# Patient Record
Sex: Male | Born: 1960
Health system: Southern US, Community
[De-identification: ages and names within clinical notes are randomized; demographics above are authoritative.]

## PROBLEM LIST (undated history)

## (undated) DIAGNOSIS — S0590XA Unspecified injury of unspecified eye and orbit, initial encounter: Secondary | ICD-10-CM

## (undated) DIAGNOSIS — M1990 Unspecified osteoarthritis, unspecified site: Secondary | ICD-10-CM

## (undated) DIAGNOSIS — I2510 Atherosclerotic heart disease of native coronary artery without angina pectoris: Secondary | ICD-10-CM

## (undated) DIAGNOSIS — E119 Type 2 diabetes mellitus without complications: Secondary | ICD-10-CM

## (undated) DIAGNOSIS — F1910 Other psychoactive substance abuse, uncomplicated: Secondary | ICD-10-CM

## (undated) DIAGNOSIS — N183 Chronic kidney disease, stage 3 unspecified: Secondary | ICD-10-CM

## (undated) DIAGNOSIS — Z8639 Personal history of other endocrine, nutritional and metabolic disease: Secondary | ICD-10-CM

## (undated) DIAGNOSIS — R001 Bradycardia, unspecified: Secondary | ICD-10-CM

## (undated) DIAGNOSIS — I499 Cardiac arrhythmia, unspecified: Secondary | ICD-10-CM

## (undated) DIAGNOSIS — I10 Essential (primary) hypertension: Secondary | ICD-10-CM

## (undated) DIAGNOSIS — E785 Hyperlipidemia, unspecified: Secondary | ICD-10-CM

## (undated) DIAGNOSIS — C801 Malignant (primary) neoplasm, unspecified: Secondary | ICD-10-CM

## (undated) DIAGNOSIS — Z9081 Acquired absence of spleen: Secondary | ICD-10-CM

## (undated) DIAGNOSIS — Z9221 Personal history of antineoplastic chemotherapy: Secondary | ICD-10-CM

## (undated) DIAGNOSIS — G510 Bell's palsy: Secondary | ICD-10-CM

## (undated) DIAGNOSIS — G709 Myoneural disorder, unspecified: Secondary | ICD-10-CM

## (undated) DIAGNOSIS — G629 Polyneuropathy, unspecified: Secondary | ICD-10-CM

## (undated) HISTORY — PX: EYE SURGERY: SHX253

---

## 1999-05-02 ENCOUNTER — Emergency Department (HOSPITAL_COMMUNITY): Admission: EM | Admit: 1999-05-02 | Discharge: 1999-05-02 | Payer: Self-pay | Admitting: Emergency Medicine

## 1999-08-03 ENCOUNTER — Emergency Department (HOSPITAL_COMMUNITY): Admission: EM | Admit: 1999-08-03 | Discharge: 1999-08-03 | Payer: Self-pay | Admitting: Emergency Medicine

## 2000-12-16 ENCOUNTER — Emergency Department (HOSPITAL_COMMUNITY): Admission: EM | Admit: 2000-12-16 | Discharge: 2000-12-16 | Payer: Self-pay | Admitting: Emergency Medicine

## 2002-07-04 ENCOUNTER — Encounter: Payer: Self-pay | Admitting: Emergency Medicine

## 2002-07-04 ENCOUNTER — Emergency Department (HOSPITAL_COMMUNITY): Admission: EM | Admit: 2002-07-04 | Discharge: 2002-07-04 | Payer: Self-pay | Admitting: Emergency Medicine

## 2006-02-06 ENCOUNTER — Inpatient Hospital Stay (HOSPITAL_COMMUNITY): Admission: AD | Admit: 2006-02-06 | Discharge: 2006-02-09 | Payer: Self-pay | Admitting: Internal Medicine

## 2006-02-07 ENCOUNTER — Encounter (INDEPENDENT_AMBULATORY_CARE_PROVIDER_SITE_OTHER): Payer: Self-pay | Admitting: Cardiology

## 2008-08-16 ENCOUNTER — Ambulatory Visit: Payer: Self-pay | Admitting: Internal Medicine

## 2008-08-16 ENCOUNTER — Inpatient Hospital Stay (HOSPITAL_COMMUNITY): Admission: EM | Admit: 2008-08-16 | Discharge: 2008-08-19 | Payer: Self-pay | Admitting: Family Medicine

## 2010-02-14 ENCOUNTER — Emergency Department (HOSPITAL_COMMUNITY): Admission: EM | Admit: 2010-02-14 | Discharge: 2010-02-15 | Payer: Self-pay | Admitting: Emergency Medicine

## 2010-03-01 ENCOUNTER — Encounter (HOSPITAL_BASED_OUTPATIENT_CLINIC_OR_DEPARTMENT_OTHER): Admission: RE | Admit: 2010-03-01 | Discharge: 2010-05-30 | Payer: Self-pay | Admitting: Internal Medicine

## 2010-04-09 ENCOUNTER — Ambulatory Visit (HOSPITAL_COMMUNITY): Admission: RE | Admit: 2010-04-09 | Discharge: 2010-04-09 | Payer: Self-pay | Admitting: Internal Medicine

## 2010-08-08 DIAGNOSIS — C801 Malignant (primary) neoplasm, unspecified: Secondary | ICD-10-CM

## 2010-08-08 HISTORY — DX: Malignant (primary) neoplasm, unspecified: C80.1

## 2010-10-23 LAB — GLUCOSE, CAPILLARY: Glucose-Capillary: 94 mg/dL (ref 70–99)

## 2010-10-24 LAB — GLUCOSE, CAPILLARY: Glucose-Capillary: 118 mg/dL — ABNORMAL HIGH (ref 70–99)

## 2010-11-09 ENCOUNTER — Emergency Department (HOSPITAL_COMMUNITY)
Admission: EM | Admit: 2010-11-09 | Discharge: 2010-11-09 | Disposition: A | Discharge status: Home / Self Care | Payer: Medicaid Other | Attending: Emergency Medicine | Admitting: Emergency Medicine

## 2010-11-09 DIAGNOSIS — E119 Type 2 diabetes mellitus without complications: Secondary | ICD-10-CM | POA: Insufficient documentation

## 2010-11-09 DIAGNOSIS — K6289 Other specified diseases of anus and rectum: Secondary | ICD-10-CM | POA: Insufficient documentation

## 2010-11-09 DIAGNOSIS — K602 Anal fissure, unspecified: Secondary | ICD-10-CM | POA: Insufficient documentation

## 2010-11-09 DIAGNOSIS — K648 Other hemorrhoids: Secondary | ICD-10-CM | POA: Insufficient documentation

## 2010-11-09 DIAGNOSIS — I10 Essential (primary) hypertension: Secondary | ICD-10-CM | POA: Insufficient documentation

## 2010-11-22 LAB — URINALYSIS, ROUTINE W REFLEX MICROSCOPIC
Bilirubin Urine: NEGATIVE
Hgb urine dipstick: NEGATIVE
Leukocytes, UA: NEGATIVE
Nitrite: NEGATIVE
Specific Gravity, Urine: 1.027 (ref 1.005–1.030)
pH: 6 (ref 5.0–8.0)

## 2010-11-22 LAB — RAPID URINE DRUG SCREEN, HOSP PERFORMED
Barbiturates: NOT DETECTED
Benzodiazepines: NOT DETECTED
Cocaine: POSITIVE — AB
Opiates: POSITIVE — AB
Tetrahydrocannabinol: NOT DETECTED

## 2010-11-22 LAB — CBC
HCT: 41.8 % (ref 39.0–52.0)
HCT: 47.7 % (ref 39.0–52.0)
Hemoglobin: 14.5 g/dL (ref 13.0–17.0)
Hemoglobin: 16.2 g/dL (ref 13.0–17.0)
MCHC: 34.7 g/dL (ref 30.0–36.0)
Platelets: 145 10*3/uL — ABNORMAL LOW (ref 150–400)
Platelets: 171 10*3/uL (ref 150–400)
RDW: 14.1 % (ref 11.5–15.5)
WBC: 3.6 10*3/uL — ABNORMAL LOW (ref 4.0–10.5)
WBC: 3.9 10*3/uL — ABNORMAL LOW (ref 4.0–10.5)

## 2010-11-22 LAB — BASIC METABOLIC PANEL
BUN: 17 mg/dL (ref 6–23)
BUN: 20 mg/dL (ref 6–23)
CO2: 29 mEq/L (ref 19–32)
Calcium: 9.2 mg/dL (ref 8.4–10.5)
Chloride: 100 mEq/L (ref 96–112)
Chloride: 99 mEq/L (ref 96–112)
Creatinine, Ser: 1.66 mg/dL — ABNORMAL HIGH (ref 0.4–1.5)
Creatinine, Ser: 1.75 mg/dL — ABNORMAL HIGH (ref 0.4–1.5)
GFR calc Af Amer: 51 mL/min — ABNORMAL LOW (ref >60)
GFR calc Af Amer: 52 mL/min — ABNORMAL LOW (ref >60)
GFR calc Af Amer: 54 mL/min — ABNORMAL LOW (ref >60)
GFR calc non Af Amer: 42 mL/min — ABNORMAL LOW (ref >60)
GFR calc non Af Amer: 43 mL/min — ABNORMAL LOW (ref >60)
Potassium: 3.6 mEq/L (ref 3.5–5.1)
Potassium: 3.7 mEq/L (ref 3.5–5.1)
Potassium: 3.8 mEq/L (ref 3.5–5.1)
Sodium: 136 mEq/L (ref 135–145)
Sodium: 137 mEq/L (ref 135–145)
Sodium: 138 mEq/L (ref 135–145)

## 2010-11-22 LAB — DIFFERENTIAL
Basophils Absolute: 0 10*3/uL (ref 0.0–0.1)
Basophils Relative: 1 % (ref 0–1)
Eosinophils Absolute: 0.1 10*3/uL (ref 0.0–0.7)
Eosinophils Relative: 2 % (ref 0–5)
Lymphs Abs: 0.9 10*3/uL (ref 0.7–4.0)
Monocytes Absolute: 0.3 10*3/uL (ref 0.1–1.0)
Neutro Abs: 2.6 10*3/uL (ref 1.7–7.7)
Neutrophils Relative %: 67 % (ref 43–77)

## 2010-11-22 LAB — COMPREHENSIVE METABOLIC PANEL
ALT: 16 U/L (ref 0–53)
Albumin: 3.8 g/dL (ref 3.5–5.2)
Alkaline Phosphatase: 40 U/L (ref 39–117)
BUN: 14 mg/dL (ref 6–23)
Chloride: 101 mEq/L (ref 96–112)
Glucose, Bld: 107 mg/dL — ABNORMAL HIGH (ref 70–99)
Potassium: 3.3 mEq/L — ABNORMAL LOW (ref 3.5–5.1)
Sodium: 138 mEq/L (ref 135–145)
Total Bilirubin: 1 mg/dL (ref 0.3–1.2)

## 2010-11-22 LAB — CARDIAC PANEL(CRET KIN+CKTOT+MB+TROPI)
CK, MB: 2 ng/mL (ref 0.3–4.0)
Relative Index: 1.4 (ref 0.0–2.5)
Troponin I: 0.02 ng/mL (ref 0.00–0.06)

## 2010-11-22 LAB — URINE MICROSCOPIC-ADD ON

## 2010-11-22 LAB — HEPATITIS PANEL, ACUTE: HCV Ab: NEGATIVE

## 2010-11-22 LAB — RAPID STREP SCREEN (MED CTR MEBANE ONLY): Streptococcus, Group A Screen (Direct): NEGATIVE

## 2010-11-22 LAB — CK TOTAL AND CKMB (NOT AT ARMC): Relative Index: 1.5 (ref 0.0–2.5)

## 2010-11-22 LAB — HEMOGLOBIN A1C
Hgb A1c MFr Bld: 6.1 % (ref 4.6–6.1)
Mean Plasma Glucose: 128 mg/dL

## 2010-11-22 LAB — TROPONIN I: Troponin I: 0.01 ng/mL (ref 0.00–0.06)

## 2010-11-22 LAB — POCT CARDIAC MARKERS: CKMB, poc: 1.7 ng/mL (ref 1.0–8.0)

## 2010-11-30 ENCOUNTER — Emergency Department (HOSPITAL_COMMUNITY): Payer: Medicaid Other

## 2010-11-30 ENCOUNTER — Inpatient Hospital Stay (HOSPITAL_COMMUNITY)
Admission: EM | Admit: 2010-11-30 | Discharge: 2010-12-04 | DRG: 824 | Disposition: A | Discharge status: Home Health | Payer: Medicaid Other | Attending: Internal Medicine | Admitting: Internal Medicine

## 2010-11-30 ENCOUNTER — Encounter (HOSPITAL_COMMUNITY): Payer: Self-pay | Admitting: Radiology

## 2010-11-30 DIAGNOSIS — N179 Acute kidney failure, unspecified: Secondary | ICD-10-CM | POA: Diagnosis present

## 2010-11-30 DIAGNOSIS — G510 Bell's palsy: Secondary | ICD-10-CM | POA: Diagnosis present

## 2010-11-30 DIAGNOSIS — D47Z9 Other specified neoplasms of uncertain behavior of lymphoid, hematopoietic and related tissue: Principal | ICD-10-CM | POA: Diagnosis present

## 2010-11-30 DIAGNOSIS — I129 Hypertensive chronic kidney disease with stage 1 through stage 4 chronic kidney disease, or unspecified chronic kidney disease: Secondary | ICD-10-CM | POA: Diagnosis present

## 2010-11-30 DIAGNOSIS — E119 Type 2 diabetes mellitus without complications: Secondary | ICD-10-CM | POA: Diagnosis present

## 2010-11-30 DIAGNOSIS — D649 Anemia, unspecified: Secondary | ICD-10-CM | POA: Diagnosis present

## 2010-11-30 DIAGNOSIS — N183 Chronic kidney disease, stage 3 unspecified: Secondary | ICD-10-CM | POA: Diagnosis present

## 2010-11-30 DIAGNOSIS — D61818 Other pancytopenia: Secondary | ICD-10-CM | POA: Diagnosis present

## 2010-11-30 DIAGNOSIS — D696 Thrombocytopenia, unspecified: Secondary | ICD-10-CM | POA: Diagnosis present

## 2010-11-30 DIAGNOSIS — E669 Obesity, unspecified: Secondary | ICD-10-CM | POA: Diagnosis present

## 2010-11-30 DIAGNOSIS — R7989 Other specified abnormal findings of blood chemistry: Secondary | ICD-10-CM | POA: Diagnosis present

## 2010-11-30 DIAGNOSIS — Z91199 Patient's noncompliance with other medical treatment and regimen due to unspecified reason: Secondary | ICD-10-CM

## 2010-11-30 DIAGNOSIS — F1410 Cocaine abuse, uncomplicated: Secondary | ICD-10-CM | POA: Diagnosis present

## 2010-11-30 DIAGNOSIS — Z9119 Patient's noncompliance with other medical treatment and regimen: Secondary | ICD-10-CM

## 2010-11-30 HISTORY — DX: Essential (primary) hypertension: I10

## 2010-11-30 HISTORY — DX: Bell's palsy: G51.0

## 2010-11-30 LAB — POCT CARDIAC MARKERS
CKMB, poc: <1 ng/mL — ABNORMAL LOW (ref 1.0–8.0)
Myoglobin, poc: 60.9 ng/mL (ref 12–200)
Troponin i, poc: <0.05 ng/mL (ref 0.00–0.09)

## 2010-11-30 LAB — DIFFERENTIAL
Band Neutrophils: 0 % (ref 0–10)
Basophils Absolute: 0 10*3/uL (ref 0.0–0.1)
Basophils Relative: 0 % (ref 0–1)
Eosinophils Absolute: 0 10*3/uL (ref 0.0–0.7)
Lymphocytes Relative: 99 % — ABNORMAL HIGH (ref 12–46)
Lymphs Abs: 19.5 10*3/uL — ABNORMAL HIGH (ref 0.7–4.0)
Monocytes Absolute: 0 10*3/uL — ABNORMAL LOW (ref 0.1–1.0)
Myelocytes: 0 %
Promyelocytes Absolute: 0 %

## 2010-11-30 LAB — PATHOLOGIST SMEAR REVIEW

## 2010-11-30 LAB — CBC
HCT: 22 % — ABNORMAL LOW (ref 39.0–52.0)
Platelets: 61 10*3/uL — ABNORMAL LOW (ref 150–400)
RDW: 20.1 % — ABNORMAL HIGH (ref 11.5–15.5)
WBC: 19.7 10*3/uL — ABNORMAL HIGH (ref 4.0–10.5)

## 2010-11-30 LAB — COMPREHENSIVE METABOLIC PANEL
ALT: 10 U/L (ref 0–53)
Alkaline Phosphatase: 54 U/L (ref 39–117)
BUN: 26 mg/dL — ABNORMAL HIGH (ref 6–23)
CO2: 25 mEq/L (ref 19–32)
Calcium: 9 mg/dL (ref 8.4–10.5)
Chloride: 105 mEq/L (ref 96–112)
Glucose, Bld: 94 mg/dL (ref 70–99)
Potassium: 4.6 mEq/L (ref 3.5–5.1)
Total Bilirubin: 0.9 mg/dL (ref 0.3–1.2)

## 2010-11-30 LAB — ABO/RH: ABO/RH(D): A POS

## 2010-11-30 LAB — D-DIMER, QUANTITATIVE: D-Dimer, Quant: 13.37 ug/mL-FEU — ABNORMAL HIGH (ref 0.00–0.48)

## 2010-11-30 LAB — LIPASE, BLOOD: Lipase: 42 U/L (ref 11–59)

## 2010-12-01 ENCOUNTER — Inpatient Hospital Stay (HOSPITAL_COMMUNITY): Payer: Medicaid Other

## 2010-12-01 DIAGNOSIS — C8589 Other specified types of non-Hodgkin lymphoma, extranodal and solid organ sites: Secondary | ICD-10-CM

## 2010-12-01 LAB — BASIC METABOLIC PANEL
BUN: 27 mg/dL — ABNORMAL HIGH (ref 6–23)
Calcium: 9.5 mg/dL (ref 8.4–10.5)
Chloride: 103 mEq/L (ref 96–112)
Creatinine, Ser: 2.64 mg/dL — ABNORMAL HIGH (ref 0.4–1.5)
GFR calc Af Amer: 31 mL/min — ABNORMAL LOW (ref >60)
GFR calc non Af Amer: 26 mL/min — ABNORMAL LOW (ref >60)
Sodium: 139 mEq/L (ref 135–145)

## 2010-12-01 LAB — LACTATE DEHYDROGENASE: LDH: 536 U/L — ABNORMAL HIGH (ref 94–250)

## 2010-12-01 LAB — URINE MICROSCOPIC-ADD ON

## 2010-12-01 LAB — HEPATITIS B SURFACE ANTIBODY,QUALITATIVE: Hep B S Ab: POSITIVE — AB

## 2010-12-01 LAB — HIV ANTIBODY (ROUTINE TESTING W REFLEX): HIV: NONREACTIVE

## 2010-12-01 LAB — VITAMIN B12: Vitamin B-12: 948 pg/mL — ABNORMAL HIGH (ref 211–911)

## 2010-12-01 LAB — URINALYSIS, ROUTINE W REFLEX MICROSCOPIC
Bilirubin Urine: NEGATIVE
Hgb urine dipstick: NEGATIVE
Nitrite: NEGATIVE
Specific Gravity, Urine: 1.02 (ref 1.005–1.030)
Urobilinogen, UA: 0.2 mg/dL (ref 0.0–1.0)
pH: 5.5 (ref 5.0–8.0)

## 2010-12-01 LAB — PROTEIN, URINE, RANDOM: Total Protein, Urine: 89 mg/dL

## 2010-12-01 LAB — DRUGS OF ABUSE SCREEN W/O ALC, ROUTINE URINE
Amphetamine Screen, Ur: NEGATIVE
Benzodiazepines.: NEGATIVE
Cocaine Metabolites: POSITIVE — AB
Creatinine,U: 110.8 mg/dL
Marijuana Metabolite: NEGATIVE
Propoxyphene: NEGATIVE

## 2010-12-01 LAB — FERRITIN: Ferritin: 81 ng/mL (ref 22–322)

## 2010-12-01 LAB — CBC
MCH: 24.8 pg — ABNORMAL LOW (ref 26.0–34.0)
MCHC: 30.7 g/dL (ref 30.0–36.0)
MCV: 80.9 fL (ref 78.0–100.0)
Platelets: 53 10*3/uL — ABNORMAL LOW (ref 150–400)
RBC: 2.62 MIL/uL — ABNORMAL LOW (ref 4.22–5.81)
RDW: 20.2 % — ABNORMAL HIGH (ref 11.5–15.5)

## 2010-12-01 LAB — IRON AND TIBC
Iron: 32 ug/dL — ABNORMAL LOW (ref 42–135)
Saturation Ratios: 10 % — ABNORMAL LOW (ref 20–55)
TIBC: 334 ug/dL (ref 215–435)

## 2010-12-01 LAB — HAPTOGLOBIN: Haptoglobin: 36 mg/dL (ref 16–200)

## 2010-12-01 LAB — SODIUM, URINE, RANDOM: Sodium, Ur: 115 mEq/L

## 2010-12-01 NOTE — H&P (Signed)
NAME:  Cory Marks, Cory Marks NO.:  [id number]  MEDICAL RECORD NO.:  [id number]           PATIENT TYPE:  E  LOCATION:  [facility]                         FACILITY:  [facility]  PHYSICIAN:  Mariea Stable, MD   DATE OF BIRTH:  1961-04-18  DATE OF ADMISSION:  11/30/2010 DATE OF DISCHARGE:                             HISTORY & PHYSICAL   PRIMARY CARE PHYSICIAN:  Dr. Concepcion Elk.  CHIEF COMPLAINT:  Fatigue, shortness of breath, and bloating.  HISTORY OF PRESENT ILLNESS:  Cory Marks is a 50 year old gentleman with past medical history significant for right neck lymphadenopathy, noted on a discharge summary August 19, 2008, where he was recommended a followup with biopsy given this was a 2-cm firm lymph node with concern for malignancy, who presents with chief complaints noted above.  The patient states that over the last approximately 2 months, he has had a progressive increase in bloating and abdominal girth, increase fatigue along with shortness of breath more pronounced with exertion.  The patient had not had a primary care physician due to lack of insurance. His mother states that given this right-sided lymphadenopathy that has been progressively increasing in size since the time mentioned above. She got him an appointment with an ENT doctor approximately January 2012.  Finally, today the patient had set up an appointment to see his primary care physician for the first time.  Upon visiting the PCP, they recommended that he come to the emergency department for further evaluation.  Upon review of systems, the patient has had a significant weight loss, early satiety, lymphadenopathy noted in the bilateral inguinal areas.  PAST MEDICAL HISTORY: 1. Hypertension. 2. Renal insufficiency. 3. Cocaine abuse.  MEDICATIONS:  None.  ALLERGIES:  No known drug allergies.  SOCIAL HISTORY:  The patient is currently living with his parents, [place] and [place], home phone number  [phone].  He denies any tobacco, alcohol, or drug use, though there is a history of cocaine use and cocaine positivity on a urine drug screen in the past in the EMR.  FAMILY HISTORY:  Positive for diabetes, hypertension, and coronary artery disease.  REVIEW OF SYSTEMS:  As per HPI.  All other systems reviewed negative.  PHYSICAL EXAM:  VITAL SIGNS:  Temperature 98.4, blood pressure 142/87, heart rate of 98, respirations 24, oxygen saturation 97% on room. GENERAL:  This is a middle-aged man, sitting at the site of the bed, in no acute distress. HEENT:  Head is normocephalic, atraumatic.  Pupils are equally round and reactive to light.  Extraocular movements are intact.  Sclerae are anicteric.  There is conjunctival pallor.  Mucous membranes are dry. There is no oropharyngeal lesions. NECK:  Supple.  There is a large notable lymph node at the angle of the jaw on the right side that is slightly mobile, but firm.  There are also right-sided preauricular nodes as well as posterior auricular nodes, on the right side.  There are some other smaller lymph nodes noted in the posterior cervical chain on the right.  The patient does have right- sided axillary nodes that are more prominent than a few small once on the  left side.  There are bilateral inguinal lymph nodes that are clearly palpable. LUNGS:  Good air movement bilaterally.  It is clear to auscultation. HEART:  There is a normal S1-S2 with a regular rate and rhythm.  There is a grade 2/6 systolic murmur over the outflow tract.  There are no gallops or rubs. ABDOMEN:  Visibly distended.  Bowel sounds are present.  Abdomen is very tight and firm to palpation.  It appears that this swelling encompasses the entire left upper and lower quadrants and all the way down to the right lower quadrant.  The only areas that can be palpated beyond this right upper and extreme right lower quadrant. EXTREMITIES:  There is +2 pitting edema  bilaterally. NEUROLOGIC:  The patient is awake, alert, and oriented x3.  Cranial nerves II-XII are intact.  Motor is intact.  Sensation is intact.  Gait is normal.  LABORATORY DATA:  Point-of-care cardiac enzymes are negative.  PT 15.0, INR 1.16, PTT 32.  Complete metabolic panel is within normal limits except for a BUN of 26 and a creatinine of 2.51.  Lipase is normal at 42.  WBC 19.7 with 99% lymphocytes, hemoglobin of 6.8 with an MCV of 80, and platelets of 61.  D-dimer 13.37.  IMAGING: 1. Chest x-ray, impression, cardiomegaly with central vascular     engorgement.  No acute findings or definite adenopathy. 2. Abdominal x-ray, impression, suspicion of large left abdominal     mass, possibly splenomegaly or less likely asymmetric ascites. 3. CT abdomen and pelvis with oral contrast, impression, masses,     splenomegaly, abdominal and pelvic lymphadenopathy consistent with     lymphoma, small amount of abdominal ascites, 0.7 cm right lower     lobe pulmonary nodule.  ASSESSMENT AND PLAN: 1. Likely lymphoma.  The patient's symptoms were all consistent with     the laboratory data and imaging data mentioned above.  The patient     has diffuse lymphadenopathy on exam along with massive splenomegaly     on exam and CT scan.  He also has a markedly predominant     lymphocytosis with anemia and thrombocytopenia.  At this point, we     will admit to a regular floor.  We will order further basic workup     including LDH, beta-2 microglobulin, HIV, hepatitis B and C     serologies, anemia panel and haptoglobin.  More importantly, the     patient will need Hematology/Oncology consultation and will need     tissue biopsy of one of the lymph nodes along with a bone marrow     biopsy.  We will discuss with Hematology/Oncology for possible     consultation in the morning.  We will likely also need flow     cytometry of peripheral blood giving lymphocytosis that this may     provide  diagnosis. 2. Hypertension.  The patient currently is mildly hypertensive.  He is     not on any medications and therefore we will just continue to     observe at this point. 3. Renal insufficiency.  The patient has a history of chronic renal     insufficiency with the last creatinine of 1.7 in January 2010 that     is 2 years ago.  His current creatinine is 2.5, which is likely     related to uncontrolled hypertension plus or minus problem #1. 4. Elevated D-dimer.  This is secondary to problem #1.  We will not  pursue any further imaging for pulmonary     embolus.  This was done due to his dyspnea on exertion prior to all     the other information being available. 5. Code status:  The patient is a full code.  In case of any     emergency, his parents either [place] or [place] listed above     should be contacted.     Mariea Stable, MD     MA/MEDQ  D:  11/30/2010  T:  11/30/2010  Job:  [id number]  cc:   Fleet Contras, M.D.  Electronically Signed by Mariea Stable MD on 12/01/2010 02:45:41 PM

## 2010-12-02 ENCOUNTER — Other Ambulatory Visit: Payer: Self-pay | Admitting: Otolaryngology

## 2010-12-02 ENCOUNTER — Other Ambulatory Visit (HOSPITAL_COMMUNITY): Payer: Self-pay

## 2010-12-02 LAB — CBC
HCT: 24 % — ABNORMAL LOW (ref 39.0–52.0)
Hemoglobin: 7.4 g/dL — ABNORMAL LOW (ref 13.0–17.0)
MCH: 25 pg — ABNORMAL LOW (ref 26.0–34.0)
MCHC: 30.8 g/dL (ref 30.0–36.0)
Platelets: 57 10*3/uL — ABNORMAL LOW (ref 150–400)
RDW: 19.8 % — ABNORMAL HIGH (ref 11.5–15.5)

## 2010-12-02 LAB — BETA 2 MICROGLOBULIN, SERUM: Beta-2 Microglobulin: 9.3 mg/L — ABNORMAL HIGH (ref 1.01–1.73)

## 2010-12-02 LAB — RENAL FUNCTION PANEL
CO2: 26 mEq/L (ref 19–32)
Calcium: 9.3 mg/dL (ref 8.4–10.5)
Creatinine, Ser: 2.63 mg/dL — ABNORMAL HIGH (ref 0.4–1.5)
GFR calc non Af Amer: 26 mL/min — ABNORMAL LOW (ref >60)
Glucose, Bld: 102 mg/dL — ABNORMAL HIGH (ref 70–99)
Phosphorus: 3.7 mg/dL (ref 2.3–4.6)
Sodium: 136 mEq/L (ref 135–145)

## 2010-12-03 ENCOUNTER — Other Ambulatory Visit: Payer: Self-pay | Admitting: Interventional Radiology

## 2010-12-03 ENCOUNTER — Inpatient Hospital Stay (HOSPITAL_COMMUNITY): Payer: Medicaid Other

## 2010-12-03 LAB — URINE CULTURE
Colony Count: NO GROWTH
Culture  Setup Time: 201204261724
Culture: NO GROWTH
Special Requests: NEGATIVE

## 2010-12-03 LAB — PREPARE RBC (CROSSMATCH)

## 2010-12-03 LAB — BASIC METABOLIC PANEL
BUN: 32 mg/dL — ABNORMAL HIGH (ref 6–23)
CO2: 25 mEq/L (ref 19–32)
Chloride: 101 mEq/L (ref 96–112)
GFR calc non Af Amer: 24 mL/min — ABNORMAL LOW (ref >60)
Glucose, Bld: 99 mg/dL (ref 70–99)
Potassium: 4.5 mEq/L (ref 3.5–5.1)
Sodium: 135 mEq/L (ref 135–145)

## 2010-12-03 LAB — CBC
HCT: 21.7 % — ABNORMAL LOW (ref 39.0–52.0)
HCT: 25 % — ABNORMAL LOW (ref 39.0–52.0)
Hemoglobin: 6.8 g/dL — CL (ref 13.0–17.0)
MCH: 25.3 pg — ABNORMAL LOW (ref 26.0–34.0)
MCHC: 31.6 g/dL (ref 30.0–36.0)
MCV: 80.7 fL (ref 78.0–100.0)
Platelets: 54 10*3/uL — ABNORMAL LOW (ref 150–400)
Platelets: 48 10*3/uL — ABNORMAL LOW (ref 150–400)
RBC: 2.69 MIL/uL — ABNORMAL LOW (ref 4.22–5.81)
RDW: 19.1 % — ABNORMAL HIGH (ref 11.5–15.5)
WBC: 17.7 10*3/uL — ABNORMAL HIGH (ref 4.0–10.5)
WBC: 13.8 10*3/uL — ABNORMAL HIGH (ref 4.0–10.5)

## 2010-12-04 LAB — CBC
HCT: 28.4 % — ABNORMAL LOW (ref 39.0–52.0)
Hemoglobin: 8.9 g/dL — ABNORMAL LOW (ref 13.0–17.0)
MCHC: 31.3 g/dL (ref 30.0–36.0)
MCV: 81.6 fL (ref 78.0–100.0)
Platelets: 56 10*3/uL — ABNORMAL LOW (ref 150–400)
RBC: 3.48 MIL/uL — ABNORMAL LOW (ref 4.22–5.81)
WBC: 12.9 10*3/uL — ABNORMAL HIGH (ref 4.0–10.5)

## 2010-12-04 LAB — CROSSMATCH
Antibody Screen: NEGATIVE
Unit division: 0
Unit division: 0
Unit division: 0

## 2010-12-06 LAB — COCAINE, URINE, CONFIRMATION: Benzoylecgonine GC/MS Conf: 18964 NG/ML — ABNORMAL HIGH

## 2010-12-06 NOTE — Discharge Summary (Signed)
NAME:  Cory Marks, Cory Marks NO.:  [id number]  MEDICAL RECORD NO.:  [id number]           PATIENT TYPE:  I  LOCATION:  5501                         FACILITY:  [facility]  PHYSICIAN:  Marinda Elk, M.D.DATE OF BIRTH:  13-May-1961  DATE OF ADMISSION:  11/30/2010 DATE OF DISCHARGE:                              DISCHARGE SUMMARY   PRIMARY CARE DOCTOR:  Fleet Contras, MD  ONCOLOGIST:  Laurice Record, MD  DISCHARGE DIAGNOSES: 1. Extensive lymphadenopathy, most likely secondary to     lymphoproliferative disorder. 2. Hyperuricemia. 3. Fever. 4. Chronic kidney disease stage III. 5. Thrombocytopenia. 6. Diabetes. 7. Hyperglycemia.  DISCHARGE MEDICATIONS: 1. Tylenol 650 mg q.6 h. p.r.n. for fevers. 2. Allopurinol 100 mg daily. 3. Zofran 4 mg q.6 h. P.r.n. 4. Vicodin 5/500 mg q.6 h. P.r.n. 5. Ambien 5 mg at bedtime. 6. Lasix 20 mg p.o. daily.  PROCEDURES PERFORMED: 1. Marrow biopsy which results are pending.2. Right neck lymphadenopathy, incisional biopsy of the right parotid.  IMAGING:  Renal ultrasound that showed renal parenchyma slightly echogenic bilaterally, consistent with intrinsic renal disease.  No hydronephrosis.  CT scan of the head that showed periventricular corona radiata white matter, no intracranial masses.  CT neck showed bilateral cervical lymphadenopathy, left greater than right, supraclavicular lobe, prominent right parotid gland, raises suspicion for tumor involvement. CT chest showed pathologic mediastinal lymphadenopathy, suspect hilar lymphadenopathy.  The blood pool is less dense in the myocardium suspicious for anemia.  There are small subpleural nodules on the right, probably represent subpleural lymph nodes, subsegmental atelectasis, severe splenomegaly.  CT scan of the abdomen and pelvis shows massive splenomegaly with lymphadenopathy, small amount of ascites, 0.7-cm right lower lobe nodule.  Chest x-ray showed cardiomegaly with  central vascular congestion.  Abdominal x-ray showed suspicious large mass.  CONSULTANTS:  Dr. Ezzard Standing, ENT; Dr. Terrial Rhodes, Renal; and Dr. Dalene Carrow, Oncology.  BRIEF ADMITTING HISTORY AND PHYSICAL:  This is a 50 year old man with past medical history of right neck lymphadenopathy, discharged on August 19, 2010, was recommended to follow up for biopsy on his neck which he did not.  Now, he presents with a chief complaint of this.  The patient stated that over the last 2 months, it has progressively gotten worse with abdominal girth, increased fatigue, shortness of breath, more pronounced on exertion.  The patient had not had a primary care physician due to lack of insurance, so we were asked to admit him to further evaluate.  Please refer to dictation from November 30, 2010, for further details.  LABS ON ADMISSION:  Shows point-of-care cardiac markers negative x1. His PT is 15, INR 1.1.  His sodium is 136, potassium 4.6, chloride 105, bicarb 25, glucose of 94, BUN of 26, creatinine 2.5.  LFTs within normal limits.  His lipase is 42.  Pathologic smear showed atypical absolute lymphocytosis, very bothersome lymphoproliferative disorder suggests lymphoma.  White count of 19.7, hemoglobin of 6.8, platelet count of 61. His D-dimer was 13.  His LDH was 536.  His uric acid was 10.  HOSPITAL COURSE: 1. Extensive lymphadenopathy, most likely secondary to     lymphoproliferative disorder.  ENT was consulted.  They did a     biopsy.  Oncology was consulted who recommended bone marrow biopsy.     This was done, which results are pending.  He will follow up with     Oncology on Dec 07, 2010, for results and starting chemotherapy.     Pathology report of the biopsy of the neck and the bone marrow is     pending at the time of this dictation.  This will be followed up     with Dr. Dalene Carrow. 2. Hyperuricemia.  He was started on allopurinol. 3. Fever, most likely B-symptoms secondary to his  lymphoproliferative     disorder.  He will take Tylenol for this. 4. Chronic kidney disease stage III, probably secondary to poorly-     controlled hypertension and diabetes.  Renal is on board.  They     will follow up as an outpatient. 5. Cytopenia secondary to #2. 6. Diabetes, currently well controlled in the hospital.  No further     changes.  He will continue on his regular foods at this time.  He     will follow up with Dr. Dalene Carrow.  We will make any further changes     as needed.  Labs on the day of discharge show sodium 135, potassium 4.5, chloride 101, bicarb 25, glucose of 99, BUN of 32, creatinine 8.2.  His beta-2 microglobulin is 9.3.  his MRSA PCR screening is negative.  His uric acid is 10.     Marinda Elk, M.D.     AF/MEDQ  D:  12/03/2010  T:  12/04/2010  Job:  [id number]  cc:   Laurice Record, M.D. Fleet Contras, M.D.  Electronically Signed by Marinda Elk M.D. on 12/06/2010 02:32:05 PM

## 2010-12-08 ENCOUNTER — Other Ambulatory Visit: Payer: Self-pay | Admitting: Hematology and Oncology

## 2010-12-08 ENCOUNTER — Encounter (HOSPITAL_BASED_OUTPATIENT_CLINIC_OR_DEPARTMENT_OTHER): Payer: Self-pay | Admitting: Hematology and Oncology

## 2010-12-08 DIAGNOSIS — C8319 Mantle cell lymphoma, extranodal and solid organ sites: Secondary | ICD-10-CM

## 2010-12-08 DIAGNOSIS — C8590 Non-Hodgkin lymphoma, unspecified, unspecified site: Secondary | ICD-10-CM

## 2010-12-08 DIAGNOSIS — C8589 Other specified types of non-Hodgkin lymphoma, extranodal and solid organ sites: Secondary | ICD-10-CM

## 2010-12-08 LAB — CBC WITH DIFFERENTIAL/PLATELET
BASO%: 0.4 % (ref 0.0–2.0)
Basophils Absolute: 0 10*3/uL (ref 0.0–0.1)
Eosinophils Absolute: 0 10*3/uL (ref 0.0–0.5)
HGB: 9.1 g/dL — ABNORMAL LOW (ref 13.0–17.1)
LYMPH%: 93.6 % — ABNORMAL HIGH (ref 14.0–49.0)
MCHC: 33.1 g/dL (ref 32.0–36.0)
NEUT#: 0.4 10*3/uL — CL (ref 1.5–6.5)
Platelets: 62 10*3/uL — ABNORMAL LOW (ref 140–400)
RDW: 20.5 % — ABNORMAL HIGH (ref 11.0–14.6)
lymph#: 8.2 10*3/uL — ABNORMAL HIGH (ref 0.9–3.3)

## 2010-12-08 LAB — COMPREHENSIVE METABOLIC PANEL
Albumin: 3.3 g/dL — ABNORMAL LOW (ref 3.5–5.2)
BUN: 28 mg/dL — ABNORMAL HIGH (ref 6–23)
Calcium: 9.7 mg/dL (ref 8.4–10.5)
Chloride: 98 mEq/L (ref 96–112)
Creatinine, Ser: 2.58 mg/dL — ABNORMAL HIGH (ref 0.40–1.50)
Glucose, Bld: 96 mg/dL (ref 70–99)
Potassium: 4.8 mEq/L (ref 3.5–5.3)
Total Bilirubin: 1.5 mg/dL — ABNORMAL HIGH (ref 0.3–1.2)

## 2010-12-08 LAB — URIC ACID: Uric Acid, Serum: 9 mg/dL — ABNORMAL HIGH (ref 4.0–7.8)

## 2010-12-08 LAB — CEA: CEA: 1.9 ng/mL (ref 0.0–5.0)

## 2010-12-08 LAB — TECHNOLOGIST REVIEW

## 2010-12-09 LAB — CULTURE, BLOOD (ROUTINE X 2)
Culture  Setup Time: 201204270852
Culture: NO GROWTH
Culture: NO GROWTH

## 2010-12-13 ENCOUNTER — Encounter (HOSPITAL_COMMUNITY)
Admission: RE | Admit: 2010-12-13 | Discharge: 2010-12-13 | Disposition: A | Discharge status: Home / Self Care | Payer: Medicaid Other | Source: Ambulatory Visit | Attending: Hematology and Oncology | Admitting: Hematology and Oncology

## 2010-12-13 DIAGNOSIS — C8589 Other specified types of non-Hodgkin lymphoma, extranodal and solid organ sites: Secondary | ICD-10-CM | POA: Insufficient documentation

## 2010-12-13 DIAGNOSIS — C8590 Non-Hodgkin lymphoma, unspecified, unspecified site: Secondary | ICD-10-CM

## 2010-12-13 DIAGNOSIS — R161 Splenomegaly, not elsewhere classified: Secondary | ICD-10-CM | POA: Insufficient documentation

## 2010-12-13 MED ORDER — FLUDEOXYGLUCOSE F - 18 (FDG) INJECTION
17.1000 | Freq: Once | INTRAVENOUS | Status: AC | PRN
  Administered 2010-12-13: 17.1 via INTRAVENOUS

## 2010-12-21 NOTE — H&P (Signed)
NAME:  Cory Marks, Cory Marks NO.:  [id number]   MEDICAL RECORD NO.:  [id number]          PATIENT TYPE:  EMS   LOCATION:  [facility]                         FACILITY:  [facility]   PHYSICIAN:  Donalynn Furlong, MD      DATE OF BIRTH:  Nov 17, 1960   DATE OF ADMISSION:  08/16/2008  DATE OF DISCHARGE:                              HISTORY & PHYSICAL   PRIMARY CARE PHYSICIAN:  Thora Lance, M.D. with [facility]   CHIEF COMPLAINT:  Left lower tooth pain and elevated blood pressure.   HISTORY OF PRESENT ILLNESS:  Cory Marks is a 50 year old African  American male who lives in [place], [place] with his mother.  He presented to Urgent Care today with one day onset of left lower molar  tooth pain.  He has a history of unfinished dental work in the left  lower molar tooth which was supposed to undergo root canal surgery.  He  could not afford it, so he did not undergo the procedure.  He started  having pain around 24 to 36 hours ago before the presentation.  He  denied any fever, chills, swelling at the area in the beginning, but he  now is feeling low grade fever and mild swelling on the affected side of  face.  He denied any visual problems or throat problems, swallowing  difficulties, any neck swelling, chest pain, shortness of breath, cough,  sputum production, GI symptoms urinary symptoms at this time.  Admittedly, the patient used cocaine 2 days ago.  He frequently uses  cocaine roughly once or twice in a week.   PAST MEDICAL HISTORY:  1. Ongoing cocaine abuse.  2. Medical noncompliance.  3. Uncontrolled hypertension.  4. Uncontrolled diabetes mellitus.  5. Left-sided facial Bell's palsy.  6. Dental caries.   PAST SURGICAL HISTORY:  Left knee surgery for the trauma.   ALLERGIES:  None.   MEDICATIONS:  None.  The patient is supposed to be on antihypertensive  and diabetic medication, but he has not taken any medication for about  one year.   FAMILY  HISTORY:  Noncontributory to the current problem.   SOCIAL HISTORY:  The patient lives with his mother.  He has ongoing  cocaine and alcohol use.  Denied any smoking.   REVIEW OF SYSTEMS:  Positive as per HPI, otherwise 14 systems negative.   PHYSICAL EXAMINATION:  VITAL SIGNS:  Blood pressure 221/146, pulse 81,  respiration 15, temperature 98.5.  GENERAL:  Alert, oriented x3, lying in bed without any distress.  CARDIOVASCULAR:  S1, S3 regular.  No murmur or gallop.  LUNGS:  Clear to auscultation bilaterally.  No wheezing, rhonchi,  crackles.  ABDOMEN:  Nontender, nondistended.  Bowel sounds present.  EXTREMITIES:  No clubbing, cyanosis or edema.  Pulse palpable in all  four extremities.  HEENT:  Head:  Normocephalic, nontraumatic.  Eyes:  Pupils equally  reactive to light and accommodation.  No icterus.  Extraocular muscles  intact.  Oral cavity with left lower molar tooth with some pain and  redness in the area with tenderness also.  NECK:  No thyromegaly, JVD or lymphadenopathy.  NEUROLOGICAL:  Exam shows left-sided facial Bell's palsy.  Otherwise,  all other cranial nerves intact.  Muscular strength, sensation and  reflexes intact.  SKIN:  No rash or bruits.   LABORATORY DATA:  EKG normal sinus rhythm with normal axis, no acute ST-  T changes.  The urinalysis shows total protein more than 300, group A  strep screen for sore throat is negative.  CT head shows chronic  ischemic changes.  Chest x-ray portable shows no acute cardiopulmonary  process.  Basic metabolic panel with creatinine was 1.75 with a GFR of  51.  CBC unremarkable except WBC 3.6, one set of troponin is negative.   ASSESSMENT AND PLAN:  1. Uncontrolled left lower molar pain with possible infection with a      low grade fevers, swelling pain  2. Uncontrolled hypertension with hypertensive urgency secondary to      cocaine use and medical noncompliance  3. Uncontrolled diabetes mellitus  4. Ongoing cocaine  use  5. Medical noncompliance  6. Left-sided Bell's palsy.   PLAN:  Will admit the patient to step-down unit under Dr. Kirby Funk  with a diagnosis of tooth pain, possible infection, uncontrolled  hypertensive urgency with the cocaine use.  The patient will be on low-  salt and diabetic diet.  Will check vitals signs, I&O as per protocol.  The patient will get CBC, CMP with differential in the morning.  Will  check HIV 1 antibody and hepatitis profile in the morning due to ongoing  cocaine use.  We will check urine drug screen now.  We will check  troponin with cardiac enzymes at the 12 o'clock midnight and 6 o'clock  in the morning.  Will give him IV morphine 2 mg q.3 h p.r.n. for tooth  pain.  Will give him sublingual nitroglycerin 0.4 mg q. 5 minutes for  three doses.  Will start Ativan 2 mg p.o. q.6 h p.r.n. for his cocaine  use.  Will start nitroglycerin drip at 5 mcg per minute, will titrate up  to 20 mcg per minute with a blood pressure goal of systolic  120/diastolic 80 mmHg.  Will start p.o. Nexium for GI prophylaxis and  SCDs on the leg at this time.  Further plan and workup according to the  labs pending.      Donalynn Furlong, MD  Electronically Signed     TVP/MEDQ  D:  08/16/2008  T:  08/17/2008  Job:  [id number]   cc:   Thora Lance, M.D.

## 2010-12-21 NOTE — Discharge Summary (Signed)
NAME:  Cory Marks, CORNIA NO.:  [id number]   MEDICAL RECORD NO.:  [id number]          PATIENT TYPE:  INP   LOCATION:  2922                         FACILITY:  [facility]   PHYSICIAN:  Thora Lance, M.D.  DATE OF BIRTH:  1961/02/07   DATE OF ADMISSION:  08/16/2008  DATE OF DISCHARGE:  08/19/2008                               DISCHARGE SUMMARY   REASON FOR ADMISSION:  A 50 year old African American male who presented  with acute onset of left lower molar pain.  He has a history of  unfinished dental work in left lower molar and was supposed to undergo  root canal surgery.  He denied any chest pain, shortness of breath,  cough, sputum production, GI symptoms, or edema.  He did admit to using  cocaine 2 days prior to admission and does frequently do so.  In the ER,  his blood pressure was 221/146 and he was admitted to the hospital.   SIGNIFICANT FINDINGS:  VITAL SINGS:  Blood pressure 221/146, heart rate  81, respirations 15, and temperature 98.5.  LUNGS:  Clear.  HEART:  Regular rate and rhythm without murmur, gallop, or rub.  ABDOMEN:  Benign.  EXTREMITIES:  Showed no edema.  HEENT:  Oral cavity showed left lower molar tooth with some pain and  redness in the area.  NEUROLOGIC:  Left-sided Bell palsy, otherwise nonfocal.   LABORATORY DATA:  EKG, normal sinus rhythm with a normal axis.  No acute  ST- or T-wave changes.  Urinalysis, protein more than 300.  CT of the  head showed chronic ischemic changes.  Chest x-ray, no acute cardio  pulmonary process.  Basic metabolic profile with creatine was 1.75, GFR  of 0.51.  CBC unremarkable except for WBC of 3.6.  Cardiac enzymes were  normal.   HOSPITAL COURSE:  1. Hypertensive urgency.  The patient was admitted with a hypertensive      urgency, although no obvious acute sequela of his hypertension.  He      was placed on IV nitroglycerin and also hydralazine p.r.n.  The      second hospital day, hid blood pressure was  under better control.      On the third hospital day his pressure was documented at 152/112.      He was tapered off of nitroglycerin and was started on Diovan HCT      and amlodipine.  At discharge, his blood pressure was 146/85 on      this medications.  His creatinine at discharge was 1.7, which will      be followed as an outpatient.  2. Dental pain.  The patient had what looked like a possible tooth      abscess in the left lower molar.  He was treated with clindamycin.      His pain improved and he will be treated with Vicodin as an      outpatient.  He plans on calling his dentist ASAP when he gets home      and is setting up an appointment.  3. Cocaine abuse.  The patient admitted to  using cocaine and this was      confirmed by his urine drug screen.  He had no documented      complications from that other than his elevated blood pressure.  He      was strongly advised not to use cocaine in the future and advised      of the risk of myocardial infarction, other cardiac complications,      or stroke with the combination of cocaine use and then extremely      high blood pressures.  4. Right neck lymphadenopathy.  The patient pointed out to me a      lymphadenopathy at the angle of the right jaw and he did in fact      having a large 2 cm firm lymph node there.  This conceivably could      be a malignancy and therefore require a biopsy.  I have instructed      the patient to follow up with me in 1-2 weeks and we will address      this at that time if possible we will refer him for biopsy.   DISCHARGE DIAGNOSES:  1. Hypertensive urgency.  2. Dental abscess.  3. Hypertension.  4. Mild renal insufficiency.  5. Cocaine abuse.  6. Hypokalemia.   PROCEDURE:  CT scan of the brain.   DISCHARGE MEDICATIONS:  1. Diovan HCT 160/25 mg daily.  2. Amlodipine 5 mg daily.  3. Vicodin 5/500, 1-2 q.6 p.r.n. for dental pain.  4. Clindamycin 300 mg 1 p.o. q.6 for 5 days and then  discontinue.   DISPOSITION:  Discharged to home.   FOLLOWUP:  Followup 1-2 weeks with Dr. Valentina Lucks.   DIET:  Low-sodium diet.   ACTIVITY:  As tolerated.           ______________________________  Thora Lance, M.D.     JJG/MEDQ  D:  08/19/2008  T:  08/19/2008  Job:  [id number]

## 2010-12-21 NOTE — H&P (Signed)
NAME:  Cory Marks, Cory Marks NO.:  [id number]   MEDICAL RECORD NO.:  [id number]          PATIENT TYPE:  EMS   LOCATION:  [facility]                         FACILITY:  [facility]   PHYSICIAN:  Donalynn Furlong, MD      DATE OF BIRTH:  1960/09/24   DATE OF ADMISSION:  08/16/2008  DATE OF DISCHARGE:                              HISTORY & PHYSICAL   PRIMARY CARE PHYSICIAN:  Thora Lance, M.D.   HISTORY OF PRESENT ILLNESS:  Dictation stopped at this point.      Donalynn Furlong, MD     TVP/MEDQ  D:  08/16/2008  T:  08/17/2008  Job:  [id number]

## 2010-12-22 NOTE — Consult Note (Signed)
NAME:  Cory Marks, Cory Marks NO.:  [id number]  MEDICAL RECORD NO.:  [id number]           PATIENT TYPE:  I  LOCATION:  5501                         FACILITY:  [facility]  PHYSICIAN:  Terrial Rhodes, M.D.DATE OF BIRTH:  09-28-1960  DATE OF CONSULTATION:  12/01/2010 DATE OF DISCHARGE:                                CONSULTATION   REFERRING PHYSICIAN:  Marinda Elk, MD  REASON FOR CONSULTATION:  Acute kidney injury on chronic kidney disease.  HISTORY OF PRESENT ILLNESS:  Cory Marks is a 50 year old African American male with past medical history significant for longstanding poorly-controlled diabetes, hypertension and ongoing cocaine abuse who presented to [facility] with increasing fatigue, shortness of breath and abdominal distention.  The patient reports that he has had a swollen mass on right side of his neck for about 2 years and he was recommended to have a biopsy.  However, he did not have any insurance at that timeand had not sought out any health care for the last 2 years and he was seen by his Paediatric nurse.  His barber discussed that his mass on his neck was getting bigger and came into the hospital.  We were asked to see the patient due to rise in serum creatinine from 2.5 on admission to 2.64 today.  Of note, he had a serum creatinine of 1.75 dating back to January 2010.  He also had labs back in 2010 that were significant for proteinuria and it was also well documented with his ongoing cocaine abuse and poorly controlled diabetes and hypertension.  Also, of concern is the CT scan obtained and workup for his lymphadenopathy showed a significantly enlarged spleen with displacement of his left kidney centrally and inferiorly.  There was no hydronephrosis.  On CT scan, he also has pelvic lymphadenopathy consistent with lymphoma.  He denies any dysuria, pyuria, hematuria.  He has had lower extremity edema for several years.  He has also been taking Aleve  occasionally for pains. No rashes and he has not been taking any medicines regularly for the last 2 years.  ALLERGIES:  He has allergies to OXYCODONE which causes itching.  PAST MEDICAL HISTORY: 1. Hypertension poorly controlled. 2. Diabetes mellitus poorly controlled. 3. Chronic kidney disease stage III with creatinine of 1.7 to 1.75     dating back to 2010 with proteinuria.  No workup done. 4. Cocaine abuse. 5. History of dental abscess.  OUTPATIENT MEDICATIONS:  None.  FAMILY HISTORY:  Mother and father alive and well and are in his room. No history of kidney disease.  SOCIAL HISTORY:  He is currently separated.  Lives by myself.  Has ongoing cocaine and alcohol use.  Denies tobacco.  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  He has one stepson age 43 in good health.  REVIEW OF SYSTEMS:  GENERAL:  He has had some malaise and fatigue. OPHTHALMIC:  No blurred vision, photophobia.  ENT:  No tinnitus, dysphagia, odynophagia but has noted an enlarging mass on his right neck.  CARDIAC:  Denies any chest pain, palpitations, orthopnea. PULMONARY:  Has had some shortness of  breath and dyspnea on exertion. No hemoptysis, productive cough.  GI:  No nausea, vomiting, hematochezia, melena or bright red blood per rectum.  Has a good appetite.  GU:  No dysuria, pyuria, hematuria, urgency, frequency or retention.  RHEUMATOLOGIC:  Occasional arthralgias.  No swollen or painful joints.  DERMATOLOGIC:  No new rashes.  He has some nonhealing ulcers on his right leg.  NEUROLOGIC:  No numbness, tingling, weakness, ataxia, aphasia.  HEMATOLOGIC:  No abnormal bleeding or bruising. INFECTIOUS:  Denies any fevers, chills.  He said he did have night sweats but they have stopped several months to a year ago.  All other systems negative.  PHYSICAL EXAMINATION:  GENERAL:  A well-developed, well-nourished man in no apparent distress. VITAL SIGNS:  Temperature 98.3, pulse 93,  blood pressure 139/81, respiratory rate 20, weight is 105 kg.  He is 6 feet 2 inches tall. HEENT:  Head normocephalic, atraumatic.  Extraocular muscles intact.  No icterus.  Oropharynx without lesions. NECK:  He has a large right submandibular/cervical mass that is firm, nontender and mildly mobile with diffuse anterior cervical and posterior cervical lymphadenopathy, nontender. LUNGS:  Clear to auscultation and percussion bilaterally.  No rales or rhonchi. CARDIAC:  Regular rate and rhythm with a 2/6 systolic ejection murmur heard best at the apex. ABDOMEN:  Normoactive bowel sounds.  He has a markedly enlarged spleen that is firm, nontender, no guarding or rebound but his abdomen is distended.  EXTREMITIES:  He has 1+ pitting edema bilaterally right greater than left, 1+ pedal pulses bilaterally.  Some onychomycotic changes of his toenails bilaterally.  No cyanosis or clubbing.  LABORATORY DATA:  Sodium 139, potassium 4.8, chloride 103, CO2 27, BUN 27, creatinine 2.64, glucose 93, calcium 9.5.  White blood cell count of 17.5, hemoglobin 6.5, platelets 53.  Differential was pending.  On a previous CBC, he had 99% lymphocytes.  His hepatitis C antibody is negative, hep B surface antibody is positive, hep B surface antigen is negative.  HIV antibody is negative.  His urine tox screen is positive for cocaine, metabolites.  Iron saturation is 10%.  Vitamin B12 948, ferritin 81.  Folic acid 5.6.  Haptoglobin of 36.  CT scan of the abdomen and pelvis without contrast showed massive splenomegaly and abdominal and pelvic lymphadenopathy consistent with lymphoma, a displaced left kidney medially and inferiorly, no hydro, a small amount of abdominal ascites, also had a right lower lobe pulmonary nodule 0.7 cm.  CT scan of the head and neck are pending and chest.  ASSESSMENT/PLAN: 1. Acute kidney injury.  This is possibly progression of his chronic     kidney disease as he has not had any  medications or any control of     his diabetes and hypertension for 2 years and he also had evidence     of proteinuria 2 years ago.  Also on differential would be a     lymphoma involvement of his kidneys, abdominal compartment syndrome     given his massive splenomegaly and tense abdominal exam.  Also on     differential would be crack cocaine use and nephropathy.  At this     time, there is no evidence of hydro despite a displaced kidney and     he is making urine and we will just continue to follow for now.     However, I would recommend an intra-abdominal pressure measurement     to rule out abdominal compartment syndrome.  I will also order a  urinalysis and electrolytes as well as protein electrophoresis,     however, main issue is treatment of his underlying lymphoma. 2. Hypertension.  His blood pressure is actually adequately controlled     off medications.  We will continue to follow.  Would avoid ACE     inhibitors or ARBs at this time. 3. Pancytopenia presumably due to lymphoma. 4. Lymphoma with diffuse lymphadenopathy and splenomegaly per     Oncology. 5. Diabetes mellitus per primary service. 6. Crack cocaine use.  Can stress importance of abstinence.  We will     continue to follow along.  Thank you for this consultation.         ______________________________ Terrial Rhodes, M.D.     JC/MEDQ  D:  12/01/2010  T:  12/02/2010  Job:  [id number]  Electronically Signed by Terrial Rhodes M.D. on 12/22/2010 08:07:18 AM

## 2010-12-24 NOTE — Discharge Summary (Signed)
NAME:  Cory Marks, Cory Marks NO.:  [id number]   MEDICAL RECORD NO.:  [id number]          PATIENT TYPE:  INP   LOCATION:  5703                         FACILITY:  [facility]   PHYSICIAN:  Thora Lance, M.D.  DATE OF BIRTH:  10-21-1960   DATE OF ADMISSION:  02/06/2006  DATE OF DISCHARGE:  02/09/2006                                 DISCHARGE SUMMARY   REASON FOR ADMISSION:  This is a 50 year old Black male with history of  hypertension who presented with fatigue, polyuria, polydipsia, generalized  weakness, and was found to have severe hyperglycemia.   SIGNIFICANT FINDINGS:  VITAL SIGNS:  Blood pressure 84/58.  Heart rate 86.  Temperature 98.5.  HEENT:  Mucous membranes were dry.  CHEST:  Normal.  HEART:  Normal.  ABDOMEN:  Normal.  RECTAL:  Anal tenderness at 12 o'clock.   LABORATORIES:  Blood sugar 1162, BUN 20, creatinine 2.3, sodium 119,  potassium 4.5, chloride 78, bicarbonate 31, calcium 10.1.  LFTs normal.  Hemoglobin 17.7, hematocrit __________ , platelets 208.  Urine:  Glucose 3+.  Urine drug screen positive for cocaine.  Chest x-ray:  No acute disease.  EKG:  Ectopic atrial rhythm and possible old IMI.   HOSPITAL COURSE:  1.  Severe hyperglycemia:  The patient was admitted with severe      hyperglycemia.  His previous blood sugar in the office was 158 seven      months prior, and a fasting blood sugar was 105 ten months prior.  The      patient was placed on Glucommander IV insulin.  By the next hospital      day, blood sugars were down in the 100s.  He was __________  insulin      Lantus this morning.  This was converted to 70/30 on a sliding scale.      His blood sugars was 91 the morning of discharge.  We will start him on      glimepiride 1 day prior to discharge.  It was hoped that he would soon      be able to get off insulin and be treated with oral medications      __________ .  He has been given diabetic teaching and insulin      administration with  blood sugar monitoring, diabetic diet.  His      hemoglobin A1c was 14.3.  2.  __________  volume depletion __________  acute renal failure:  The      patient was treated with __________ .  At discharge, his creatinine was      down to __________  1.2.  His potassium was slightly low and was      repleted.  His ARB/diuretic combination was held during the admission,      but restarted at 1/2 dose on the day of discharge.  His blood pressure      at discharge was 146/95.  3.  Abnormal EKG:  The patient did show a possible IMI and ectopic atrial      rhythm on his EKG.  Cardiac enzymes were negative.  A 2-D echo was done      and showed normal left ventricular function, but did suggest a possible      inferior basal wall motion abnormality.  It is possible the patient has      an old IMI, and we will schedule a Cardiolite as an outpatient.  4.  Anal fissure:  The patient was __________.  5.  __________  dental abscess:  The patient's __________ very poor      dentition __________  left lower molars.  The patient will be treated      with a week of amoxicillin.  He was asked to make an appointment with a      dentist after discharge.  6.  Cocaine abuse:  As mentioned, the patient's UDS was positive for      cocaine.  He did admit to occasional use of cocaine, estimated about      once a week.  Outpatient treatment for cocaine use was offered, but the      patient declined.  The patient was advised not to use cocaine.  He was      told that this could have played a role in his severe hyperglycemia.   DISCHARGE DIAGNOSES:  1.  Diabetes mellitus.  2.  Severe hyperglycemia.  3.  Volume depletion.  4.  Azotemia.  5.  Ectopic atrial rhythm.  6.  Anal fissure.  7.  Dental abscess.  8.  Hypertension.  9.  Cocaine use.   PROCEDURES:  A 2-D echocardiogram.   DISCHARGE MEDICATIONS:  1.  Humulin insulin 70/30, 25 units a.m., 15 units p.m., 30 minutes before      breakfast and dinner.  2.   Glimepiride 4 mg q. A.m.  3.  Amoxicillin 500 mg 1 t.i.d. for 4 days.  4.  Docusate 100 mg b.i.d.  5.  Diovan HCT 320/25 one-half tablet a day.   DISPOSITION:  Discharge to home.   DIET:  Diabetic diet.   ACTIVITY:  As tolerated.   FOLLOWUP:  February 13, 2006, Dr. Valentina Lucks.   DIABETIC TEACHING:  The patient will have diabetic teaching arranged as an  outpatient.  He was told to check his blood sugars before breakfast and  dinner.  If his blood sugars drop below 80, he is to cut his total insulin  dose back by 10%.  He was instructed on how to deal with any hypoglycemia.  We will carefully monitor the patient's blood sugars daily by telephone.           ______________________________  Thora Lance, M.D.     JJG/MEDQ  D:  02/09/2006  T:  02/09/2006  Job:  [id number]

## 2010-12-31 NOTE — Consult Note (Signed)
NAME:  Cory Marks, Cory Marks NO.:  [id number]  MEDICAL Marks NO.:  [id number]           PATIENT TYPE:  LOCATION:                                 FACILITY:  PHYSICIAN:  Cory Marks, M.D.DATE OF BIRTH:  1960/09/06  DATE OF CONSULTATION:  12/01/2010 DATE OF DISCHARGE:                                CONSULTATION   REFERRING PHYSICIAN:  Marinda Elk, MD  REASON FOR CONSULTATION:  Adenopathy.  HISTORY OF PRESENT ILLNESS:  [name] is a 50 year old man admitted on December 01, 2010, with a 2-week history of fatigue, increasing shortness of breath, and increased abdominal girth.  In 2010, the patient had been evaluated for a hypertensive emergency, and was found to have enlarged right cervical adenopathy.  Upon discharge, he was instructed to follow up with his primary, but failed to do so.  On this admission, he was found to have prominent cervical adenopathy, including axillary and inguinal adenopathy.  The patient complains of low-grade fevers without chills or night sweats.  He notes an undocumented weight loss. On November 30, 2010, a CT of the abdomen and pelvis notes to show a 12 x 12 x 13-cm spleen, bulky retroperitoneal and pelvic adenopathy.  His LDH is elevated at 376.  On admission, his H and H was 6.5 and 21.2, and the patient is to receive 2 units of packed RBCs to improve those numbers. Admission platelets were also low at 61,000.  There is no evidence of bleeding or bruising.  His white count is elevated as well.  CT of the head and neck reports are currently pending.  Labs including  beta-2 microglobulin and hepatitis panel is currently pending.  PAST MEDICAL HISTORY: 1. Anemia. 2. Hypertension with hypertensive emergency in January 2010. 3. Chronic kidney disease stage III initially diagnosed in 2007. 4. Obesity. 5. Diabetes mellitus type 2. 6. History of cocaine use. 7. History of left Bell palsy in 1992, with residual. 8. Medicine  noncompliance. 9. Prior alcohol history. 10.Prior dental abscess. 11.Hemorrhoids.   SURGERIES:  Status post left knee surgery post trauma in 1977.  ALLERGIES:  OXYCODONE causes itching.  MEDICATIONS: 1. Lovenox 40 mg nightly. 2. Morphine sulfate. 3. Tylenol. 4. Zofran. 5. Ambien.  REVIEW OF SYSTEMS:  He states he feels warm, but he denies any chills or night sweats.  No headaches or mental status changes.  No vision changes.  He has some dyspnea on exertion, abdominal discomfort with increased girth, decreased appetite, weight loss, dry mouth, fatigue. Intermittent nausea.  No vomiting.  Rest of the review of systems is negative.  FAMILY HISTORY:  Mother and father both alive and well with the exception of some hypertension and CAD history.  No siblings.  Maternal grandfather had lung cancer.  Paternal grandfather had cancer of unknown type.  SOCIAL HISTORY:  The patient is divorced, lives with parents; Cory Marks and Cory Marks.  He is a Marine scientist.  He has one stepson.  He lives in [place].  Full code.  His last documented use of alcohol is in the year 2007.  He has a history of cocaine, with positive urine  screen during this admission.  PHYSICAL EXAMINATION:  GENERAL:  This is a well-developed 50 year old African American male in no acute distress, alert and oriented x3. VITAL SIGNS:  Blood pressure 126/83, pulse 91, respirations 20, temperature 98.6, saturations 96% in room air, weight 105 kg. HEENT:  Remarkable for left Bell palsy, atraumatic.  PERLA.  Oral cavity without thrush or lesions. NECK:  Bulky, firm, nontender adenopathy measuring about 4 x 5 cm, accompanied by pre and postauricular cervical adenopathy, more pronounced on the right.  No supraclavicular masses. LUNGS:  Clear to auscultation bilaterally, there is the presence of right axillary masses more than in the left. CARDIOVASCULAR:  Regular rate and rhythm without murmurs, rubs, or gallops. ABDOMEN:   Increased girth, nontender, bowel sounds x4.  There is a massive splenomegaly, up to the pubic bone.  No hepatomegaly. GU:  Deferred. RECTAL:  Deferred. EXTREMITIES:  No clubbing or cyanosis.  No edema.  Bilateral inguinal masses are present. SKIN:  Without lesions, bruising, or petechial rash. NEUROLOGIC:  Nonfocal with the exception of residual left Bell palsy.  LABORATORY DATA:  Hemoglobin 6.5, hematocrit 21.2, white count 17.5, platelets 53, MCV 80.9, ANC 0.2, lymphocytes 19.5, monocytes zero.  Iron 32, TIBC 334, percentage saturation 10, ferritin 81, folic acid 5.6, B12 of 948.  PTT 32, PT 15, INR 1.16.  D-dimer 13.37.  Lipase 42.  Sodium 139, potassium 4.8, chloride 103, CO2 of 27, BUN 27, creatinine 2.64, glucose 93.  Total bilirubin 0.9, alkaline phosphatase 54, AST 26, ALT 10, total protein 6.8, albumin 3.6, calcium 9.5, LDH 536, troponin negative.  Uric acid 10.7.  LDH 576.  ASSESSMENT AND PLAN: 1. Cory Marks is a 50 year old African American male, admitted for     evaluation of a neck mass, which requires a biopsy for tissue.      Ent consult is pending.  2. Massive splenomegaly, in the setting of low platelet count.  The     patient will continue to be monitored, for any signs of bleeding. 3. Elevated uric acid, begin gentle hydration and have pharmacy dose     allopurinol. 4. Renal insufficiency, suggest Nephrology consult.  In view of     bulking adenopathy, obtain a renal ultrasound.  Thank you very much for allowing [place] the opportunity to participate in the care of Cory Marks.  We will follow with you.     Cory Kays, PA-C   ______________________________ Cory Marks, M.D.    SW/MEDQ  D:  12/02/2010  T:  12/03/2010  Job:  [id number]  cc:   Cory Marks, M.D.  Electronically Signed by Cory Marks P.A. on 12/03/2010 08:30:30 AM Electronically Signed by Cory Marks M.D. on 12/31/2010 10:08:49 AM

## 2011-01-13 NOTE — Consult Note (Signed)
  NAME:  RAMONTE, MENA NO.:  [id number]  MEDICAL RECORD NO.:  [id number]           PATIENT TYPE:  I  LOCATION:  5501                         FACILITY:  [facility]  PHYSICIAN:  Kristine Garbe. Ezzard Standing, M.D.DATE OF BIRTH:  [birth year]  DATE OF CONSULTATION:  12/01/2010 DATE OF DISCHARGE:                                CONSULTATION   REASON FOR CONSULT:  Evaluate the patient with neck lymphadenopathy for biopsy to rule out lymphoma.  BRIEF HISTORY:  Evie Crumpler is a 50 year old gentleman who has had poor medical followup.  He initially was noted to have enlarged neck nodes couple of years ago and did not follow up with this as recommended.  He presented into the emergency room yesterday complaining of abdominal distention over the last 2 months, shortness of breath and fatigue.  He lives by myself.  He has several enlarged lymph nodes in the right neck, especially in the region of the tail of parotid has a very large mass measuring approximately 3 x 5 cm just behind the angle of the jaw on the right side.  He had a CT scan which showed significant splenomegaly.  His blood work shows him to be anemic with hemoglobin of 6.8 and white count of 17,000.  He also has some renal insufficiency with a creatinine of 2.5.  His platelet count is down to 61,000.  He is admitted at this time for workup of lymphadenopathy, splenomegaly, anemia, and leukocytosis findings consistent with probable lymphoma or lymphoproliferative disease.  I was consulted for lymph node biopsy.  On examination again, the patient has a large about 3-5 cm firm node just behind the angle of the jaw on the right side.  He had some preauricular nodes little bit smaller 1-2 cm size, little bit mobile.  He also has some postoccipital nodes that are palpable.  He has no real oral complaints, no sore throat, no airway problems, and no hoarseness.  IMPRESSION: 1. Multiple lymphadenopathy consistent with a  probable     lymphoproliferative disease. 2. Anemia. 3. Thrombocytopenia. 4. Renal insufficiency. 5. Splenomegaly.  PLAN:  We will plan on scheduling for open biopsy of the right neck node under either a local or general anesthesia tomorrow 11:00 a.m.  Can schedule bone marrow following on neck node biopsy.          ______________________________ Kristine Garbe Ezzard Standing, M.D.     CEN/MEDQ  D:  12/01/2010  T:  12/02/2010  Job:  [id number]  Electronically Signed by Dillard Cannon M.D. on 01/13/2011 11:30:20 AM

## 2011-01-13 NOTE — Op Note (Signed)
NAME:  [name], [name] NO.:  [id number]  MEDICAL RECORD NO.:  [id number]           PATIENT TYPE:  I  LOCATION:  5501                         FACILITY:  [facility]  PHYSICIAN:  Kristine Garbe. Ezzard Standing, M.D.DATE OF BIRTH:  [birth year]  DATE OF PROCEDURE:  12/02/2010 DATE OF DISCHARGE:                              OPERATIVE REPORT   PREOPERATIVE DIAGNOSES:  Right neck lymphadenopathy and history consistent with probable lymphoproliferative neoplasm.  POSTOPERATIVE DIAGNOSES:  Right neck lymphadenopathy and history consistent with probable lymphoproliferative neoplasm.  OPERATIONS:  Incisional biopsy of right neck parotid node.  SURGEON:  Kristine Garbe. Ezzard Standing, MD  ANESTHESIA:  General endotracheal.  COMPLICATIONS:  None.  BRIEF CLINICAL NOTE:  Cory Marks is a 50 year old gentleman who has had right neck lymphadenopathy for couple of years, which gradually gotten larger.  He is also developed severe anemia, thrombocytopenia and significant splenomegaly.  He is admitted via the emergency room for workup of adenopathy.  He is taken to the operating room at this time for biopsy of right neck node.  He has approximate 4-5 cm high right neck node in the region of the inferior aspect of the parotid gland. This is firm and is almost adherent to the dermis.  His platelet count is running in the 50,000 and hemoglobin was around 7.  Because of this and size of the lymph node, we will plan partial excision of the lymph node and this should be adequate for diagnosis as discussed previously with pathologist.  DESCRIPTION OF PROCEDURE:  The patient was brought to the operating room, underwent general endotracheal anesthesia, received 1 g of Ancef IV preoperatively.  Neck was prepped with Betadine solution and draped out with sterile towels.  An incision was made over the posterior portion of the lymph node.  The lymph node extended just about to the dermis.  The dermis and  skin were elevated off the lymph nodes superiorly and inferiorly to expose about 4 cm of lymph node.  A large wedge of lymph node was then excised and sent to pathology and saline. Frozen section on this was read as lymphoproliferative disorder and pathologist felt that they had enough tissue for definitive diagnosis. Following excisional biopsy, hemostasis was obtained with regular cautery as well as bipolar cautery.  Some additional tissue was sent for permanent section.  After obtaining adequate hemostasis, the wound was irrigated with saline.  Skin was reapproximated with 3-0 chromic sutures subcutaneously and 5-0 nylon reapproximate the skin edges.  A 0.25-inch Penrose drain was placed in the middle lobe excision site and brought out inferiorly from the wound.  Bacitracin ointment and sterile dressing was applied.  The patient was awakened from anesthesia and transferred to the recovery room postop doing well.  DISPOSITION:  Cory Marks we transferred back to the floor.  We will plan on removing the Penrose drain in 1-2 days and removing sutures in a week.          ______________________________ Kristine Garbe. Ezzard Standing, M.D.     CEN/MEDQ  D:  12/02/2010  T:  12/03/2010  Job:  [id number]  Electronically Signed by Dillard Cannon M.D. on  01/13/2011 11:30:32 AM

## 2011-01-30 DIAGNOSIS — Z9081 Acquired absence of spleen: Secondary | ICD-10-CM

## 2011-01-30 HISTORY — PX: SPLENECTOMY, TOTAL: SHX788

## 2011-01-30 HISTORY — DX: Acquired absence of spleen: Z90.81

## 2011-05-05 DIAGNOSIS — C8310 Mantle cell lymphoma, unspecified site: Secondary | ICD-10-CM | POA: Insufficient documentation

## 2011-05-30 DIAGNOSIS — Z9484 Stem cells transplant status: Secondary | ICD-10-CM | POA: Insufficient documentation

## 2011-12-05 DIAGNOSIS — N529 Male erectile dysfunction, unspecified: Secondary | ICD-10-CM | POA: Insufficient documentation

## 2011-12-05 DIAGNOSIS — E1142 Type 2 diabetes mellitus with diabetic polyneuropathy: Secondary | ICD-10-CM | POA: Insufficient documentation

## 2012-02-23 IMAGING — PT NM PET TUM IMG INITIAL (PI) SKULL BASE T - THIGH
1 of 6 series · 1 of 25 positions shown · non-contrast
Comparison: CT neck 12/01/2010, CT thorax 12/01/2010, CT abdomen
12/01/2010.

CLINICAL DATA: Initial treatment strategy for non Hodgkin's
lymphoma.  Right neck biopsy.

NUCLEAR MEDICINE PET SKULL BASE TO THIGH
Fasting Blood Glucose:  91
TECHNIQUE: 17.1 mCi F-18 FDG was injected intravenously via the
right side.  Full-ring PET imaging was performed from the skull
base through the mid-thighs 59  minutes after injection.  CT data
was obtained and used for attenuation correction and anatomic
localization only.  (This was not acquired as a diagnostic CT
examination.)

[Series 2: ct images · axial · 3.8mm · 0.98mm/px · 1 of 267 slices shown]
[im 267/267  brain]
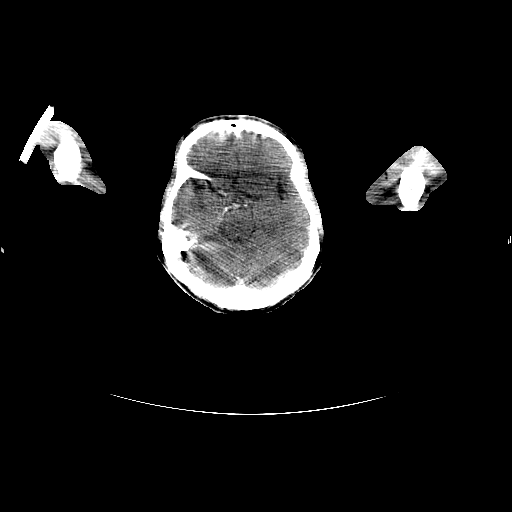

[1 of 25 positions shown; findings below may reference images not displayed]

FINDINGS: Neck:Enlarged right level II and right parotid lymph nodes have
moderate F D G activity with SUV max = 6.0 (image 28).  The right
parotid enlargement measures 4.4 x 4.7 cm.

Chest:There are enlarged paratracheal and perihilar lymph nodes as
well as periaortic lymph nodes along the descending thoracic aorta.
The descending thoracic aortic nodes have moderate F D G activity (
SUV max = 6.0).  Enlarged 3 cm prevascular node with mild metabolic
activity ( SUV max = 3.7 ).  There is peribronchial thickening in
the lower lobes.  There is a 5 mm nodule in the right lower lobe on
image 81.

Abdomen / Pelvis:There is massive enlargement of the spleen.  The
spleen is diffusely hypermetabolic with SUV max = 5.1.
Hypermetabolic periportal and  gastrohepatic ligament lymph nodes
with SUV max = 6.8. Mild metabolic active associated with
retroperitoneal nodes.

The left kidney is midline in the lower abdomen likely displaced by
the splenomegaly.

There are enlarged bilateral mildly hypermetabolic inguinal and
pelvic lymph nodes. For example right inguinal lymph node measuring
1.7 cm short axis with SUV max = 3.0.

Skeleton:No aggressive osseous lesions evident.
IMPRESSION: 1.  Hypermetabolic lymph nodes in the right neck, mediastinum,
portahepatis, retroperitoneum,  and pelvic nodes and inguinal nodes
consistent with widespread lymphomatous involvement. The activity
of  the majority these nodes  is moderate.

2.  Diffuse moderate hypermetabolic activity associated with
massively enlarged spleen.

## 2012-09-12 ENCOUNTER — Ambulatory Visit: Payer: Self-pay | Admitting: Family Medicine

## 2012-09-12 VITALS — BP 136/82 | HR 57 | Temp 98.2°F | Resp 18 | Ht 72.5 in | Wt 228.9 lb

## 2012-09-12 DIAGNOSIS — Z0289 Encounter for other administrative examinations: Secondary | ICD-10-CM

## 2012-09-12 DIAGNOSIS — C8590 Non-Hodgkin lymphoma, unspecified, unspecified site: Secondary | ICD-10-CM | POA: Insufficient documentation

## 2012-09-12 DIAGNOSIS — Z0000 Encounter for general adult medical examination without abnormal findings: Secondary | ICD-10-CM

## 2012-09-12 DIAGNOSIS — I10 Essential (primary) hypertension: Secondary | ICD-10-CM | POA: Insufficient documentation

## 2012-09-12 NOTE — Patient Instructions (Addendum)
You cannot currently be DOT certified as you are taking metadone and gabapentin for your cancer treatment.  If you stop these in the future we should be able to certify you- otherwise your exam is satisfactory

## 2012-09-12 NOTE — Progress Notes (Signed)
[facility] [address], [place] [address] [phone]  Date:  09/12/2012   Name:  Cory Marks   DOB:  08-23-1960   MRN:  [id number]  PCP:  Dorrene German, MD    Chief Complaint: Annual Exam   History of Present Illness:  Cory Marks is a 52 y.o. 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  He is suffering from cancer and is not currently driving, but needs to keep his card UTD.  He does have HTN and takes norvasc.  He had DM in the past, but resolved with weight loss due to cancer so he does not need diabetes medications any more.    He was diagnosed with lymphoma in 2012.  He is in remission, but does take chemo every 3 months.  He states his strength and energy level is recovering.  He does have some unusual sensation in his feet since he has been on chemo, but this does not interfere with driving/ feeling the pedels under his feet. He states he feels as through he is "walking on balloons."    Called his pharmacy to obtain his medication list- he is on metadone  There is no problem list on file for this patient.   Past Medical History  Diagnosis Date  . Hypertension   . Diabetes mellitus   . Bell's palsy     No past surgical history on file.  History  Substance Use Topics  . Smoking status: Never Smoker   . Smokeless tobacco: Not on file  . Alcohol Use: No    No family history on file.  Allergies  Allergen Reactions  . Oxycodone     Medication list has been reviewed and updated.  No current outpatient prescriptions on file prior to visit.    Review of Systems:  As per HPI- otherwise negative.   Physical Examination: Filed Vitals:   09/12/12 0819  BP: 136/82  Pulse: 57  Temp: 98.2 F (36.8 C)  Resp: 18   Filed Vitals:   09/12/12 0819  Height: 6' 0.5" (1.842 m)  Weight: 228 lb 14.4 oz (103.828 kg)   Body mass index is 30.62 kg/(m^2). Ideal Body Weight: Weight in  (lb) to have BMI = 25: 186.5   GEN: WDWN, NAD, Non-toxic, A & O x 3 HEENT: Atraumatic, Normocephalic. Neck supple. No masses, No LAD.  Bilateral TM wnl, oropharynx normal.  PEERL,EOMI.   Ears and Nose: No external deformity. CV: RRR, No M/G/R. No JVD. No thrill. No extra heart sounds. PULM: CTA B, no wheezes, crackles, rhonchi. No retractions. No resp. distress. No accessory muscle use. ABD: S, NT, ND, +BS. No rebound. No HSM. EXTR: No c/c/e.  satisfactory strength and ROM of limbs, scarring and stiffness right shin/ ankle due to old football injury and surgery.   NEURO Normal gait. He has adequate sensation of the soles of both feet GU: no hernia PSYCH: Normally interactive. Conversant. Not depressed or anxious appearing.  Calm demeanor.    Assessment and Plan: 1. Physical exam, annual    Kyvon meets criteria for a 1 year DOT license EXCEPT he is currently on methadone. Let him know that I am not able to certify him at this time.  He is disappointed but understood  Shalise Rosado, MD

## 2012-09-13 ENCOUNTER — Encounter: Payer: Self-pay | Admitting: Family Medicine

## 2012-12-22 ENCOUNTER — Ambulatory Visit (INDEPENDENT_AMBULATORY_CARE_PROVIDER_SITE_OTHER): Payer: BC Managed Care – PPO | Admitting: Emergency Medicine

## 2012-12-22 VITALS — BP 100/72 | HR 70 | Temp 98.3°F | Resp 16 | Ht 72.0 in | Wt 226.6 lb

## 2012-12-22 DIAGNOSIS — W57XXXA Bitten or stung by nonvenomous insect and other nonvenomous arthropods, initial encounter: Secondary | ICD-10-CM

## 2012-12-22 MED ORDER — DOXYCYCLINE HYCLATE 100 MG PO CAPS: ORAL_CAPSULE | ORAL | Status: DC

## 2012-12-22 NOTE — Progress Notes (Signed)
[facility] [address], [place] [address] [phone]  Date:  12/22/2012   Name:  Cory Marks   DOB:  1960-09-17   MRN:  [id number]  PCP:  Dorrene German, MD    Chief Complaint: Insect Bite   History of Present Illness:  Cory Marks is a 52 y.o. very pleasant male patient who presents with the following:  Had pulled a tick off his right abdomen 5 days ago.  He has no rash or fever.  No pain at site.  Is concerned there may be a bit of the head remaining. No improvement with over the counter medications or other home remedies. Denies other complaint or health concern today.   Patient Active Problem List   Diagnosis Date Noted  . HTN (hypertension) 09/12/2012  . Lymphoma 09/12/2012    Past Medical History  Diagnosis Date  . Hypertension   . Diabetes mellitus   . Bell's palsy     No past surgical history on file.  History  Substance Use Topics  . Smoking status: Never Smoker   . Smokeless tobacco: Not on file  . Alcohol Use: No    No family history on file.  Allergies  Allergen Reactions  . Oxycodone     Medication list has been reviewed and updated.  Current Outpatient Prescriptions on File Prior to Visit  Medication Sig Dispense Refill  . acyclovir (ZOVIRAX) 800 MG tablet Take 800 mg by mouth 2 (two) times daily.      Marland Kitchen gabapentin (NEURONTIN) 300 MG capsule Take 300 mg by mouth 3 (three) times daily.      . methadone (DOLOPHINE) 10 MG tablet Take 10 mg by mouth 2 (two) times daily.       No current facility-administered medications on file prior to visit.    Review of Systems:  As per HPI, otherwise negative.    Physical Examination: Filed Vitals:   12/22/12 1346  BP: 100/72  Pulse: 70  Temp: 98.3 F (36.8 C)  Resp: 16   Filed Vitals:   12/22/12 1346  Height: 6' (1.829 m)  Weight: 226 lb 9.6 oz (102.785 kg)   Body mass index is 30.73 kg/(m^2). Ideal Body Weight: Weight in (lb) to have BMI = 25:  183.9   GEN: WDWN, NAD, Non-toxic, Alert & Oriented x 3 HEENT: Atraumatic, Normocephalic.  Ears and Nose: No external deformity. EXTR: No clubbing/cyanosis/edema NEURO: Normal gait.  PSYCH: Normally interactive. Conversant. Not depressed or anxious appearing.  Calm demeanor.  SKIN:  Scabbed lesion no foreign body  Assessment and Plan: Tick bite Doxy Follow up as needed   Signed,  Phillips Odor, MD

## 2012-12-22 NOTE — Patient Instructions (Addendum)
Deer Tick Bite Deer ticks are brown arachnids (spider family) that vary in size from as small as the head of a pin to 1/4 inch (1/2 cm) diameter. They thrive in wooded areas. Deer are the preferred host of adult deer ticks. Small rodents are the host of young ticks (nymphs). When a person walks in a field or wooded area, young and adult ticks in the surrounding grass and vegetation can attach themselves to the skin. They can suck blood for hours to days if unnoticed. Ticks are found all over the [place] Some ticks carry a specific bacteria (Borrelia burgdorferi) that causes an infection called Lyme disease. The bacteria is typically passed into a person during the blood sucking process. This happens after the tick has been attached for at least a number of hours. While ticks can be found all over the [place] those carrying the bacteria that causes Lyme disease are most common in [place] and the [place]. Only a small proportion of ticks in these areas carry the Lyme disease bacteria and cause human infections. Ticks usually attach to warm spots on the body, such as the:  Head.  Back.  Neck.  Armpits.  Groin. SYMPTOMS  Most of the time, a deer tick bite will not be felt. You may or may not see the attached tick. You may notice mild irritation or redness around the bite site. If the deer tick passes the Lyme disease bacteria to a person, a round, red rash may be noticed 2 to 3 days after the bite. The rash may be clear in the middle, like a bull's-eye or target. If not treated, other symptoms may develop several days to weeks after the onset of the rash. These symptoms may include:  New rash lesions.  Fatigue and weakness.  General ill feeling and achiness.  Chills.  Headache and neck pain.  Swollen lymph glands.  Sore muscles and joints. 5 to 15% of untreated people with Lyme disease may develop more severe illnesses after several weeks to months. This may include inflammation of the  brain lining (meningitis), nerve palsies, an abnormal heartbeat, or severe muscle and joint pain and inflammation (myositis or arthritis). DIAGNOSIS   Physical exam and medical history.  Viewing the tick if it was saved for confirmation.  Blood tests (to check or confirm the presence of Lyme disease). TREATMENT  Most ticks do not carry disease. If found, an attached tick should be removed using tweezers. Tweezers should be placed under the body of the tick so it is removed by its attachment parts (pincers). If there are signs or symptoms of being sick, or Lyme disease is confirmed, medicines (antibiotics) that kill germs are usually prescribed. In more severe cases, antibiotics may be given through an intravenous (IV) access. HOME CARE INSTRUCTIONS   Always remove ticks with tweezers. Do not use petroleum jelly or other methods to kill or remove the tick. Slide the tweezers under the body and pull out as much as you can. If you are not sure what it is, save it in a jar and show your caregiver.  Once you remove the tick, the skin will heal on its own. Wash your hands and the affected area with water and soap. You may place a bandage on the affected area.  Take medicine as directed. You may be advised to take a full course of antibiotics.  Follow up with your caregiver as recommended. FINDING OUT THE RESULTS OF YOUR TEST Not all test results are available   be advised to take a full course of antibiotics.   Follow up with your caregiver as recommended.  FINDING OUT THE RESULTS OF YOUR TEST  Not all test results are available during your visit. If your test results are not back during the visit, make an appointment with your caregiver to find out the results. Do not assume everything is normal if you have not heard from your caregiver or the medical facility. It is important for you to follow up on all of your test results.  PROGNOSIS   If Lyme disease is confirmed, early treatment with antibiotics is very effective. Following preventive guidelines is important since it is possible to get the disease more than once.  PREVENTION    Wear long sleeves and long pants in  wooded or grassy areas. Tuck your pants into your socks.   Use an insect repellent while hiking.   Check yourself, your children, and your pets regularly for ticks after playing outside.   Clear piles of leaves or brush from your yard. Ticks might live there.  SEEK MEDICAL CARE IF:    You or your child has an oral temperature above 102 F (38.9 C).   You develop a severe headache following the bite.   You feel generally ill.   You notice a rash.   You are having trouble removing the tick.   The bite area has red skin or yellow drainage.  SEEK IMMEDIATE MEDICAL CARE IF:    Your face is weak and droopy or you have other neurological symptoms.   You have severe joint pain or weakness.  MAKE SURE YOU:    Understand these instructions.   Will watch your condition.   Will get help right away if you are not doing well or get worse.  FOR MORE INFORMATION  Centers for Disease Control and Prevention: www.cdc.gov  American Academy of Family Physicians: www.aafp.org  Document Released: 10/19/2009 Document Revised: 10/17/2011 Document Reviewed: 10/19/2009  ExitCare Patient Information 2013 [employer].

## 2013-02-24 ENCOUNTER — Emergency Department (HOSPITAL_COMMUNITY): Payer: BC Managed Care – PPO

## 2013-02-24 ENCOUNTER — Emergency Department (HOSPITAL_COMMUNITY)
Admission: EM | Admit: 2013-02-24 | Discharge: 2013-02-24 | Disposition: A | Discharge status: Home / Self Care | Payer: BC Managed Care – PPO | Attending: Emergency Medicine | Admitting: Emergency Medicine

## 2013-02-24 ENCOUNTER — Encounter (HOSPITAL_COMMUNITY): Payer: Self-pay | Admitting: *Deleted

## 2013-02-24 DIAGNOSIS — S61219A Laceration without foreign body of unspecified finger without damage to nail, initial encounter: Secondary | ICD-10-CM

## 2013-02-24 DIAGNOSIS — Y929 Unspecified place or not applicable: Secondary | ICD-10-CM | POA: Insufficient documentation

## 2013-02-24 DIAGNOSIS — Z859 Personal history of malignant neoplasm, unspecified: Secondary | ICD-10-CM | POA: Insufficient documentation

## 2013-02-24 DIAGNOSIS — I10 Essential (primary) hypertension: Secondary | ICD-10-CM | POA: Insufficient documentation

## 2013-02-24 DIAGNOSIS — E119 Type 2 diabetes mellitus without complications: Secondary | ICD-10-CM | POA: Insufficient documentation

## 2013-02-24 DIAGNOSIS — S61209A Unspecified open wound of unspecified finger without damage to nail, initial encounter: Secondary | ICD-10-CM | POA: Insufficient documentation

## 2013-02-24 DIAGNOSIS — Z8669 Personal history of other diseases of the nervous system and sense organs: Secondary | ICD-10-CM | POA: Insufficient documentation

## 2013-02-24 DIAGNOSIS — Z79899 Other long term (current) drug therapy: Secondary | ICD-10-CM | POA: Insufficient documentation

## 2013-02-24 DIAGNOSIS — Y9389 Activity, other specified: Secondary | ICD-10-CM | POA: Insufficient documentation

## 2013-02-24 HISTORY — DX: Malignant (primary) neoplasm, unspecified: C80.1

## 2013-02-24 MED ORDER — BUPIVACAINE HCL (PF) 0.5 % IJ SOLN
50.0000 mL | Freq: Once | INTRAMUSCULAR | Status: DC
  Filled 2013-02-24: qty 30

## 2013-02-24 NOTE — ED Provider Notes (Signed)
History    CSN: [id number] Arrival date & time 02/24/13  0119  First MD Initiated Contact with Patient 02/24/13 0141     Chief Complaint  Patient presents with  . Finger Injury   (Consider location/radiation/quality/duration/timing/severity/associated sxs/prior Treatment) HPI Comments: Accidentally close R index finger in car door now with laceration to palmar aspect distal finger   The history is provided by the patient.   Past Medical History  Diagnosis Date  . Hypertension   . Diabetes mellitus   . Bell's palsy   . Cancer    Past Surgical History  Procedure Laterality Date  . Splenectomy, total     No family history on file. History  Substance Use Topics  . Smoking status: Never Smoker   . Smokeless tobacco: Not on file  . Alcohol Use: Yes     Comment: occ    Review of Systems  Allergies  Oxycodone  Home Medications   Current Outpatient Rx  Name  Route  Sig  Dispense  Refill  . acyclovir (ZOVIRAX) 800 MG tablet   Oral   Take 800 mg by mouth 2 (two) times daily.         Marland Kitchen amLODipine (NORVASC) 10 MG tablet   Oral   Take 10 mg by mouth every morning.         . docusate sodium (COLACE) 100 MG capsule   Oral   Take 100 mg by mouth daily as needed for constipation.         . gabapentin (NEURONTIN) 300 MG capsule   Oral   Take 300 mg by mouth 3 (three) times daily.         . methadone (DOLOPHINE) 10 MG tablet   Oral   Take 10 mg by mouth 2 (two) times daily.         . penicillin v potassium (VEETID) 500 MG tablet   Oral   Take 500 mg by mouth 2 (two) times daily.         Marland Kitchen pyridOXINE (VITAMIN B-6) 100 MG tablet   Oral   Take 100 mg by mouth every morning.           BP 108/76  Pulse 69  Temp(Src) 98.8 F (37.1 C) (Oral)  Resp 18  Ht 6' (1.829 m)  Wt 225 lb (102.059 kg)  BMI 30.51 kg/m2  SpO2 97% Physical Exam  Constitutional: He is oriented to person, place, and time. He appears well-developed.  HENT:  Head:  Normocephalic.  Eyes: Pupils are equal, round, and reactive to light.  Cardiovascular: Normal rate.   Pulmonary/Chest: Effort normal.  Musculoskeletal: Normal range of motion. He exhibits tenderness. He exhibits no edema.       Hands: Neurological: He is alert and oriented to person, place, and time.  Skin: Skin is warm.    ED Course  LACERATION REPAIR Date/Time: 02/24/2013 3:08 AM Performed by: Arman Filter Authorized by: Arman Filter Consent: Verbal consent obtained. Risks and benefits: risks, benefits and alternatives were discussed Consent given by: patient Patient understanding: patient states understanding of the procedure being performed Patient identity confirmed: verbally with patient Body area: upper extremity Location details: right index finger Laceration length: 1.5 cm Foreign bodies: no foreign bodies Tendon involvement: none Nerve involvement: none Vascular damage: no Anesthesia: digital block Local anesthetic: bupivacaine 0.25% without epinephrine Anesthetic total: 2 ml Patient sedated: no Preparation: Patient was prepped and draped in the usual sterile fashion. Irrigation solution: saline Amount of cleaning:  standard Debridement: none Degree of undermining: none Skin closure: 4-0 nylon Number of sutures: 7 Technique: simple Approximation: close Dressing: antibiotic ointment   (including critical care time) Labs Reviewed - No data to display Dg Finger Index Right  02/24/2013   *RADIOLOGY REPORT*  Clinical Data: Finger injury  RIGHT INDEX FINGER 2+V  Comparison: None.  Findings: Irregularity of the soft tissues of the dorsal aspect of the right index finger is likely related new laceration.  No radiopaque foreign body identified.  There is no acute fracture or dislocation.  Osseous mineralization is normal.  Joint spaces are preserved.  IMPRESSION: Soft tissue laceration without acute fracture or dislocation.   Original Report Authenticated By: Rise Mu, M.D.   1. Finger laceration, initial encounter     MDM  Finger laceration No Fx on xray   Arman Filter, NP 02/24/13 0310

## 2013-02-24 NOTE — ED Notes (Signed)
Pt shut rt index finger in car door; laceration to rt index finger

## 2013-02-24 NOTE — ED Provider Notes (Signed)
Medical screening examination/treatment/procedure(s) were performed by non-physician practitioner and as supervising physician I was immediately available for consultation/collaboration.  Chenelle Benning M Siriyah Ambrosius, MD 02/24/13 0620 

## 2013-03-09 ENCOUNTER — Ambulatory Visit (INDEPENDENT_AMBULATORY_CARE_PROVIDER_SITE_OTHER): Payer: BC Managed Care – PPO | Admitting: Physician Assistant

## 2013-03-09 VITALS — BP 122/68 | HR 70 | Temp 98.8°F | Resp 16 | Ht 70.5 in | Wt 220.6 lb

## 2013-03-09 DIAGNOSIS — S61209A Unspecified open wound of unspecified finger without damage to nail, initial encounter: Secondary | ICD-10-CM

## 2013-03-09 NOTE — Patient Instructions (Signed)
Wash the finger at least daily with soap and water. You may apply a thin layer of antibiotic ointment to the wound if you like. Cover the wound only when it may get dirty, otherwise leave it open to the air. Return if you develop increasing pain, swelling, redness or drainage from the wound, or if you develop a fever.

## 2013-03-09 NOTE — Progress Notes (Signed)
  Subjective:    Patient ID: Cory Marks, male    DOB: [birth year], 52 y.o.   MRN: [id number]  HPI This 52 y.o. male presents for suture removal.  Sutures were placed to close a wound on the RIGHT index finger in the ED on 02/24/2013.  2 of the original 7 sutures have already come out on their own.  The area is tender when he bumps it.  No bleeding, redness, swelling or purulence.  Past medical history,  and problem list reviewed. Allergies and medications reviewed. ED note reviewed.   Review of Systems As above.    Objective:   Physical Exam BP 122/68  Pulse 70  Temp(Src) 98.8 F (37.1 C) (Oral)  Resp 16  Ht 5' 10.5" (1.791 m)  Wt 220 lb 9.6 oz (100.064 kg)  BMI 31.2 kg/m2  SpO2 100% WDWNBM, A&O x 3.  #5 SI Ethilon sutures remain.  No erythema, streaking or drainage.  Hyperkeratotic skin of the pad of the finger shows evidence of maceration (consistent with keeping the wound covered at almost all times), and the deeper tissue is well healed.  Mildly tender on palpation.  No induration. Sutures removed without incident.       Assessment & Plan:  Wound, open, finger  - local wound care.  Anticipatory guidance.  Fernande Bras, PA-C Physician Assistant-Certified [facility]

## 2013-04-22 ENCOUNTER — Encounter (HOSPITAL_BASED_OUTPATIENT_CLINIC_OR_DEPARTMENT_OTHER): Payer: BC Managed Care – PPO | Attending: General Surgery

## 2013-04-22 DIAGNOSIS — L89899 Pressure ulcer of other site, unspecified stage: Secondary | ICD-10-CM | POA: Insufficient documentation

## 2013-04-22 DIAGNOSIS — Z9221 Personal history of antineoplastic chemotherapy: Secondary | ICD-10-CM | POA: Insufficient documentation

## 2013-04-22 DIAGNOSIS — Z79899 Other long term (current) drug therapy: Secondary | ICD-10-CM | POA: Insufficient documentation

## 2013-04-22 DIAGNOSIS — L8990 Pressure ulcer of unspecified site, unspecified stage: Secondary | ICD-10-CM | POA: Insufficient documentation

## 2013-04-22 DIAGNOSIS — Z87898 Personal history of other specified conditions: Secondary | ICD-10-CM | POA: Insufficient documentation

## 2013-04-22 DIAGNOSIS — M216X9 Other acquired deformities of unspecified foot: Secondary | ICD-10-CM | POA: Insufficient documentation

## 2013-04-23 NOTE — Progress Notes (Signed)
[facility]  NAME:  Cory Marks, Cory Marks NO.:  [id number]  MEDICAL RECORD NO.:  [id number]      DATE OF BIRTH:  02-23-1961  PHYSICIAN:  Ardath Sax, M.D.           VISIT DATE:                                  OFFICE VISIT   This is a 52 year old African American male who has quite a long medical history.  He apparently injured his right knee during a sporting event and did some sort of nerve damage to his leg which developed into a complete foot drop.  He has since had multiple surgeries to try to correct this which have been unsuccessful.  He also has had a history of mantle cell lymphoma, and he has been on extensive chemotherapy.  At the present time, his medicines include methadone, acyclovir, gabapentin, Norvasc, and vitamins.  He does not have any allergies, and when he was examined here, he was found to have a blood pressure 129/87, respirations 18, pulse 55, temperature 97.6.  He is 6 feet tall, weighs 226 pounds.  He presumably because of the foot drop has developed a pressure ulcer on the plantar aspect of his right great toe.  It is almost a centimeter in diameter.  It has been treated by a podiatrist and they have been putting out a foam on it, and apparently, it has not gotten any better.  We are going to treat him with Silvercel collagen and a Darco shoe, and we will see him back in a week.  DIAGNOSES:  Pressure ulcer, right great toe; history of foot drop; and history of lymphoma.     Ardath Sax, M.D.     PP/MEDQ  D:  04/22/2013  T:  04/23/2013  Job:  [id number]

## 2013-05-13 ENCOUNTER — Encounter (HOSPITAL_BASED_OUTPATIENT_CLINIC_OR_DEPARTMENT_OTHER): Payer: Medicare Other | Attending: General Surgery

## 2013-05-13 DIAGNOSIS — E1169 Type 2 diabetes mellitus with other specified complication: Secondary | ICD-10-CM | POA: Insufficient documentation

## 2013-05-13 DIAGNOSIS — L97509 Non-pressure chronic ulcer of other part of unspecified foot with unspecified severity: Secondary | ICD-10-CM | POA: Insufficient documentation

## 2013-05-14 NOTE — Progress Notes (Signed)
[facility]  NAME:  Cory Marks, Cory Marks                    ACCOUNT NO.:  MEDICAL RECORD NO.:  [id number]      DATE OF BIRTH:  March 06, 1961  PHYSICIAN:  Ardath Sax, M.D.           VISIT DATE:                                  OFFICE VISIT   He is a 52 year old African American male who has been coming here with what we reported as a neuropathogenic foot ulcer on this volar aspect of his right great toe.  We have been treating it with Apligraf and multiple debridements.  The ulcers are about a cm in diameter.  Since that he has been coming here, we felt that this looks so much like a diabetic ulcer.  We have since worked him up for diabetes and he does have an elevated A1c and a blood sugar today which was 140.  Therefore he will fall under the classification of a diabetic foot ulcer, Wagner 3, and we are applying to put on an Apligraf which I think will speed up the healing process remarkably.  We would appreciate if he would give [place] permission to order him an Apligraf.     Ardath Sax, M.D.     PP/MEDQ  D:  05/13/2013  T:  05/14/2013  Job:  [id number]

## 2013-05-21 NOTE — Progress Notes (Signed)
[facility]  NAME:  Cory Marks, Cory Marks NO.:  MEDICAL RECORD NO.:  [id number]      DATE OF BIRTH:  [birth year]  PHYSICIAN:  Ardath Sax, M.D.           VISIT DATE:                                  OFFICE VISIT   [name] is a 52 year old gentleman, who is a diabetic and has a diabetic foot ulcer on the plantar aspect of his right great toe.  It is about a centimeter in diameter and has failed usual care with debridement, Santyl, and we progress silver alginate in silver collagen. It is remained about the same.  An x-ray was taken, which is a questionable osteo, but I do not see any definite evidence of osteomyelitis.  He has been on antibiotics, doxycycline 100 mg twice a day.  I would like to use both hyperbaric oxygen and Apligraf's on this gentleman's foot as I want to absolutely give him a best chance for limb salvage and to prevent any further complications with this Wagner 3 diabetic foot ulcer.     Ardath Sax, M.D.     PP/MEDQ  D:  05/20/2013  T:  05/21/2013  Job:  [id number]

## 2013-06-10 ENCOUNTER — Encounter (HOSPITAL_BASED_OUTPATIENT_CLINIC_OR_DEPARTMENT_OTHER): Payer: Medicare Other | Attending: General Surgery

## 2013-06-10 DIAGNOSIS — E1169 Type 2 diabetes mellitus with other specified complication: Secondary | ICD-10-CM | POA: Insufficient documentation

## 2013-06-10 DIAGNOSIS — L97509 Non-pressure chronic ulcer of other part of unspecified foot with unspecified severity: Secondary | ICD-10-CM | POA: Insufficient documentation

## 2013-06-10 DIAGNOSIS — L84 Corns and callosities: Secondary | ICD-10-CM | POA: Insufficient documentation

## 2013-07-08 ENCOUNTER — Encounter (HOSPITAL_BASED_OUTPATIENT_CLINIC_OR_DEPARTMENT_OTHER): Payer: Medicare Other | Attending: General Surgery

## 2013-07-08 DIAGNOSIS — L84 Corns and callosities: Secondary | ICD-10-CM | POA: Insufficient documentation

## 2013-07-08 DIAGNOSIS — L97509 Non-pressure chronic ulcer of other part of unspecified foot with unspecified severity: Secondary | ICD-10-CM | POA: Insufficient documentation

## 2013-09-27 ENCOUNTER — Encounter (HOSPITAL_BASED_OUTPATIENT_CLINIC_OR_DEPARTMENT_OTHER): Payer: Medicare HMO | Attending: General Surgery

## 2013-09-27 DIAGNOSIS — Z87898 Personal history of other specified conditions: Secondary | ICD-10-CM | POA: Insufficient documentation

## 2013-09-27 DIAGNOSIS — G609 Hereditary and idiopathic neuropathy, unspecified: Secondary | ICD-10-CM | POA: Insufficient documentation

## 2013-09-27 DIAGNOSIS — Z9089 Acquired absence of other organs: Secondary | ICD-10-CM | POA: Insufficient documentation

## 2013-09-27 DIAGNOSIS — Z9221 Personal history of antineoplastic chemotherapy: Secondary | ICD-10-CM | POA: Insufficient documentation

## 2013-09-27 DIAGNOSIS — E1169 Type 2 diabetes mellitus with other specified complication: Secondary | ICD-10-CM | POA: Insufficient documentation

## 2013-09-27 DIAGNOSIS — Z79899 Other long term (current) drug therapy: Secondary | ICD-10-CM | POA: Insufficient documentation

## 2013-09-27 DIAGNOSIS — I10 Essential (primary) hypertension: Secondary | ICD-10-CM | POA: Insufficient documentation

## 2013-09-27 DIAGNOSIS — L97509 Non-pressure chronic ulcer of other part of unspecified foot with unspecified severity: Secondary | ICD-10-CM | POA: Insufficient documentation

## 2013-10-01 NOTE — Progress Notes (Signed)
[facility]  NAME:  Cory Marks, Cory Marks                    ACCOUNT NO.:  MEDICAL RECORD NO.:  [id number]      DATE OF BIRTH:  [birth year]  PHYSICIAN:  Judene Companion, M.D.           VISIT DATE:                                  OFFICE VISIT   He is a 53 year old gentleman who comes to [place] because of a diabetic ulcer on the plantar aspect of his right great toe that is about a centimeter in diameter.  I am classifying it as a Wagner 3.  This gentleman does have diabetes and hypertension, but today his blood pressure was only 126/79, respirations 18, pulse 46, temperature 98.  He weighs 230 pounds.  He is on acyclovir, gabapentin, Norvasc, vitamins, and he is on doxycycline recently by his family doctor.  I also went over all of his past history, and he tells me that he had a mantle cell lymphoma that was treated with chemo successfully but it did leave him with peripheral neuropathy.  He does have hypertension and he did have splenectomy at that time when he had the mantle cell lymphoma.  We decided to put the E-Z  cast on and we put some collagen on his ulcer itself.  He will come back here in a week.  So, his diagnosis is, 1. Diabetic foot ulcer on the plantar aspect of his right great toe. 2. Diabetes. 3. History of mantle cell lymphoma. 4. Hypertension.     Judene Companion, M.D.     PP/MEDQ  D:  09/27/2013  T:  09/28/2013  Job:  [id number]

## 2013-10-11 ENCOUNTER — Encounter (HOSPITAL_BASED_OUTPATIENT_CLINIC_OR_DEPARTMENT_OTHER): Payer: Medicare HMO | Attending: General Surgery

## 2013-10-11 DIAGNOSIS — G609 Hereditary and idiopathic neuropathy, unspecified: Secondary | ICD-10-CM | POA: Insufficient documentation

## 2013-10-11 DIAGNOSIS — L97509 Non-pressure chronic ulcer of other part of unspecified foot with unspecified severity: Secondary | ICD-10-CM | POA: Insufficient documentation

## 2013-11-08 ENCOUNTER — Encounter (HOSPITAL_BASED_OUTPATIENT_CLINIC_OR_DEPARTMENT_OTHER): Payer: Medicare HMO | Attending: General Surgery

## 2013-11-08 DIAGNOSIS — L97509 Non-pressure chronic ulcer of other part of unspecified foot with unspecified severity: Secondary | ICD-10-CM | POA: Insufficient documentation

## 2013-11-08 DIAGNOSIS — G5790 Unspecified mononeuropathy of unspecified lower limb: Secondary | ICD-10-CM | POA: Insufficient documentation

## 2013-12-06 ENCOUNTER — Encounter (HOSPITAL_BASED_OUTPATIENT_CLINIC_OR_DEPARTMENT_OTHER): Payer: Medicare HMO | Attending: General Surgery

## 2013-12-06 DIAGNOSIS — L97509 Non-pressure chronic ulcer of other part of unspecified foot with unspecified severity: Secondary | ICD-10-CM | POA: Insufficient documentation

## 2013-12-06 DIAGNOSIS — E1169 Type 2 diabetes mellitus with other specified complication: Secondary | ICD-10-CM | POA: Insufficient documentation

## 2013-12-23 ENCOUNTER — Encounter: Payer: Self-pay | Admitting: Family Medicine

## 2013-12-23 ENCOUNTER — Ambulatory Visit (INDEPENDENT_AMBULATORY_CARE_PROVIDER_SITE_OTHER): Payer: Medicare HMO | Admitting: Family Medicine

## 2013-12-23 VITALS — BP 125/92 | HR 60 | Temp 98.8°F | Resp 20 | Ht 72.0 in | Wt 225.0 lb

## 2013-12-23 DIAGNOSIS — Z87898 Personal history of other specified conditions: Secondary | ICD-10-CM

## 2013-12-23 DIAGNOSIS — C8589 Other specified types of non-Hodgkin lymphoma, extranodal and solid organ sites: Secondary | ICD-10-CM

## 2013-12-23 DIAGNOSIS — C8590 Non-Hodgkin lymphoma, unspecified, unspecified site: Secondary | ICD-10-CM

## 2013-12-23 DIAGNOSIS — I10 Essential (primary) hypertension: Secondary | ICD-10-CM

## 2013-12-23 DIAGNOSIS — Z1211 Encounter for screening for malignant neoplasm of colon: Secondary | ICD-10-CM

## 2013-12-23 DIAGNOSIS — Z8679 Personal history of other diseases of the circulatory system: Secondary | ICD-10-CM

## 2013-12-23 NOTE — Progress Notes (Signed)
Subjective:    Patient ID: Cory Marks, male    DOB: 1961/01/29, 53 y.o.   MRN: [id number]  HPI  Patient presents to establish care. Was a patient of Michaelene Song, in [place], [place], physician recently retired. Patient has a history of mantle cell lymphoma that is followed by Dr. Claris Che at [facility]. Patient was treated with chemotherapy and  is followed by oncologist every 6 months.     Review of Systems  Constitutional: Negative.   HENT: Negative.   Eyes: Negative.   Respiratory: Negative.   Cardiovascular: Negative.   Gastrointestinal: Negative.   Endocrine: Negative.   Musculoskeletal: Negative for arthralgias, back pain and gait problem.       Right great toe pain  Skin: Negative.   Allergic/Immunologic: Negative.   Neurological: Positive for numbness (peripheral neuropathy to lower extremities).  Hematological: Negative.   Psychiatric/Behavioral: Negative.        Objective:   Physical Exam  Constitutional: He is oriented to person, place, and time. Vital signs are normal. He appears well-developed and well-nourished.  HENT:  Head: Normocephalic and atraumatic.  Eyes: Conjunctivae and lids are normal. Pupils are equal, round, and reactive to light. Lids are everted and swept, no foreign bodies found.  Neck: Normal range of motion. Neck supple.  Cardiovascular: Normal rate, regular rhythm and normal heart sounds.   Pulmonary/Chest: Effort normal. Not bradypneic. No respiratory distress. He has no decreased breath sounds.  Abdominal: Soft. Normal appearance. There is no tenderness.  Musculoskeletal: Normal range of motion.  Lymphadenopathy:       Head (right side): No submental and no submandibular adenopathy present.       Head (left side): No submental and no submandibular adenopathy present.       Right cervical: No superficial cervical adenopathy present.      Left cervical: No superficial cervical adenopathy present.  Neurological: He is  alert and oriented to person, place, and time.  Skin: Skin is warm, dry and intact. Nails show no clubbing.     Psychiatric: He has a normal mood and affect. His speech is normal and behavior is normal. Thought content normal.          Assessment & Plan:  1. Lymphoma: Patient maintains that he was diagnosed with Mantle cell lymphoma 2012/2013, reports that he was followed by Dr. Herd/ chemotherapy. Followed every 6 months. Reports that he had blood drawn 3 weeks ago. Patient has a right port a cath. Will review medical records as they become available.     2. Hypertension: Stable on Norvasc. Patient reports that he watches his daily sodium intake. Recommend that patient increases daily water intake.   3. Peripheral neuropathy: Patient states that he has a history of peripheral neuropathy due to chemotherapy. Reports that the nerve damage has resulted in a non healing right great toe blister. The patient has on a leg cast to decrease pressure and promote healing in the right great toe. Patient is being followed by the [facility], next appt is on 12/27/2013.   4. Chronic pain: Patient states that he has chronic right great toe pain that is controlled by Methadone 10 mg twice daily managed by Dr. Claris Che.  5. H/O Bradycardia: Will return in 1 month for EKG. Patient has appt scheduled with    Preventative: Up to date on immunizations   Colonoscopy: Schedule colonoscopy with [facility], Dr. Benson Norway on 01/06/2014  CPE: 1 month with Dr. Zigmund Daniel Labs:  1 week prior

## 2013-12-24 ENCOUNTER — Telehealth: Payer: Self-pay

## 2013-12-24 NOTE — Telephone Encounter (Signed)
Pt contacted office and recieved his Appointment time and Date to be seen w/[facility]. Appointment will be with Dr.Hung 01/06/2014@ 9AM

## 2013-12-24 NOTE — Telephone Encounter (Signed)
Pt 's referral was set up through [facility] to be seen on 01/06/2014@9  AM Pt's paperwork has been faxed over and Pt will be contacted of date and time of Appointment

## 2013-12-26 DIAGNOSIS — Z87898 Personal history of other specified conditions: Secondary | ICD-10-CM | POA: Insufficient documentation

## 2013-12-26 DIAGNOSIS — Z1211 Encounter for screening for malignant neoplasm of colon: Secondary | ICD-10-CM | POA: Insufficient documentation

## 2013-12-26 DIAGNOSIS — Z8679 Personal history of other diseases of the circulatory system: Secondary | ICD-10-CM

## 2014-01-10 ENCOUNTER — Encounter (HOSPITAL_BASED_OUTPATIENT_CLINIC_OR_DEPARTMENT_OTHER): Payer: Medicare HMO | Attending: General Surgery

## 2014-01-10 DIAGNOSIS — G589 Mononeuropathy, unspecified: Secondary | ICD-10-CM | POA: Insufficient documentation

## 2014-01-10 DIAGNOSIS — L97509 Non-pressure chronic ulcer of other part of unspecified foot with unspecified severity: Secondary | ICD-10-CM | POA: Insufficient documentation

## 2014-01-10 DIAGNOSIS — E1169 Type 2 diabetes mellitus with other specified complication: Secondary | ICD-10-CM | POA: Insufficient documentation

## 2014-01-10 DIAGNOSIS — L84 Corns and callosities: Secondary | ICD-10-CM | POA: Insufficient documentation

## 2014-01-17 ENCOUNTER — Other Ambulatory Visit: Payer: Medicare HMO

## 2014-01-17 DIAGNOSIS — I10 Essential (primary) hypertension: Secondary | ICD-10-CM

## 2014-01-17 LAB — CBC WITH DIFFERENTIAL/PLATELET
BASOS PCT: 1 % (ref 0–1)
Basophils Absolute: 0.1 10*3/uL (ref 0.0–0.1)
EOS ABS: 0.2 10*3/uL (ref 0.0–0.7)
EOS PCT: 4 % (ref 0–5)
HEMATOCRIT: 39.5 % (ref 39.0–52.0)
Hemoglobin: 13.5 g/dL (ref 13.0–17.0)
LYMPHS PCT: 42 % (ref 12–46)
Lymphs Abs: 2.2 10*3/uL (ref 0.7–4.0)
MCH: 31.3 pg (ref 26.0–34.0)
MCHC: 34.2 g/dL (ref 30.0–36.0)
MCV: 91.6 fL (ref 78.0–100.0)
MONO ABS: 0.5 10*3/uL (ref 0.1–1.0)
Monocytes Relative: 9 % (ref 3–12)
Neutro Abs: 2.3 10*3/uL (ref 1.7–7.7)
Neutrophils Relative %: 44 % (ref 43–77)
PLATELETS: 203 10*3/uL (ref 150–400)
RBC: 4.31 MIL/uL (ref 4.22–5.81)
RDW: 15 % (ref 11.5–15.5)
WBC: 5.3 10*3/uL (ref 4.0–10.5)

## 2014-01-18 LAB — LIPID PANEL
Cholesterol: 244 mg/dL — ABNORMAL HIGH (ref 0–200)
HDL: 53 mg/dL (ref >39)
LDL CALC: 150 mg/dL — AB (ref 0–99)
Total CHOL/HDL Ratio: 4.6 Ratio
Triglycerides: 207 mg/dL — ABNORMAL HIGH (ref <150)
VLDL: 41 mg/dL — ABNORMAL HIGH (ref 0–40)

## 2014-01-18 LAB — COMPLETE METABOLIC PANEL WITH GFR
ALT: 18 U/L (ref 0–53)
AST: 23 U/L (ref 0–37)
Albumin: 4.3 g/dL (ref 3.5–5.2)
Alkaline Phosphatase: 49 U/L (ref 39–117)
BILIRUBIN TOTAL: 0.5 mg/dL (ref 0.2–1.2)
BUN: 18 mg/dL (ref 6–23)
CHLORIDE: 100 meq/L (ref 96–112)
CO2: 24 mEq/L (ref 19–32)
Calcium: 9.2 mg/dL (ref 8.4–10.5)
Creat: 1.7 mg/dL — ABNORMAL HIGH (ref 0.50–1.35)
GFR, EST AFRICAN AMERICAN: 52 mL/min — AB
GFR, Est Non African American: 45 mL/min — ABNORMAL LOW
Glucose, Bld: 76 mg/dL (ref 70–99)
Potassium: 3.7 mEq/L (ref 3.5–5.3)
SODIUM: 138 meq/L (ref 135–145)
Total Protein: 6.3 g/dL (ref 6.0–8.3)

## 2014-01-23 ENCOUNTER — Encounter: Payer: Self-pay | Admitting: Internal Medicine

## 2014-01-23 ENCOUNTER — Ambulatory Visit (INDEPENDENT_AMBULATORY_CARE_PROVIDER_SITE_OTHER): Payer: Medicare HMO | Admitting: Internal Medicine

## 2014-01-23 VITALS — BP 133/100 | HR 90 | Temp 98.2°F | Resp 20 | Ht 72.0 in

## 2014-01-23 DIAGNOSIS — Z0000 Encounter for general adult medical examination without abnormal findings: Secondary | ICD-10-CM | POA: Diagnosis not present

## 2014-01-23 DIAGNOSIS — L97509 Non-pressure chronic ulcer of other part of unspecified foot with unspecified severity: Secondary | ICD-10-CM

## 2014-01-23 DIAGNOSIS — E785 Hyperlipidemia, unspecified: Secondary | ICD-10-CM

## 2014-01-23 DIAGNOSIS — E118 Type 2 diabetes mellitus with unspecified complications: Secondary | ICD-10-CM | POA: Diagnosis not present

## 2014-01-23 DIAGNOSIS — M21371 Foot drop, right foot: Secondary | ICD-10-CM | POA: Insufficient documentation

## 2014-01-23 DIAGNOSIS — E8881 Metabolic syndrome: Secondary | ICD-10-CM

## 2014-01-23 DIAGNOSIS — I10 Essential (primary) hypertension: Secondary | ICD-10-CM | POA: Diagnosis not present

## 2014-01-23 DIAGNOSIS — I2510 Atherosclerotic heart disease of native coronary artery without angina pectoris: Secondary | ICD-10-CM

## 2014-01-23 DIAGNOSIS — M216X9 Other acquired deformities of unspecified foot: Secondary | ICD-10-CM

## 2014-01-23 DIAGNOSIS — L97519 Non-pressure chronic ulcer of other part of right foot with unspecified severity: Secondary | ICD-10-CM | POA: Insufficient documentation

## 2014-01-23 LAB — HEMOGLOBIN A1C
Hgb A1c MFr Bld: 6.1 % — ABNORMAL HIGH (ref <5.7)
Mean Plasma Glucose: 128 mg/dL — ABNORMAL HIGH (ref <117)

## 2014-01-23 MED ORDER — ROSUVASTATIN CALCIUM 20 MG PO TABS: 20.0000 mg | ORAL_TABLET | Freq: Every day | ORAL | Status: DC

## 2014-01-23 NOTE — Progress Notes (Addendum)
Patient ID: Rafal Archuleta, male   DOB: 1960-11-07, 53 y.o.   MRN: [id number]   Zakk Borgen, is a 53 y.o. male  XIP:[id number]  [id number]:[id number]  DOB - [birth year]  CC:  Chief Complaint  Patient presents with  . CPE    Pt has complained of a headache on again off again for couple days.  . Hypertension    ORTHOSTATIC B/P HAS BEEN PEFORMED ON PT TODAY       HPI: Kenroy Timberman is a 53 y.o. male here today for his Complete Physical Examination. He reports that he has no complaints. He has a history of DM II but reports that since losing weight with the mantle cell Lymphoma, he no longer has diabetes. He also reports that he is in remission from the Lymphoma after undergoing a Autologous Stem Cell transplant. I have reviewed his last note from Dr. Marcell Anger which confirms Lymphoma,  Autologous PBSCT, and splenectomy after rupture. A review of that note also notes that patient advised to stop Acyclovir but continue PCN. Pt also reports a remote history of right knee injury leading to peripheral nerve damage and foot drop for which he uses an AFO.  Patient has No headache, No chest pain, No abdominal pain - No Nausea, No new weakness tingling or numbness, No Cough - SOB.  Allergies  Allergen Reactions  . Oxycodone Itching   Past Medical History  Diagnosis Date  . Hypertension   . Diabetes mellitus   . Bell's palsy   . Cancer     Mantle Cell Cancer- S/P stem cell transplant   Current Outpatient Prescriptions on File Prior to Visit  Medication Sig Dispense Refill  . acyclovir (ZOVIRAX) 800 MG tablet Take 800 mg by mouth 2 (two) times daily.      Marland Kitchen amLODipine (NORVASC) 10 MG tablet Take 10 mg by mouth every morning.      . docusate sodium (COLACE) 100 MG capsule Take 100 mg by mouth daily as needed for constipation.      . gabapentin (NEURONTIN) 300 MG capsule Take 300 mg by mouth 3 (three) times daily.      . methadone (DOLOPHINE) 10 MG tablet Take 10 mg by mouth 2 (two) times daily.      .  penicillin v potassium (VEETID) 500 MG tablet Take 500 mg by mouth 2 (two) times daily.      Marland Kitchen pyridOXINE (VITAMIN B-6) 100 MG tablet Take 100 mg by mouth every morning.        No current facility-administered medications on file prior to visit.   Family History  Problem Relation Age of Onset  . Hypertension Mother   . Diabetes Mother   . Cancer Father   . Diabetes Father   . Heart disease Father   . Diabetes Sister    History   Social History  . Marital Status: Married    Spouse Name: N/A    Number of Children: N/A  . Years of Education: N/A   Occupational History  . Not on file.   Social History Main Topics  . Smoking status: Never Smoker   . Smokeless tobacco: Not on file  . Alcohol Use: 0.6 oz/week    1 Cans of beer per week     Comment: occ  . Drug Use: Yes    Special: Marijuana     Comment: Cocaine- Years ago, Marijuana 1 monht ago  . Sexual Activity: Yes    Partners: Female   Other Topics Concern  .  Not on file   Social History Narrative  . No narrative on file    Review of Systems: Constitutional: Negative for fever, chills, diaphoresis, activity change, appetite change and fatigue. HENT: Negative for ear pain, nosebleeds, congestion, facial swelling, rhinorrhea, neck pain, neck stiffness and ear discharge.  Eyes: Negative for pain, discharge, redness, itching and visual disturbance. Respiratory: Negative for cough, choking, chest tightness, shortness of breath, wheezing and stridor.  Cardiovascular: Negative for chest pain, palpitations and leg swelling. Gastrointestinal: Negative for abdominal distention. Genitourinary: Negative for dysuria, urgency, frequency, hematuria, flank pain, decreased urine volume, difficulty urinating .  Musculoskeletal: Negative for back pain. Pt does have occasional right knee pain. Neurological: Negative for dizziness, tremors, seizures, syncope, facial asymmetry, speech difficulty, weakness, light-headedness, numbness and  headaches.  Hematological: Negative for adenopathy. Does not bruise/bleed easily. Psychiatric/Behavioral: Negative for hallucinations, behavioral problems, confusion, dysphoric mood, decreased concentration and agitation.    Objective:   Filed Vitals:   01/23/14 0833  BP: 133/100  Pulse: 90  Temp: 98.2 F (36.8 C)  Resp: 20    Physical Exam: Constitutional: Patient appears well-developed and well-nourished. No distress. HENT: Normocephalic, atraumatic, External right and left ear normal. Oropharynx is clear and moist.  Eyes: Conjunctivae and EOM are normal. PERRLA, no scleral icterus. Neck: Normal ROM. Neck supple. No JVD. No tracheal deviation. No thyromegaly. CVS: RRR, S1/S2 +, no murmurs, no gallops, no carotid bruit.  Pulmonary: Effort and breath sounds normal, no stridor, rhonchi, wheezes, rales.  Abdominal: Soft. BS +, no distension, tenderness, rebound or guarding.  Musculoskeletal: Normal range of motion. No edema and no tenderness.  Lymphadenopathy: No lymphadenopathy noted, cervical, inguinal or axillary Neuro: Alert. Normal reflexes, muscle tone coordination. No cranial nerve deficit. Skin: Skin is warm and dry. No rash noted. Not diaphoretic. No erythema. No pallor. Psychiatric: Normal mood and affect. Behavior, judgment, thought content normal.   12 Lead EKG: Bradycardia with 1st degree AVB  Lab Results  Component Value Date   WBC 5.3 01/17/2014   HGB 13.5 01/17/2014   HCT 39.5 01/17/2014   MCV 91.6 01/17/2014   PLT 203 01/17/2014   Lab Results  Component Value Date   CREATININE 1.70* 01/17/2014   BUN 18 01/17/2014   NA 138 01/17/2014   K 3.7 01/17/2014   CL 100 01/17/2014   CO2 24 01/17/2014    Lab Results  Component Value Date   HGBA1C  Value: 6.1 (NOTE)   The ADA recommends the following therapeutic goal for glycemic   control related to Hgb A1C measurement:   Goal of Therapy:   < 7.0% Hgb A1C   Reference: American Diabetes Association: Clinical Practice    Recommendations 2008, Diabetes Care,  2008, 31:(Suppl 1). 08/18/2008   Lipid Panel     Component Value Date/Time   CHOL 244* 01/17/2014 1015   TRIG 207* 01/17/2014 1015   HDL 53 01/17/2014 1015   CHOLHDL 4.6 01/17/2014 1015   VLDL 41* 01/17/2014 1015   LDLCALC 150* 01/17/2014 1015       Assessment and plan:   1. Hyperlipidemia 10 risk > 20%. Discussed this information in the context of personal and family history and have decided to imitate therapy. - rosuvastatin (CRESTOR) 20 MG tablet; Take 1 tablet (20 mg total) by mouth daily.  Dispense: 30 tablet; Refill: 2 - Hemoglobin A1C - Pt will have AST and ALT checked next month. He will follow up in 1 month and I will evaluate tolerance to medication. Refills will be  needed at that time.  2. HTN (hypertension) BP elevated today. However improved after some time in the office. I have asked patient to monitor BP at home and we will make further decisions about adjusting therapy at that time.  3. Annual physical exam -Performed testicular examination and no masses noted.  - Pt had colonoscopy on 01/15/2104. Report pending from specialist. - EKG: see above - Check U/A today - Immunization pending but will check with Oncology for contraindications in [facility].  4. Diabetes mellitus type 2, controlled, with complications - Pt has a history of DM II. He has not been evaluated for DM in several years. Additionally he has a poorly healing foot ulcer on right. - Will check Hb A1c  5. Foot ulcer, right - Likely associated with neuropathy - Continue management at wound center  6. Right foot drop Continue AFO.  7. CAD in native artery -PET scan in 05/30/2012 shows heavily calcified LAD   - Will refer to cardiology for possible cardiac evaluation.  - Hemoglobin A1C  8. Metabolic syndrome and History of DMII - orts H/O DM II so will check Hb A1c particularly in light of elevated lipids and TG and evidence of CAD. -  Hemoglobin A1C  9. CRF - Check U/A if proteinuria will check Urine Protein/Cr ratio. - Will start  ACE-I if any proteinuria   Return in about 1 month (around 02/22/2014) for HTN, Hyperlipidemia, Review of lab data.  The patient was given clear instructions to go to ER or return to medical center if symptoms don't improve, worsen or new problems develop. The patient verbalized understanding. The patient was told to call to get lab results if they haven't heard anything in the next week.     This note has been created with Surveyor, quantity. Any transcriptional errors are unintentional.   Total time spent 60 minutes [place]., MD [place], [place]   01/23/2014, 9:47 AM

## 2014-01-24 ENCOUNTER — Other Ambulatory Visit (HOSPITAL_COMMUNITY): Payer: Self-pay | Admitting: General Surgery

## 2014-01-24 ENCOUNTER — Ambulatory Visit (HOSPITAL_COMMUNITY)
Admission: RE | Admit: 2014-01-24 | Discharge: 2014-01-24 | Disposition: A | Discharge status: Home / Self Care | Payer: Medicare HMO | Source: Ambulatory Visit | Attending: General Surgery | Admitting: General Surgery

## 2014-01-24 DIAGNOSIS — S99919A Unspecified injury of unspecified ankle, initial encounter: Principal | ICD-10-CM

## 2014-01-24 DIAGNOSIS — X58XXXA Exposure to other specified factors, initial encounter: Secondary | ICD-10-CM | POA: Insufficient documentation

## 2014-01-24 DIAGNOSIS — M869 Osteomyelitis, unspecified: Secondary | ICD-10-CM

## 2014-01-24 DIAGNOSIS — S8990XA Unspecified injury of unspecified lower leg, initial encounter: Secondary | ICD-10-CM | POA: Insufficient documentation

## 2014-01-24 DIAGNOSIS — S99929A Unspecified injury of unspecified foot, initial encounter: Principal | ICD-10-CM

## 2014-01-30 ENCOUNTER — Telehealth: Payer: Self-pay

## 2014-01-30 NOTE — Telephone Encounter (Signed)
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   Will he have to pay the $75 again, since he paid it in February?   Call [phone]

## 2014-01-31 ENCOUNTER — Telehealth: Payer: Self-pay

## 2014-01-31 ENCOUNTER — Telehealth: Payer: Self-pay | Admitting: Internal Medicine

## 2014-01-31 DIAGNOSIS — E785 Hyperlipidemia, unspecified: Secondary | ICD-10-CM

## 2014-01-31 MED ORDER — ROSUVASTATIN CALCIUM 20 MG PO TABS: 20.0000 mg | ORAL_TABLET | Freq: Every day | ORAL | Status: DC

## 2014-01-31 NOTE — Telephone Encounter (Signed)
Crestor reordered electronically E-Prescribing Status: Receipt confirmed by pharmacy (01/31/2014 10:07 AM EDT)

## 2014-01-31 NOTE — Telephone Encounter (Signed)
Advised pt he must come into the office for another DOT PE.

## 2014-01-31 NOTE — Telephone Encounter (Signed)
Patient called regarding medication, refill but did not indicate which one. Left voice mail for patient to return call.

## 2014-02-06 ENCOUNTER — Encounter (HOSPITAL_BASED_OUTPATIENT_CLINIC_OR_DEPARTMENT_OTHER): Payer: Medicare HMO | Attending: General Surgery

## 2014-02-06 DIAGNOSIS — L97509 Non-pressure chronic ulcer of other part of unspecified foot with unspecified severity: Secondary | ICD-10-CM | POA: Insufficient documentation

## 2014-02-06 DIAGNOSIS — E1169 Type 2 diabetes mellitus with other specified complication: Secondary | ICD-10-CM | POA: Insufficient documentation

## 2014-02-06 DIAGNOSIS — G589 Mononeuropathy, unspecified: Secondary | ICD-10-CM | POA: Insufficient documentation

## 2014-02-14 DIAGNOSIS — E1169 Type 2 diabetes mellitus with other specified complication: Secondary | ICD-10-CM | POA: Diagnosis not present

## 2014-02-14 DIAGNOSIS — G589 Mononeuropathy, unspecified: Secondary | ICD-10-CM | POA: Diagnosis not present

## 2014-02-14 DIAGNOSIS — L97509 Non-pressure chronic ulcer of other part of unspecified foot with unspecified severity: Secondary | ICD-10-CM | POA: Diagnosis not present

## 2014-02-21 DIAGNOSIS — G589 Mononeuropathy, unspecified: Secondary | ICD-10-CM | POA: Diagnosis not present

## 2014-02-21 DIAGNOSIS — L97509 Non-pressure chronic ulcer of other part of unspecified foot with unspecified severity: Secondary | ICD-10-CM | POA: Diagnosis not present

## 2014-02-21 DIAGNOSIS — E1169 Type 2 diabetes mellitus with other specified complication: Secondary | ICD-10-CM | POA: Diagnosis not present

## 2014-02-27 ENCOUNTER — Ambulatory Visit (INDEPENDENT_AMBULATORY_CARE_PROVIDER_SITE_OTHER): Payer: Medicare HMO | Admitting: Internal Medicine

## 2014-02-27 VITALS — BP 141/99 | HR 55 | Temp 98.1°F | Resp 16 | Ht 72.0 in | Wt 238.0 lb

## 2014-02-27 DIAGNOSIS — E118 Type 2 diabetes mellitus with unspecified complications: Secondary | ICD-10-CM

## 2014-02-27 DIAGNOSIS — N183 Chronic kidney disease, stage 3 unspecified: Secondary | ICD-10-CM

## 2014-02-27 DIAGNOSIS — I10 Essential (primary) hypertension: Secondary | ICD-10-CM

## 2014-02-27 DIAGNOSIS — E785 Hyperlipidemia, unspecified: Secondary | ICD-10-CM

## 2014-02-27 MED ORDER — LISINOPRIL 10 MG PO TABS: 10.0000 mg | ORAL_TABLET | Freq: Every day | ORAL | Status: DC

## 2014-02-27 NOTE — Progress Notes (Signed)
Patient ID: Cory Marks, male   DOB: 18-May-1961, 53 y.o.   MRN: [id number]   Cory Marks, is a 53 y.o. male  PIR:[id number]  YTK:[id number]  DOB - [birth year]  CC:  Chief Complaint  Patient presents with  . Follow-up       HPI: Cory Marks is a 54 y.o. male here today for follow up. He reports that he has not yet received his Crestor due to financial constraints.  He has otherwise been feeling well. I have reviewed his labs and particularly his Hb A1c. I have discussed that as long as his BS remain controlled then he has decreased risk of macrovascular complications however there is still microvascular risk.   He reports that he has been compliant with his medications. He has not been monitoring BP at home.  Patient has No headache, No chest pain, No abdominal pain - No Nausea, No new weakness tingling or numbness, No Cough - SOB.  Allergies  Allergen Reactions  . Oxycodone Itching and Palpitations   Past Medical History  Diagnosis Date  . Hypertension   . Diabetes mellitus   . Bell's palsy   . Cancer     Mantle Cell Cancer- S/P stem cell transplant   Current Outpatient Prescriptions on File Prior to Visit  Medication Sig Dispense Refill  . amLODipine (NORVASC) 10 MG tablet Take 10 mg by mouth every morning.      . docusate sodium (COLACE) 100 MG capsule Take 100 mg by mouth daily as needed for constipation.      . gabapentin (NEURONTIN) 300 MG capsule Take 300 mg by mouth 3 (three) times daily.      . penicillin v potassium (VEETID) 500 MG tablet Take 500 mg by mouth 2 (two) times daily.      Marland Kitchen pyridOXINE (VITAMIN B-6) 100 MG tablet Take 100 mg by mouth every morning.       Marland Kitchen acyclovir (ZOVIRAX) 800 MG tablet Take 800 mg by mouth 2 (two) times daily.      . methadone (DOLOPHINE) 10 MG tablet Take 10 mg by mouth 2 (two) times daily.      . rosuvastatin (CRESTOR) 20 MG tablet Take 1 tablet (20 mg total) by mouth daily.  30 tablet  2   No current facility-administered  medications on file prior to visit.   Family History  Problem Relation Age of Onset  . Hypertension Mother   . Diabetes Mother   . Cancer Father   . Diabetes Father   . Heart disease Father   . Diabetes Sister    History   Social History  . Marital Status: Married    Spouse Name: N/A    Number of Children: N/A  . Years of Education: N/A   Occupational History  . Not on file.   Social History Main Topics  . Smoking status: Never Smoker   . Smokeless tobacco: Not on file  . Alcohol Use: 0.6 oz/week    1 Cans of beer per week     Comment: occ  . Drug Use: Yes    Special: Marijuana     Comment: Cocaine- Years ago, Marijuana 1 monht ago  . Sexual Activity: Yes    Partners: Female   Other Topics Concern  . Not on file   Social History Narrative  . No narrative on file    Review of Systems: Constitutional: Negative for fever, chills, diaphoresis, activity change, appetite change and fatigue. HENT: Negative for ear pain, nosebleeds, congestion,  facial swelling, rhinorrhea, neck pain, neck stiffness and ear discharge.  Eyes: Negative for pain, discharge, redness, itching and visual disturbance. Respiratory: Negative for cough, choking, chest tightness, shortness of breath, wheezing and stridor.  Cardiovascular: Negative for chest pain, palpitations and leg swelling. Gastrointestinal: Negative for abdominal distention. Genitourinary: Negative for dysuria, urgency, frequency, hematuria, flank pain, decreased urine volume, difficulty urinating and dyspareunia.  Musculoskeletal: Negative for back pain, joint swelling, arthralgia and gait problem. Neurological: Negative for dizziness, tremors, seizures, syncope, facial asymmetry, speech difficulty, weakness, light-headedness, numbness and headaches.  Hematological: Negative for adenopathy. Does not bruise/bleed easily. Psychiatric/Behavioral: Negative for hallucinations, behavioral problems, confusion, dysphoric mood, decreased  concentration and agitation.    Objective:      Filed Vitals:   02/27/14 0833  BP: 141/99  Pulse: 55  Temp: 98.1 F (36.7 C)  Resp: 16    Physical Exam: Constitutional: Patient appears well-developed and well-nourished. No distress. HENT: Normocephalic, atraumatic, External right and left ear normal. Oropharynx is clear and moist.  Eyes: Conjunctivae and EOM are normal. PERRLA, no scleral icterus. Oropharynx: Edentulous. No  Neck: Normal ROM. Neck supple. No JVD. No tracheal deviation. No thyromegaly. CVS: RRR, S1/S2 +, no murmurs, no gallops, no carotid bruit.  Pulmonary: Effort and breath sounds normal, no stridor, rhonchi, wheezes, rales.  Abdominal: Soft. BS +, no distension, tenderness, rebound or guarding.  Musculoskeletal: Normal range of motion. No edema and no tenderness.  Lymphadenopathy: No lymphadenopathy noted, cervical, inguinal or axillary Neuro: Alert. Normal reflexes, muscle tone coordination. No cranial nerve deficit. Skin: Skin is warm and dry. No rash noted. Not diaphoretic. No erythema. No pallor. Psychiatric: Normal mood and affect. Behavior, judgment, thought content normal.  Lab Results  Component Value Date   WBC 5.3 01/17/2014   HGB 13.5 01/17/2014   HCT 39.5 01/17/2014   MCV 91.6 01/17/2014   PLT 203 01/17/2014   Lab Results  Component Value Date   CREATININE 1.70* 01/17/2014   BUN 18 01/17/2014   NA 138 01/17/2014   K 3.7 01/17/2014   CL 100 01/17/2014   CO2 24 01/17/2014    Lab Results  Component Value Date   HGBA1C 6.1* 01/23/2014   Lipid Panel     Component Value Date/Time   CHOL 244* 01/17/2014 1015   TRIG 207* 01/17/2014 1015   HDL 53 01/17/2014 1015   CHOLHDL 4.6 01/17/2014 1015   VLDL 41* 01/17/2014 1015   LDLCALC 150* 01/17/2014 1015       Assessment and plan:   1. Diabetes mellitus type 2, controlled, with complications - Pt has well controlled BS with diet. However I will add Lisinopril for it's dual purpose of reno-protection  and BP control. - lisinopril (PRINIVIL,ZESTRIL) 10 MG tablet; Take 1 tablet (10 mg total) by mouth daily.  Dispense: 90 tablet; Refill: 3 - Ambulatory referral to Opthalmology  2. Hyperlipidemia -Pt has not yet obtained the Crestor. This is tier 2 on his formulary and I have offered Simvastatin 40 mg Q SH. However patient wants to wait as he is currently working with a Tree surgeon in [place]. If he is denied then he will call to let us know and it will be changed to Simvastatin.  3. Essential hypertension - Uncontrolled. Add lisnopril. Re-check BP in 2 weeks. - lisinopril (PRINIVIL,ZESTRIL) 10 MG tablet; Take 1 tablet (10 mg total) by mouth daily.  Dispense: 90 tablet; Refill: 3  4. CKD (chronic kidney disease), stage 3 (moderate) - Pt has a GFR of 52  and has consistently had elevated Cr and decreased GFR. Will start on Lisinnopril for reno-protection.  - Urinalysis - lisinopril (PRINIVIL,ZESTRIL) 10 MG tablet; Take 1 tablet (10 mg total) by mouth daily.  Dispense: 90 tablet; Refill: 3  5. Immunizations - pt reports that he has had Pneumovax in last 5 years at [facility].   Return in about 1 month (around 03/30/2014) for HTN.  The patient was given clear instructions to go to ER or return to medical center if symptoms don't improve, worsen or new problems develop. The patient verbalized understanding. The patient was told to call to get lab results if they haven't heard anything in the next week.     This note has been created with Surveyor, quantity. Any transcriptional errors are unintentional.    Bradshaw Minihan A., MD [place], [place]   02/27/2014, 9:35 AM

## 2014-02-28 ENCOUNTER — Encounter: Payer: Self-pay | Admitting: Internal Medicine

## 2014-02-28 DIAGNOSIS — G589 Mononeuropathy, unspecified: Secondary | ICD-10-CM | POA: Diagnosis not present

## 2014-02-28 DIAGNOSIS — L97509 Non-pressure chronic ulcer of other part of unspecified foot with unspecified severity: Secondary | ICD-10-CM | POA: Diagnosis not present

## 2014-02-28 DIAGNOSIS — E1169 Type 2 diabetes mellitus with other specified complication: Secondary | ICD-10-CM | POA: Diagnosis not present

## 2014-02-28 LAB — URINALYSIS
Bilirubin Urine: NEGATIVE
Glucose, UA: 250 mg/dL — AB
Hgb urine dipstick: NEGATIVE
KETONES UR: NEGATIVE mg/dL
Leukocytes, UA: NEGATIVE
NITRITE: NEGATIVE
PROTEIN: 30 mg/dL — AB
Specific Gravity, Urine: 1.019 (ref 1.005–1.030)
UROBILINOGEN UA: 0.2 mg/dL (ref 0.0–1.0)
pH: 5.5 (ref 5.0–8.0)

## 2014-03-27 ENCOUNTER — Encounter: Payer: Self-pay | Admitting: Internal Medicine

## 2014-03-31 ENCOUNTER — Telehealth: Payer: Self-pay

## 2014-03-31 ENCOUNTER — Ambulatory Visit: Payer: Medicare HMO

## 2014-03-31 ENCOUNTER — Ambulatory Visit (INDEPENDENT_AMBULATORY_CARE_PROVIDER_SITE_OTHER): Payer: Medicare HMO | Admitting: Emergency Medicine

## 2014-03-31 VITALS — BP 130/100 | HR 54

## 2014-03-31 VITALS — BP 120/80 | HR 62 | Temp 98.7°F | Resp 16 | Ht 72.0 in | Wt 241.0 lb

## 2014-03-31 DIAGNOSIS — I10 Essential (primary) hypertension: Secondary | ICD-10-CM

## 2014-03-31 DIAGNOSIS — Z0000 Encounter for general adult medical examination without abnormal findings: Secondary | ICD-10-CM

## 2014-03-31 NOTE — Progress Notes (Signed)
[facility] [address] [place] [address] [phone]  Date:  03/31/2014   Name:  Cory Marks   DOB:  25-Jan-1961   MRN:  [id number]  PCP:  Lovina Reach, MD    Chief Complaint: Employment Physical   History of Present Illness:  Cory Marks is a 53 y.o. very pleasant male patient who presents with the following:  Wellness examination Hypertension   Patient Active Problem List   Diagnosis Date Noted  . CKD (chronic kidney disease) 02/27/2014  . Hyperlipidemia 01/23/2014  . Diabetes mellitus type 2, controlled, with complications 79/09/4095  . Foot ulcer, right 01/23/2014  . Right foot drop 01/23/2014  . CAD in native artery 01/23/2014  . Metabolic syndrome 35/32/9924  . Encounter for screening colonoscopy 12/26/2013  . History of bradycardia 12/26/2013  . HTN (hypertension) 09/12/2012  . Lymphoma 09/12/2012    Past Medical History  Diagnosis Date  . Hypertension   . Diabetes mellitus   . Bell's palsy   . Cancer     Mantle Cell Cancer- S/P stem cell transplant    Past Surgical History  Procedure Laterality Date  . Splenectomy, total    . Edentulation N/A y-2    prior to transplant    History  Substance Use Topics  . Smoking status: Never Smoker   . Smokeless tobacco: Not on file  . Alcohol Use: 0.6 oz/week    1 Cans of beer per week     Comment: occ    Family History  Problem Relation Age of Onset  . Hypertension Mother   . Diabetes Mother   . Cancer Father   . Diabetes Father   . Heart disease Father   . Diabetes Sister     Allergies  Allergen Reactions  . Oxycodone Itching and Palpitations    Medication list has been reviewed and updated.  Current Outpatient Prescriptions on File Prior to Visit  Medication Sig Dispense Refill  . amLODipine (NORVASC) 10 MG tablet Take 10 mg by mouth every morning.      . docusate sodium (COLACE) 100 MG capsule Take 100 mg by mouth daily as needed for constipation.       . gabapentin (NEURONTIN) 300 MG capsule Take 300 mg by mouth 3 (three) times daily.      Marland Kitchen lisinopril (PRINIVIL,ZESTRIL) 10 MG tablet Take 1 tablet (10 mg total) by mouth daily.  90 tablet  3  . penicillin v potassium (VEETID) 500 MG tablet Take 500 mg by mouth 2 (two) times daily.      Marland Kitchen pyridOXINE (VITAMIN B-6) 100 MG tablet Take 100 mg by mouth every morning.       . rosuvastatin (CRESTOR) 20 MG tablet Take 1 tablet (20 mg total) by mouth daily.  30 tablet  2  . acyclovir (ZOVIRAX) 800 MG tablet Take 800 mg by mouth 2 (two) times daily.      . methadone (DOLOPHINE) 10 MG tablet Take 10 mg by mouth 2 (two) times daily.       No current facility-administered medications on file prior to visit.    Review of Systems:  As per HPI, otherwise negative.    Physical Examination: Filed Vitals:   03/31/14 1917  BP: 120/80  Pulse: 62  Temp: 98.7 F (37.1 C)  Resp: 16   Filed Vitals:   03/31/14 1917  Height: 6' (1.829 m)  Weight: 241 lb (109.317 kg)   Body mass index is 32.68 kg/(m^2). Ideal  Body Weight: Weight in (lb) to have BMI = 25: 183.9  GEN: WDWN, NAD, Non-toxic, A & O x 3 HEENT: Atraumatic, Normocephalic. Neck supple. No masses, No LAD. Ears and Nose: No external deformity. CV: RRR, No M/G/R. No JVD. No thrill. No extra heart sounds. PULM: CTA B, no wheezes, crackles, rhonchi. No retractions. No resp. distress. No accessory muscle use. ABD: S, NT, ND, +BS. No rebound. No HSM. EXTR: No c/c/e NEURO Normal gait.  PSYCH: Normally interactive. Conversant. Not depressed or anxious appearing.  Calm demeanor.    Assessment and Plan: Wellness exam   Signed,  Ellison Carwin, MD

## 2014-03-31 NOTE — Telephone Encounter (Signed)
Pt states he was trying to get his dot card and wasn't able to due to the medication he was put on by one of the dr's here. Would like to speak with someone Please call [phone]

## 2014-03-31 NOTE — Patient Instructions (Signed)

## 2014-03-31 NOTE — Progress Notes (Signed)
Patient ID: Cory Marks, male   DOB: Feb 01, 1961, 53 y.o.   MRN: [id number]  Patient came in for bp check today. Bp was elevated at 130/100. Patient states he has only been taking his norvasc, last dose was 03/31/2014 @7 :30am. Patient had NOT been taking Lisinopril. Spoke with Dr. Zigmund Daniel regarding this issue, she advised that he needed to be taking both medications. I advised patient that he needs to take both medications. Patient had to leave, I advised him to take his lisinopril today and everyday along with his norvasc, patient stated understanding and agreed. I also advised him if he had any headache, dizziness, or sob to call 911 or go to nearest emergency room. Patient agreed.

## 2014-03-31 NOTE — Addendum Note (Signed)
Addended by: Roselee Culver on: 03/31/2014 08:31 PM   Modules accepted: Level of Service

## 2014-03-31 NOTE — Telephone Encounter (Signed)
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 He wanted a card written because he had a PE last year. i explained to him there are guidelines that must be followed in accordance with DOT licensing. He stated he already pain $70 last year, and i explained to him he would need a new PE if he wished to have a current DOT card.  He understood and asked how long the card would be valid, i told him the longest he could qualify for is 2 years, but that was only if he qualifies.  Pt expressed an understanding.

## 2014-04-01 ENCOUNTER — Telehealth: Payer: Self-pay

## 2014-04-01 NOTE — Telephone Encounter (Signed)
Pt states he came in last night for his DOT PE and was given a 1 year card, would like to know why he wasn't given a 2 year card instead Please call [phone]

## 2014-04-01 NOTE — Telephone Encounter (Signed)
Advised pt due to htn pt only able to have a 1 year card. He understands he will need to RTC for another DOT PE in one year.

## 2014-04-09 ENCOUNTER — Other Ambulatory Visit: Payer: Self-pay

## 2014-04-09 MED ORDER — GABAPENTIN 300 MG PO CAPS: 300.0000 mg | ORAL_CAPSULE | Freq: Three times a day (TID) | ORAL | Status: DC

## 2014-04-09 NOTE — Telephone Encounter (Signed)
Refilled gabapentin 300mg  to [facility] via e-script.

## 2014-05-05 ENCOUNTER — Other Ambulatory Visit: Payer: Self-pay

## 2014-05-05 DIAGNOSIS — I10 Essential (primary) hypertension: Secondary | ICD-10-CM

## 2014-05-05 DIAGNOSIS — E118 Type 2 diabetes mellitus with unspecified complications: Secondary | ICD-10-CM

## 2014-05-05 DIAGNOSIS — N183 Chronic kidney disease, stage 3 (moderate): Secondary | ICD-10-CM

## 2014-05-05 MED ORDER — LISINOPRIL 10 MG PO TABS: 10.0000 mg | ORAL_TABLET | Freq: Every day | ORAL | Status: DC

## 2014-05-05 MED ORDER — GABAPENTIN 300 MG PO CAPS: 300.0000 mg | ORAL_CAPSULE | Freq: Three times a day (TID) | ORAL | Status: DC

## 2014-05-05 NOTE — Telephone Encounter (Signed)
REFILLED GABAPENTIN

## 2014-05-07 ENCOUNTER — Telehealth: Payer: Self-pay

## 2014-05-07 DIAGNOSIS — N183 Chronic kidney disease, stage 3 (moderate): Secondary | ICD-10-CM

## 2014-05-07 DIAGNOSIS — I10 Essential (primary) hypertension: Secondary | ICD-10-CM

## 2014-05-07 DIAGNOSIS — E118 Type 2 diabetes mellitus with unspecified complications: Secondary | ICD-10-CM

## 2014-05-07 MED ORDER — LISINOPRIL 10 MG PO TABS: 10.0000 mg | ORAL_TABLET | Freq: Every day | ORAL | Status: DC

## 2014-05-07 NOTE — Telephone Encounter (Signed)
Fax request for refill on lisinopril has been refilled and sent in via e-script.

## 2014-05-30 ENCOUNTER — Encounter: Payer: Self-pay | Admitting: Family Medicine

## 2014-05-30 ENCOUNTER — Ambulatory Visit (INDEPENDENT_AMBULATORY_CARE_PROVIDER_SITE_OTHER): Payer: Medicare HMO | Admitting: Family Medicine

## 2014-05-30 VITALS — BP 129/85 | HR 58 | Temp 98.5°F | Resp 16 | Ht 72.0 in | Wt 242.0 lb

## 2014-05-30 DIAGNOSIS — Z8579 Personal history of other malignant neoplasms of lymphoid, hematopoietic and related tissues: Secondary | ICD-10-CM

## 2014-05-30 DIAGNOSIS — L97519 Non-pressure chronic ulcer of other part of right foot with unspecified severity: Secondary | ICD-10-CM

## 2014-05-30 DIAGNOSIS — E8881 Metabolic syndrome: Secondary | ICD-10-CM

## 2014-05-30 DIAGNOSIS — E118 Type 2 diabetes mellitus with unspecified complications: Secondary | ICD-10-CM

## 2014-05-30 DIAGNOSIS — E785 Hyperlipidemia, unspecified: Secondary | ICD-10-CM

## 2014-05-30 DIAGNOSIS — I10 Essential (primary) hypertension: Secondary | ICD-10-CM

## 2014-05-30 DIAGNOSIS — Z23 Encounter for immunization: Secondary | ICD-10-CM

## 2014-05-30 LAB — COMPLETE METABOLIC PANEL WITH GFR
ALT: 26 U/L (ref 0–53)
AST: 30 U/L (ref 0–37)
Albumin: 4.4 g/dL (ref 3.5–5.2)
Alkaline Phosphatase: 35 U/L — ABNORMAL LOW (ref 39–117)
BILIRUBIN TOTAL: 0.8 mg/dL (ref 0.2–1.2)
BUN: 22 mg/dL (ref 6–23)
CO2: 23 meq/L (ref 19–32)
CREATININE: 1.81 mg/dL — AB (ref 0.50–1.35)
Calcium: 9.5 mg/dL (ref 8.4–10.5)
Chloride: 105 mEq/L (ref 96–112)
GFR, EST AFRICAN AMERICAN: 48 mL/min — AB
GFR, EST NON AFRICAN AMERICAN: 41 mL/min — AB
Glucose, Bld: 104 mg/dL — ABNORMAL HIGH (ref 70–99)
Potassium: 4.2 mEq/L (ref 3.5–5.3)
Sodium: 142 mEq/L (ref 135–145)
Total Protein: 5.9 g/dL — ABNORMAL LOW (ref 6.0–8.3)

## 2014-05-30 LAB — TSH: TSH: 0.705 u[IU]/mL (ref 0.350–4.500)

## 2014-05-30 LAB — HEMOGLOBIN A1C
HEMOGLOBIN A1C: 6.1 % — AB (ref <5.7)
MEAN PLASMA GLUCOSE: 128 mg/dL — AB (ref <117)

## 2014-05-30 NOTE — Patient Instructions (Signed)
DASH Eating Plan DASH stands for "Dietary Approaches to Stop Hypertension." The DASH eating plan is a healthy eating plan that has been shown to reduce high blood pressure (hypertension). Additional health benefits may include reducing the risk of type 2 diabetes mellitus, heart disease, and stroke. The DASH eating plan may also help with weight loss. WHAT DO I NEED TO KNOW ABOUT THE DASH EATING PLAN? For the DASH eating plan, you will follow these general guidelines:  Choose foods with a percent daily value for sodium of less than 5% (as listed on the food label).  Use salt-free seasonings or herbs instead of table salt or sea salt.  Check with your health care provider or pharmacist before using salt substitutes.  Eat lower-sodium products, often labeled as "lower sodium" or "no salt added."  Eat fresh foods.  Eat more vegetables, fruits, and low-fat dairy products.  Choose whole grains. Look for the word "whole" as the first word in the ingredient list.  Choose fish and skinless chicken or turkey more often than red meat. Limit fish, poultry, and meat to 6 oz (170 g) each day.  Limit sweets, desserts, sugars, and sugary drinks.  Choose heart-healthy fats.  Limit cheese to 1 oz (28 g) per day.  Eat more home-cooked food and less restaurant, buffet, and fast food.  Limit fried foods.  Cook foods using methods other than frying.  Limit canned vegetables. If you do use them, rinse them well to decrease the sodium.  When eating at a restaurant, ask that your food be prepared with less salt, or no salt if possible. WHAT FOODS CAN I EAT? Seek help from a dietitian for individual calorie needs. Grains Whole grain or whole wheat bread. Brown rice. Whole grain or whole wheat pasta. Quinoa, bulgur, and whole grain cereals. Low-sodium cereals. Corn or whole wheat flour tortillas. Whole grain cornbread. Whole grain crackers. Low-sodium crackers. Vegetables Fresh or frozen vegetables  (raw, steamed, roasted, or grilled). Low-sodium or reduced-sodium tomato and vegetable juices. Low-sodium or reduced-sodium tomato sauce and paste. Low-sodium or reduced-sodium canned vegetables.  Fruits All fresh, canned (in natural juice), or frozen fruits. Meat and Other Protein Products Ground beef (85% or leaner), grass-fed beef, or beef trimmed of fat. Skinless chicken or turkey. Ground chicken or turkey. Pork trimmed of fat. All fish and seafood. Eggs. Dried beans, peas, or lentils. Unsalted nuts and seeds. Unsalted canned beans. Dairy Low-fat dairy products, such as skim or 1% milk, 2% or reduced-fat cheeses, low-fat ricotta or cottage cheese, or plain low-fat yogurt. Low-sodium or reduced-sodium cheeses. Fats and Oils Tub margarines without trans fats. Light or reduced-fat mayonnaise and salad dressings (reduced sodium). Avocado. Safflower, olive, or canola oils. Natural peanut or almond butter. Other Unsalted popcorn and pretzels. The items listed above may not be a complete list of recommended foods or beverages. Contact your dietitian for more options. WHAT FOODS ARE NOT RECOMMENDED? Grains White bread. White pasta. White rice. Refined cornbread. Bagels and croissants. Crackers that contain trans fat. Vegetables Creamed or fried vegetables. Vegetables in a cheese sauce. Regular canned vegetables. Regular canned tomato sauce and paste. Regular tomato and vegetable juices. Fruits Dried fruits. Canned fruit in light or heavy syrup. Fruit juice. Meat and Other Protein Products Fatty cuts of meat. Ribs, chicken wings, bacon, sausage, bologna, salami, chitterlings, fatback, hot dogs, bratwurst, and packaged luncheon meats. Salted nuts and seeds. Canned beans with salt. Dairy Whole or 2% milk, cream, half-and-half, and cream cheese. Whole-fat or sweetened yogurt. Full-fat   cheeses or blue cheese. Nondairy creamers and whipped toppings. Processed cheese, cheese spreads, or cheese  curds. Condiments Onion and garlic salt, seasoned salt, table salt, and sea salt. Canned and packaged gravies. Worcestershire sauce. Tartar sauce. Barbecue sauce. Teriyaki sauce. Soy sauce, including reduced sodium. Steak sauce. Fish sauce. Oyster sauce. Cocktail sauce. Horseradish. Ketchup and mustard. Meat flavorings and tenderizers. Bouillon cubes. Hot sauce. Tabasco sauce. Marinades. Taco seasonings. Relishes. Fats and Oils Butter, stick margarine, lard, shortening, ghee, and bacon fat. Coconut, palm kernel, or palm oils. Regular salad dressings. Other Pickles and olives. Salted popcorn and pretzels. The items listed above may not be a complete list of foods and beverages to avoid. Contact your dietitian for more information. WHERE CAN I FIND MORE INFORMATION? National Heart, Lung, and Blood Institute: www.nhlbi.nih.gov/health/health-topics/topics/dash/ Document Released: 07/14/2011 Document Revised: 12/09/2013 Document Reviewed: 05/29/2013 ExitCare Patient Information 2015 [employer]. This information is not intended to replace advice given to you by your health care provider. Make sure you discuss any questions you have with your health care provider. Hypertension Hypertension, commonly called high blood pressure, is when the force of blood pumping through your arteries is too strong. Your arteries are the blood vessels that carry blood from your heart throughout your body. A blood pressure reading consists of a higher number over a lower number, such as 110/72. The higher number (systolic) is the pressure inside your arteries when your heart pumps. The lower number (diastolic) is the pressure inside your arteries when your heart relaxes. Ideally you want your blood pressure below 120/80. Hypertension forces your heart to work harder to pump blood. Your arteries may become narrow or stiff. Having hypertension puts you at risk for heart disease, stroke, and other problems.  RISK  FACTORS Some risk factors for high blood pressure are controllable. Others are not.  Risk factors you cannot control include:   Race. You may be at higher risk if you are African American.  Age. Risk increases with age.  Gender. Men are at higher risk than women before age 45 years. After age 65, women are at higher risk than men. Risk factors you can control include:  Not getting enough exercise or physical activity.  Being overweight.  Getting too much fat, sugar, calories, or salt in your diet.  Drinking too much alcohol. SIGNS AND SYMPTOMS Hypertension does not usually cause signs or symptoms. Extremely high blood pressure (hypertensive crisis) may cause headache, anxiety, shortness of breath, and nosebleed. DIAGNOSIS  To check if you have hypertension, your health care provider will measure your blood pressure while you are seated, with your arm held at the level of your heart. It should be measured at least twice using the same arm. Certain conditions can cause a difference in blood pressure between your right and left arms. A blood pressure reading that is higher than normal on one occasion does not mean that you need treatment. If one blood pressure reading is high, ask your health care provider about having it checked again. TREATMENT  Treating high blood pressure includes making lifestyle changes and possibly taking medicine. Living a healthy lifestyle can help lower high blood pressure. You may need to change some of your habits. Lifestyle changes may include:  Following the DASH diet. This diet is high in fruits, vegetables, and whole grains. It is low in salt, red meat, and added sugars.  Getting at least 2 hours of brisk physical activity every week.  Losing weight if necessary.  Not smoking.  Limiting   alcoholic beverages.  Learning ways to reduce stress. If lifestyle changes are not enough to get your blood pressure under control, your health care provider may  prescribe medicine. You may need to take more than one. Work closely with your health care provider to understand the risks and benefits. HOME CARE INSTRUCTIONS  Have your blood pressure rechecked as directed by your health care provider.   Take medicines only as directed by your health care provider. Follow the directions carefully. Blood pressure medicines must be taken as prescribed. The medicine does not work as well when you skip doses. Skipping doses also puts you at risk for problems.   Do not smoke.   Monitor your blood pressure at home as directed by your health care provider. SEEK MEDICAL CARE IF:   You think you are having a reaction to medicines taken.  You have recurrent headaches or feel dizzy.  You have swelling in your ankles.  You have trouble with your vision. SEEK IMMEDIATE MEDICAL CARE IF:  You develop a severe headache or confusion.  You have unusual weakness, numbness, or feel faint.  You have severe chest or abdominal pain.  You vomit repeatedly.  You have trouble breathing. MAKE SURE YOU:   Understand these instructions.  Will watch your condition.  Will get help right away if you are not doing well or get worse. Document Released: 07/25/2005 Document Revised: 12/09/2013 Document Reviewed: 05/17/2013 ExitCare Patient Information 2015 [employer]. This information is not intended to replace advice given to you by your health care provider. Make sure you discuss any questions you have with your health care provider.  

## 2014-05-30 NOTE — Progress Notes (Signed)
   Subjective:    Patient ID: Cory Marks, male    DOB: 1960/09/04, 53 y.o.   MRN: [id number]  HPI  Patient presents for a 3 month follow up of chronic conditions.    Patient here for follow-up of elevated blood pressure. He is not exercising and is not adherent to low salt diet.  Blood pressure is well controlled at home.. Patient denies chest pain, dyspnea, fatigue, irregular heart beat, near-syncope, orthopnea, palpitations and syncope. He does not have a hx of target organ damage.   Patient also here for follow up of diabetes.  Patient has a history of of a healing right foot ulcer.  Symptoms have been well-controlled and since receiving treatment at the wound care center. . Patient denies hyperglycemia, nausea, polydipsia, polyuria, visual disturbances, vomitting and weight loss.  Evaluation to date has been included: hemoglobin A1C.      Past Medical History  Diagnosis Date  . Hypertension   . Diabetes mellitus   . Bell's palsy   . Cancer     Mantle Cell Cancer- S/P stem cell transplant   Review of Systems  Constitutional: Negative.   HENT: Negative.   Eyes: Negative.        Ptosis, history of bell's palsy with ptosis  Respiratory: Negative.   Cardiovascular: Negative.   Gastrointestinal: Negative.   Endocrine: Negative.   Genitourinary: Negative.   Skin: Negative.   Allergic/Immunologic: Negative.   Neurological: Positive for numbness (To lower extremities).  Hematological: Negative.   Psychiatric/Behavioral: Negative.        Objective:   Physical Exam  Constitutional: He appears well-developed and well-nourished.  HENT:  Head: Normocephalic and atraumatic.  Right Ear: External ear normal.  Left Ear: External ear normal.  Mouth/Throat: Oropharynx is clear and moist.  Eyes: Conjunctivae and EOM are normal. Pupils are equal, round, and reactive to light. Lids are everted and swept, no foreign bodies found.  Neck: Trachea normal and normal range of motion. Neck  supple.  Cardiovascular: Normal rate, regular rhythm and normal pulses.   Pulmonary/Chest: Breath sounds normal.  Abdominal: Soft. Normal appearance. There is no tenderness.  Neurological: He is alert. No cranial nerve deficit or sensory deficit.  Skin: Skin is warm, dry and intact. Clubbing: .  Healing ulcer to right great toe.   Psychiatric: He has a normal mood and affect. His speech is normal and behavior is normal. Thought content normal.         BP 129/85  Pulse 58  Temp(Src) 98.5 F (36.9 C) (Oral)  Resp 16  Ht 6' (1.829 m)  Wt 242 lb (109.77 kg)  BMI 32.81 kg/m2 Assessment & Plan:  1. Essential hypertension Blood pressure stable on current medication regimen.  - Urinalysis, Complete  2.Diabetes mellitus type 2, controlled, with complications Last hemoglobin A1C - Urinalysis, Complete - COMPLETE METABOLIC PANEL WITH GFR - Hemoglobin A1c  3. Hyperlipidemia Will re-check lipid panel at 3 months follow up. Recommend continuation of current medication regimen.   4. Foot ulcer, right Patient completed regimen  5. Metabolic syndrome Patient has gained 17 pounds since May. He states that he has not changed eating habits. However, he is not exercising,  - TSH  6. Need for immunization against influenza  - Flu Vaccine QUAD 36+ mos IM (Fluarix)  7. History of lymphoma Patient follows up with oncologist every 3 months. Dr. Claris Che at [facility], FNP

## 2014-05-31 LAB — URINALYSIS, COMPLETE
BACTERIA UA: NONE SEEN
Bilirubin Urine: NEGATIVE
CASTS: NONE SEEN
CRYSTALS: NONE SEEN
Glucose, UA: NEGATIVE mg/dL
Hgb urine dipstick: NEGATIVE
KETONES UR: NEGATIVE mg/dL
Leukocytes, UA: NEGATIVE
NITRITE: NEGATIVE
PH: 6 (ref 5.0–8.0)
Protein, ur: NEGATIVE mg/dL
SQUAMOUS EPITHELIAL / LPF: NONE SEEN
Specific Gravity, Urine: 1.029 (ref 1.005–1.030)
Urobilinogen, UA: 1 mg/dL (ref 0.0–1.0)

## 2014-07-20 ENCOUNTER — Emergency Department (HOSPITAL_COMMUNITY): Payer: Medicare HMO

## 2014-07-20 ENCOUNTER — Inpatient Hospital Stay (HOSPITAL_COMMUNITY)
Admission: EM | Admit: 2014-07-20 | Discharge: 2014-07-23 | DRG: 917 | Disposition: A | Discharge status: Home / Self Care | Payer: Medicare HMO | Attending: Pulmonary Disease | Admitting: Pulmonary Disease

## 2014-07-20 DIAGNOSIS — R400 Somnolence: Secondary | ICD-10-CM

## 2014-07-20 DIAGNOSIS — R4182 Altered mental status, unspecified: Secondary | ICD-10-CM | POA: Diagnosis not present

## 2014-07-20 DIAGNOSIS — Z9081 Acquired absence of spleen: Secondary | ICD-10-CM | POA: Diagnosis present

## 2014-07-20 DIAGNOSIS — E8881 Metabolic syndrome: Secondary | ICD-10-CM

## 2014-07-20 DIAGNOSIS — J9692 Respiratory failure, unspecified with hypercapnia: Secondary | ICD-10-CM | POA: Diagnosis present

## 2014-07-20 DIAGNOSIS — T40601A Poisoning by unspecified narcotics, accidental (unintentional), initial encounter: Principal | ICD-10-CM | POA: Diagnosis present

## 2014-07-20 DIAGNOSIS — I10 Essential (primary) hypertension: Secondary | ICD-10-CM | POA: Diagnosis present

## 2014-07-20 DIAGNOSIS — R0689 Other abnormalities of breathing: Secondary | ICD-10-CM

## 2014-07-20 DIAGNOSIS — E11621 Type 2 diabetes mellitus with foot ulcer: Secondary | ICD-10-CM | POA: Diagnosis present

## 2014-07-20 DIAGNOSIS — E785 Hyperlipidemia, unspecified: Secondary | ICD-10-CM

## 2014-07-20 DIAGNOSIS — M79671 Pain in right foot: Secondary | ICD-10-CM

## 2014-07-20 DIAGNOSIS — L84 Corns and callosities: Secondary | ICD-10-CM | POA: Diagnosis present

## 2014-07-20 DIAGNOSIS — F1910 Other psychoactive substance abuse, uncomplicated: Secondary | ICD-10-CM | POA: Diagnosis present

## 2014-07-20 DIAGNOSIS — G9340 Encephalopathy, unspecified: Secondary | ICD-10-CM | POA: Diagnosis present

## 2014-07-20 DIAGNOSIS — G510 Bell's palsy: Secondary | ICD-10-CM | POA: Diagnosis present

## 2014-07-20 DIAGNOSIS — Z87898 Personal history of other specified conditions: Secondary | ICD-10-CM

## 2014-07-20 DIAGNOSIS — F1410 Cocaine abuse, uncomplicated: Secondary | ICD-10-CM | POA: Diagnosis present

## 2014-07-20 DIAGNOSIS — T402X4A Poisoning by other opioids, undetermined, initial encounter: Secondary | ICD-10-CM

## 2014-07-20 DIAGNOSIS — Z886 Allergy status to analgesic agent status: Secondary | ICD-10-CM

## 2014-07-20 DIAGNOSIS — N184 Chronic kidney disease, stage 4 (severe): Secondary | ICD-10-CM | POA: Diagnosis present

## 2014-07-20 DIAGNOSIS — M21371 Foot drop, right foot: Secondary | ICD-10-CM

## 2014-07-20 DIAGNOSIS — E1165 Type 2 diabetes mellitus with hyperglycemia: Secondary | ICD-10-CM | POA: Diagnosis present

## 2014-07-20 DIAGNOSIS — F1290 Cannabis use, unspecified, uncomplicated: Secondary | ICD-10-CM | POA: Diagnosis present

## 2014-07-20 DIAGNOSIS — I2510 Atherosclerotic heart disease of native coronary artery without angina pectoris: Secondary | ICD-10-CM

## 2014-07-20 DIAGNOSIS — Z1211 Encounter for screening for malignant neoplasm of colon: Secondary | ICD-10-CM

## 2014-07-20 DIAGNOSIS — E118 Type 2 diabetes mellitus with unspecified complications: Secondary | ICD-10-CM

## 2014-07-20 DIAGNOSIS — R509 Fever, unspecified: Secondary | ICD-10-CM

## 2014-07-20 DIAGNOSIS — I129 Hypertensive chronic kidney disease with stage 1 through stage 4 chronic kidney disease, or unspecified chronic kidney disease: Secondary | ICD-10-CM | POA: Diagnosis present

## 2014-07-20 DIAGNOSIS — Z8679 Personal history of other diseases of the circulatory system: Secondary | ICD-10-CM

## 2014-07-20 DIAGNOSIS — C8590 Non-Hodgkin lymphoma, unspecified, unspecified site: Secondary | ICD-10-CM

## 2014-07-20 DIAGNOSIS — Z8572 Personal history of non-Hodgkin lymphomas: Secondary | ICD-10-CM

## 2014-07-20 DIAGNOSIS — Z8579 Personal history of other malignant neoplasms of lymphoid, hematopoietic and related tissues: Secondary | ICD-10-CM

## 2014-07-20 DIAGNOSIS — R0602 Shortness of breath: Secondary | ICD-10-CM

## 2014-07-20 DIAGNOSIS — L97519 Non-pressure chronic ulcer of other part of right foot with unspecified severity: Secondary | ICD-10-CM

## 2014-07-20 HISTORY — DX: Acquired absence of spleen: Z90.81

## 2014-07-20 HISTORY — DX: Other psychoactive substance abuse, uncomplicated: F19.10

## 2014-07-20 LAB — CBC
HCT: 43.3 % (ref 39.0–52.0)
Hemoglobin: 14.2 g/dL (ref 13.0–17.0)
MCH: 32.3 pg (ref 26.0–34.0)
MCHC: 32.8 g/dL (ref 30.0–36.0)
MCV: 98.6 fL (ref 78.0–100.0)
PLATELETS: 171 10*3/uL (ref 150–400)
RBC: 4.39 MIL/uL (ref 4.22–5.81)
RDW: 14.2 % (ref 11.5–15.5)
WBC: 7.1 10*3/uL (ref 4.0–10.5)

## 2014-07-20 LAB — COMPREHENSIVE METABOLIC PANEL
ALBUMIN: 4.3 g/dL (ref 3.5–5.2)
ALT: 23 U/L (ref 0–53)
AST: 33 U/L (ref 0–37)
Alkaline Phosphatase: 38 U/L — ABNORMAL LOW (ref 39–117)
Anion gap: 16 — ABNORMAL HIGH (ref 5–15)
BUN: 30 mg/dL — ABNORMAL HIGH (ref 6–23)
CALCIUM: 10.1 mg/dL (ref 8.4–10.5)
CO2: 25 mEq/L (ref 19–32)
Chloride: 95 mEq/L — ABNORMAL LOW (ref 96–112)
Creatinine, Ser: 2.82 mg/dL — ABNORMAL HIGH (ref 0.50–1.35)
GFR calc Af Amer: 28 mL/min — ABNORMAL LOW (ref >90)
GFR calc non Af Amer: 24 mL/min — ABNORMAL LOW (ref >90)
Glucose, Bld: 128 mg/dL — ABNORMAL HIGH (ref 70–99)
POTASSIUM: 4.4 meq/L (ref 3.7–5.3)
SODIUM: 136 meq/L — AB (ref 137–147)
Total Bilirubin: 0.6 mg/dL (ref 0.3–1.2)
Total Protein: 7 g/dL (ref 6.0–8.3)

## 2014-07-20 LAB — I-STAT CG4 LACTIC ACID, ED: Lactic Acid, Venous: 1.93 mmol/L (ref 0.5–2.2)

## 2014-07-20 MED ORDER — PIPERACILLIN-TAZOBACTAM 3.375 G IVPB 30 MIN
3.3750 g | Freq: Once | INTRAVENOUS | Status: AC
  Administered 2014-07-21: 3.375 g via INTRAVENOUS
  Filled 2014-07-20: qty 50

## 2014-07-20 MED ORDER — VANCOMYCIN HCL IN DEXTROSE 1-5 GM/200ML-% IV SOLN
1000.0000 mg | Freq: Once | INTRAVENOUS | Status: AC
  Administered 2014-07-21: 1000 mg via INTRAVENOUS
  Filled 2014-07-20: qty 200

## 2014-07-20 MED ORDER — ACYCLOVIR SODIUM 50 MG/ML IV SOLN
10.0000 mg/kg | Freq: Once | INTRAVENOUS | Status: AC
  Administered 2014-07-21: 775 mg via INTRAVENOUS
  Filled 2014-07-20: qty 15.5

## 2014-07-20 MED ORDER — ACETAMINOPHEN 650 MG RE SUPP
650.0000 mg | Freq: Once | RECTAL | Status: AC
  Administered 2014-07-20: 650 mg via RECTAL
  Filled 2014-07-20: qty 1

## 2014-07-20 NOTE — ED Notes (Signed)
Dr Yelverton at bedside.  

## 2014-07-20 NOTE — ED Notes (Signed)
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 Pt has hx Cancer (unknow type)

## 2014-07-20 NOTE — ED Notes (Signed)
CBG taken =133

## 2014-07-20 NOTE — ED Notes (Signed)
Patient returned from X-ray 

## 2014-07-20 NOTE — ED Provider Notes (Signed)
CSN: [id number]     Arrival date & time 07/20/14  2219 History   First MD Initiated Contact with Patient 07/20/14 2304     Chief Complaint  Patient presents with  . Altered Mental Status     (Consider location/radiation/quality/duration/timing/severity/associated sxs/prior Treatment) Patient is a 53 y.o. male presenting with altered mental status. The history is provided by the patient and the spouse. No language interpreter was used.  Altered Mental Status  This chart was scribed for Julianne Rice, MD by Thea Alken, ED Scribe. This patient was seen in room D33C/D33C and the patient's care was started at 1:50 AM.  HPI Comments:  Cory Marks is a 53 y.o. male who present to the Emergency Department for altered mental status that began today. Pt is here with his wife. She reports this afternoon pt began to feel sleepy and sluggish. Pt states he has right great toe pain that has been on going for a while and a non productive cough. Per wife pt was seen at wound center for right great toe pain. He denies abdominal pain. Denies chest pain. Denies drug use.  Past Medical History  Diagnosis Date  . Hypertension   . Diabetes mellitus   . Bell's palsy   . Cancer     Mantle Cell Cancer- S/P stem cell transplant   Past Surgical History  Procedure Laterality Date  . Splenectomy, total    . Edentulation N/A y-2    prior to transplant   Family History  Problem Relation Age of Onset  . Hypertension Mother   . Diabetes Mother   . Cancer Father   . Diabetes Father   . Heart disease Father   . Diabetes Sister    History  Substance Use Topics  . Smoking status: Never Smoker   . Smokeless tobacco: Not on file  . Alcohol Use: 0.6 oz/week    1 Cans of beer per week     Comment: occ    Review of Systems  Unable to perform ROS: Mental status change   Allergies  Oxycodone  Home Medications   Prior to Admission medications   Medication Sig Start Date End Date Taking?  Authorizing Provider  amLODipine (NORVASC) 10 MG tablet Take 10 mg by mouth every morning.   Yes Historical Provider, MD  cetirizine (ZYRTEC) 10 MG tablet Take 10 mg by mouth daily as needed for allergies.   Yes Historical Provider, MD  docusate sodium (COLACE) 100 MG capsule Take 100 mg by mouth daily as needed for constipation.   Yes Historical Provider, MD  gabapentin (NEURONTIN) 300 MG capsule Take 1 capsule (300 mg total) by mouth 3 (three) times daily. Patient taking differently: Take 600 mg by mouth 3 (three) times daily.  05/05/14  Yes Leana Gamer, MD  lisinopril (PRINIVIL,ZESTRIL) 10 MG tablet Take 1 tablet (10 mg total) by mouth daily. 05/07/14  Yes Dorena Dew, FNP  OVER THE COUNTER MEDICATION Take 1 capsule by mouth daily as needed (erectile dysfunction). X AGAIN   Yes Historical Provider, MD  penicillin v potassium (VEETID) 500 MG tablet Take 500 mg by mouth 2 (two) times daily.   Yes Historical Provider, MD  pyridOXINE (VITAMIN B-6) 100 MG tablet Take 100 mg by mouth every morning.    Yes Historical Provider, MD  rosuvastatin (CRESTOR) 20 MG tablet Take 1 tablet (20 mg total) by mouth daily. 01/31/14  Yes Leana Gamer, MD  voriconazole (VFEND) 200 MG tablet Take 200 mg by mouth  2 (two) times daily.   Yes Historical Provider, MD  acyclovir (ZOVIRAX) 800 MG tablet Take 800 mg by mouth 2 (two) times daily.    Historical Provider, MD   BP 126/91 mmHg  Pulse 44  Temp(Src) 99 F (37.2 C) (Oral)  Resp 12  Ht 6' (1.829 m)  Wt 240 lb (108.863 kg)  BMI 32.54 kg/m2  SpO2 99% Physical Exam  Constitutional: He appears well-developed and well-nourished. No distress.  Drowsy but arousable.  HENT:  Head: Normocephalic and atraumatic.  Mouth/Throat: Oropharynx is clear and moist.  Eyes: EOM are normal. Pupils are equal, round, and reactive to light.  Pupils are 2 mm bilaterally and minimally reactive.  Neck: Normal range of motion. Neck supple.  No meningismus.   Cardiovascular: Normal rate and regular rhythm.  Exam reveals no gallop and no friction rub.   No murmur heard. Pulmonary/Chest: Effort normal and breath sounds normal. No respiratory distress. He has no wheezes. He has no rales. He exhibits no tenderness.  Periodic apnea  Abdominal: Soft. Bowel sounds are normal. He exhibits no distension and no mass. There is no tenderness. There is no rebound and no guarding.  Musculoskeletal: Normal range of motion. He exhibits no edema or tenderness.  Right great with ulceration and eschar. There is mild swelling and warmth to the right foot up to the right calf.  Neurological:  Patient is lethargic but arousable. He is moving all extremities without any focal deficits. Sensation appears to be grossly intact.  Skin: Skin is warm and dry. No rash noted. No erythema.  Nursing note and vitals reviewed.   ED Course  Procedures (including critical care time) Labs Review Labs Reviewed  COMPREHENSIVE METABOLIC PANEL - Abnormal; Notable for the following:    Sodium 136 (*)    Chloride 95 (*)    Glucose, Bld 128 (*)    BUN 30 (*)    Creatinine, Ser 2.82 (*)    Alkaline Phosphatase 38 (*)    GFR calc non Af Amer 24 (*)    GFR calc Af Amer 28 (*)    Anion gap 16 (*)    All other components within normal limits  I-STAT ARTERIAL BLOOD GAS, ED - Abnormal; Notable for the following:    pH, Arterial 7.205 (*)    pCO2 arterial 71.9 (*)    pO2, Arterial 249.0 (*)    Bicarbonate 28.3 (*)    All other components within normal limits  CULTURE, BLOOD (ROUTINE X 2)  CULTURE, BLOOD (ROUTINE X 2)  CBC  ETHANOL  URINALYSIS, ROUTINE W REFLEX MICROSCOPIC  URINE RAPID DRUG SCREEN (HOSP PERFORMED)  BLOOD GAS, ARTERIAL  CBG MONITORING, ED  I-STAT CG4 LACTIC ACID, ED    Imaging Review Dg Chest 1 View  07/21/2014   CLINICAL DATA:  Shortness of breath  EXAM: CHEST - 1 VIEW  COMPARISON:  11/29/2009  FINDINGS: Right IJ porta catheter, tip at the SVC level. There  is borderline cardiomegaly, accentuated by technique. Negative aortic and hilar contours. Linear opacity at the left base favors scarring or atelectasis based on morphology. No edema, effusion, or pneumothorax.  Remote right ninth rib fracture.  IMPRESSION: No edema or definitive pneumonia.   Electronically Signed   By: Jorje Guild M.D.   On: 07/21/2014 00:11   Ct Head Wo Contrast  07/21/2014   CLINICAL DATA:  Found in garage by family, unresponsive. Somnolent. History of non-Hodgkin's lymphoma.  EXAM: CT HEAD WITHOUT CONTRAST  TECHNIQUE: Contiguous axial images  were obtained from the base of the skull through the vertex without intravenous contrast.  COMPARISON:  CT of the head December 01, 2010  FINDINGS: No intraparenchymal hemorrhage, mass effect, midline shift. Patchy to confluent supratentorial white matter hypodensities, advanced from prior imaging and advanced for age. Subcentimeter RIGHT thalamus lacunar infarct is new from prior imaging. No acute large vascular territory infarct. Dolicoectatic appearance of the intracranial vessels. No acute large vascular territory infarct.  No abnormal extra-axial fluid collection. Paranasal sinuses and mastoid air cells appear well-aerated. No skull fracture.  IMPRESSION: Age indeterminate RIGHT thalamus lacunar infarct, new from prior imaging.  Moderate to severe white matter changes, advanced for age and from prior imaging. Dolichoectatic intracranial vessels suggests chronic hypertension.   Electronically Signed   By: Elon Alas   On: 07/21/2014 00:00   Dg Foot Complete Right  07/21/2014   CLINICAL DATA:  Pain in the great toe.  No known injury.  EXAM: RIGHT FOOT COMPLETE - 3+ VIEW  COMPARISON:  01/24/2014  FINDINGS: Soft tissue gas in the medial great toe compatible with ulcer. No radiopaque foreign body. There is no acute erosion or acute periosteal reaction of the underlying great toe phalanges to suggest acute osteomyelitis (periarticular  lucency/erosive changes around the great toe interphalangeal joint are stable). There are chronic erosive changes to the second distal and middle phalanges. No acute fracture or malalignment.  IMPRESSION: 1. Great toe soft tissue ulcer. 2. Stable appearance of the phalanges; no evidence of acute osteomyelitis.   Electronically Signed   By: Jorje Guild M.D.   On: 07/21/2014 00:16     EKG Interpretation None      Date: 07/21/2014  Rate: 68  Rhythm: normal sinus rhythm  QRS Axis: normal  Intervals: normal  ST/T Wave abnormalities: normal  Conduction Disutrbances:none  Narrative Interpretation:   Old EKG Reviewed: none available  CRITICAL CARE Performed by: Lita Mains, Laquincy Eastridge Total critical care time: 50 min Critical care time was exclusive of separately billable procedures and treating other patients. Critical care was necessary to treat or prevent imminent or life-threatening deterioration. Critical care was time spent personally by me on the following activities: development of treatment plan with patient and/or surrogate as well as nursing, discussions with consultants, evaluation of patient's response to treatment, examination of patient, obtaining history from patient or surrogate, ordering and performing treatments and interventions, ordering and review of laboratory studies, ordering and review of radiographic studies, pulse oximetry and re-evaluation of patient's condition.  MDM   Final diagnoses:  Foot pain, right     I personally performed the services described in this documentation, which was scribed in my presence. The recorded information has been reviewed and is accurate.  Patient with history of HSV who presents with altered mental status and fever of 101.5. Initially denied drug use. Found by wife to be altered. Workup essentially negative including normal chest x-ray, normal white blood cell count, normal lactic acid, normal CT scan and right foot x-ray. LP was  unsuccessfully attempted to rule out meningitis/encephalitis. Given altered mental status, fever and concern for HSV encephalitis broad-spectrum antibiotics and acyclovir were started in the emergency department. Patient became increasingly somnolent with saturating oxygen saturations dropping into the 70s. ABG revealed respiratory acidosis with PCO2 of 70. Prior to intubation trial of Narcan given. Patient became much more awake. Heart rate increased. Patient did developed nausea and vomiting. Pupils dilated bilaterally to 5 mm. After further questioning patient admits to chronic morphine abuse. Critical care will  evaluate in the emergency department. Patient was placed on a Narcan drip.   Julianne Rice, MD 07/21/14 [phone]

## 2014-07-21 ENCOUNTER — Encounter (HOSPITAL_COMMUNITY): Payer: Self-pay | Admitting: Pulmonary Disease

## 2014-07-21 DIAGNOSIS — N184 Chronic kidney disease, stage 4 (severe): Secondary | ICD-10-CM | POA: Diagnosis present

## 2014-07-21 DIAGNOSIS — R4182 Altered mental status, unspecified: Secondary | ICD-10-CM | POA: Diagnosis present

## 2014-07-21 DIAGNOSIS — R0689 Other abnormalities of breathing: Secondary | ICD-10-CM

## 2014-07-21 DIAGNOSIS — L97519 Non-pressure chronic ulcer of other part of right foot with unspecified severity: Secondary | ICD-10-CM

## 2014-07-21 DIAGNOSIS — E1165 Type 2 diabetes mellitus with hyperglycemia: Secondary | ICD-10-CM | POA: Diagnosis present

## 2014-07-21 DIAGNOSIS — E11621 Type 2 diabetes mellitus with foot ulcer: Secondary | ICD-10-CM | POA: Diagnosis present

## 2014-07-21 DIAGNOSIS — F1910 Other psychoactive substance abuse, uncomplicated: Secondary | ICD-10-CM | POA: Diagnosis present

## 2014-07-21 DIAGNOSIS — E118 Type 2 diabetes mellitus with unspecified complications: Secondary | ICD-10-CM

## 2014-07-21 DIAGNOSIS — J9692 Respiratory failure, unspecified with hypercapnia: Secondary | ICD-10-CM | POA: Diagnosis present

## 2014-07-21 DIAGNOSIS — F1410 Cocaine abuse, uncomplicated: Secondary | ICD-10-CM | POA: Diagnosis present

## 2014-07-21 DIAGNOSIS — G9340 Encephalopathy, unspecified: Secondary | ICD-10-CM | POA: Diagnosis present

## 2014-07-21 DIAGNOSIS — Z8572 Personal history of non-Hodgkin lymphomas: Secondary | ICD-10-CM | POA: Diagnosis not present

## 2014-07-21 DIAGNOSIS — I129 Hypertensive chronic kidney disease with stage 1 through stage 4 chronic kidney disease, or unspecified chronic kidney disease: Secondary | ICD-10-CM | POA: Diagnosis present

## 2014-07-21 DIAGNOSIS — Z9081 Acquired absence of spleen: Secondary | ICD-10-CM | POA: Diagnosis present

## 2014-07-21 DIAGNOSIS — T40601A Poisoning by unspecified narcotics, accidental (unintentional), initial encounter: Secondary | ICD-10-CM | POA: Diagnosis present

## 2014-07-21 DIAGNOSIS — G510 Bell's palsy: Secondary | ICD-10-CM | POA: Diagnosis present

## 2014-07-21 DIAGNOSIS — F1290 Cannabis use, unspecified, uncomplicated: Secondary | ICD-10-CM | POA: Diagnosis present

## 2014-07-21 DIAGNOSIS — Z886 Allergy status to analgesic agent status: Secondary | ICD-10-CM | POA: Diagnosis not present

## 2014-07-21 DIAGNOSIS — L84 Corns and callosities: Secondary | ICD-10-CM | POA: Diagnosis present

## 2014-07-21 LAB — URINALYSIS, ROUTINE W REFLEX MICROSCOPIC
Bilirubin Urine: NEGATIVE
Glucose, UA: NEGATIVE mg/dL
HGB URINE DIPSTICK: NEGATIVE
Ketones, ur: NEGATIVE mg/dL
Leukocytes, UA: NEGATIVE
Nitrite: NEGATIVE
PH: 5 (ref 5.0–8.0)
Protein, ur: 30 mg/dL — AB
SPECIFIC GRAVITY, URINE: 1.028 (ref 1.005–1.030)
UROBILINOGEN UA: 0.2 mg/dL (ref 0.0–1.0)

## 2014-07-21 LAB — MRSA PCR SCREENING: MRSA BY PCR: NEGATIVE

## 2014-07-21 LAB — I-STAT ARTERIAL BLOOD GAS, ED
ACID-BASE DEFICIT: 3 mmol/L — AB (ref 0.0–2.0)
Acid-base deficit: 2 mmol/L (ref 0.0–2.0)
Bicarbonate: 28.3 mEq/L — ABNORMAL HIGH (ref 20.0–24.0)
Bicarbonate: 24 mEq/L (ref 20.0–24.0)
O2 SAT: 100 %
O2 Saturation: 88 %
PO2 ART: 249 mmHg — AB (ref 80.0–100.0)
Patient temperature: 99
TCO2: 30 mmol/L (ref 0–100)
TCO2: 25 mmol/L (ref 0–100)
pCO2 arterial: 71.9 mmHg (ref 35.0–45.0)
pCO2 arterial: 49.3 mmHg — ABNORMAL HIGH (ref 35.0–45.0)
pH, Arterial: 7.205 — ABNORMAL LOW (ref 7.350–7.450)
pH, Arterial: 7.297 — ABNORMAL LOW (ref 7.350–7.450)
pO2, Arterial: 61 mmHg — ABNORMAL LOW (ref 80.0–100.0)

## 2014-07-21 LAB — CBC
HCT: 39.7 % (ref 39.0–52.0)
Hemoglobin: 13 g/dL (ref 13.0–17.0)
MCH: 32.4 pg (ref 26.0–34.0)
MCHC: 32.7 g/dL (ref 30.0–36.0)
MCV: 99 fL (ref 78.0–100.0)
Platelets: 157 10*3/uL (ref 150–400)
RBC: 4.01 MIL/uL — ABNORMAL LOW (ref 4.22–5.81)
RDW: 14.2 % (ref 11.5–15.5)
WBC: 8 10*3/uL (ref 4.0–10.5)

## 2014-07-21 LAB — URINE MICROSCOPIC-ADD ON

## 2014-07-21 LAB — RAPID URINE DRUG SCREEN, HOSP PERFORMED
Amphetamines: NOT DETECTED
BENZODIAZEPINES: NOT DETECTED
Barbiturates: NOT DETECTED
Cocaine: POSITIVE — AB
OPIATES: POSITIVE — AB
Tetrahydrocannabinol: NOT DETECTED

## 2014-07-21 LAB — GLUCOSE, CAPILLARY
GLUCOSE-CAPILLARY: 133 mg/dL — AB (ref 70–99)
GLUCOSE-CAPILLARY: 94 mg/dL (ref 70–99)
Glucose-Capillary: 116 mg/dL — ABNORMAL HIGH (ref 70–99)
Glucose-Capillary: 122 mg/dL — ABNORMAL HIGH (ref 70–99)
Glucose-Capillary: 78 mg/dL (ref 70–99)
Glucose-Capillary: 95 mg/dL (ref 70–99)

## 2014-07-21 LAB — CREATININE, SERUM
Creatinine, Ser: 2.41 mg/dL — ABNORMAL HIGH (ref 0.50–1.35)
GFR calc Af Amer: 33 mL/min — ABNORMAL LOW (ref >90)
GFR calc non Af Amer: 29 mL/min — ABNORMAL LOW (ref >90)

## 2014-07-21 LAB — ETHANOL

## 2014-07-21 MED ORDER — SUCCINYLCHOLINE CHLORIDE 20 MG/ML IJ SOLN
INTRAMUSCULAR | Status: AC
  Filled 2014-07-21: qty 1

## 2014-07-21 MED ORDER — PENICILLIN V POTASSIUM 500 MG PO TABS
500.0000 mg | ORAL_TABLET | Freq: Two times a day (BID) | ORAL | Status: DC
  Filled 2014-07-21 (×2): qty 1

## 2014-07-21 MED ORDER — SODIUM CHLORIDE 0.9 % IV SOLN
1500.0000 mg | INTRAVENOUS | Status: DC
  Administered 2014-07-22: 1500 mg via INTRAVENOUS
  Filled 2014-07-21 (×2): qty 1500

## 2014-07-21 MED ORDER — SUCCINYLCHOLINE CHLORIDE 20 MG/ML IJ SOLN: 100.0000 mg | Freq: Once | INTRAMUSCULAR | Status: DC

## 2014-07-21 MED ORDER — DOCUSATE SODIUM 100 MG PO CAPS
100.0000 mg | ORAL_CAPSULE | Freq: Two times a day (BID) | ORAL | Status: DC | PRN
  Filled 2014-07-21: qty 1

## 2014-07-21 MED ORDER — FOLIC ACID 5 MG/ML IJ SOLN
1.0000 mg | Freq: Every day | INTRAMUSCULAR | Status: DC
  Administered 2014-07-21 – 2014-07-22 (×2): 1 mg via INTRAVENOUS
  Filled 2014-07-21 (×2): qty 0.2

## 2014-07-21 MED ORDER — CETYLPYRIDINIUM CHLORIDE 0.05 % MT LIQD
7.0000 mL | Freq: Two times a day (BID) | OROMUCOSAL | Status: DC
  Administered 2014-07-21 – 2014-07-22 (×3): 7 mL via OROMUCOSAL

## 2014-07-21 MED ORDER — ROSUVASTATIN CALCIUM 20 MG PO TABS
20.0000 mg | ORAL_TABLET | Freq: Every day | ORAL | Status: DC
  Administered 2014-07-21 – 2014-07-23 (×3): 20 mg via ORAL
  Filled 2014-07-21 (×3): qty 1

## 2014-07-21 MED ORDER — ONDANSETRON HCL 4 MG/2ML IJ SOLN
INTRAMUSCULAR | Status: AC
  Filled 2014-07-21: qty 2

## 2014-07-21 MED ORDER — ROCURONIUM BROMIDE 50 MG/5ML IV SOLN
INTRAVENOUS | Status: AC
  Filled 2014-07-21: qty 2

## 2014-07-21 MED ORDER — ETOMIDATE 2 MG/ML IV SOLN: 20.0000 mg | Freq: Once | INTRAVENOUS | Status: DC

## 2014-07-21 MED ORDER — SODIUM CHLORIDE 0.9 % IV SOLN: 250.0000 mL | INTRAVENOUS | Status: DC | PRN

## 2014-07-21 MED ORDER — NALOXONE HCL 1 MG/ML IJ SOLN
1.0000 mg | Freq: Once | INTRAMUSCULAR | Status: AC
  Administered 2014-07-21: 1 mg via INTRAVENOUS

## 2014-07-21 MED ORDER — LISINOPRIL 10 MG PO TABS
10.0000 mg | ORAL_TABLET | Freq: Every day | ORAL | Status: DC
  Administered 2014-07-21 – 2014-07-22 (×2): 10 mg via ORAL
  Filled 2014-07-21 (×3): qty 1

## 2014-07-21 MED ORDER — PROMETHAZINE HCL 25 MG/ML IJ SOLN
12.5000 mg | Freq: Once | INTRAMUSCULAR | Status: AC
  Administered 2014-07-21: 12.5 mg via INTRAVENOUS
  Filled 2014-07-21: qty 1

## 2014-07-21 MED ORDER — ACYCLOVIR 800 MG PO TABS: 800.0000 mg | ORAL_TABLET | Freq: Two times a day (BID) | ORAL | Status: DC

## 2014-07-21 MED ORDER — NALOXONE HCL 1 MG/ML IJ SOLN
INTRAMUSCULAR | Status: AC
  Filled 2014-07-21: qty 2

## 2014-07-21 MED ORDER — NALOXONE HCL 1 MG/ML IJ SOLN
1.0000 mg | Freq: Once | INTRAMUSCULAR | Status: AC
  Administered 2014-07-21: 1 mg via INTRAVENOUS
  Filled 2014-07-21: qty 2

## 2014-07-21 MED ORDER — LIDOCAINE HCL (CARDIAC) 20 MG/ML IV SOLN
INTRAVENOUS | Status: AC
  Filled 2014-07-21: qty 5

## 2014-07-21 MED ORDER — ONDANSETRON HCL 4 MG/2ML IJ SOLN: 4.0000 mg | Freq: Once | INTRAMUSCULAR | Status: AC

## 2014-07-21 MED ORDER — ONDANSETRON HCL 4 MG/2ML IJ SOLN
4.0000 mg | Freq: Once | INTRAMUSCULAR | Status: AC
  Administered 2014-07-21: 4 mg via INTRAVENOUS

## 2014-07-21 MED ORDER — HEPARIN SODIUM (PORCINE) 5000 UNIT/ML IJ SOLN
5000.0000 [IU] | Freq: Three times a day (TID) | INTRAMUSCULAR | Status: DC
Start: 2014-07-21 — End: 2014-07-23
  Administered 2014-07-21 – 2014-07-23 (×7): 5000 [IU] via SUBCUTANEOUS
  Filled 2014-07-21 (×8): qty 1

## 2014-07-21 MED ORDER — ETOMIDATE 2 MG/ML IV SOLN
INTRAVENOUS | Status: AC
  Filled 2014-07-21: qty 20

## 2014-07-21 MED ORDER — THIAMINE HCL 100 MG/ML IJ SOLN
100.0000 mg | Freq: Every day | INTRAMUSCULAR | Status: DC
  Administered 2014-07-21 – 2014-07-22 (×2): 100 mg via INTRAVENOUS
  Filled 2014-07-21 (×2): qty 1

## 2014-07-21 MED ORDER — VITAMIN B-6 100 MG PO TABS
100.0000 mg | ORAL_TABLET | Freq: Every morning | ORAL | Status: DC
  Administered 2014-07-21 – 2014-07-23 (×3): 100 mg via ORAL
  Filled 2014-07-21 (×3): qty 1

## 2014-07-21 MED ORDER — AMLODIPINE BESYLATE 10 MG PO TABS
10.0000 mg | ORAL_TABLET | Freq: Every morning | ORAL | Status: DC
  Administered 2014-07-21 – 2014-07-23 (×3): 10 mg via ORAL
  Filled 2014-07-21 (×3): qty 1

## 2014-07-21 MED ORDER — PIPERACILLIN-TAZOBACTAM 3.375 G IVPB
3.3750 g | Freq: Three times a day (TID) | INTRAVENOUS | Status: DC
  Administered 2014-07-21 – 2014-07-22 (×4): 3.375 g via INTRAVENOUS
  Filled 2014-07-21 (×6): qty 50

## 2014-07-21 MED ORDER — GABAPENTIN 300 MG PO CAPS
600.0000 mg | ORAL_CAPSULE | Freq: Three times a day (TID) | ORAL | Status: DC
  Administered 2014-07-21 – 2014-07-23 (×7): 600 mg via ORAL
  Filled 2014-07-21 (×9): qty 2

## 2014-07-21 MED ORDER — ONDANSETRON HCL 4 MG/2ML IJ SOLN
INTRAMUSCULAR | Status: AC
  Administered 2014-07-21: 4 mg
  Filled 2014-07-21: qty 2

## 2014-07-21 MED ORDER — NALOXONE HCL 1 MG/ML IJ SOLN
0.5000 mg/h | INTRAMUSCULAR | Status: DC
  Administered 2014-07-21 (×2): 0.5 mg/h via INTRAVENOUS
  Filled 2014-07-21 (×3): qty 4

## 2014-07-21 NOTE — ED Notes (Signed)
MD at bedside. 

## 2014-07-21 NOTE — Progress Notes (Signed)
PULMONARY / CRITICAL CARE MEDICINE HISTORY AND PHYSICAL EXAMINATION   Name: Cory Marks MRN: [id number] DOB: 05-19-1961    ADMISSION DATE:  07/20/2014  PRIMARY SERVICE: PCCM  CHIEF COMPLAINT:  Altered Mental status  BRIEF PATIENT DESCRIPTION: 53 y/o man with MMP, including substance abuse and R toe ucler who remains narcan dependent after presenting with AMS and reporting taking narcotics for his toe pain.  SIGNIFICANT EVENTS / STUDIES:  Narcan x3  LINES / TUBES: PIV x3   CULTURES: Blood x2 - 07/21/14  ANTIBIOTICS: Acyclovir (home med) Pip-tazo - 07/21/14 Vanc - 07/21/14  SUBJECTIVE:  Awake, toe pain improved.  Narcan gtt running  VITAL SIGNS: Temp:  [98.8 F (37.1 C)-101.5 F (38.6 C)] 99.4 F (37.4 C) (12/14 0700) Pulse Rate:  [44-81] 60 (12/14 0600) Resp:  [12-22] 22 (12/14 0800) BP: (110-163)/(48-119) 163/105 mmHg (12/14 0800) SpO2:  [77 %-100 %] 100 % (12/14 0800) Weight:  [107.6 kg (237 lb 3.4 oz)-108.863 kg (240 lb)] 107.6 kg (237 lb 3.4 oz) (12/14 0530) HEMODYNAMICS:   VENTILATOR SETTINGS:   INTAKE / OUTPUT: Intake/Output      12/13 0701 - 12/14 0700 12/14 0701 - 12/15 0700   I.V. (mL/kg) 134.1 (1.2) 31.3 (0.3)   Total Intake(mL/kg) 134.1 (1.2) 31.3 (0.3)   Urine (mL/kg/hr) 550    Total Output 550     Net -415.9 +31.3          PHYSICAL EXAMINATION: General:  Middle aged man, awake on narcan gtt Neuro:  Mild left facial droop (hx of Bell's palsy), awake and alert HEENT:  MMM Neck: No JVD Cardiovascular:  RRR Lungs:  CTAB Abdomen:  Soft, non-tender Musculoskeletal:  Ulcer on lateral R great toe, tender, no discharge Skin:  No rashes  LABS:  CBC  Recent Labs Lab 07/20/14 2243 07/21/14 0404  WBC 7.1 8.0  HGB 14.2 13.0  HCT 43.3 39.7  PLT 171 157   Coag's No results for input(s): APTT, INR in the last 168 hours. BMET  Recent Labs Lab 07/20/14 2243 07/21/14 0404  NA 136*  --   K 4.4  --   CL 95*  --   CO2 25  --   BUN 30*   --   CREATININE 2.82* 2.41*  GLUCOSE 128*  --    Electrolytes  Recent Labs Lab 07/20/14 2243  CALCIUM 10.1   Sepsis Markers  Recent Labs Lab 07/20/14 2251  LATICACIDVEN 1.93   ABG  Recent Labs Lab 07/21/14 0105 07/21/14 0211  PHART 7.205* 7.297*  PCO2ART 71.9* 49.3*  PO2ART 249.0* 61.0*   Liver Enzymes  Recent Labs Lab 07/20/14 2243  AST 33  ALT 23  ALKPHOS 38*  BILITOT 0.6  ALBUMIN 4.3   Cardiac Enzymes No results for input(s): TROPONINI, PROBNP in the last 168 hours. Glucose  Recent Labs Lab 07/21/14 0607 07/21/14 0735  GLUCAP 95 94    Imaging Dg Chest 1 View  07/21/2014   CLINICAL DATA:  Shortness of breath  EXAM: CHEST - 1 VIEW  COMPARISON:  11/29/2009  FINDINGS: Right IJ porta catheter, tip at the SVC level. There is borderline cardiomegaly, accentuated by technique. Negative aortic and hilar contours. Linear opacity at the left base favors scarring or atelectasis based on morphology. No edema, effusion, or pneumothorax.  Remote right ninth rib fracture.  IMPRESSION: No edema or definitive pneumonia.   Electronically Signed   By: Jorje Guild M.D.   On: 07/21/2014 00:11   Ct Head Wo Contrast  07/21/2014  CLINICAL DATA:  Found in garage by family, unresponsive. Somnolent. History of non-Hodgkin's lymphoma.  EXAM: CT HEAD WITHOUT CONTRAST  TECHNIQUE: Contiguous axial images were obtained from the base of the skull through the vertex without intravenous contrast.  COMPARISON:  CT of the head December 01, 2010  FINDINGS: No intraparenchymal hemorrhage, mass effect, midline shift. Patchy to confluent supratentorial white matter hypodensities, advanced from prior imaging and advanced for age. Subcentimeter RIGHT thalamus lacunar infarct is new from prior imaging. No acute large vascular territory infarct. Dolicoectatic appearance of the intracranial vessels. No acute large vascular territory infarct.  No abnormal extra-axial fluid collection. Paranasal sinuses  and mastoid air cells appear well-aerated. No skull fracture.  IMPRESSION: Age indeterminate RIGHT thalamus lacunar infarct, new from prior imaging.  Moderate to severe white matter changes, advanced for age and from prior imaging. Dolichoectatic intracranial vessels suggests chronic hypertension.   Electronically Signed   By: Elon Alas   On: 07/21/2014 00:00   Dg Foot Complete Right  07/21/2014   CLINICAL DATA:  Pain in the great toe.  No known injury.  EXAM: RIGHT FOOT COMPLETE - 3+ VIEW  COMPARISON:  01/24/2014  FINDINGS: Soft tissue gas in the medial great toe compatible with ulcer. No radiopaque foreign body. There is no acute erosion or acute periosteal reaction of the underlying great toe phalanges to suggest acute osteomyelitis (periarticular lucency/erosive changes around the great toe interphalangeal joint are stable). There are chronic erosive changes to the second distal and middle phalanges. No acute fracture or malalignment.  IMPRESSION: 1. Great toe soft tissue ulcer. 2. Stable appearance of the phalanges; no evidence of acute osteomyelitis.   Electronically Signed   By: Jorje Guild M.D.   On: 07/21/2014 00:16    EKG: NSR CXR: Enlarged cardiac silhouette, low long volumes, possible small left effusion vs atelectasis  ASSESSMENT / PLAN:  Active Problems:   HTN (hypertension)   Diabetes mellitus type 2, controlled, with complications   Foot ulcer, right   Hypercarbia   PULMONARY A: Hypercarbic Respiratory failure due to opiate effect, improved P:   Resolving w/ narcan administration. Monitor in ICU for decreased wakefulness / hypercarbia. Weaning Narcan today 12/14  CARDIOVASCULAR A: HTN Cocaine use P:   Continue home amlodipine.  Cautious monitoring of hemodynamics.  RENAL A: CKD IV P:   Renally dose ABX and other medications. Avoid nephrotoxic agents  GASTROINTESTINAL A: No active issues P:   NPO while on narcan  HEMATOLOGIC A: Asplenic Hx  of mantle cell lymphoma / auto-SCT P:   Continue PCN ppx (home med).  INFECTIOUS A: Diabetic foot ulcer infection P:   Does not meet SIRS critiera, will treat w/ ABX.  Get wound care consult 12/14  ENDOCRINE A: Diabetes type 2, uncontrolled P:   Phase 1 ICU glycemic control protocol.  NEUROLOGIC A: Encephalopathy due to opiate overdose P:   Effects of opiates may be compounded by CKD and longer half-life. Continue narcan infusion.   BEST PRACTICE / DISPOSITION Level of Care:  ICU Primary Service:  PCCM Consultants:  None Code Status:  Full Diet:  NPO DVT Px:  Heparin GI Px:  None indicated Skin Integrity:  Intact, save for toe Social / Family:  Patient has capacity  TODAY'S SUMMARY: 53 y/o man with polysubstance abuse p/w pain, opiate overdose, and need for narcan infusion.  I have personally obtained a history, examined the patient, evaluated laboratory and imaging results, formulated the assessment and plan and placed orders.  CRITICAL  CARE: The patient is critically ill with multiple organ systems failure and requires high complexity decision making for assessment and support, frequent evaluation and titration of therapies, application of advanced monitoring technologies and extensive interpretation of multiple databases. Critical Care Time devoted to patient care services described in this note is 35 minutes.   Baltazar Apo, MD, PhD 07/21/2014, 11:05 AM [facility] [phone] or if no answer [phone]

## 2014-07-21 NOTE — Progress Notes (Signed)
Utilization review complete. Jatorian Renault RN CCM Case Mgmt phone [phone] 

## 2014-07-21 NOTE — Progress Notes (Signed)
ANTIBIOTIC CONSULT NOTE - INITIAL  Pharmacy Consult for Vancomycin/Zosyn  Indication: rule out sepsis, foot ulcer  Allergies  Allergen Reactions  . Oxycodone Itching and Palpitations    Patient Measurements: Height: 6' (182.9 cm) Weight: 240 lb (108.863 kg) IBW/kg (Calculated) : 77.6  Vital Signs: Temp: 98.8 F (37.1 C) (12/14 0337) Temp Source: Oral (12/14 0035) BP: 154/111 mmHg (12/14 0433) Pulse Rate: 61 (12/14 0433)  Labs:  Recent Labs  07/20/14 2243  WBC 7.1  HGB 14.2  PLT 171  CREATININE 2.82*   Estimated Creatinine Clearance: 38.2 mL/min (by C-G formula based on Cr of 2.82).  Medical History: Past Medical History  Diagnosis Date  . Hypertension   . Diabetes mellitus   . Bell's palsy   . Cancer     Mantle Cell Cancer- S/P stem cell transplant  . History of splenectomy   . Polysubstance abuse     Assessment: 53 y/o M to start broad spectrum antibiotics for r/o sepsis and diabetic foot ulcer, WBC WNL, renal dysfunction present with CrCl ~35-40, other labs as above.   Goal of Therapy:  Vancomycin trough level 15-20 mcg/ml  Plan:  -Vancomycin 1500 mg IV q24h -Zosyn 3.375G IV q8h to be infused over 4 hours -Trend WBC, temp, renal function  -Drug levels as indicated   Narda Bonds 07/21/2014,5:30 AM

## 2014-07-21 NOTE — Progress Notes (Signed)
SLP Cancellation Note  Patient Details Name: Cory Marks MRN: [id number] DOB: June 12, 1961   Cancelled treatment:       Reason Eval/Treat Not Completed: Patient's level of consciousness   Ashyia Schraeder, Katherene Ponto 07/21/2014, 9:29 AM

## 2014-07-21 NOTE — Consult Note (Addendum)
WOC wound consult note Reason for Consult: Consult requested for chronic wound to right great toe. EMR indicates he is followed by the outpatient wound care center. Pt does not awaken or answer questions regarding wound or previous topical treatment. X-ray does not show osteomyelitis, but indicates gas collection in the soft tissue; this is beyond [facility] scope of practice; consult ortho service if aggressive plan of care is desired.  Wound type: Full thickness  Measurement: .8X.8X.8cm; bone palpable when probed with swab.  Dry black callous located nearby is .2X.2cm and tightly adhered. Wound bed: dark red Drainage (amount, consistency, odor) mod amt tan drainage, no odor Periwound: Fluctuance surrounding Dressing procedure/placement/frequency: Aquacel to absorb drainage and provide antimicrobial benefits. Pt can resume follow-up with the outpatient wound care center after discharge. Please re-consult if further assistance is needed.  Thank-you,  Julien Girt MSN, [place], [place], [place], [place]

## 2014-07-21 NOTE — H&P (Signed)
PULMONARY / CRITICAL CARE MEDICINE HISTORY AND PHYSICAL EXAMINATION   Name: Cory Marks MRN: [id number] DOB: 16-Nov-1960    ADMISSION DATE:  07/20/2014  PRIMARY SERVICE: PCCM  CHIEF COMPLAINT:  Altered Mental status  BRIEF PATIENT DESCRIPTION: 53 y/o man with MMP, including substance abuse and R toe ucler who remains narcan dependent after presenting with AMS and reporting taking narcotics for his toe pain.  SIGNIFICANT EVENTS / STUDIES:  Narcan x3  LINES / TUBES: PIV x3   CULTURES: Blood x2 - 07/21/14  ANTIBIOTICS: Acyclovir (home med) Pip-tazo - 07/21/14 Vanc - 07/21/14  HISTORY OF PRESENT ILLNESS:   Cory Marks is a 53 y/o man with a complex PMHx notable for prior mantle cell lymphoma in 2012 for which he ultimately underwent autologous SCT at [facility] as well polysubstance abuse, CKD, and splenic rupture which required splenectomy. He has developed a painful ulcer on his right great toe and was seeking relief for this, including morphine (from father's prescription), as well as recreational cocaine, moonshine, and other drugs. He became more somnolent and was brought to the ED where he was found to have a respiratory acidosis. He was given narcan and responded well. He again became somnolent and again responded to narcan. He ultimately required a narcan drip and PCCM was called for admission.  PAST MEDICAL HISTORY :  Past Medical History  Diagnosis Date  . Hypertension   . Diabetes mellitus   . Bell's palsy   . Cancer     Mantle Cell Cancer- S/P stem cell transplant  . History of splenectomy   . Polysubstance abuse    Past Surgical History  Procedure Laterality Date  . Splenectomy, total    . Edentulation N/A y-2    prior to transplant   Prior to Admission medications   Medication Sig Start Date End Date Taking? Authorizing Provider  amLODipine (NORVASC) 10 MG tablet Take 10 mg by mouth every morning.   Yes Historical Provider, MD  cetirizine (ZYRTEC) 10 MG tablet  Take 10 mg by mouth daily as needed for allergies.   Yes Historical Provider, MD  docusate sodium (COLACE) 100 MG capsule Take 100 mg by mouth daily as needed for constipation.   Yes Historical Provider, MD  gabapentin (NEURONTIN) 300 MG capsule Take 1 capsule (300 mg total) by mouth 3 (three) times daily. Patient taking differently: Take 600 mg by mouth 3 (three) times daily.  05/05/14  Yes Leana Gamer, MD  lisinopril (PRINIVIL,ZESTRIL) 10 MG tablet Take 1 tablet (10 mg total) by mouth daily. 05/07/14  Yes Dorena Dew, FNP  OVER THE COUNTER MEDICATION Take 1 capsule by mouth daily as needed (erectile dysfunction). X AGAIN   Yes Historical Provider, MD  penicillin v potassium (VEETID) 500 MG tablet Take 500 mg by mouth 2 (two) times daily.   Yes Historical Provider, MD  pyridOXINE (VITAMIN B-6) 100 MG tablet Take 100 mg by mouth every morning.    Yes Historical Provider, MD  rosuvastatin (CRESTOR) 20 MG tablet Take 1 tablet (20 mg total) by mouth daily. 01/31/14  Yes Leana Gamer, MD  voriconazole (VFEND) 200 MG tablet Take 200 mg by mouth 2 (two) times daily.   Yes Historical Provider, MD  acyclovir (ZOVIRAX) 800 MG tablet Take 800 mg by mouth 2 (two) times daily.    Historical Provider, MD   Allergies  Allergen Reactions  . Oxycodone Itching and Palpitations    FAMILY HISTORY:  Family History  Problem Relation Age of Onset  .  Hypertension Mother   . Diabetes Mother   . Cancer Father   . Diabetes Father   . Heart disease Father   . Diabetes Sister    SOCIAL HISTORY:  reports that he has never smoked. He does not have any smokeless tobacco history on file. He reports that he drinks about 0.6 oz of alcohol per week. He reports that he uses illicit drugs (Marijuana).  REVIEW OF SYSTEMS:  10 systems reviewed and negative except per HPI  SUBJECTIVE: Patient feeling improved, c/o pain in toe.  VITAL SIGNS: Temp:  [98.8 F (37.1 C)-101.5 F (38.6 C)] 98.8 F (37.1 C)  (12/14 0337) Pulse Rate:  [44-81] 58 (12/14 0330) Resp:  [12-20] 20 (12/14 0330) BP: (110-156)/(48-119) 145/91 mmHg (12/14 0330) SpO2:  [77 %-100 %] 100 % (12/14 0330) Weight:  [240 lb (108.863 kg)] 240 lb (108.863 kg) (12/14 0119) HEMODYNAMICS:   VENTILATOR SETTINGS:   INTAKE / OUTPUT: Intake/Output    None     PHYSICAL EXAMINATION: General:  Middle aged man, awake lying in stretcher Neuro:  Mild left facial droop (hx of Bell's palsy), moderate nystagmus when looking to right. HEENT:  MMM Neck: No JVD Cardiovascular:  RRR Lungs:  CTAB Abdomen:  Soft, non-tender Musculoskeletal:  Ulcer on lateral R great toe Skin:  No rashes  LABS:  CBC  Recent Labs Lab 07/20/14 2243  WBC 7.1  HGB 14.2  HCT 43.3  PLT 171   Coag's No results for input(s): APTT, INR in the last 168 hours. BMET  Recent Labs Lab 07/20/14 2243  NA 136*  K 4.4  CL 95*  CO2 25  BUN 30*  CREATININE 2.82*  GLUCOSE 128*   Electrolytes  Recent Labs Lab 07/20/14 2243  CALCIUM 10.1   Sepsis Markers  Recent Labs Lab 07/20/14 2251  LATICACIDVEN 1.93   ABG  Recent Labs Lab 07/21/14 0105 07/21/14 0211  PHART 7.205* 7.297*  PCO2ART 71.9* 49.3*  PO2ART 249.0* 61.0*   Liver Enzymes  Recent Labs Lab 07/20/14 2243  AST 33  ALT 23  ALKPHOS 38*  BILITOT 0.6  ALBUMIN 4.3   Cardiac Enzymes No results for input(s): TROPONINI, PROBNP in the last 168 hours. Glucose No results for input(s): GLUCAP in the last 168 hours.  Imaging Dg Chest 1 View  07/21/2014   CLINICAL DATA:  Shortness of breath  EXAM: CHEST - 1 VIEW  COMPARISON:  11/29/2009  FINDINGS: Right IJ porta catheter, tip at the SVC level. There is borderline cardiomegaly, accentuated by technique. Negative aortic and hilar contours. Linear opacity at the left base favors scarring or atelectasis based on morphology. No edema, effusion, or pneumothorax.  Remote right ninth rib fracture.  IMPRESSION: No edema or definitive  pneumonia.   Electronically Signed   By: Jorje Guild M.D.   On: 07/21/2014 00:11   Ct Head Wo Contrast  07/21/2014   CLINICAL DATA:  Found in garage by family, unresponsive. Somnolent. History of non-Hodgkin's lymphoma.  EXAM: CT HEAD WITHOUT CONTRAST  TECHNIQUE: Contiguous axial images were obtained from the base of the skull through the vertex without intravenous contrast.  COMPARISON:  CT of the head December 01, 2010  FINDINGS: No intraparenchymal hemorrhage, mass effect, midline shift. Patchy to confluent supratentorial white matter hypodensities, advanced from prior imaging and advanced for age. Subcentimeter RIGHT thalamus lacunar infarct is new from prior imaging. No acute large vascular territory infarct. Dolicoectatic appearance of the intracranial vessels. No acute large vascular territory infarct.  No abnormal extra-axial  fluid collection. Paranasal sinuses and mastoid air cells appear well-aerated. No skull fracture.  IMPRESSION: Age indeterminate RIGHT thalamus lacunar infarct, new from prior imaging.  Moderate to severe white matter changes, advanced for age and from prior imaging. Dolichoectatic intracranial vessels suggests chronic hypertension.   Electronically Signed   By: Elon Alas   On: 07/21/2014 00:00   Dg Foot Complete Right  07/21/2014   CLINICAL DATA:  Pain in the great toe.  No known injury.  EXAM: RIGHT FOOT COMPLETE - 3+ VIEW  COMPARISON:  01/24/2014  FINDINGS: Soft tissue gas in the medial great toe compatible with ulcer. No radiopaque foreign body. There is no acute erosion or acute periosteal reaction of the underlying great toe phalanges to suggest acute osteomyelitis (periarticular lucency/erosive changes around the great toe interphalangeal joint are stable). There are chronic erosive changes to the second distal and middle phalanges. No acute fracture or malalignment.  IMPRESSION: 1. Great toe soft tissue ulcer. 2. Stable appearance of the phalanges; no evidence  of acute osteomyelitis.   Electronically Signed   By: Jorje Guild M.D.   On: 07/21/2014 00:16    EKG: NSR CXR: Enlarged cardiac silhouette, low long volumes, possible small left effusion vs atelectasis  ASSESSMENT / PLAN:  Active Problems:   HTN (hypertension)   Diabetes mellitus type 2, controlled, with complications   Foot ulcer, right   Hypercarbia   PULMONARY A: Hypercarbic Respiratory failure due to opiate effect P:   Resolved w/ narcan administration. Monitor in ICU for decreased wakefulness / hypercarbia.  CARDIOVASCULAR A: HTN Cocaine use P:   Continue home amlodipine. Cautious monitoring of hemodynamics.  RENAL A: CKD IV P:   Renally dose ABX and other medications. Avoid nephrotoxic agents  GASTROINTESTINAL A: No active issues P:   NPO while on narcan  HEMATOLOGIC A: Asplenic Hx of mantle cell lymphoma / auto-SCT P:   Continue PCN ppx (home med).  INFECTIOUS A: Diabetic foot ulcer infection P:   Does not meet SIRS critiera, will treat w/ ABX. Consider wound / ortho consult.  ENDOCRINE A: Diabetes type 2, uncontrolled P:   Phase 1 ICU glycemic control protocol.  NEUROLOGIC A: Encephalopathy due to opiate overdose P:   Effects of opiates may be compounded by CKD and longer half-life. Continue narcan infusion.  BEST PRACTICE / DISPOSITION Level of Care:  ICU Primary Service:  PCCM Consultants:  None Code Status:  Full Diet:  NPO DVT Px:  Heparin GI Px:  None indicated Skin Integrity:  Intact, save for toe Social / Family:  Patient has capacity  TODAY'S SUMMARY: 54 y/o man with polysubstance abuse p/w pain, opiate overdose, and need for narcan infusion.  I have personally obtained a history, examined the patient, evaluated laboratory and imaging results, formulated the assessment and plan and placed orders.  CRITICAL CARE: The patient is critically ill with multiple organ systems failure and requires high complexity decision  making for assessment and support, frequent evaluation and titration of therapies, application of advanced monitoring technologies and extensive interpretation of multiple databases. Critical Care Time devoted to patient care services described in this note is 60 minutes.   Luz Brazen, MD Pulmonary and [place] Pager: [phone]   07/21/2014, 4:10 AM

## 2014-07-22 LAB — BASIC METABOLIC PANEL
Anion gap: 11 (ref 5–15)
BUN: 20 mg/dL (ref 6–23)
CALCIUM: 9.3 mg/dL (ref 8.4–10.5)
CO2: 28 meq/L (ref 19–32)
Chloride: 104 mEq/L (ref 96–112)
Creatinine, Ser: 1.88 mg/dL — ABNORMAL HIGH (ref 0.50–1.35)
GFR calc Af Amer: 45 mL/min — ABNORMAL LOW (ref >90)
GFR calc non Af Amer: 39 mL/min — ABNORMAL LOW (ref >90)
Glucose, Bld: 82 mg/dL (ref 70–99)
Potassium: 4.3 mEq/L (ref 3.7–5.3)
SODIUM: 143 meq/L (ref 137–147)

## 2014-07-22 LAB — GLUCOSE, CAPILLARY
GLUCOSE-CAPILLARY: 75 mg/dL (ref 70–99)
Glucose-Capillary: 95 mg/dL (ref 70–99)
Glucose-Capillary: 120 mg/dL — ABNORMAL HIGH (ref 70–99)

## 2014-07-22 LAB — PHOSPHORUS: Phosphorus: 2.7 mg/dL (ref 2.3–4.6)

## 2014-07-22 LAB — CBC
HCT: 41.2 % (ref 39.0–52.0)
HEMOGLOBIN: 13.2 g/dL (ref 13.0–17.0)
MCH: 31.4 pg (ref 26.0–34.0)
MCHC: 32 g/dL (ref 30.0–36.0)
MCV: 98.1 fL (ref 78.0–100.0)
Platelets: 162 10*3/uL (ref 150–400)
RBC: 4.2 MIL/uL — AB (ref 4.22–5.81)
RDW: 14 % (ref 11.5–15.5)
WBC: 4 10*3/uL (ref 4.0–10.5)

## 2014-07-22 LAB — MAGNESIUM: Magnesium: 1.8 mg/dL (ref 1.5–2.5)

## 2014-07-22 MED ORDER — INSULIN ASPART 100 UNIT/ML ~~LOC~~ SOLN: 0.0000 [IU] | Freq: Three times a day (TID) | SUBCUTANEOUS | Status: DC

## 2014-07-22 MED ORDER — INSULIN ASPART 100 UNIT/ML ~~LOC~~ SOLN: 0.0000 [IU] | Freq: Every day | SUBCUTANEOUS | Status: DC

## 2014-07-22 MED ORDER — PENICILLIN V POTASSIUM 500 MG PO TABS
500.0000 mg | ORAL_TABLET | Freq: Two times a day (BID) | ORAL | Status: DC
  Administered 2014-07-22 – 2014-07-23 (×3): 500 mg via ORAL
  Filled 2014-07-22 (×5): qty 1

## 2014-07-22 NOTE — Evaluation (Signed)
Clinical/Bedside Swallow Evaluation Patient Details  Name: Cory Marks MRN: [id number] Date of Birth: Feb 15, 1961  Today's Date: 07/22/2014 Time: 8182-9937 SLP Time Calculation (min) (ACUTE ONLY): 18 min  Past Medical History:  Past Medical History  Diagnosis Date  . Hypertension   . Diabetes mellitus   . Bell's palsy   . Cancer     Mantle Cell Cancer- S/P stem cell transplant  . History of splenectomy   . Polysubstance abuse    Past Surgical History:  Past Surgical History  Procedure Laterality Date  . Splenectomy, total    . Edentulation N/A y-2    prior to transplant   HPI:  Cory Marks is a 53 y/o man with a complex PMHx notable for prior mantle cell lymphoma in 2012 for which he ultimately underwent autologous SCT at [facility] as well polysubstance abuse, CKD, and splenic rupture which required splenectomy. He has developed a painful ulcer on his right great toe and was seeking relief for this, including morphine (from father's prescription), as well as recreational cocaine, moonshine, and other drugs. He became more somnolent and was brought to the ED where he was found to have a respiratory acidosis. He was given narcan and responded well. He again became somnolent and again responded to narcan. He ultimately required a narcan drip   Assessment / Plan / Recommendation Clinical Impression  Pt demonstrates adequate oropharyngeal swallow function at this time. No signs of aspiration were noted across trials of solids or thin liquids. Pt does report that he swallows up to 12 pills at once and sometimes has difficulty getting them down. SLP recommended swallowing one pill at a time and educated pt regarding esophageal function and possible damage/problems that could be caused by swallowing so many pills at once (blockage, breakdown of pills in esophagus). No further speech therapy services are needed at this time. SLP signing off.    Aspiration Risk  Mild    Diet Recommendation  Regular;Thin liquid   Liquid Administration via: Cup;Straw Medication Administration: Whole meds with liquid Supervision: Patient able to self feed Postural Changes and/or Swallow Maneuvers: Seated upright 90 degrees    Other  Recommendations Oral Care Recommendations: Oral care BID   Follow Up Recommendations       Frequency and Duration        Pertinent Vitals/Pain none   SLP Swallow Goals     Swallow Study Prior Functional Status       General Date of Onset: 07/20/14 HPI: Cory Marks is a 53 y/o man with a complex PMHx notable for prior mantle cell lymphoma in 2012 for which he ultimately underwent autologous SCT at [facility] as well polysubstance abuse, CKD, and splenic rupture which required splenectomy. He has developed a painful ulcer on his right great toe and was seeking relief for this, including morphine (from father's prescription), as well as recreational cocaine, moonshine, and other drugs. He became more somnolent and was brought to the ED where he was found to have a respiratory acidosis. He was given narcan and responded well. He again became somnolent and again responded to narcan. He ultimately required a narcan drip Type of Study: Bedside swallow evaluation Previous Swallow Assessment: no Diet Prior to this Study: Regular;Thin liquids Temperature Spikes Noted: No Respiratory Status: Nasal cannula History of Recent Intubation: No Behavior/Cognition: Alert;Cooperative Oral Cavity - Dentition: Adequate natural dentition Self-Feeding Abilities: Able to feed self Patient Positioning: Upright in bed Baseline Vocal Quality: Clear Volitional Cough: Strong    Oral/Motor/Sensory Function Overall  Oral Motor/Sensory Function: Appears within functional limits for tasks assessed   Ice Chips     Thin Liquid Thin Liquid: Within functional limits    Nectar Thick     Honey Thick     Puree     Solid   GO    Solid: Within functional limits Presentation: Spoon;Self Fed        Eden Emms 07/22/2014,9:12 AM

## 2014-07-22 NOTE — Progress Notes (Signed)
PULMONARY / CRITICAL CARE MEDICINE HISTORY AND PHYSICAL EXAMINATION   Name: Cory Marks MRN: [id number] DOB: 10-Sep-1960    ADMISSION DATE:  07/20/2014  PRIMARY SERVICE: PCCM  CHIEF COMPLAINT:  Altered Mental status  BRIEF PATIENT DESCRIPTION: 53 y/o man with MMP, including substance abuse and R toe ucler who remains narcan dependent after presenting with AMS and reporting taking narcotics for his toe pain.  SIGNIFICANT EVENTS / STUDIES:  Narcan x3  LINES / TUBES: PIV x3   CULTURES: Blood x2 - 07/21/14  ANTIBIOTICS: Acyclovir (home med) Pip-tazo - 07/21/14 Vanc - 07/21/14  SUBJECTIVE:  Narcan off and patient much more awake  VITAL SIGNS: Temp:  [97.8 F (36.6 C)-98.4 F (36.9 C)] 98.4 F (36.9 C) (12/15 0800) Resp:  [9-26] 26 (12/15 0800) BP: (106-196)/(68-137) 133/106 mmHg (12/15 0800) SpO2:  [95 %-100 %] 97 % (12/15 0800) Weight:  [105.3 kg (232 lb 2.3 oz)] 105.3 kg (232 lb 2.3 oz) (12/15 0356) HEMODYNAMICS:   VENTILATOR SETTINGS:   INTAKE / OUTPUT: Intake/Output      12/14 0701 - 12/15 0700 12/15 0701 - 12/16 0700   I.V. (mL/kg) 454.3 (4.3)    IV Piggyback 700    Total Intake(mL/kg) 1154.3 (11)    Urine (mL/kg/hr) 1000 (0.4)    Total Output 1000     Net +154.3            PHYSICAL EXAMINATION: General:  Middle aged man, comfortable and eating breakfast Neuro:  Awake, oriented, non-focal HEENT:  MMM Neck: No JVD Cardiovascular:  RRR Lungs:  CTAB Abdomen:  Soft, non-tender Musculoskeletal:  Ulcer on lateral R great toe, tender, no discharge Skin:  No rashes  LABS:  CBC  Recent Labs Lab 07/20/14 2243 07/21/14 0404 07/22/14 0344  WBC 7.1 8.0 4.0  HGB 14.2 13.0 13.2  HCT 43.3 39.7 41.2  PLT 171 157 162   Coag's No results for input(s): APTT, INR in the last 168 hours. BMET  Recent Labs Lab 07/20/14 2243 07/21/14 0404 07/22/14 0344  NA 136*  --  143  K 4.4  --  4.3  CL 95*  --  104  CO2 25  --  28  BUN 30*  --  20  CREATININE  2.82* 2.41* 1.88*  GLUCOSE 128*  --  82   Electrolytes  Recent Labs Lab 07/20/14 2243 07/22/14 0344  CALCIUM 10.1 9.3  MG  --  1.8  PHOS  --  2.7   Sepsis Markers  Recent Labs Lab 07/20/14 2251  LATICACIDVEN 1.93   ABG  Recent Labs Lab 07/21/14 0105 07/21/14 0211  PHART 7.205* 7.297*  PCO2ART 71.9* 49.3*  PO2ART 249.0* 61.0*   Liver Enzymes  Recent Labs Lab 07/20/14 2243  AST 33  ALT 23  ALKPHOS 38*  BILITOT 0.6  ALBUMIN 4.3   Cardiac Enzymes No results for input(s): TROPONINI, PROBNP in the last 168 hours. Glucose  Recent Labs Lab 07/21/14 0607 07/21/14 0735 07/21/14 1139 07/21/14 1928 07/21/14 2359 07/22/14 0350  GLUCAP 95 94 78 122* 116* 75    Imaging Dg Chest 1 View  07/21/2014   CLINICAL DATA:  Shortness of breath  EXAM: CHEST - 1 VIEW  COMPARISON:  11/29/2009  FINDINGS: Right IJ porta catheter, tip at the SVC level. There is borderline cardiomegaly, accentuated by technique. Negative aortic and hilar contours. Linear opacity at the left base favors scarring or atelectasis based on morphology. No edema, effusion, or pneumothorax.  Remote right ninth rib fracture.  IMPRESSION: No  edema or definitive pneumonia.   Electronically Signed   By: Jorje Guild M.D.   On: 07/21/2014 00:11   Ct Head Wo Contrast  07/21/2014   CLINICAL DATA:  Found in garage by family, unresponsive. Somnolent. History of non-Hodgkin's lymphoma.  EXAM: CT HEAD WITHOUT CONTRAST  TECHNIQUE: Contiguous axial images were obtained from the base of the skull through the vertex without intravenous contrast.  COMPARISON:  CT of the head December 01, 2010  FINDINGS: No intraparenchymal hemorrhage, mass effect, midline shift. Patchy to confluent supratentorial white matter hypodensities, advanced from prior imaging and advanced for age. Subcentimeter RIGHT thalamus lacunar infarct is new from prior imaging. No acute large vascular territory infarct. Dolicoectatic appearance of the  intracranial vessels. No acute large vascular territory infarct.  No abnormal extra-axial fluid collection. Paranasal sinuses and mastoid air cells appear well-aerated. No skull fracture.  IMPRESSION: Age indeterminate RIGHT thalamus lacunar infarct, new from prior imaging.  Moderate to severe white matter changes, advanced for age and from prior imaging. Dolichoectatic intracranial vessels suggests chronic hypertension.   Electronically Signed   By: Elon Alas   On: 07/21/2014 00:00   Dg Foot Complete Right  07/21/2014   CLINICAL DATA:  Pain in the great toe.  No known injury.  EXAM: RIGHT FOOT COMPLETE - 3+ VIEW  COMPARISON:  01/24/2014  FINDINGS: Soft tissue gas in the medial great toe compatible with ulcer. No radiopaque foreign body. There is no acute erosion or acute periosteal reaction of the underlying great toe phalanges to suggest acute osteomyelitis (periarticular lucency/erosive changes around the great toe interphalangeal joint are stable). There are chronic erosive changes to the second distal and middle phalanges. No acute fracture or malalignment.  IMPRESSION: 1. Great toe soft tissue ulcer. 2. Stable appearance of the phalanges; no evidence of acute osteomyelitis.   Electronically Signed   By: Jorje Guild M.D.   On: 07/21/2014 00:16    EKG: NSR CXR: Enlarged cardiac silhouette, low long volumes, possible small left effusion vs atelectasis  ASSESSMENT / PLAN:  Active Problems:   HTN (hypertension)   Diabetes mellitus type 2, controlled, with complications   Foot ulcer, right   Hypercarbia   PULMONARY A: Hypercarbic Respiratory failure due to opiate effect, improved P:   Resolving w/ narcan administration.  pulm hygiene  CARDIOVASCULAR A: HTN Cocaine use P:   Continue home amlodipine, lisinopril Cautious monitoring of hemodynamics.  RENAL A: CKD IV P:   Renally dose medications. Avoid nephrotoxic agents  GASTROINTESTINAL A: No active issues P:    PO diet  HEMATOLOGIC A: Asplenic Hx of mantle cell lymphoma / auto-SCT P:   Continue PCN ppx (home med).  INFECTIOUS A: Diabetic foot ulcer infection P:   Appreciate wound care consult Will d/c IV abx 12/15, change back to home PCN,  and plan for him to follow in wound care clinic as an outpt  ENDOCRINE A: Diabetes type 2, uncontrolled P:   Change to SSI protocol  NEUROLOGIC A: Encephalopathy due to opiate overdose, resolved P:   Will need to adjust narcs to manage his pain, avoid complications   BEST PRACTICE / DISPOSITION Level of Care:  ICU >> med-surg Primary Service:  PCCM Consultants:  None Code Status:  Full Diet:  PO diet DVT Px:  Heparin GI Px:  None indicated Skin Integrity:  Intact, save for toe Social / Family:  Patient has capacity  TODAY'S SUMMARY: 53 y/o man with polysubstance abuse p/w pain, opiate overdose, and need  for narcan infusion. Now resolved. Transfer to floor. If he tolerates the transition back to PO abx then should be able to d/c home on 12/16   Baltazar Apo, MD, PhD 07/22/2014, 9:20 AM [place] [facility] [phone] or if no answer [phone]

## 2014-07-23 DIAGNOSIS — J9602 Acute respiratory failure with hypercapnia: Secondary | ICD-10-CM

## 2014-07-23 DIAGNOSIS — G9340 Encephalopathy, unspecified: Secondary | ICD-10-CM

## 2014-07-23 LAB — GLUCOSE, CAPILLARY: Glucose-Capillary: 99 mg/dL (ref 70–99)

## 2014-07-23 NOTE — Discharge Summary (Signed)
Physician Discharge Summary  Patient ID: Cory Marks MRN: [id number] DOB/AGE: [birth year] 54 y.o.  Admit date: 07/20/2014 Discharge date: 07/23/2014  Problem List Active Problems:   HTN (hypertension)   Diabetes mellitus type 2, controlled, with complications   Foot ulcer, right   Hypercarbia  HPI: Cory Marks is a 53 y/o man with a complex PMHx notable for prior mantle cell lymphoma in 2012 for which he ultimately underwent autologous SCT at [facility] as well polysubstance abuse, CKD, and splenic rupture which required splenectomy. He has developed a painful ulcer on his right great toe and was seeking relief for this, including morphine (from father's prescription), as well as recreational cocaine, moonshine, and other drugs. He became more somnolent and was brought to the ED where he was found to have a respiratory acidosis. He was given narcan and responded well. He again became somnolent and again responded to narcan. He ultimately required a narcan drip and PCCM was called for admission. Hospital Course:   SIGNIFICANT EVENTS / STUDIES:  Narcan x3  LINES / TUBES: PIV x3  CULTURES: Blood x2 - 07/21/14  ANTIBIOTICS: Acyclovir (home med) Pip-tazo - 07/21/14 Vanc - 07/21/14  SUBJECTIVE:  Narcan off and patient much more awake   EKG: NSR CXR: Enlarged cardiac silhouette, low long volumes, possible small left effusion vs atelectasis  ASSESSMENT / PLAN:  Active Problems:  HTN (hypertension)  Diabetes mellitus type 2, controlled, with complications  Foot ulcer, right  Hypercarbia   PULMONARY A: Hypercarbic Respiratory failure due to opiate effect, improved(resovled) P:  Resolving w/ narcan administration.  pulm hygiene  CARDIOVASCULAR A: HTN Cocaine use P:  Continue home amlodipine. Cautious monitoring of hemodynamics.  RENAL Lab Results  Component Value Date   CREATININE 1.88* 07/22/2014   CREATININE 2.41* 07/21/2014   CREATININE 2.82*  07/20/2014   CREATININE 1.81* 05/30/2014   CREATININE 1.70* 01/17/2014    A: CKD IV P:  Renally dose medications. Avoid nephrotoxic agents. Dc lisinopril 12/16.   HEMATOLOGIC A: Asplenic Hx of mantle cell lymphoma / auto-SCT P:  Continue PCN ppx (home med).  INFECTIOUS A: Diabetic foot ulcer infection P:  Appreciate wound care consult Will d/c IV abx 12/15, change back to home PCN, and plan for him to follow in wound care clinic as an outpt  ENDOCRINE CBG (last 3)   Recent Labs  07/22/14 0350 07/22/14 1650 07/22/14 2125  GLUCAP 75 95 120*     A: Diabetes type 2, uncontrolled P:  Change to SSI protocol  NEUROLOGIC A: Encephalopathy due to opiate overdose, resolved P:  Take only medications as prescribed. Substance abuse counselling  Labs at discharge Lab Results  Component Value Date   CREATININE 1.88* 07/22/2014   BUN 20 07/22/2014   NA 143 07/22/2014   K 4.3 07/22/2014   CL 104 07/22/2014   CO2 28 07/22/2014   Lab Results  Component Value Date   WBC 4.0 07/22/2014   HGB 13.2 07/22/2014   HCT 41.2 07/22/2014   MCV 98.1 07/22/2014   PLT 162 07/22/2014   Lab Results  Component Value Date   ALT 23 07/20/2014   AST 33 07/20/2014   ALKPHOS 38* 07/20/2014   BILITOT 0.6 07/20/2014   Lab Results  Component Value Date   INR 1.16 11/30/2010    Current radiology studies No results found.  Disposition:  01-Home or Self Care  Discharge Instructions    Discharge patient    Complete by:  As directed  Medication List    STOP taking these medications        lisinopril 10 MG tablet  Commonly known as:  PRINIVIL,ZESTRIL      TAKE these medications        amLODipine 10 MG tablet  Commonly known as:  NORVASC  Take 10 mg by mouth every morning.     cetirizine 10 MG tablet  Commonly known as:  ZYRTEC  Take 10 mg by mouth daily as needed for allergies.     docusate sodium 100 MG capsule  Commonly known as:   COLACE  Take 100 mg by mouth daily as needed for constipation.     gabapentin 300 MG capsule  Commonly known as:  NEURONTIN  Take 1 capsule (300 mg total) by mouth 3 (three) times daily.     OVER THE COUNTER MEDICATION  Take 1 capsule by mouth daily as needed (erectile dysfunction). X AGAIN     penicillin v potassium 500 MG tablet  Commonly known as:  VEETID  Take 500 mg by mouth 2 (two) times daily.     pyridOXINE 100 MG tablet  Commonly known as:  VITAMIN B-6  Take 100 mg by mouth every morning.     rosuvastatin 20 MG tablet  Commonly known as:  CRESTOR  Take 1 tablet (20 mg total) by mouth daily.           Follow-up Information    Follow up with MATTHEWS,MICHELLE A., MD On 08/15/2014.   Specialty:  Internal Medicine   Why:  4:30 pm with Dr. Zigmund Daniel.   Contact information:   [place] [phone]        Discharged Condition: good  Time spent on discharge greater than 40 minutes.  Vital signs at Discharge. Temp:  [97.9 F (36.6 C)-98.7 F (37.1 C)] 98.4 F (36.9 C) (12/16 0801) Pulse Rate:  [51-59] 55 (12/16 0801) Resp:  [11-22] 19 (12/16 0801) BP: (129-158)/(96-106) 147/100 mmHg (12/16 0801) SpO2:  [93 %-98 %] 98 % (12/16 0801) Weight:  [227 lb (102.967 kg)] 227 lb (102.967 kg) (12/15 2128) Office follow up Special Information or instructions. No need to follow up with Pulmonary, an appointment has been made with Dr. Zigmund Daniel on 08/15/14/4:30 pm. Signed: Richardson Landry Minor ACNP Maryanna Shape PCCM Pager [phone] till 3 pm If no answer page [phone]   Physician Statement:   The Patient was personally examined, the discharge assessment and plan has been personally reviewed and I agree with ACNP Minor's assessment and plan. > 30 minutes of time have been dedicated to discharge assessment, planning and discharge instructions.  Rigoberto Noel MD   07/23/2014, 9:30 AM

## 2014-07-24 LAB — GLUCOSE, CAPILLARY: Glucose-Capillary: 90 mg/dL (ref 70–99)

## 2014-07-27 LAB — CULTURE, BLOOD (ROUTINE X 2)
Culture: NO GROWTH
Culture: NO GROWTH

## 2014-08-10 ENCOUNTER — Emergency Department (HOSPITAL_COMMUNITY): Payer: Medicare HMO

## 2014-08-10 ENCOUNTER — Emergency Department (HOSPITAL_COMMUNITY)
Admission: EM | Admit: 2014-08-10 | Discharge: 2014-08-10 | Disposition: A | Discharge status: Home / Self Care | Payer: Medicare HMO | Attending: Emergency Medicine | Admitting: Emergency Medicine

## 2014-08-10 ENCOUNTER — Encounter (HOSPITAL_COMMUNITY): Payer: Self-pay | Admitting: *Deleted

## 2014-08-10 DIAGNOSIS — S8002XA Contusion of left knee, initial encounter: Secondary | ICD-10-CM | POA: Diagnosis not present

## 2014-08-10 DIAGNOSIS — Z79899 Other long term (current) drug therapy: Secondary | ICD-10-CM | POA: Insufficient documentation

## 2014-08-10 DIAGNOSIS — Y998 Other external cause status: Secondary | ICD-10-CM | POA: Insufficient documentation

## 2014-08-10 DIAGNOSIS — E119 Type 2 diabetes mellitus without complications: Secondary | ICD-10-CM | POA: Insufficient documentation

## 2014-08-10 DIAGNOSIS — Y93E1 Activity, personal bathing and showering: Secondary | ICD-10-CM | POA: Insufficient documentation

## 2014-08-10 DIAGNOSIS — W010XXA Fall on same level from slipping, tripping and stumbling without subsequent striking against object, initial encounter: Secondary | ICD-10-CM | POA: Insufficient documentation

## 2014-08-10 DIAGNOSIS — Z85828 Personal history of other malignant neoplasm of skin: Secondary | ICD-10-CM | POA: Diagnosis not present

## 2014-08-10 DIAGNOSIS — Z8669 Personal history of other diseases of the nervous system and sense organs: Secondary | ICD-10-CM | POA: Diagnosis not present

## 2014-08-10 DIAGNOSIS — Z9484 Stem cells transplant status: Secondary | ICD-10-CM | POA: Diagnosis not present

## 2014-08-10 DIAGNOSIS — I10 Essential (primary) hypertension: Secondary | ICD-10-CM | POA: Insufficient documentation

## 2014-08-10 DIAGNOSIS — Z792 Long term (current) use of antibiotics: Secondary | ICD-10-CM | POA: Insufficient documentation

## 2014-08-10 DIAGNOSIS — M25562 Pain in left knee: Secondary | ICD-10-CM

## 2014-08-10 DIAGNOSIS — Y92002 Bathroom of unspecified non-institutional (private) residence single-family (private) house as the place of occurrence of the external cause: Secondary | ICD-10-CM | POA: Insufficient documentation

## 2014-08-10 DIAGNOSIS — Z9081 Acquired absence of spleen: Secondary | ICD-10-CM | POA: Insufficient documentation

## 2014-08-10 DIAGNOSIS — S8992XA Unspecified injury of left lower leg, initial encounter: Secondary | ICD-10-CM | POA: Diagnosis present

## 2014-08-10 MED ORDER — IBUPROFEN 800 MG PO TABS: 800.0000 mg | ORAL_TABLET | Freq: Three times a day (TID) | ORAL | Status: DC | PRN

## 2014-08-10 MED ORDER — HYDROCODONE-ACETAMINOPHEN 5-325 MG PO TABS
1.0000 | ORAL_TABLET | Freq: Four times a day (QID) | ORAL | Status: DC | PRN
Start: 2014-08-10 — End: 2014-10-19

## 2014-08-10 NOTE — ED Notes (Signed)
Pt reports falling last night and now has pain to left knee. Ambulatory at triage.

## 2014-08-10 NOTE — Discharge Instructions (Signed)
Return here as needed.  The x-rays did not show any significant abnormalities other than arthritis.  Follow-up with the orthopedist provided.  Ice and elevate your knee

## 2014-08-10 NOTE — ED Provider Notes (Signed)
CSN: [id number]     Arrival date & time 08/10/14  1148 History  This chart was scribed for non-physician practitioner, Irena Cords, PA-C, working with Blanchie Dessert, MD, by Stephania Fragmin, ED Scribe. This patient was seen in room TR10C/TR10C and the patient's care was started at 12:50 PM.    Chief Complaint  Patient presents with  . Knee Pain   The history is provided by the patient. No language interpreter was used.    HPI Comments: Cory Marks is a 54 y.o. male who presents to the Emergency Department complaining of right knee pain S/P a fall that occurred earlier today when patient tripped when leaving the bathroom.  Patient, states he is having pain when he stands and moves his knee. Patient states he has not seen an orthopedist in several years.  Past Medical History  Diagnosis Date  . Hypertension   . Diabetes mellitus   . Bell's palsy   . Cancer     Mantle Cell Cancer- S/P stem cell transplant  . History of splenectomy   . Polysubstance abuse    Past Surgical History  Procedure Laterality Date  . Splenectomy, total    . Edentulation N/A y-2    prior to transplant   Family History  Problem Relation Age of Onset  . Hypertension Mother   . Diabetes Mother   . Cancer Father   . Diabetes Father   . Heart disease Father   . Diabetes Sister    History  Substance Use Topics  . Smoking status: Never Smoker   . Smokeless tobacco: Not on file  . Alcohol Use: 0.6 oz/week    1 Cans of beer per week     Comment: occ    Review of Systems  Musculoskeletal: Positive for myalgias.  All other systems reviewed and are negative.     Allergies  Oxycodone  Home Medications   Prior to Admission medications   Medication Sig Start Date End Date Taking? Authorizing Provider  amLODipine (NORVASC) 10 MG tablet Take 10 mg by mouth every morning.    Historical Provider, MD  cetirizine (ZYRTEC) 10 MG tablet Take 10 mg by mouth daily as needed for allergies.    Historical  Provider, MD  docusate sodium (COLACE) 100 MG capsule Take 100 mg by mouth daily as needed for constipation.    Historical Provider, MD  gabapentin (NEURONTIN) 300 MG capsule Take 1 capsule (300 mg total) by mouth 3 (three) times daily. Patient taking differently: Take 600 mg by mouth 3 (three) times daily.  05/05/14   Leana Gamer, MD  OVER THE COUNTER MEDICATION Take 1 capsule by mouth daily as needed (erectile dysfunction). X AGAIN    Historical Provider, MD  penicillin v potassium (VEETID) 500 MG tablet Take 500 mg by mouth 2 (two) times daily.    Historical Provider, MD  pyridOXINE (VITAMIN B-6) 100 MG tablet Take 100 mg by mouth every morning.     Historical Provider, MD  rosuvastatin (CRESTOR) 20 MG tablet Take 1 tablet (20 mg total) by mouth daily. 01/31/14   Leana Gamer, MD   BP 109/75 mmHg  Pulse 65  Temp(Src) 98.1 F (36.7 C) (Oral)  Resp 14  SpO2 100% Physical Exam  Constitutional: He is oriented to person, place, and time. He appears well-developed and well-nourished. No distress.  HENT:  Head: Normocephalic and atraumatic.  Eyes: Pupils are equal, round, and reactive to light.  Cardiovascular: Normal rate.   Pulmonary/Chest: Effort normal. No  respiratory distress.  Musculoskeletal: Normal range of motion.       Left knee: He exhibits normal range of motion, no swelling, no effusion, no ecchymosis, no erythema, no bony tenderness and normal meniscus. No tenderness found.  Neurological: He is alert and oriented to person, place, and time.  Skin: Skin is warm and dry. No rash noted. No erythema.  Psychiatric: He has a normal mood and affect. His behavior is normal.  Nursing note and vitals reviewed.   ED Course  Procedures (including critical care time)  DIAGNOSTIC STUDIES: Oxygen Saturation is 100% on room air, normal by my interpretation.    COORDINATION OF CARE: 12:50 PM - Discussed treatment plan with pt at bedside which includes knee XR and ortho  referral and pt agreed to plan.   Imaging Review Dg Knee Complete 4 Views Left  08/10/2014   CLINICAL DATA:  Slip out of shower and fall onto left knee. Initial encounter.  EXAM: LEFT KNEE - COMPLETE 4+ VIEW  COMPARISON:  None.  FINDINGS: No acute fracture, dislocation or joint effusion is identified. There is moderate lateral joint space narrowing and tricompartmental proliferative changes. No bony lesions or destruction. Soft tissues are unremarkable.  IMPRESSION: No acute fracture. Tricompartmental degenerative disease with moderate lateral joint space narrowing.   Electronically Signed   By: Aletta Edouard M.D.   On: 08/10/2014 13:39    She will be treated for contusion of the knee along with arthritic changes, will refer him to orthopedics and given pain control told to return here as needed.  Told ice and elevate the knee       Brent General, PA-C 08/10/14 1347  Blanchie Dessert, MD 08/11/14 1513

## 2014-08-15 ENCOUNTER — Ambulatory Visit: Payer: Medicare HMO | Admitting: Family Medicine

## 2014-08-25 ENCOUNTER — Other Ambulatory Visit: Payer: Medicare HMO

## 2014-09-01 ENCOUNTER — Ambulatory Visit: Payer: Medicare HMO | Admitting: Family Medicine

## 2014-09-05 ENCOUNTER — Other Ambulatory Visit: Payer: Self-pay | Admitting: Internal Medicine

## 2014-09-05 ENCOUNTER — Ambulatory Visit (HOSPITAL_COMMUNITY)
Admission: RE | Admit: 2014-09-05 | Discharge: 2014-09-05 | Disposition: A | Discharge status: Home / Self Care | Payer: Medicare HMO | Source: Ambulatory Visit | Attending: Internal Medicine | Admitting: Internal Medicine

## 2014-09-05 ENCOUNTER — Other Ambulatory Visit: Payer: Self-pay | Admitting: Family Medicine

## 2014-09-05 ENCOUNTER — Encounter (HOSPITAL_BASED_OUTPATIENT_CLINIC_OR_DEPARTMENT_OTHER): Payer: Medicare HMO | Attending: Internal Medicine

## 2014-09-05 ENCOUNTER — Other Ambulatory Visit: Payer: Medicare HMO

## 2014-09-05 DIAGNOSIS — L03031 Cellulitis of right toe: Secondary | ICD-10-CM | POA: Insufficient documentation

## 2014-09-05 DIAGNOSIS — E118 Type 2 diabetes mellitus with unspecified complications: Secondary | ICD-10-CM

## 2014-09-05 DIAGNOSIS — E119 Type 2 diabetes mellitus without complications: Secondary | ICD-10-CM | POA: Diagnosis not present

## 2014-09-05 DIAGNOSIS — G629 Polyneuropathy, unspecified: Secondary | ICD-10-CM | POA: Insufficient documentation

## 2014-09-05 DIAGNOSIS — I10 Essential (primary) hypertension: Secondary | ICD-10-CM

## 2014-09-05 DIAGNOSIS — M869 Osteomyelitis, unspecified: Secondary | ICD-10-CM

## 2014-09-05 DIAGNOSIS — L97511 Non-pressure chronic ulcer of other part of right foot limited to breakdown of skin: Secondary | ICD-10-CM | POA: Diagnosis not present

## 2014-09-05 DIAGNOSIS — L97519 Non-pressure chronic ulcer of other part of right foot with unspecified severity: Secondary | ICD-10-CM | POA: Diagnosis present

## 2014-09-05 DIAGNOSIS — E8881 Metabolic syndrome: Secondary | ICD-10-CM

## 2014-09-05 LAB — CBC WITH DIFFERENTIAL/PLATELET
Basophils Absolute: 0.1 10*3/uL (ref 0.0–0.1)
Basophils Relative: 1 % (ref 0–1)
EOS ABS: 0.1 10*3/uL (ref 0.0–0.7)
Eosinophils Relative: 2 % (ref 0–5)
HEMATOCRIT: 45.1 % (ref 39.0–52.0)
Hemoglobin: 15.1 g/dL (ref 13.0–17.0)
Lymphocytes Relative: 48 % — ABNORMAL HIGH (ref 12–46)
Lymphs Abs: 2.5 10*3/uL (ref 0.7–4.0)
MCH: 32.4 pg (ref 26.0–34.0)
MCHC: 33.5 g/dL (ref 30.0–36.0)
MCV: 96.8 fL (ref 78.0–100.0)
MONO ABS: 0.4 10*3/uL (ref 0.1–1.0)
MONOS PCT: 7 % (ref 3–12)
MPV: 10.2 fL (ref 8.6–12.4)
NEUTROS ABS: 2.2 10*3/uL (ref 1.7–7.7)
Neutrophils Relative %: 42 % — ABNORMAL LOW (ref 43–77)
Platelets: 211 10*3/uL (ref 150–400)
RBC: 4.66 MIL/uL (ref 4.22–5.81)
RDW: 14.2 % (ref 11.5–15.5)
WBC: 5.2 10*3/uL (ref 4.0–10.5)

## 2014-09-05 LAB — LIPID PANEL
Cholesterol: 264 mg/dL — ABNORMAL HIGH (ref 0–200)
HDL: 60 mg/dL (ref >39)
LDL Cholesterol: 178 mg/dL — ABNORMAL HIGH (ref 0–99)
Total CHOL/HDL Ratio: 4.4 Ratio
Triglycerides: 130 mg/dL (ref <150)
VLDL: 26 mg/dL (ref 0–40)

## 2014-09-05 LAB — COMPLETE METABOLIC PANEL WITH GFR
ALK PHOS: 43 U/L (ref 39–117)
ALT: 16 U/L (ref 0–53)
AST: 17 U/L (ref 0–37)
Albumin: 4.3 g/dL (ref 3.5–5.2)
BUN: 16 mg/dL (ref 6–23)
CALCIUM: 9.4 mg/dL (ref 8.4–10.5)
CHLORIDE: 100 meq/L (ref 96–112)
CO2: 26 mEq/L (ref 19–32)
Creat: 1.6 mg/dL — ABNORMAL HIGH (ref 0.50–1.35)
GFR, EST AFRICAN AMERICAN: 56 mL/min — AB
GFR, EST NON AFRICAN AMERICAN: 48 mL/min — AB
GLUCOSE: 75 mg/dL (ref 70–99)
Potassium: 4.8 mEq/L (ref 3.5–5.3)
Sodium: 139 mEq/L (ref 135–145)
TOTAL PROTEIN: 6.3 g/dL (ref 6.0–8.3)
Total Bilirubin: 0.6 mg/dL (ref 0.2–1.2)

## 2014-09-06 LAB — HEMOGLOBIN A1C
Hgb A1c MFr Bld: 6 % — ABNORMAL HIGH (ref <5.7)
MEAN PLASMA GLUCOSE: 126 mg/dL — AB (ref <117)

## 2014-09-06 NOTE — Progress Notes (Signed)
[facility]  NAME:  TYREE, FLUHARTY NO.:  [id number]  MEDICAL RECORD NO.:  [id number]      DATE OF BIRTH:  [birth year]  PHYSICIAN:  Ricard Dillon, M.D.      VISIT DATE:                                  OFFICE VISIT   CHIEF COMPLAINT:  Review of wound on his right plantar first toe.  This is a 54 year old man who has some form of peripheral nerve damage which may have been secondary to an injury in his left leg which occured  remotely.  He also has a history of mantle cell lymphoma, and has had previous chemotherapy.  We had seen him previously in this clinic through the fall of 2014 up until midway through 2015 for a wound on his plantar right great toe.  This eventually healed over with use of total contact cast (EZCast).  The patient tells me that this actually reopened in November.  Since then, he has simply been wrapping it in gauze.  He has noted some increasing tenderness and nonhealing and he in here to see [place] today for this tissue.  PAST MEDICAL HISTORY:  Includes hypertension, some form of peripheral neuropathy, mantle cell lymphoma and hyperlipidemia.  CURRENT MEDICATIONS:  Include Norvasc 10 daily, Zyrtec 10 daily p.r.n., Colace 100 daily p.r.n., Neurontin 300 t.i.d., PenVee 500 b.i.d. (previous splenectomy for the mantle cell lymphoma), vitamin B6 100 daily and Crestor 20 daily.  EXAMINATION:  VITAL SIGNS:  Temperature is 98.5, pulse 54, respirations 20, blood pressure is 143/101. VASCULAR ASSESSMENT:  His ABI on the right is 1, peripheral pulse is palpable.  There is no bedside evidence of arterial insufficiency.  Wound exam, the area in question is on the plantar right great toe. This measures 1.3 x 0.9 x 0.6.  There is surrounding callus swelling and a fair amount of tenderness.  The base of the actual open area did not appear overly concerning, however, there may be some probing to this at 4 o'clock.  The deep  culture of this area was done.  There is evidence here to suspect at least a soft tissue infection.  IMPRESSIONS: 1. Neuropathic ulcer on the right great toe as described. 2. Surrounding cellulitis seems quite likely. We have done a culture and sensitivity of this area.  He was prescribed doxycycline.  The area, we dressed with silver alginate.  An x-ray of the underlying bone to exclude osteomyelitis was ordered.  The patient has healed well last time with use of an EZCast, however, right now with the infection concerns this would be relatively contraindicated.  We provided him with a healing sandal with off- loading.  I believe he is in some in between states for his job therefore he is moving around with work boots on.  I cautioned him to use the healing sandal when he is able.          ______________________________ Ricard Dillon, M.D.     MGR/MEDQ  D:  09/05/2014  T:  09/06/2014  Job:  [id number]

## 2014-09-12 ENCOUNTER — Encounter (HOSPITAL_BASED_OUTPATIENT_CLINIC_OR_DEPARTMENT_OTHER): Payer: Medicare HMO | Attending: Internal Medicine

## 2014-09-12 DIAGNOSIS — S91102D Unspecified open wound of left great toe without damage to nail, subsequent encounter: Secondary | ICD-10-CM | POA: Diagnosis present

## 2014-09-12 DIAGNOSIS — G629 Polyneuropathy, unspecified: Secondary | ICD-10-CM | POA: Diagnosis not present

## 2014-09-12 DIAGNOSIS — X58XXXD Exposure to other specified factors, subsequent encounter: Secondary | ICD-10-CM | POA: Insufficient documentation

## 2014-09-12 DIAGNOSIS — L97523 Non-pressure chronic ulcer of other part of left foot with necrosis of muscle: Secondary | ICD-10-CM | POA: Diagnosis not present

## 2014-09-12 DIAGNOSIS — B9561 Methicillin susceptible Staphylococcus aureus infection as the cause of diseases classified elsewhere: Secondary | ICD-10-CM | POA: Diagnosis not present

## 2014-09-19 ENCOUNTER — Other Ambulatory Visit: Payer: Self-pay | Admitting: Internal Medicine

## 2014-09-19 DIAGNOSIS — S91102D Unspecified open wound of left great toe without damage to nail, subsequent encounter: Secondary | ICD-10-CM | POA: Diagnosis not present

## 2014-09-19 DIAGNOSIS — B9561 Methicillin susceptible Staphylococcus aureus infection as the cause of diseases classified elsewhere: Secondary | ICD-10-CM | POA: Diagnosis not present

## 2014-09-19 DIAGNOSIS — M19072 Primary osteoarthritis, left ankle and foot: Secondary | ICD-10-CM

## 2014-09-19 DIAGNOSIS — L97321 Non-pressure chronic ulcer of left ankle limited to breakdown of skin: Secondary | ICD-10-CM

## 2014-09-19 DIAGNOSIS — G629 Polyneuropathy, unspecified: Secondary | ICD-10-CM | POA: Diagnosis not present

## 2014-09-19 DIAGNOSIS — L97523 Non-pressure chronic ulcer of other part of left foot with necrosis of muscle: Secondary | ICD-10-CM | POA: Diagnosis not present

## 2014-09-23 ENCOUNTER — Telehealth: Payer: Self-pay | Admitting: Internal Medicine

## 2014-09-23 NOTE — Telephone Encounter (Signed)
Called patient to schedule annual Medicare Wellness visit. Telephone number not in service.

## 2014-09-26 DIAGNOSIS — L97523 Non-pressure chronic ulcer of other part of left foot with necrosis of muscle: Secondary | ICD-10-CM | POA: Diagnosis not present

## 2014-09-26 DIAGNOSIS — S91102D Unspecified open wound of left great toe without damage to nail, subsequent encounter: Secondary | ICD-10-CM | POA: Diagnosis not present

## 2014-09-26 DIAGNOSIS — B9561 Methicillin susceptible Staphylococcus aureus infection as the cause of diseases classified elsewhere: Secondary | ICD-10-CM | POA: Diagnosis not present

## 2014-09-26 DIAGNOSIS — G629 Polyneuropathy, unspecified: Secondary | ICD-10-CM | POA: Diagnosis not present

## 2014-09-29 ENCOUNTER — Ambulatory Visit (HOSPITAL_COMMUNITY)
Admission: RE | Admit: 2014-09-29 | Discharge: 2014-09-29 | Disposition: A | Discharge status: Home / Self Care | Payer: Medicare HMO | Source: Ambulatory Visit | Attending: Internal Medicine | Admitting: Internal Medicine

## 2014-09-29 DIAGNOSIS — G629 Polyneuropathy, unspecified: Secondary | ICD-10-CM | POA: Insufficient documentation

## 2014-09-29 DIAGNOSIS — M869 Osteomyelitis, unspecified: Secondary | ICD-10-CM | POA: Insufficient documentation

## 2014-09-29 DIAGNOSIS — L97321 Non-pressure chronic ulcer of left ankle limited to breakdown of skin: Secondary | ICD-10-CM

## 2014-09-29 DIAGNOSIS — L089 Local infection of the skin and subcutaneous tissue, unspecified: Secondary | ICD-10-CM | POA: Diagnosis present

## 2014-09-29 DIAGNOSIS — L03031 Cellulitis of right toe: Secondary | ICD-10-CM | POA: Diagnosis not present

## 2014-09-29 DIAGNOSIS — L97511 Non-pressure chronic ulcer of other part of right foot limited to breakdown of skin: Secondary | ICD-10-CM | POA: Insufficient documentation

## 2014-09-29 DIAGNOSIS — M19072 Primary osteoarthritis, left ankle and foot: Secondary | ICD-10-CM

## 2014-09-29 MED ORDER — GADOBENATE DIMEGLUMINE 529 MG/ML IV SOLN
20.0000 mL | Freq: Once | INTRAVENOUS | Status: AC | PRN
  Administered 2014-09-29: 20 mL via INTRAVENOUS

## 2014-10-03 DIAGNOSIS — S91102D Unspecified open wound of left great toe without damage to nail, subsequent encounter: Secondary | ICD-10-CM | POA: Diagnosis not present

## 2014-10-03 DIAGNOSIS — B9561 Methicillin susceptible Staphylococcus aureus infection as the cause of diseases classified elsewhere: Secondary | ICD-10-CM | POA: Diagnosis not present

## 2014-10-03 DIAGNOSIS — L97523 Non-pressure chronic ulcer of other part of left foot with necrosis of muscle: Secondary | ICD-10-CM | POA: Diagnosis not present

## 2014-10-03 DIAGNOSIS — G629 Polyneuropathy, unspecified: Secondary | ICD-10-CM | POA: Diagnosis not present

## 2014-10-10 ENCOUNTER — Encounter (HOSPITAL_BASED_OUTPATIENT_CLINIC_OR_DEPARTMENT_OTHER): Payer: Medicare HMO | Attending: Internal Medicine

## 2014-10-10 ENCOUNTER — Telehealth: Payer: Self-pay | Admitting: Internal Medicine

## 2014-10-10 DIAGNOSIS — L89893 Pressure ulcer of other site, stage 3: Secondary | ICD-10-CM | POA: Insufficient documentation

## 2014-10-10 DIAGNOSIS — G629 Polyneuropathy, unspecified: Secondary | ICD-10-CM | POA: Diagnosis not present

## 2014-10-16 ENCOUNTER — Encounter: Payer: Self-pay | Admitting: Internal Medicine

## 2014-10-16 ENCOUNTER — Ambulatory Visit (INDEPENDENT_AMBULATORY_CARE_PROVIDER_SITE_OTHER): Payer: Medicare HMO | Admitting: Internal Medicine

## 2014-10-16 VITALS — BP 121/91 | HR 64 | Temp 98.5°F | Resp 16 | Ht 72.0 in | Wt 217.0 lb

## 2014-10-16 DIAGNOSIS — I10 Essential (primary) hypertension: Secondary | ICD-10-CM

## 2014-10-16 DIAGNOSIS — R7303 Prediabetes: Secondary | ICD-10-CM

## 2014-10-16 DIAGNOSIS — R7309 Other abnormal glucose: Secondary | ICD-10-CM

## 2014-10-16 DIAGNOSIS — E785 Hyperlipidemia, unspecified: Secondary | ICD-10-CM

## 2014-10-16 MED ORDER — ROSUVASTATIN CALCIUM 20 MG PO TABS: 20.0000 mg | ORAL_TABLET | Freq: Every day | ORAL | Status: DC

## 2014-10-16 MED ORDER — AMLODIPINE BESYLATE 10 MG PO TABS: 10.0000 mg | ORAL_TABLET | Freq: Every morning | ORAL | Status: DC

## 2014-10-17 ENCOUNTER — Inpatient Hospital Stay (HOSPITAL_COMMUNITY)
Admission: EM | Admit: 2014-10-17 | Discharge: 2014-10-18 | DRG: 549 | Disposition: A | Discharge status: Home / Self Care | Payer: Medicare HMO | Attending: Internal Medicine | Admitting: Internal Medicine

## 2014-10-17 ENCOUNTER — Other Ambulatory Visit (HOSPITAL_COMMUNITY): Payer: Self-pay

## 2014-10-17 ENCOUNTER — Encounter (HOSPITAL_COMMUNITY): Payer: Self-pay | Admitting: Emergency Medicine

## 2014-10-17 DIAGNOSIS — I2510 Atherosclerotic heart disease of native coronary artery without angina pectoris: Secondary | ICD-10-CM | POA: Diagnosis present

## 2014-10-17 DIAGNOSIS — F1910 Other psychoactive substance abuse, uncomplicated: Secondary | ICD-10-CM | POA: Diagnosis present

## 2014-10-17 DIAGNOSIS — Z8572 Personal history of non-Hodgkin lymphomas: Secondary | ICD-10-CM

## 2014-10-17 DIAGNOSIS — G629 Polyneuropathy, unspecified: Secondary | ICD-10-CM | POA: Diagnosis present

## 2014-10-17 DIAGNOSIS — N183 Chronic kidney disease, stage 3 unspecified: Secondary | ICD-10-CM | POA: Diagnosis present

## 2014-10-17 DIAGNOSIS — E785 Hyperlipidemia, unspecified: Secondary | ICD-10-CM | POA: Diagnosis present

## 2014-10-17 DIAGNOSIS — G8929 Other chronic pain: Secondary | ICD-10-CM | POA: Diagnosis present

## 2014-10-17 DIAGNOSIS — L89893 Pressure ulcer of other site, stage 3: Secondary | ICD-10-CM | POA: Diagnosis present

## 2014-10-17 DIAGNOSIS — Z8679 Personal history of other diseases of the circulatory system: Secondary | ICD-10-CM

## 2014-10-17 DIAGNOSIS — Z888 Allergy status to other drugs, medicaments and biological substances status: Secondary | ICD-10-CM | POA: Diagnosis not present

## 2014-10-17 DIAGNOSIS — I483 Typical atrial flutter: Secondary | ICD-10-CM | POA: Diagnosis present

## 2014-10-17 DIAGNOSIS — M009 Pyogenic arthritis, unspecified: Secondary | ICD-10-CM

## 2014-10-17 DIAGNOSIS — I4892 Unspecified atrial flutter: Secondary | ICD-10-CM

## 2014-10-17 DIAGNOSIS — I10 Essential (primary) hypertension: Secondary | ICD-10-CM | POA: Diagnosis present

## 2014-10-17 DIAGNOSIS — M869 Osteomyelitis, unspecified: Secondary | ICD-10-CM

## 2014-10-17 DIAGNOSIS — Z87898 Personal history of other specified conditions: Secondary | ICD-10-CM

## 2014-10-17 DIAGNOSIS — Z01810 Encounter for preprocedural cardiovascular examination: Secondary | ICD-10-CM

## 2014-10-17 DIAGNOSIS — Z9081 Acquired absence of spleen: Secondary | ICD-10-CM | POA: Diagnosis present

## 2014-10-17 DIAGNOSIS — E119 Type 2 diabetes mellitus without complications: Secondary | ICD-10-CM | POA: Diagnosis present

## 2014-10-17 DIAGNOSIS — I129 Hypertensive chronic kidney disease with stage 1 through stage 4 chronic kidney disease, or unspecified chronic kidney disease: Secondary | ICD-10-CM | POA: Diagnosis present

## 2014-10-17 DIAGNOSIS — E118 Type 2 diabetes mellitus with unspecified complications: Secondary | ICD-10-CM

## 2014-10-17 DIAGNOSIS — M7989 Other specified soft tissue disorders: Secondary | ICD-10-CM | POA: Diagnosis present

## 2014-10-17 DIAGNOSIS — L97519 Non-pressure chronic ulcer of other part of right foot with unspecified severity: Secondary | ICD-10-CM | POA: Diagnosis present

## 2014-10-17 HISTORY — DX: Bradycardia, unspecified: R00.1

## 2014-10-17 HISTORY — DX: Polyneuropathy, unspecified: G62.9

## 2014-10-17 HISTORY — DX: Chronic kidney disease, stage 3 unspecified: N18.30

## 2014-10-17 HISTORY — DX: Chronic kidney disease, stage 3 (moderate): N18.3

## 2014-10-17 HISTORY — DX: Atherosclerotic heart disease of native coronary artery without angina pectoris: I25.10

## 2014-10-17 LAB — CBC WITH DIFFERENTIAL/PLATELET
BASOS ABS: 0 10*3/uL (ref 0.0–0.1)
Basophils Relative: 1 % (ref 0–1)
EOS PCT: 3 % (ref 0–5)
Eosinophils Absolute: 0.2 10*3/uL (ref 0.0–0.7)
HCT: 40.1 % (ref 39.0–52.0)
Hemoglobin: 13.4 g/dL (ref 13.0–17.0)
Lymphocytes Relative: 43 % (ref 12–46)
Lymphs Abs: 2.5 10*3/uL (ref 0.7–4.0)
MCH: 32.1 pg (ref 26.0–34.0)
MCHC: 33.4 g/dL (ref 30.0–36.0)
MCV: 96.2 fL (ref 78.0–100.0)
Monocytes Absolute: 0.4 10*3/uL (ref 0.1–1.0)
Monocytes Relative: 6 % (ref 3–12)
NEUTROS ABS: 2.7 10*3/uL (ref 1.7–7.7)
Neutrophils Relative %: 47 % (ref 43–77)
PLATELETS: 241 10*3/uL (ref 150–400)
RBC: 4.17 MIL/uL — ABNORMAL LOW (ref 4.22–5.81)
RDW: 14.3 % (ref 11.5–15.5)
WBC: 5.8 10*3/uL (ref 4.0–10.5)

## 2014-10-17 LAB — BASIC METABOLIC PANEL
Anion gap: 9 (ref 5–15)
BUN: 26 mg/dL — ABNORMAL HIGH (ref 6–23)
CALCIUM: 9.5 mg/dL (ref 8.4–10.5)
CO2: 29 mmol/L (ref 19–32)
CREATININE: 1.94 mg/dL — AB (ref 0.50–1.35)
Chloride: 102 mmol/L (ref 96–112)
GFR calc Af Amer: 43 mL/min — ABNORMAL LOW (ref >90)
GFR calc non Af Amer: 37 mL/min — ABNORMAL LOW (ref >90)
GLUCOSE: 98 mg/dL (ref 70–99)
POTASSIUM: 4.5 mmol/L (ref 3.5–5.1)
Sodium: 140 mmol/L (ref 135–145)

## 2014-10-17 LAB — TROPONIN I

## 2014-10-17 LAB — I-STAT TROPONIN, ED: TROPONIN I, POC: 0.01 ng/mL (ref 0.00–0.08)

## 2014-10-17 LAB — TSH: TSH: 1.546 u[IU]/mL (ref 0.350–4.500)

## 2014-10-17 MED ORDER — ROSUVASTATIN CALCIUM 20 MG PO TABS
20.0000 mg | ORAL_TABLET | Freq: Every day | ORAL | Status: DC
Start: 2014-10-18 — End: 2014-10-18
  Administered 2014-10-18: 20 mg via ORAL
  Filled 2014-10-17: qty 1

## 2014-10-17 MED ORDER — VANCOMYCIN HCL 10 G IV SOLR
1500.0000 mg | INTRAVENOUS | Status: AC
  Administered 2014-10-17: 1500 mg via INTRAVENOUS
  Filled 2014-10-17: qty 1500

## 2014-10-17 MED ORDER — VITAMIN B-6 100 MG PO TABS
100.0000 mg | ORAL_TABLET | Freq: Every morning | ORAL | Status: DC
  Administered 2014-10-18: 100 mg via ORAL
  Filled 2014-10-17: qty 1

## 2014-10-17 MED ORDER — POLYETHYLENE GLYCOL 3350 17 G PO PACK: 17.0000 g | PACK | Freq: Every day | ORAL | Status: DC | PRN

## 2014-10-17 MED ORDER — KETOROLAC TROMETHAMINE 30 MG/ML IJ SOLN
30.0000 mg | Freq: Once | INTRAMUSCULAR | Status: DC
  Filled 2014-10-17: qty 1

## 2014-10-17 MED ORDER — ONDANSETRON HCL 4 MG/2ML IJ SOLN: 4.0000 mg | Freq: Four times a day (QID) | INTRAMUSCULAR | Status: DC | PRN

## 2014-10-17 MED ORDER — ACETAMINOPHEN 650 MG RE SUPP: 650.0000 mg | Freq: Four times a day (QID) | RECTAL | Status: DC | PRN

## 2014-10-17 MED ORDER — BISACODYL 5 MG PO TBEC: 5.0000 mg | DELAYED_RELEASE_TABLET | Freq: Every day | ORAL | Status: DC | PRN

## 2014-10-17 MED ORDER — ACETAMINOPHEN 325 MG PO TABS: 650.0000 mg | ORAL_TABLET | Freq: Four times a day (QID) | ORAL | Status: DC | PRN

## 2014-10-17 MED ORDER — PIPERACILLIN-TAZOBACTAM 3.375 G IVPB
3.3750 g | Freq: Three times a day (TID) | INTRAVENOUS | Status: DC
  Administered 2014-10-17 – 2014-10-18 (×2): 3.375 g via INTRAVENOUS
  Filled 2014-10-17 (×4): qty 50

## 2014-10-17 MED ORDER — HEPARIN BOLUS VIA INFUSION
4000.0000 [IU] | Freq: Once | INTRAVENOUS | Status: AC
  Administered 2014-10-17: 4000 [IU] via INTRAVENOUS
  Filled 2014-10-17: qty 4000

## 2014-10-17 MED ORDER — PIPERACILLIN-TAZOBACTAM 3.375 G IVPB 30 MIN
3.3750 g | INTRAVENOUS | Status: AC
  Administered 2014-10-17: 3.375 g via INTRAVENOUS
  Filled 2014-10-17: qty 50

## 2014-10-17 MED ORDER — GABAPENTIN 300 MG PO CAPS
600.0000 mg | ORAL_CAPSULE | Freq: Two times a day (BID) | ORAL | Status: DC
  Administered 2014-10-17 – 2014-10-18 (×2): 600 mg via ORAL
  Filled 2014-10-17 (×3): qty 2

## 2014-10-17 MED ORDER — AMLODIPINE BESYLATE 10 MG PO TABS
10.0000 mg | ORAL_TABLET | Freq: Every morning | ORAL | Status: DC
  Administered 2014-10-18: 10 mg via ORAL
  Filled 2014-10-17: qty 1

## 2014-10-17 MED ORDER — SODIUM CHLORIDE 0.9 % IV SOLN
INTRAVENOUS | Status: AC
  Administered 2014-10-17: 18:00:00 via INTRAVENOUS

## 2014-10-17 MED ORDER — HYDROCODONE-ACETAMINOPHEN 5-325 MG PO TABS: 1.0000 | ORAL_TABLET | ORAL | Status: DC | PRN

## 2014-10-17 MED ORDER — HEPARIN (PORCINE) IN NACL 100-0.45 UNIT/ML-% IJ SOLN
1400.0000 [IU]/h | INTRAMUSCULAR | Status: DC
  Administered 2014-10-17 – 2014-10-18 (×2): 1400 [IU]/h via INTRAVENOUS
  Filled 2014-10-17 (×2): qty 250

## 2014-10-17 MED ORDER — ONDANSETRON HCL 4 MG PO TABS: 4.0000 mg | ORAL_TABLET | Freq: Four times a day (QID) | ORAL | Status: DC | PRN

## 2014-10-17 MED ORDER — DOCUSATE SODIUM 100 MG PO CAPS
100.0000 mg | ORAL_CAPSULE | Freq: Two times a day (BID) | ORAL | Status: DC
  Administered 2014-10-17 – 2014-10-18 (×2): 100 mg via ORAL
  Filled 2014-10-17 (×2): qty 1

## 2014-10-17 MED ORDER — VANCOMYCIN HCL 10 G IV SOLR
1250.0000 mg | Freq: Two times a day (BID) | INTRAVENOUS | Status: DC
  Administered 2014-10-18: 1250 mg via INTRAVENOUS
  Filled 2014-10-17 (×2): qty 1250

## 2014-10-17 NOTE — ED Notes (Signed)
Pt appears to be in a-flutter EKG rhythm. Asymptomatic at his time-denies SOB, dizziness, weakness. EKG shown to MD Aline Brochure. No interventions ordered at this time. Will page Hospitalist.

## 2014-10-17 NOTE — Progress Notes (Addendum)
ANTIBIOTIC CONSULT NOTE - INITIAL  Pharmacy Consult for Zosyn/Vancomycin Indication: osteomyelitis  Allergies  Allergen Reactions  . Oxycodone Itching and Palpitations    Patient Measurements:     Vital Signs: Temp: 98.2 F (36.8 C) (03/11 1011) Temp Source: Oral (03/11 1011) BP: 126/99 mmHg (03/11 1224) Pulse Rate: 45 (03/11 1224) Intake/Output from previous day:   Intake/Output from this shift:    Labs:  Recent Labs  10/17/14 1134  WBC 5.8  HGB 13.4  PLT 241  CREATININE 1.94*   Estimated Creatinine Clearance: 52.9 mL/min (by C-G formula based on Cr of 1.94). No results for input(s): VANCOTROUGH, VANCOPEAK, VANCORANDOM, GENTTROUGH, GENTPEAK, GENTRANDOM, TOBRATROUGH, TOBRAPEAK, TOBRARND, AMIKACINPEAK, AMIKACINTROU, AMIKACIN in the last 72 hours.   Microbiology: No results found for this or any previous visit (from the past 720 hour(s)).  Medical History: Past Medical History  Diagnosis Date  . Hypertension   . Diabetes mellitus   . Bell's palsy   . Cancer     Mantle Cell Cancer- S/P stem cell transplant  . History of splenectomy   . Polysubstance abuse     Assessment: 67 yoM presents with right great toe pain.  Patient recently received Keflex and Cipro for infection of neuropathic ulcer on right great toe and states that he has plans to have surgery to toe. Recent MRI showed osteomyelitis and septic joint.  Pharmacy consulted to start Vancomycin and Zosyn to cover for osteomyelitis.   Tmax: afebrile WBC: WNL Renal: CKD, SCr 1.94, CrCl~60 ml/min (CG), ~44 ml/min (normalized)  Goal of Therapy:  Vancomycin trough level 15-20 mcg/ml  Doses adjusted per renal function Eradication of infection  Plan:  1.  Vancomycin 1500 mg IV x 1 then 1250 mg IV q12h. 2.  Zosyn 3.375g IV q8h (4 hour infusion time). 3.  F/u MD assessment of wound, vanc trough, SCr.  Randolm Idol, Diva Lemberger 10/17/2014,1:09 PM

## 2014-10-17 NOTE — Progress Notes (Signed)
UR Completed.  

## 2014-10-17 NOTE — Progress Notes (Signed)
Spoke with Dr. Sheran Fava (hospitalist) about pt possibly discharging tomorrow to be with family during his father's end of life due to orthopedic surgery not occuring until Monday.  Dr. Sheran Fava stated she will decide in the morning.  Pt stable at this moment.  Heparin gtt infusing.  Vitals stable.  Pt denies pain as long as no activity occurs on R foot.  Will continue to monitor.  Iantha Fallen RN 8:03 PM 10/17/2014

## 2014-10-17 NOTE — ED Notes (Signed)
Pt c/o right big great toe pain, states he is suppose to have surgery to toe and was sent here to get process expedited. Pt has boot to right foot.

## 2014-10-17 NOTE — ED Provider Notes (Signed)
CSN: [id number]     Arrival date & time 10/17/14  1005 History   First MD Initiated Contact with Patient 10/17/14 1006     Chief Complaint  Patient presents with  . Toe Pain     (Consider location/radiation/quality/duration/timing/severity/associated sxs/prior Treatment) HPI Cory Marks is a 54 y.o. male with history of hypertension, diabetes, presents to emergency department complaining of infection to right great toe. Patient was sent here by his wound care specialist. Patient with a blister to the toe for several years, worsening in the last several months. Has recently been on Keflex and Cipro. Recent MRI showed osteomyelitis and septic joint. Symptoms have even further worsened the last week, and patient was sent here for further evaluation today. Pt denies fever, chills, malaise.    Past Medical History  Diagnosis Date  . Hypertension   . Diabetes mellitus   . Bell's palsy   . Cancer     Mantle Cell Cancer- S/P stem cell transplant  . History of splenectomy   . Polysubstance abuse    Past Surgical History  Procedure Laterality Date  . Splenectomy, total    . Edentulation N/A y-2    prior to transplant   Family History  Problem Relation Age of Onset  . Hypertension Mother   . Diabetes Mother   . Cancer Father   . Diabetes Father   . Heart disease Father   . Diabetes Sister    History  Substance Use Topics  . Smoking status: Never Smoker   . Smokeless tobacco: Not on file  . Alcohol Use: 0.6 oz/week    1 Cans of beer per week     Comment: occ    Review of Systems  Constitutional: Negative for fever and chills.  Respiratory: Negative for cough, chest tightness and shortness of breath.   Cardiovascular: Negative for chest pain, palpitations and leg swelling.  Genitourinary: Negative for dysuria, urgency, frequency and hematuria.  Musculoskeletal: Positive for myalgias, joint swelling and arthralgias. Negative for neck pain and neck stiffness.  Skin: Positive  for color change and wound. Negative for rash.  Allergic/Immunologic: Negative for immunocompromised state.  Neurological: Negative for dizziness, weakness, light-headedness, numbness and headaches.  All other systems reviewed and are negative.     Allergies  Oxycodone  Home Medications   Prior to Admission medications   Medication Sig Start Date End Date Taking? Authorizing Provider  amLODipine (NORVASC) 10 MG tablet Take 1 tablet (10 mg total) by mouth every morning. 10/16/14  Yes Leana Gamer, MD  cephALEXin (KEFLEX) 500 MG capsule Take 500 mg by mouth 4 (four) times daily. 10/16/14 10/30/14 Yes Historical Provider, MD  cetirizine (ZYRTEC) 10 MG tablet Take 10 mg by mouth daily as needed for allergies.   Yes Historical Provider, MD  docusate sodium (COLACE) 100 MG capsule Take 100 mg by mouth daily as needed for constipation.   Yes Historical Provider, MD  OVER THE COUNTER MEDICATION Take 1 capsule by mouth daily as needed (erectile dysfunction). X AGAIN   Yes Historical Provider, MD  penicillin v potassium (VEETID) 500 MG tablet Take 500 mg by mouth 2 (two) times daily.   Yes Historical Provider, MD  pyridOXINE (VITAMIN B-6) 100 MG tablet Take 100 mg by mouth every morning.    Yes Historical Provider, MD  rosuvastatin (CRESTOR) 20 MG tablet Take 1 tablet (20 mg total) by mouth daily. 10/16/14  Yes Leana Gamer, MD  gabapentin (NEURONTIN) 300 MG capsule Take 1 capsule (300 mg  total) by mouth 3 (three) times daily. Patient taking differently: Take 600 mg by mouth 3 (three) times daily.  05/05/14   Leana Gamer, MD  HYDROcodone-acetaminophen (NORCO/VICODIN) 5-325 MG per tablet Take 1 tablet by mouth every 6 (six) hours as needed for moderate pain. Patient not taking: Reported on 10/16/2014 08/10/14   Dalia Heading, PA-C  ibuprofen (ADVIL,MOTRIN) 800 MG tablet Take 1 tablet (800 mg total) by mouth every 8 (eight) hours as needed. Patient not taking: Reported on 10/16/2014  08/10/14   Dalia Heading, PA-C   BP 126/99 mmHg  Pulse 45  Temp(Src) 98.2 F (36.8 C) (Oral)  Resp 18  SpO2 100% Physical Exam  Constitutional: He appears well-developed and well-nourished. No distress.  HENT:  Head: Normocephalic and atraumatic.  Eyes: Conjunctivae are normal.  Neck: Neck supple.  Cardiovascular: Normal rate, regular rhythm and normal heart sounds.   Pulmonary/Chest: Effort normal. No respiratory distress. He has no wheezes. He has no rales.  Abdominal: Soft. Bowel sounds are normal. He exhibits no distension. There is no tenderness. There is no rebound.  Musculoskeletal: He exhibits no edema.  Swelling noted to the right great toe, swelling extends into the first metatarsal of the distal foot. Tender to palpation over the area. Skin is warm to touch. There is a wound to the plantar surface of the distal right first toe, with purulent drainage.  Neurological: He is alert.  Skin: Skin is warm and dry.  Nursing note and vitals reviewed.   ED Course  Procedures (including critical care time) Labs Review Labs Reviewed  CBC WITH DIFFERENTIAL/PLATELET - Abnormal; Notable for the following:    RBC 4.17 (*)    All other components within normal limits  BASIC METABOLIC PANEL - Abnormal; Notable for the following:    BUN 26 (*)    Creatinine, Ser 1.94 (*)    GFR calc non Af Amer 37 (*)    GFR calc Af Amer 43 (*)    All other components within normal limits    Imaging Review No results found.   EKG Interpretation None      MDM   Final diagnoses:  Osteomyelitis of toe of right foot    Patient is here with osteomyelitis and septic IP joints by MRI last month. He is afebrile, nontoxic appearing, here was worsening infection to the toe in drainage from the wound. Labs ordered. Will admit to try it.  Spoke with [facility], defer antibiotic selection to them. We'll speak with orthopedics.  Spoke with Dr. Elmyra Ricks, will consut.   Filed Vitals:   10/17/14 1011  10/17/14 1224  BP: 145/105 126/99  Pulse: 57 45  Temp: 98.2 F (36.8 C)   TempSrc: Oral   Resp: 20 18  SpO2: 100% 100%     Jeannett Senior, PA-C 10/17/14 [place], MD 10/18/14 1114

## 2014-10-17 NOTE — Consult Note (Signed)
Admit date: 10/17/2014 Referring Physician  Dr. Sheran Fava Primary Physician Jackie Plum, Shelia Media, MD Primary Cardiologist  none Reason for Consultation  New onset atrial flutter  HPI: 54 year old male with mantle cell lymphoma status post stem cell transplant, splenectomy, diet controlled diabetes well controlled, hypertension, chronic kidney disease stage III with prior Bell's palsy who was seen earlier today in the wound care clinic and told to come to the emergency room for IV antibiotics and orthopedics consultation for possible toe amputation because of nonhealing right first toe ulcer. While in the emergency room, atrial flutter was noted with heart rate in the 44 beats per minute, 6:1 conduction. He is on no AV nodal blocking agents. He only takes amlodipine for blood pressure.   Last echocardiogram in system was in 2007 where his ejection fraction was normal, normal left atrial size. Prior chest x-ray from December 2015 showed no overt cardiomegaly.  Creatinine has ranged from 2.4-1.6. Currently his potassium is 4.5, normal. Troponin is normal.  EKG from 07/21/14 shows sinus bradycardia rate 58 with no other significant abnormalities.  Currently is with his wife, feels well, no SOB, no CP, no syncope, no orthopnea.   PMH:   Past Medical History  Diagnosis Date  . Hypertension   . Diabetes mellitus   . Bell's palsy   . Cancer     Mantle Cell Cancer- S/P stem cell transplant  . History of splenectomy   . Polysubstance abuse   . Neuropathy     right leg due to football injury  . CAD (coronary artery disease)   . Sinus bradycardia   . CKD (chronic kidney disease) stage 3, GFR 30-59 ml/min     PSH:   Past Surgical History  Procedure Laterality Date  . Splenectomy, total      mantle cell lymphoma  . Edentulation N/A y-2    prior to transplant   Allergies:  Oxycodone Prior to Admit Meds:   Prior to Admission medications   Medication Sig Start Date End Date Taking?  Authorizing Provider  amLODipine (NORVASC) 10 MG tablet Take 1 tablet (10 mg total) by mouth every morning. 10/16/14  Yes Leana Gamer, MD  cephALEXin (KEFLEX) 500 MG capsule Take 500 mg by mouth 4 (four) times daily. 10/16/14 10/30/14 Yes Historical Provider, MD  cetirizine (ZYRTEC) 10 MG tablet Take 10 mg by mouth daily as needed for allergies.   Yes Historical Provider, MD  docusate sodium (COLACE) 100 MG capsule Take 100 mg by mouth daily as needed for constipation.   Yes Historical Provider, MD  OVER THE COUNTER MEDICATION Take 1 capsule by mouth daily as needed (erectile dysfunction). X AGAIN   Yes Historical Provider, MD  penicillin v potassium (VEETID) 500 MG tablet Take 500 mg by mouth 2 (two) times daily.   Yes Historical Provider, MD  pyridOXINE (VITAMIN B-6) 100 MG tablet Take 100 mg by mouth every morning.    Yes Historical Provider, MD  rosuvastatin (CRESTOR) 20 MG tablet Take 1 tablet (20 mg total) by mouth daily. 10/16/14  Yes Leana Gamer, MD  gabapentin (NEURONTIN) 300 MG capsule Take 1 capsule (300 mg total) by mouth 3 (three) times daily. Patient taking differently: Take 300 mg by mouth 4 (four) times daily.  05/05/14   Leana Gamer, MD  HYDROcodone-acetaminophen (NORCO/VICODIN) 5-325 MG per tablet Take 1 tablet by mouth every 6 (six) hours as needed for moderate pain. Patient not taking: Reported on 10/16/2014 08/10/14   Dalia Heading, PA-C  ibuprofen (ADVIL,MOTRIN) 800 MG tablet Take 1 tablet (800 mg total) by mouth every 8 (eight) hours as needed. Patient not taking: Reported on 10/16/2014 08/10/14   Dalia Heading, PA-C   Fam HX:    Family History  Problem Relation Age of Onset  . Hypertension Mother   . Diabetes Mother   . Cancer Father     pancreatic cancer  . Diabetes Father   . Heart disease Father   . Diabetes Sister   Father MI in his 29's  Social HX:    History   Social History  . Marital Status: Married    Spouse Name: N/A  . Number  of Children: N/A  . Years of Education: N/A   Occupational History  . Not on file.   Social History Main Topics  . Smoking status: Never Smoker   . Smokeless tobacco: Not on file  . Alcohol Use: 0.6 oz/week    1 Cans of beer per week     Comment: does not drink every day.  no history of withdrawal   . Drug Use: Yes    Special: Marijuana     Comment: Cocaine, prescription drug abuse, etc  . Sexual Activity:    Partners: Female   Other Topics Concern  . Not on file   Social History Narrative     ROS: Positive for toe wound, swelling, pain. No chest pain no shortness of breath, no palpitations. All 11 ROS were addressed and are negative except what is stated in the HPI   Physical Exam: Blood pressure 138/103, pulse 43, temperature 97.7 F (36.5 C), temperature source Oral, resp. rate 18, SpO2 97 %.   General: Well developed, well nourished, in no acute distress Head: Eyes PERRLA, No xanthomas.   Normal cephalic and atramatic  Lungs:   Clear bilaterally to auscultation and percussion. Normal respiratory effort. No wheezes, no rales. Heart:   Regular, bradycardic Pulses are 2+ & equal. No murmur, rubs, gallops.  No carotid bruit. No JVD.  No abdominal bruits.  Abdomen: Bowel sounds are positive, abdomen soft and non-tender without masses. No hepatosplenomegaly. Msk:  Back normal. Normal strength and tone for age. Extremities: Right toe ulcer noted, actually 1+ pulses.  Neuro: Alert and oriented X 3, non-focal, MAE x 4 GU: Deferred Rectal: Deferred Psych:  Good affect, responds appropriately      Labs: Lab Results  Component Value Date   WBC 5.8 10/17/2014   HGB 13.4 10/17/2014   HCT 40.1 10/17/2014   MCV 96.2 10/17/2014   PLT 241 10/17/2014     Recent Labs Lab 10/17/14 1134  NA 140  K 4.5  CL 102  CO2 29  BUN 26*  CREATININE 1.94*  CALCIUM 9.5  GLUCOSE 98   No results for input(s): CKTOTAL, CKMB, TROPONINI in the last 72 hours. Lab Results  Component  Value Date   CHOL 264* 09/05/2014   HDL 60 09/05/2014   LDLCALC 178* 09/05/2014   TRIG 130 09/05/2014   Lab Results  Component Value Date   DDIMER * 11/30/2010    13.37        AT THE INHOUSE ESTABLISHED CUTOFF VALUE OF 0.48 ug/mL FEU, THIS ASSAY HAS BEEN DOCUMENTED IN THE LITERATURE TO HAVE A SENSITIVITY AND NEGATIVE PREDICTIVE VALUE OF AT LEAST 98 TO 99%.  THE TEST RESULT SHOULD BE CORRELATED WITH AN ASSESSMENT OF THE CLINICAL PROBABILITY OF DVT / VTE. REPEATED TO VERIFY     Radiology:  Chest x-ray as above, no  cardiomegaly Personally viewed.  EKG:  EKG as above, typical atrial flutter pattern heart rate of 44 bpm with 6-1 conduction Personally viewed.   ASSESSMENT/PLAN:    54 year old male with right nonhealing ulcer, osteomyelitis with incidental finding of atrial flutter, typical with diet controlled diabetes, hyperlipidemia, hypertension.  1. Atrial flutter-currently stable hemodynamically. No urgent need for cardioversion. It is possible given his underlying toe infection that this may have incited atrial flutter. I agree with checking TSH, echocardiogram. Troponin is normal, I do not think this is acute coronary syndrome, he is having no chest pain. CHADS-VASc - 2-3 (1-HTN, 1-DM, 1-presumed vascular disease but pulses are 1+). Agree with anticoagulation, heparin is reasonable currently as we are trying to determine surgical needs. Once postop or if surgery is not needed, would recommend NOAC (Eliquis for instance). He may spontaneously convert however if he has been in atrial flutter for a prolonged duration of time, this may not be the case. In December 2015, he was in sinus bradycardia rate 58. Since he is not having any symptoms from his atrial flutter, I would like for him to be on anticoagulation for 3 weeks then proceed with elective cardioversion at that time. Discussed with he and wife.  2. Preoperative risk stratification - he is not having any chest pain, shortness of  breath or any other cardiovascular limitations from his atrial flutter with slow ventricular response. I do not believe that his atrial flutter would hinder him from amputation if necessary. He may proceed if necessary. Checking echocardiogram for ejection fraction. Try to avoid AV nodal blocking agents.  We will follow with you.  Candee Furbish, MD  10/17/2014  4:29 PM

## 2014-10-17 NOTE — H&P (Addendum)
[facility] History and Physical  Adis Sturgill ZMO:[id number] DOB: 1961-07-15 DOA: 10/17/2014  Referring physician:  Jeannett Senior PCP:  Lovina Reach, MD   Chief Complaint:  Toe swelling and pain  HPI:  The patient is a 54 y.o. year-old male with history of hypertension, diabetes mellitus 2 A1c 6 on no medications, CKD stage III, mantle cell lymphoma status post autologous stem cell transplant and splenectomy, polysubstance abuse, Bell's palsy who presents with toe swelling and pain.  The patient has had problems with ulcers on his right foot first toe on and off for many years. He has neuropathy in his right leg secondary to a football injury. He is followed at the wound care center and over time they have been able to heal the ulcer but then it returns.  About a month ago, he already had an open ulcer but he noticed progressive soreness of the right first toe with some swelling. He was started on ciprofloxacin and Keflex by the wound care center and had an MRI of his first toe 09/29/2014 which demonstrated osteomyelitis of the proximal and terminal phalanx of the great toe with septic IP joint.  He was told he needs to decide between amputation and starting hyperbaric chamber treatments. The patient elected hyperbaric chamber treatments, however, it took a long time for his insurance to process the request.  After starting antibiotics, his toe swelling improved but the soreness remained.  About a week ago, he developed worsening swelling and pain, particularly with ambulating.  He was seen at the wound care clinic and told to come to the ER for IV antibiotics and orthopedics consultation for possible toe amputation.    In the ER, afebrile, normal WBC.  Creatinine at baseline near 1.9.  Have not ordered repeat imaging yet while awaiting orthopedics request, anticipate they may need repeat MRI prior to surgery.  Incidentally found to be in a-flutter with complete heart block,  junctional rate in 40s.  Denies chest pain or pressure, SOB, or palpitations.  Unknown duration of symptoms.    Review of Systems:  General:  Denies fevers, chills, weight loss or gain HEENT:  Denies changes to hearing and vision, rhinorrhea, sinus congestion, sore throat CV:  Denies chest pain and palpitations, lower extremity edema.  PULM:  Denies SOB, wheezing, cough.   GI:  Denies nausea, vomiting, constipation, diarrhea.   GU:  Denies dysuria, frequency, urgency ENDO:  Denies polyuria, polydipsia.   HEME:  Denies hematemesis, blood in stools, melena, abnormal bruising or bleeding.  LYMPH:  Denies lymphadenopathy.   MSK:  Per HPI DERM:  Denies skin rash or ulcer.   NEURO:  Denies focal numbness, weakness, slurred speech, confusion, facial droop.  PSYCH:  Denies anxiety and depression.    Past Medical History  Diagnosis Date  . Hypertension   . Diabetes mellitus   . Bell's palsy   . Cancer     Mantle Cell Cancer- S/P stem cell transplant  . History of splenectomy   . Polysubstance abuse   . Neuropathy     right leg due to football injury  . CAD (coronary artery disease)   . Sinus bradycardia   . CKD (chronic kidney disease) stage 3, GFR 30-59 ml/min    Past Surgical History  Procedure Laterality Date  . Splenectomy, total      mantle cell lymphoma  . Edentulation N/A y-2    prior to transplant   Social History:  reports that he has never smoked. He does  not have any smokeless tobacco history on file. He reports that he drinks about 0.6 oz of alcohol per week. He reports that he uses illicit drugs (Marijuana).  Allergies  Allergen Reactions  . Oxycodone Itching and Palpitations    Family History  Problem Relation Age of Onset  . Hypertension Mother   . Diabetes Mother   . Cancer Father     pancreatic cancer  . Diabetes Father   . Heart disease Father   . Diabetes Sister      Prior to Admission medications   Medication Sig Start Date End Date Taking?  Authorizing Provider  amLODipine (NORVASC) 10 MG tablet Take 1 tablet (10 mg total) by mouth every morning. 10/16/14  Yes Leana Gamer, MD  cephALEXin (KEFLEX) 500 MG capsule Take 500 mg by mouth 4 (four) times daily. 10/16/14 10/30/14 Yes Historical Provider, MD  cetirizine (ZYRTEC) 10 MG tablet Take 10 mg by mouth daily as needed for allergies.   Yes Historical Provider, MD  docusate sodium (COLACE) 100 MG capsule Take 100 mg by mouth daily as needed for constipation.   Yes Historical Provider, MD  OVER THE COUNTER MEDICATION Take 1 capsule by mouth daily as needed (erectile dysfunction). X AGAIN   Yes Historical Provider, MD  penicillin v potassium (VEETID) 500 MG tablet Take 500 mg by mouth 2 (two) times daily.   Yes Historical Provider, MD  pyridOXINE (VITAMIN B-6) 100 MG tablet Take 100 mg by mouth every morning.    Yes Historical Provider, MD  rosuvastatin (CRESTOR) 20 MG tablet Take 1 tablet (20 mg total) by mouth daily. 10/16/14  Yes Leana Gamer, MD  gabapentin (NEURONTIN) 300 MG capsule Take 1 capsule (300 mg total) by mouth 3 (three) times daily. Patient taking differently: Take 600 mg by mouth 3 (three) times daily.  05/05/14   Leana Gamer, MD  HYDROcodone-acetaminophen (NORCO/VICODIN) 5-325 MG per tablet Take 1 tablet by mouth every 6 (six) hours as needed for moderate pain. Patient not taking: Reported on 10/16/2014 08/10/14   Dalia Heading, PA-C  ibuprofen (ADVIL,MOTRIN) 800 MG tablet Take 1 tablet (800 mg total) by mouth every 8 (eight) hours as needed. Patient not taking: Reported on 10/16/2014 08/10/14   Dalia Heading, PA-C   Physical Exam: Filed Vitals:   10/17/14 1011 10/17/14 1224 10/17/14 1444  BP: 145/105 126/99 138/103  Pulse: 57 45 43  Temp: 98.2 F (36.8 C)  97.7 F (36.5 C)  TempSrc: Oral  Oral  Resp: 20 18 18   SpO2: 100% 100% 97%     General:  Obese male,  no acute distress  Eyes:  PERRL, anicteric, non-injected.  ENT:  Nares clear.   OP clear, non-erythematous without plaques or exudates.  MMM.  Neck:  Supple without TM or JVD.    Lymph:  No cervical, supraclavicular, or submandibular LAD.  Cardiovascular:  RRR, normal S1, S2, without m/r/g.  2+ pulses, warm extremities  Respiratory:  CTA bilaterally without increased WOB.  Abdomen:  NABS.  Soft, ND/NT.    Skin:  Deep ulcer on the first big toe of the right foot down to the bone.  First toe, second toe, and distal foot are swollen, minimal surrounding erythema however the foot feels warmer than the left foot  Musculoskeletal:  Normal bulk and tone.  Old scar with some bony changes about 4-5 cm above the ankle with some swelling of ankle  Psychiatric:  A & O x 4.  Appropriate affect.  Neurologic:  CN 3-12 intact.  5/5 strength.  Sensation intact except decreased sensation from knee to foot right leg  Labs on Admission:  Basic Metabolic Panel:  Recent Labs Lab 10/17/14 1134  NA 140  K 4.5  CL 102  CO2 29  GLUCOSE 98  BUN 26*  CREATININE 1.94*  CALCIUM 9.5   Liver Function Tests: No results for input(s): AST, ALT, ALKPHOS, BILITOT, PROT, ALBUMIN in the last 168 hours. No results for input(s): LIPASE, AMYLASE in the last 168 hours. No results for input(s): AMMONIA in the last 168 hours. CBC:  Recent Labs Lab 10/17/14 1134  WBC 5.8  NEUTROABS 2.7  HGB 13.4  HCT 40.1  MCV 96.2  PLT 241   Cardiac Enzymes: No results for input(s): CKTOTAL, CKMB, CKMBINDEX, TROPONINI in the last 168 hours.  BNP (last 3 results) No results for input(s): BNP in the last 8760 hours.  ProBNP (last 3 results) No results for input(s): PROBNP in the last 8760 hours.  CBG: No results for input(s): GLUCAP in the last 168 hours.  Radiological Exams on Admission: No results found.  EKG:  Atrial flutter with complete heart block, junctional rate in 40s.  Appears to have upright T-waves except for V1 and aVR and no obvious ST segment  elevations  Assessment/Plan Principal Problem:   Atrial flutter Active Problems:   HTN (hypertension)   History of bradycardia   Diabetes mellitus type 2, controlled, with complications   CKD (chronic kidney disease) stage 3, GFR 30-59 ml/min   Osteomyelitis of toe of right foot   Septic arthritis of interphalangeal joint of toe of right foot  ---  Atrial flutter with complete heart block, hemodynamically stable currently, chest pain free, unclear duration.   -  Cardiology consultation -  May need urgent pacing if hypotension or worsening bradycardia -  troponins -  TSH -  ECHO -  Telemetry -  CHADs2vasc 4 or higher  -  Start heparin gtt  Right foot first big toe osteomyelitis and septic arthritis -  Orthopedics consult pending -  IV vancomycin and Zosyn, dose by pharmacy -  Patient is NOT cleared for surgery until further evaluation and treatment for a-flutter and heart block  Diet-controlled diabetes mellitus type 2, hemoglobin A1c of 6 and 08/2014 -  Diabetic diet when diet resumecd -  Trend a.m. CBG and if rising, consider starting sliding scale insulin  Mantle cell lymphoma status post splenectomy and autologous stem cell transplant  CKD stage III, creatinine near baseline, unclear etiology.  Recent labs demonstrated a creatinine of 3.4 which was probably secondary to high-dose ibuprofen use and has since resolved. -  No need to repeat renal ultrasound at this time is creatinine stable since last ultrasound despite recent AK I -  Advised patient not to use NSAIDs and to use only Tylenol as needed for over-the-counter pain relief -  Minimize nephrotoxins and renally dose medications  CAD/Hypertension/hyperlipidemia, blood pressure stable -  Continue Norvasc and Crestor -  Not on BB likely sec  Chronic pain -  We will verify gabapentin dose with pharmacy -  Vicodin as needed for breakthrough pain from first toe osteomyelitis  Diet:  NPO Access:  PIV IVF:   Off Proph:  Heparin  Code Status: Full Family Communication: Patient and his wife Disposition Plan: telemetry  Time spent: 60 min Janece Canterbury [facility] Pager [phone]  If 7PM-7AM, please contact night-coverage [email or web address] Password [facility] 10/17/2014, 3:43 PM

## 2014-10-17 NOTE — Progress Notes (Signed)
Patient ID: Cory Marks, male   DOB: 09/11/1960, 54 y.o.   MRN: [id number]  HPI 54 y.o. male  presents for 3 month follow up with hypertension. He was seen at [facility] and referred to Nephrology by his Hematologist. Pt reports that he wsa advised to stop lisinopril and resume Norvasc. He stopped the Lisinopril and resuled Norvasc 10 mg from a previous prescription. A review of the records from [facility] shows that a prescription for Norvasc 10 mg was pl;aced on 07/09/2014. However patient reports tht he never picked up the prescription. Please note that Cory Marks has not followed up for his chronic disease as recommended. He attributes this to demands on his time due to his father's illness.  I have reviewed recent labs and renal function at baseline. Pt has been having ongoing problems with foot ulcer and is under the care of the [facility]. Currently he is taking Keflex 500 mg every 6 hours under the care of Dr. Dellia Nims.  His blood pressure has not been controlled at home, today their BP is BP: (!) 121/91 mmHg   He does not workout. He denies chest pain, shortness of breath, dizziness.  He is on cholesterol medication and denies myalgias. His cholesterol is not at goal. The cholesterol last visit was:   Lab Results  Component Value Date   CHOL 264* 09/05/2014   HDL 60 09/05/2014   LDLCALC 178* 09/05/2014   TRIG 130 09/05/2014   CHOLHDL 4.4 09/05/2014   He has not been working on diet and exercise for prediabetes, and denies hyperglycemia, hypoglycemia , polydipsia, polyuria and visual disturbances. Last A1C in the office was:  Lab Results  Component Value Date   HGBA1C 6.0* 09/05/2014   Patient is not on Vitamin D supplement.   No results found for: VD25OH     Current Medications:  No current facility-administered medications on file prior to visit.   Current Outpatient Prescriptions on File Prior to Visit  Medication Sig Dispense Refill  . docusate sodium (COLACE) 100  MG capsule Take 100 mg by mouth daily as needed for constipation.    . gabapentin (NEURONTIN) 300 MG capsule Take 1 capsule (300 mg total) by mouth 3 (three) times daily. (Patient taking differently: Take 300 mg by mouth 4 (four) times daily. ) 90 capsule 3  . OVER THE COUNTER MEDICATION Take 1 capsule by mouth daily as needed (erectile dysfunction). X AGAIN    . penicillin v potassium (VEETID) 500 MG tablet Take 500 mg by mouth 2 (two) times daily.    Marland Kitchen pyridOXINE (VITAMIN B-6) 100 MG tablet Take 100 mg by mouth every morning.     . cetirizine (ZYRTEC) 10 MG tablet Take 10 mg by mouth daily as needed for allergies.    Marland Kitchen HYDROcodone-acetaminophen (NORCO/VICODIN) 5-325 MG per tablet Take 1 tablet by mouth every 6 (six) hours as needed for moderate pain. (Patient not taking: Reported on 10/16/2014) 15 tablet 0  . ibuprofen (ADVIL,MOTRIN) 800 MG tablet Take 1 tablet (800 mg total) by mouth every 8 (eight) hours as needed. (Patient not taking: Reported on 10/16/2014) 21 tablet 0   Medical History:  Past Medical History  Diagnosis Date  . Hypertension   . Diabetes mellitus   . Bell's palsy   . Cancer     Mantle Cell Cancer- S/P stem cell transplant  . History of splenectomy   . Polysubstance abuse   . Neuropathy     right leg due to football  injury  . CAD (coronary artery disease)   . Sinus bradycardia   . CKD (chronic kidney disease) stage 3, GFR 30-59 ml/min    Allergies:  Allergies  Allergen Reactions  . Oxycodone Itching and Palpitations     Review of Systems:  Review of Systems  Constitutional: Negative.  Negative for fever and chills.  HENT: Negative.   Eyes: Negative.   Respiratory: Negative.   Cardiovascular: Negative.   Gastrointestinal: Negative.   Genitourinary: Negative.   Musculoskeletal: Negative.   Skin: Negative.        Pt has an ulcer on the right great toe.    Neurological: Negative.   Endo/Heme/Allergies: Negative.   Psychiatric/Behavioral: Negative.       Family history- Review and unchanged Social history- Review and unchanged Physical Exam: BP 121/91 mmHg  Pulse 64  Temp(Src) 98.5 F (36.9 C) (Oral)  Resp 16  Ht 6' (1.829 m)  Wt 217 lb (98.431 kg)  BMI 29.42 kg/m2 Wt Readings from Last 3 Encounters:  10/16/14 217 lb (98.431 kg)  09/29/14 227 lb (102.967 kg)  07/22/14 227 lb (102.967 kg)   General Appearance: Well nourished, in no apparent distress. Eyes: PERRLA, EOMs, conjunctiva no swelling or erythema Neck: Supple, thyroid normal.  Respiratory: Respiratory effort normal, BS equal bilaterally without rales, rhonchi, wheezing or stridor.  Cardio: RRR with no MRGs. Brisk peripheral pulses without edema.  Abdomen: Soft, + BS.  Non tender, no guarding, rebound, hernias, masses. Lymphatics: Non tender without lymphadenopathy.  Musculoskeletal: Full ROM, 5/5 strength, normal gait.  Skin: Warm, dry without rashes, lesions, ecchymosis.  Neuro: Cranial nerves intact. Normal muscle tone, no cerebellar symptoms. Sensation intact.  Psych: Awake and oriented X 3, normal affect, Insight and Judgment appropriate.   Assessment and Plan:  1. Essential hypertension - Pt is not clear if Cory Marks is taking nay medications for his HTN. It will resume Norvasc 10 mg. I have discussed the possible adverse effects of edema associate with use of higher doses of Amlodipine. - Pt to follow up in 1 month for BP evaluation. Also will order Urine micro albumin and BMET in 2 months.   2. Hyperlipidemia - It is unclear how compliant patietn has been with Crestor as he reports being without his medication for some time. I will re-order Crestor at current dose. Check lipids in 2 months as last lipid level was 1 month ago. - rosuvastatin (CRESTOR) 20 MG tablet; Take 1 tablet (20 mg total) by mouth daily.  Dispense: 90 tablet; Refill: 3  3. Pre-diabetes - Pt has a history of having diabetes in the past. However after treatment for Mantle Cell Lymphoma,  he was taken off all diabetic medications and has had normal blood sugars ever since. His last Hb A1c was in the pre-diabetic stage. However the foot ulcer that he has begs the question of whether the patient has ongoing diabetic physiology. He is not a candidate for Metfornmin because of the decreased GFR associated with elevated Cr.   Hypertension: Continue medication, monitor blood pressure at home. Continue DASH diet.  Reminder to go to the ER if any CP, SOB, nausea, dizziness, severe HA, changes vision/speech, left arm numbness and tingling, and jaw pain.  Cholesterol: Continue diet and exercise. Check cholesterol.   Pre-diabetes-Continue diet and exercise. Check A1C  Vitamin D Def- check level.   Continue diet and meds as discussed. Further disposition pending results of labs.  Rahcel Shutes A., MD 4:02 PM [facility]

## 2014-10-17 NOTE — Consult Note (Signed)
Reason for Consult:Right great toe ulcer Referring Physician: Dr. Short  Cory Marks is an 54 y.o. male.  HPI: Cory Marks is a 54 yo male with a 2 year history of an ulcer on his right great toe. He has a history of neuropathy and foot drop on right and approximately 2 years ago developed an ulcer on the medial border of his right great toe. He states it was treated in the wound care clinic and eventually healed. Unfortunately a blister, then ulcer developed on the toe again in the past few months. E is again being treated in the wound care center and options of surgery vs. Hyperbaric treatments were given to him. He had an MRI recently showing septic IP joint and osteomyelitis of the proximal and distal phalange of the right great toe. He was going to start hyperbaric treatments but the toe swelled precipitously in the past few days and the wound care clinic saw him today and told him to go to the ED for antibiotics and surgical consultation. He has not had any recent fever, Chills or systemic symptoms. He desires surgical correction for this infection.  Past Medical History  Diagnosis Date  . Hypertension   . Diabetes mellitus   . Bell's palsy   . Cancer     Mantle Cell Cancer- S/P stem cell transplant  . History of splenectomy   . Polysubstance abuse   . Neuropathy     right leg due to football injury  . CAD (coronary artery disease)   . Sinus bradycardia   . CKD (chronic kidney disease) stage 3, GFR 30-59 ml/min     Past Surgical History  Procedure Laterality Date  . Splenectomy, total      mantle cell lymphoma  . Edentulation N/A y-2    prior to transplant    Family History  Problem Relation Age of Onset  . Hypertension Mother   . Diabetes Mother   . Cancer Father     pancreatic cancer  . Diabetes Father   . Heart disease Father   . Diabetes Sister     Social History:  reports that he has never smoked. He does not have any smokeless tobacco history on file. He reports that  he drinks about 0.6 oz of alcohol per week. He reports that he uses illicit drugs (Marijuana).  Allergies:  Allergies  Allergen Reactions  . Oxycodone Itching and Palpitations    Medications: I have reviewed the patient's current medications.  Results for orders placed or performed during the hospital encounter of 10/17/14 (from the past 48 hour(s))  CBC with Differential     Status: Abnormal   Collection Time: 10/17/14 11:34 AM  Result Value Ref Range   WBC 5.8 4.0 - 10.5 K/uL   RBC 4.17 (L) 4.22 - 5.81 MIL/uL   Hemoglobin 13.4 13.0 - 17.0 g/dL   HCT 40.1 39.0 - 52.0 %   MCV 96.2 78.0 - 100.0 fL   MCH 32.1 26.0 - 34.0 pg   MCHC 33.4 30.0 - 36.0 g/dL   RDW 14.3 11.5 - 15.5 %   Platelets 241 150 - 400 K/uL   Neutrophils Relative % 47 43 - 77 %   Neutro Abs 2.7 1.7 - 7.7 K/uL   Lymphocytes Relative 43 12 - 46 %   Lymphs Abs 2.5 0.7 - 4.0 K/uL   Monocytes Relative 6 3 - 12 %   Monocytes Absolute 0.4 0.1 - 1.0 K/uL   Eosinophils Relative 3 0 - 5 %     Eosinophils Absolute 0.2 0.0 - 0.7 K/uL   Basophils Relative 1 0 - 1 %   Basophils Absolute 0.0 0.0 - 0.1 K/uL  Basic metabolic panel     Status: Abnormal   Collection Time: 10/17/14 11:34 AM  Result Value Ref Range   Sodium 140 135 - 145 mmol/L   Potassium 4.5 3.5 - 5.1 mmol/L   Chloride 102 96 - 112 mmol/L   CO2 29 19 - 32 mmol/L   Glucose, Bld 98 70 - 99 mg/dL   BUN 26 (H) 6 - 23 mg/dL   Creatinine, Ser 1.94 (H) 0.50 - 1.35 mg/dL   Calcium 9.5 8.4 - 10.5 mg/dL   GFR calc non Af Amer 37 (L) >90 mL/min   GFR calc Af Amer 43 (L) >90 mL/min    Comment: (NOTE) The eGFR has been calculated using the CKD EPI equation. This calculation has not been validated in all clinical situations. eGFR's persistently <90 mL/min signify possible Chronic Kidney Disease.    Anion gap 9 5 - 15  I-stat troponin, ED     Status: None   Collection Time: 10/17/14  3:56 PM  Result Value Ref Range   Troponin i, poc 0.01 0.00 - 0.08 ng/mL    Comment 3            Comment: Due to the release kinetics of cTnI, a negative result within the first hours of the onset of symptoms does not rule out myocardial infarction with certainty. If myocardial infarction is still suspected, repeat the test at appropriate intervals.     No results found.  ROS Blood pressure 139/99, pulse 45, temperature 98.1 F (36.7 C), temperature source Oral, resp. rate 17, height 6' (1.829 m), SpO2 98 %. Physical Exam  Physical Examination: General appearance - alert, well appearing, and in no distress Mental status - alert, oriented to person, place, and time Neurological - alert, oriented, normal speech, no focal findings or movement disorder noted Right foot- No swelling or warmth; great toe swollen and warm at IP joint with open wound on medial border of great toe. No odor; No purulent drainage Pulses intact; toes pink and warm   Assessment/Plan: Right great toe non-healing ulcer- Has osteomyelitis and septic joint with an ulcer that has been intermittently problematic for 2 years and has worsened recently. I have recommended great toe amputation and discussed the procedure, risks, potential complications and expected healing/rehab course with him and he wants to proceed. I want him to have at least 48 hours of antibiotics first and don't see an urgent need for surgery as he is not systemically ill. Will plan on surgery Monday afternoon. The patient has a dilemma in that a family member is terminal and he feels he needs to be there. From my standpoint I would be fine with oral antibiotics at home and having him come in for surgery Monday if that is acceptable to the medical team  Tannah Dreyfuss V 10/17/2014, 6:39 PM      

## 2014-10-17 NOTE — Progress Notes (Addendum)
ANTICOAGULATION CONSULT NOTE - Initial Consult  Pharmacy Consult for heparin Indication: atrial fibrillation  Allergies  Allergen Reactions  . Oxycodone Itching and Palpitations    Patient Measurements:   Heparin Dosing Weight: 98kg  Vital Signs: Temp: 97.7 F (36.5 C) (03/11 1444) Temp Source: Oral (03/11 1444) BP: 138/103 mmHg (03/11 1444) Pulse Rate: 43 (03/11 1444)  Labs:  Recent Labs  10/17/14 1134  HGB 13.4  HCT 40.1  PLT 241  CREATININE 1.94*    Estimated Creatinine Clearance: 52.9 mL/min (by C-G formula based on Cr of 1.94).   Medical History: Past Medical History  Diagnosis Date  . Hypertension   . Diabetes mellitus   . Bell's palsy   . Cancer     Mantle Cell Cancer- S/P stem cell transplant  . History of splenectomy   . Polysubstance abuse   . Neuropathy     right leg due to football injury  . CAD (coronary artery disease)   . Sinus bradycardia   . CKD (chronic kidney disease) stage 3, GFR 30-59 ml/min     Assessment: 74 YOM presents with toe swelling and pain d/t chronic toe ulcer/osteomyelitis. He was found to be in aflutter and complete heart block.  Pharmacy asked to dose heparin gtt.  CHADs2vasc = 2-3,  HAS BLED score = 1  Today, 10/17/2014:  CBC: Hgb and pltc WNL  Renal: SCr elevated with h/o CKD III  Goal of Therapy:  Heparin level 0.3-0.7 units/ml Monitor platelets by anticoagulation protocol: Yes   Plan:   Heparin 4000 unit bolus then 1400 units/hr  Check ~6h heparin level  Daily heparin level and CBC  Add INR to ordered labs  Monitor for bleeding  Await final plans for long-term anticoagulation (renal function may increase risk of bleeding with DOACs)  Doreene Eland, PharmD, BCPS.   Pager: [phone]  10/17/2014,3:53 PM

## 2014-10-17 NOTE — ED Notes (Signed)
MD at bedside. 

## 2014-10-18 DIAGNOSIS — M00871 Arthritis due to other bacteria, right ankle and foot: Secondary | ICD-10-CM

## 2014-10-18 DIAGNOSIS — I483 Typical atrial flutter: Secondary | ICD-10-CM

## 2014-10-18 LAB — CBC
HEMATOCRIT: 39.4 % (ref 39.0–52.0)
Hemoglobin: 13 g/dL (ref 13.0–17.0)
MCH: 31.4 pg (ref 26.0–34.0)
MCHC: 33 g/dL (ref 30.0–36.0)
MCV: 95.2 fL (ref 78.0–100.0)
Platelets: 245 10*3/uL (ref 150–400)
RBC: 4.14 MIL/uL — ABNORMAL LOW (ref 4.22–5.81)
RDW: 14.2 % (ref 11.5–15.5)
WBC: 4.5 10*3/uL (ref 4.0–10.5)

## 2014-10-18 LAB — PROTIME-INR
INR: 1.06 (ref 0.00–1.49)
Prothrombin Time: 13.9 seconds (ref 11.6–15.2)

## 2014-10-18 LAB — BASIC METABOLIC PANEL
Anion gap: 11 (ref 5–15)
BUN: 21 mg/dL (ref 6–23)
CHLORIDE: 102 mmol/L (ref 96–112)
CO2: 26 mmol/L (ref 19–32)
CREATININE: 1.78 mg/dL — AB (ref 0.50–1.35)
Calcium: 9 mg/dL (ref 8.4–10.5)
GFR calc Af Amer: 48 mL/min — ABNORMAL LOW (ref >90)
GFR calc non Af Amer: 42 mL/min — ABNORMAL LOW (ref >90)
Glucose, Bld: 97 mg/dL (ref 70–99)
Potassium: 3.5 mmol/L (ref 3.5–5.1)
Sodium: 139 mmol/L (ref 135–145)

## 2014-10-18 LAB — HEPARIN LEVEL (UNFRACTIONATED): HEPARIN UNFRACTIONATED: 0.22 [IU]/mL — AB (ref 0.30–0.70)

## 2014-10-18 LAB — TROPONIN I
Troponin I: <0.03 ng/mL (ref <0.031)
Troponin I: <0.03 ng/mL (ref <0.031)

## 2014-10-18 MED ORDER — SACCHAROMYCES BOULARDII 250 MG PO CAPS: 250.0000 mg | ORAL_CAPSULE | Freq: Two times a day (BID) | ORAL | Status: DC

## 2014-10-18 MED ORDER — CLINDAMYCIN HCL 300 MG PO CAPS: 300.0000 mg | ORAL_CAPSULE | Freq: Four times a day (QID) | ORAL | Status: DC

## 2014-10-18 MED ORDER — GABAPENTIN 300 MG PO CAPS
600.0000 mg | ORAL_CAPSULE | Freq: Two times a day (BID) | ORAL | Status: DC
Start: 2014-10-18 — End: 2016-03-29

## 2014-10-18 MED ORDER — APIXABAN 2.5 MG PO TABS
5.0000 mg | ORAL_TABLET | ORAL | Status: AC
  Administered 2014-10-18: 5 mg via ORAL
  Filled 2014-10-18: qty 2

## 2014-10-18 MED ORDER — HEPARIN (PORCINE) IN NACL 100-0.45 UNIT/ML-% IJ SOLN
1600.0000 [IU]/h | INTRAMUSCULAR | Status: DC
  Administered 2014-10-18: 1600 [IU]/h via INTRAVENOUS

## 2014-10-18 MED ORDER — APIXABAN 5 MG PO TABS: 5.0000 mg | ORAL_TABLET | Freq: Two times a day (BID) | ORAL | Status: DC

## 2014-10-18 MED ORDER — APIXABAN 2.5 MG PO TABS: 5.0000 mg | ORAL_TABLET | Freq: Two times a day (BID) | ORAL | Status: DC

## 2014-10-18 NOTE — Discharge Instructions (Addendum)
Information on my medicine - ELIQUIS (apixaban)  This medication education was reviewed with me or my healthcare representative as part of my discharge preparation.  The pharmacist that spoke with me during my hospital stay was:  Emiliano Dyer, [facility]  Why was Eliquis prescribed for you? Eliquis was prescribed for you to reduce the risk of forming blood clots that can cause a stroke if you have a medical condition called atrial fibrillation (a type of irregular heartbeat) OR to reduce the risk of a blood clots forming after orthopedic surgery.  What do You need to know about Eliquis ? Take your Eliquis TWICE DAILY - one tablet in the morning and one tablet in the evening with or without food.  It would be best to take the doses about the same time each day.  If you have difficulty swallowing the tablet whole please discuss with your pharmacist how to take the medication safely.  Take Eliquis exactly as prescribed by your doctor and DO NOT stop taking Eliquis without talking to the doctor who prescribed the medication.  Stopping may increase your risk of developing a new clot or stroke.  Refill your prescription before you run out.  After discharge, you should have regular check-up appointments with your healthcare provider that is prescribing your Eliquis.  In the future your dose may need to be changed if your kidney function or weight changes by a significant amount or as you get older.  What do you do if you miss a dose? If you miss a dose, take it as soon as you remember on the same day and resume taking twice daily.  Do not take more than one dose of ELIQUIS at the same time.  Important Safety Information A possible side effect of Eliquis is bleeding. You should call your healthcare provider right away if you experience any of the following: ? Bleeding from an injury or your nose that does not stop. ? Unusual colored urine (red or dark brown) or unusual colored stools (red or  black). ? Unusual bruising for unknown reasons. ? A serious fall or if you hit your head (even if there is no bleeding).  Some medicines may interact with Eliquis and might increase your risk of bleeding or clotting while on Eliquis. To help avoid this, consult your healthcare provider or pharmacist prior to using any new prescription or non-prescription medications, including herbals, vitamins, non-steroidal anti-inflammatory drugs (NSAIDs) and supplements.  This website has more information on Eliquis (apixaban): [email or web address].

## 2014-10-18 NOTE — Progress Notes (Signed)
  Echocardiogram 2D Echocardiogram has been performed.  Cory Marks 10/18/2014, 10:51 AM

## 2014-10-18 NOTE — Progress Notes (Signed)
ANTICOAGULATION CONSULT NOTE -Follow Up Consult  Pharmacy Consult for heparin Indication: atrial fibrillation  Allergies  Allergen Reactions  . Oxycodone Itching and Palpitations    Patient Measurements: Height: 6' (182.9 cm) Weight: 219 lb 11.2 oz (99.655 kg) IBW/kg (Calculated) : 77.6 Heparin Dosing Weight: 98kg  Vital Signs: Temp: 98.2 F (36.8 C) (03/11 1955) Temp Source: Oral (03/11 1955) BP: 139/116 mmHg (03/11 1955) Pulse Rate: 60 (03/11 1955)  Labs:  Recent Labs  10/17/14 1134 10/17/14 1820 10/17/14 2340 10/18/14 0001 10/18/14 0004  HGB 13.4  --   --   --   --   HCT 40.1  --   --   --   --   PLT 241  --   --   --   --   LABPROT  --   --   --   --  13.9  INR  --   --   --   --  1.06  HEPARINUNFRC  --   --   --  0.22*  --   CREATININE 1.94*  --   --   --   --   TROPONINI  --  <0.03 <0.03  --   --     Estimated Creatinine Clearance: 53.2 mL/min (by C-G formula based on Cr of 1.94).   Medical History: Past Medical History  Diagnosis Date  . Hypertension   . Diabetes mellitus   . Bell's palsy   . Cancer     Mantle Cell Cancer- S/P stem cell transplant  . History of splenectomy   . Polysubstance abuse   . Neuropathy     right leg due to football injury  . CAD (coronary artery disease)   . Sinus bradycardia   . CKD (chronic kidney disease) stage 3, GFR 30-59 ml/min     Assessment: 62 YOM presents with toe swelling and pain d/t chronic toe ulcer/osteomyelitis. He was found to be in aflutter and complete heart block.  Pharmacy asked to dose heparin gtt.  CHADs2vasc = 2-3,  HAS BLED score = 1  Today, 10/18/2014:  Initial heparin level = 0.22 (goal 0.3-0.7) on heparin rate of 1400 units/hr  No complications of therapy noted   Goal of Therapy:  Heparin level 0.3-0.7 units/ml Monitor platelets by anticoagulation protocol: Yes   Plan:   Increase heparin drip to 1600 units/hr  Check ~6h heparin level  Daily heparin level and CBC  Monitor for  bleeding  Await final plans for long-term anticoagulation (renal function may increase risk of bleeding with DOACs)  Leone Haven, PharmD  10/18/2014,2:13 AM

## 2014-10-18 NOTE — Discharge Summary (Signed)
Physician Discharge Summary  Cory Marks RCV:[id number] DOB: 1960-12-27 DOA: 10/17/2014  PCP: Lovina Reach, MD  Admit date: 10/17/2014 Discharge date: 10/18/2014  Recommendations for Outpatient Follow-up:  1. Recommended that patient remain inpatient for ongoing IV antibiotics and heparin gtt pending surgery on Monday, however, patient's father is on his death bed and he requested being able to leave for a day to be with his father and then return Sunday night for preparation for surgery on Monday.  Did not want patient to leave AMA without antibiotics or blood thinner so I am discharging him with prescriptions and strict instructions to return immediately to the hospital if he has fevers, chills, worsening pain, swelling, redness of his toe, chest pain, or fainting spells.   2. Given Rx for Clindamycin + florastor, Apixaban, and Gabapentin (BID dosing based on CrCl) 3. Will eventually need cardiology follow up with Dr. Marlou Porch in 3 weeks 4. [facility] RN for wound check.  Dry dressing once daily pending return tomorrow night   Discharge Diagnoses:  Principal Problem:   Atrial flutter Active Problems:   HTN (hypertension)   History of bradycardia   Diabetes mellitus type 2, controlled, with complications   CKD (chronic kidney disease) stage 3, GFR 30-59 ml/min   Osteomyelitis of toe of right foot   Septic arthritis of interphalangeal joint of toe of right foot   Discharge Condition: fair  Diet recommendation: diabetic diet  Wt Readings from Last 3 Encounters:  10/17/14 99.655 kg (219 lb 11.2 oz)  10/16/14 98.431 kg (217 lb)  09/29/14 102.967 kg (227 lb)    History of present illness:  The patient is a 54 y.o. year-old male with history of hypertension, sinus bradycardia, diabetes mellitus 2 A1c 6 on no medications, CKD stage III, mantle cell lymphoma status post autologous stem cell transplant and splenectomy, polysubstance abuse, Bell's palsy, right lower extremity neuropathy and  foot drop secondary who presents with toe swelling and pain.  He is followed at the wound care center for an ulcer on the right first toe. About a month ago, he had progressive soreness and swelling of the right first toe. He was started on ciprofloxacin and Keflex by the wound care center and had an MRI of his first toe 09/29/2014 which demonstrated osteomyelitis of the proximal and terminal phalanx of the great toe with septic IP joint. He had intended to undergo hyperbaric chamber treatments, however, in the meantime, his toe swelling and pain returned. He was seen at the wound care clinic and told to come to the ER for IV antibiotics and orthopedics consultation for toe amputation.   In the ER, afebrile, normal WBC. Creatinine at baseline near 1.9. He was incidentally found to be in a-flutter with 6:1 conduction, ventricular rate in 40s. He denied chest pain or pressure, SOB, or palpitations.   He was seen by orthopedic surgery and cardiology. He was started on IV vancomycin and Zosyn in anticipation of surgery.  He was scheduled for surgery on Monday.  Cardiology cleared him for surgery.  The initial plan was for him to remain on IV antibiotics and heparin until Monday, however, the patient's father is very ill and likely nearing death.  The patient demanded to be discharged so that he could spend some time with his father.  I have arranged for oral clindamycin with florastor for his foot and eliquis.  He has been given his first dose here and should take tonight's dose and tomorrow morning's dose.  He needs to  return to the ER on Sunday night to resume IV antibiotics and to reconsult Dr. Aluisio/Gioffre.   Hospital Course:   Atrial flutter with complete heart block vs. Variable conduction, hemodynamically stable currently, chest pain free, unclear duration.  - Cardiology consulted and cleared patient for surgery - May need urgent pacing if hypotension or worsening bradycardia - troponins:  negative - TSH:  1.546 - ECHO:  Report pending - Telemetry:  Atrial flutter, ventricular rate in 29s - CHADs2vasc 2-3 - Eliquis pending surgery   Right foot first big toe osteomyelitis and septic arthritis - Orthopedics, Dr. Wynelle Link and [facility], consulted and will operate on Monday - Given IV vancomycin and Zosyn with some improvement in pain and swelling -  Started on oral clindamycin at discharge  Diet-controlled diabetes mellitus type 2, hemoglobin A1c of 6 and 08/2014 - Diabetic diet  Mantle cell lymphoma status post splenectomy and autologous stem cell transplant  CKD stage III, creatinine near baseline, unclear etiology. Recent labs demonstrated a creatinine of 3.4 which was probably secondary to high-dose ibuprofen use and has since resolved. - No need to repeat renal ultrasound at this time is creatinine stable since last ultrasound despite recent AK I - Advised patient not to use NSAIDs and to use only Tylenol as needed for over-the-counter pain relief - Minimize nephrotoxins and renally dose medications  CAD/Hypertension/hyperlipidemia, blood pressure stable - Continued Norvasc and Crestor - No AV nodal blocking medications  Chronic pain - Gabapentin changed to 600mg  BID based on CrCl - Vicodin as needed for breakthrough pain from first toe osteomyelitis  Procedures:  ECHO, report pending  Consultations:  Cardiology, Dr. Marlou Porch  Orthopedics, Dr. Wynelle Link and Dr. Gladstone Lighter  Discharge Exam: Filed Vitals:   10/18/14 0532  BP: 144/115  Pulse: 48  Temp: 97.5 F (36.4 C)  Resp: 18   Filed Vitals:   10/17/14 1800 10/17/14 1812 10/17/14 1955 10/18/14 0532  BP:  139/99 139/116 144/115  Pulse:  45 60 48  Temp:  98.1 F (36.7 C) 98.2 F (36.8 C) 97.5 F (36.4 C)  TempSrc:  Oral Oral Oral  Resp:  17 18 18   Height:  6' (1.829 m)    Weight: 99.655 kg (219 lb 11.2 oz)     SpO2:  98% 100% 99%    General: Obese male, no acute  distress Cardiovascular: RRR, normal S1, S2, without m/r/g. 2+ pulses, warm extremities Respiratory: CTA bilaterally without increased WOB MSK:  Normal bulk and tone. Old scar with some bony changes about 4-5 cm above the ankle with some swelling of ankle.  First toe right foot and forefoot appear slightly less swollen today, no foul odor or drainage, less warm to touch.  1cm diameter ulcer to bone on toe appears unchanged.   Discharge Instructions      Discharge Instructions    Call MD for:  difficulty breathing, headache or visual disturbances    Complete by:  As directed      Call MD for:  extreme fatigue    Complete by:  As directed      Call MD for:  hives    Complete by:  As directed      Call MD for:  persistant dizziness or light-headedness    Complete by:  As directed      Call MD for:  persistant nausea and vomiting    Complete by:  As directed      Call MD for:  severe uncontrolled pain    Complete by:  As directed      Call MD for:  temperature >100.4    Complete by:  As directed      Diet Carb Modified    Complete by:  As directed      Discharge instructions    Complete by:  As directed   You were hospitalized with a toe infection and were found to have a heart rhythm problem.  Please take clindamycin 4 times a day to treat your toe infection.  Take florastor to prevent infectious diarrhea from your potent antibiotic.  For your atrial flutter heart rhythm, please take eliquis (apixaban).  Take your next dose this evening and the following dose Sunday morning.  Do NOT take your dose Sunday night and instead return to the hospital emergency room for readmission and IV antibiotics prior to your surgery on Monday.  If you have  fevers, chills, worsening pain, swelling, redness of his toe, chest pain, or fainting spells, please return to the hospital immediately.     Increase activity slowly    Complete by:  As directed             Medication List    STOP taking these  medications        cephALEXin 500 MG capsule  Commonly known as:  KEFLEX     ibuprofen 800 MG tablet  Commonly known as:  ADVIL,MOTRIN      TAKE these medications        amLODipine 10 MG tablet  Commonly known as:  NORVASC  Take 1 tablet (10 mg total) by mouth every morning.     apixaban 5 MG Tabs tablet  Commonly known as:  ELIQUIS  Take 1 tablet (5 mg total) by mouth 2 (two) times daily.     cetirizine 10 MG tablet  Commonly known as:  ZYRTEC  Take 10 mg by mouth daily as needed for allergies.     clindamycin 300 MG capsule  Commonly known as:  CLEOCIN  Take 1 capsule (300 mg total) by mouth 4 (four) times daily.     docusate sodium 100 MG capsule  Commonly known as:  COLACE  Take 100 mg by mouth daily as needed for constipation.     gabapentin 300 MG capsule  Commonly known as:  NEURONTIN  Take 2 capsules (600 mg total) by mouth 2 (two) times daily.     HYDROcodone-acetaminophen 5-325 MG per tablet  Commonly known as:  NORCO/VICODIN  Take 1 tablet by mouth every 6 (six) hours as needed for moderate pain.     OVER THE COUNTER MEDICATION  Take 1 capsule by mouth daily as needed (erectile dysfunction). X AGAIN     penicillin v potassium 500 MG tablet  Commonly known as:  VEETID  Take 500 mg by mouth 2 (two) times daily.     pyridOXINE 100 MG tablet  Commonly known as:  VITAMIN B-6  Take 100 mg by mouth every morning.     rosuvastatin 20 MG tablet  Commonly known as:  CRESTOR  Take 1 tablet (20 mg total) by mouth daily.     saccharomyces boulardii 250 MG capsule  Commonly known as:  FLORASTOR  Take 1 capsule (250 mg total) by mouth 2 (two) times daily.       Follow-up Information    Follow up with [facility]    .   Contact information:   [address] [place]  [address] [phone]  Follow up with Candee Furbish, MD In 3 weeks.   Specialty:  Cardiology   Contact information:   [address] [address] [phone]       Follow up with Gearlean Alf, MD.   Specialty:  Orthopedic Surgery   Why:  RETURN to [facility] Sunday night    Contact information:   [address] [place] [address] [place] [address] [phone]        The results of significant diagnostics from this hospitalization (including imaging, microbiology, ancillary and laboratory) are listed below for reference.    Significant Diagnostic Studies: Mr Foot Right W Wo Contrast  09/30/2014   CLINICAL DATA:  Great toe osteomyelitis. Neuropathy in the RIGHT foot. Infection of the great toe.  EXAM: MRI OF THE RIGHT FOREFOOT WITHOUT AND WITH CONTRAST  TECHNIQUE: Multiplanar, multisequence MR imaging was performed both before and after administration of intravenous contrast.  CONTRAST:  65mL MULTIHANCE GADOBENATE DIMEGLUMINE 529 MG/ML IV SOLN  COMPARISON:  Radiographs 09/05/2014.  FINDINGS: There is osteomyelitis of the phalanges of the great toe. This affects both the proximal and distal phalanges, with destructive changes of the great toe IP joint consistent with septic arthritis. Cavitation of the distal proximal phalanx of the great toe is present. The first MTP joint appears intact with a small reactive effusion. There is no soft tissue abscess however there is likely bony sequestrum in the distal terminal phalanx associated with septic arthritis and osteomyelitis. Cellulitis of the forefoot is present. Myopathy of the foot is present with fatty atrophy, edema, and mild enhancement most commonly seen in diabetics. Ulceration is present in the superficial medial great toe.  IMPRESSION: Medial great toe ulceration with septic IP joint and osteomyelitis of both proximal and terminal phalanx of the great toe.   Electronically Signed   By: Dereck Ligas M.D.   On: 09/30/2014 11:48    Microbiology: No results found for this or any previous visit (from the past 240 hour(s)).    Labs: Basic Metabolic Panel:  Recent Labs Lab 10/17/14 1134 10/18/14 0547  NA 140 139  K 4.5 3.5  CL 102 102  CO2 29 26  GLUCOSE 98 97  BUN 26* 21  CREATININE 1.94* 1.78*  CALCIUM 9.5 9.0   Liver Function Tests: No results for input(s): AST, ALT, ALKPHOS, BILITOT, PROT, ALBUMIN in the last 168 hours. No results for input(s): LIPASE, AMYLASE in the last 168 hours. No results for input(s): AMMONIA in the last 168 hours. CBC:  Recent Labs Lab 10/17/14 1134 10/18/14 0547  WBC 5.8 4.5  NEUTROABS 2.7  --   HGB 13.4 13.0  HCT 40.1 39.4  MCV 96.2 95.2  PLT 241 245   Cardiac Enzymes:  Recent Labs Lab 10/17/14 1820 10/17/14 2340 10/18/14 0547  TROPONINI <0.03 <0.03 <0.03   BNP: BNP (last 3 results) No results for input(s): BNP in the last 8760 hours.  ProBNP (last 3 results) No results for input(s): PROBNP in the last 8760 hours.  CBG: No results for input(s): GLUCAP in the last 168 hours.  Time coordinating discharge: 45 minutes  Signed:  Buffy Ehler  [facility] 10/18/2014, 12:13 PM

## 2014-10-18 NOTE — Progress Notes (Signed)
Subjective:   Procedure(s) (LRB): AMPUTATION DIGIT (Right) Patient reports pain as 0 on 0-10 scale.Patient insists that he be discharged by hi Md because his father is very ill.I did inform his Md who will take care of this issue. He has been cleared by Dr. Wynonia Lawman in regards to surgery on Monday.    Objective: Vital signs in last 24 hours: Temp:  [97.5 F (36.4 C)-98.2 F (36.8 C)] 97.5 F (36.4 C) (03/12 0532) Pulse Rate:  [43-60] 48 (03/12 0532) Resp:  [17-20] 18 (03/12 0532) BP: (126-145)/(99-116) 144/115 mmHg (03/12 0532) SpO2:  [97 %-100 %] 99 % (03/12 0532) Weight:  [99.655 kg (219 lb 11.2 oz)] 99.655 kg (219 lb 11.2 oz) (03/11 1800)  Intake/Output from previous day: 03/11 0701 - 03/12 0700 In: 400 [P.O.:400] Out: 1025 [Urine:1025] Intake/Output this shift:     Recent Labs  10/17/14 1134 10/18/14 0547  HGB 13.4 13.0    Recent Labs  10/17/14 1134 10/18/14 0547  WBC 5.8 4.5  RBC 4.17* 4.14*  HCT 40.1 39.4  PLT 241 245    Recent Labs  10/17/14 1134 10/18/14 0547  NA 140 139  K 4.5 3.5  CL 102 102  CO2 29 26  BUN 26* 21  CREATININE 1.94* 1.78*  GLUCOSE 98 97  CALCIUM 9.5 9.0    Recent Labs  10/18/14 0004  INR 1.06    Compartment soft  Assessment/Plan:   Procedure(s) (LRB): AMPUTATION DIGIT (Right) Discharge home with home health by his MD at patients request.  Ulah Olmo A 10/18/2014, 9:44 AM

## 2014-10-18 NOTE — Progress Notes (Signed)
ANTICOAGULATION CONSULT NOTE - Initial Consult  Pharmacy Consult for Apixaban Indication: atrial fibrillation  Allergies  Allergen Reactions  . Oxycodone Itching and Palpitations    Patient Measurements: Height: 6' (182.9 cm) Weight: 219 lb 11.2 oz (99.655 kg) IBW/kg (Calculated) : 77.6  Vital Signs: Temp: 97.5 F (36.4 C) (03/12 0532) Temp Source: Oral (03/12 0532) BP: 144/115 mmHg (03/12 0532) Pulse Rate: 48 (03/12 0532)  Labs:  Recent Labs  10/17/14 1134 10/17/14 1820 10/17/14 2340 10/18/14 0001 10/18/14 0004 10/18/14 0547  HGB 13.4  --   --   --   --  13.0  HCT 40.1  --   --   --   --  39.4  PLT 241  --   --   --   --  245  LABPROT  --   --   --   --  13.9  --   INR  --   --   --   --  1.06  --   HEPARINUNFRC  --   --   --  0.22*  --   --   CREATININE 1.94*  --   --   --   --  1.78*  TROPONINI  --  <0.03 <0.03  --   --  <0.03    Estimated Creatinine Clearance: 58 mL/min (by C-G formula based on Cr of 1.78).   Medical History: Past Medical History  Diagnosis Date  . Hypertension   . Diabetes mellitus   . Bell's palsy   . Cancer     Mantle Cell Cancer- S/P stem cell transplant  . History of splenectomy   . Polysubstance abuse   . Neuropathy     right leg due to football injury  . CAD (coronary artery disease)   . Sinus bradycardia   . CKD (chronic kidney disease) stage 3, GFR 30-59 ml/min     Medications:  Scheduled:  . amLODipine  10 mg Oral q morning - 10a  . apixaban  5 mg Oral NOW  . docusate sodium  100 mg Oral BID  . gabapentin  600 mg Oral BID  . piperacillin-tazobactam (ZOSYN)  IV  3.375 g Intravenous Q8H  . pyridOXINE  100 mg Oral q morning - 10a  . rosuvastatin  20 mg Oral Daily  . vancomycin  1,250 mg Intravenous Q12H   Infusions:  . sodium chloride 100 mL/hr at 10/17/14 1809   PRN: acetaminophen **OR** acetaminophen, bisacodyl, HYDROcodone-acetaminophen, ondansetron **OR** ondansetron (ZOFRAN) IV, polyethylene  glycol  Assessment: 53 YOM presents with toe swelling and pain d/t chronic toe ulcer/osteomyelitis. He was found to be in aflutter and complete heart block. Pharmacy asked to dose heparin gtt. CHADs2vasc = 2-3, HAS BLED score = 1. Per Cardiology, once patient has surgery on foot, will be discharged on apixaban and f/u in 3-4 weeks to consider cardioversion or ablation of aflutter.  Pharmacy is now consulted to dose apixaban as patient is being discharged to attend to a family emergency.  Meets criteria for standard dose apixaban: Age < 80 Weight > 60kg SCr < 2.5  Goal of Therapy:  Full dose anticoagulation Monitor platelets by anticoagulation protocol: Yes   Plan:   D/C IV heparin  Begin apixaban 5mg  PO bid  MD to instruct when to hold apixaban prior to surgery  Peggyann Juba, PharmD, BCPS Pager: [phone] 10/18/2014,10:31 AM

## 2014-10-18 NOTE — Progress Notes (Signed)
Subjective:  No complaints of shortness of breath or chest pain  Objective:  Vital Signs in the last 24 hours: BP 144/115 mmHg  Pulse 48  Temp(Src) 97.5 F (36.4 C) (Oral)  Resp 18  Ht 6' (1.829 m)  Wt 99.655 kg (219 lb 11.2 oz)  BMI 29.79 kg/m2  SpO2 99%  Physical Exam: Pleasant black male in no acute distress Lungs:  Clear  Cardiac: Slow somewhat irregular rhythm, normal S1 and S2, no S3 Extremities:   right toe bandaged   Intake/Output from previous day: 03/11 0701 - 03/12 0700 In: 400 [P.O.:400] Out: 1025 [Urine:1025] Weight Filed Weights   10/17/14 1800  Weight: 99.655 kg (219 lb 11.2 oz)    Lab Results: Basic Metabolic Panel:  Recent Labs  10/17/14 1134 10/18/14 0547  NA 140 139  K 4.5 3.5  CL 102 102  CO2 29 26  GLUCOSE 98 97  BUN 26* 21  CREATININE 1.94* 1.78*    CBC:  Recent Labs  10/17/14 1134 10/18/14 0547  WBC 5.8 4.5  NEUTROABS 2.7  --   HGB 13.4 13.0  HCT 40.1 39.4  MCV 96.2 95.2  PLT 241 245   Telemetry: Atrial flutter with slow ventricular response  Assessment/Plan:  1.  Atrial flutter with slow response asymptomatic 2.  Chronic kidney disease stage III 3.  Previous history of mantle cell lymphoma with stem cell transplant   recommendations:  Awaiting surgery on foot.  Once he has had his surgery will need to be discharged on Eliquis with follow-up with Dr. Marlou Porch in 3-4 weeks to consider cardioversion or ablation of the flutter.   [name]  MD [facility]  10/18/2014, 9:16 AM

## 2014-10-18 NOTE — Progress Notes (Addendum)
CARE MANAGEMENT NOTE 10/18/2014  Patient:  Cory Marks, Cory Marks   Account Number:  [id number]  Date Initiated:  10/18/2014  Documentation initiated by:  [facility]  Subjective/Objective Assessment:   Atrial flutter, Osteomyelitis of toe of right foot     Action/Plan:   Anticipated DC Date:  10/18/2014   Anticipated DC Plan:  [place]  CM consult      [facility] Choice  HOME HEALTH   Choice offered to / List presented to:  C-1 Patient        [place] arranged  HH-1 RN      [place].   Status of service:  Completed, signed off Medicare Important Message given?  NA - LOS <3 / Initial given by admissions (If response is "NO", the following Medicare IM given date fields will be blank) Date Medicare IM given:   Medicare IM given by:   Date Additional Medicare IM given:   Additional Medicare IM given by:    Discharge Disposition:  [place]  Per UR Regulation:    If discussed at Long Length of Stay Meetings, dates discussed:    Comments:   10/18/2014 12:14 PM  NCM spoke to pt and offered choice for [facility]. Pt requesting [facility] for HH. Provided pt with Eliquis 30 day free trial card. Jonnie Finner RN CCM Case Mgmt phone [phone]

## 2014-10-19 ENCOUNTER — Inpatient Hospital Stay (HOSPITAL_COMMUNITY)
Admission: AD | Admit: 2014-10-19 | Discharge: 2014-10-21 | DRG: 504 | Disposition: A | Discharge status: Home / Self Care | Payer: Medicare HMO | Source: Ambulatory Visit | Attending: Internal Medicine | Admitting: Internal Medicine

## 2014-10-19 ENCOUNTER — Encounter (HOSPITAL_COMMUNITY): Payer: Self-pay | Admitting: Internal Medicine

## 2014-10-19 DIAGNOSIS — Z800 Family history of malignant neoplasm of digestive organs: Secondary | ICD-10-CM | POA: Diagnosis not present

## 2014-10-19 DIAGNOSIS — I4892 Unspecified atrial flutter: Secondary | ICD-10-CM | POA: Diagnosis present

## 2014-10-19 DIAGNOSIS — Z7901 Long term (current) use of anticoagulants: Secondary | ICD-10-CM

## 2014-10-19 DIAGNOSIS — D649 Anemia, unspecified: Secondary | ICD-10-CM | POA: Diagnosis present

## 2014-10-19 DIAGNOSIS — Z9081 Acquired absence of spleen: Secondary | ICD-10-CM | POA: Diagnosis present

## 2014-10-19 DIAGNOSIS — M009 Pyogenic arthritis, unspecified: Secondary | ICD-10-CM | POA: Diagnosis present

## 2014-10-19 DIAGNOSIS — Z9481 Bone marrow transplant status: Secondary | ICD-10-CM

## 2014-10-19 DIAGNOSIS — E119 Type 2 diabetes mellitus without complications: Secondary | ICD-10-CM | POA: Diagnosis present

## 2014-10-19 DIAGNOSIS — E669 Obesity, unspecified: Secondary | ICD-10-CM | POA: Diagnosis present

## 2014-10-19 DIAGNOSIS — Z79899 Other long term (current) drug therapy: Secondary | ICD-10-CM

## 2014-10-19 DIAGNOSIS — Z9484 Stem cells transplant status: Secondary | ICD-10-CM

## 2014-10-19 DIAGNOSIS — M869 Osteomyelitis, unspecified: Principal | ICD-10-CM | POA: Diagnosis present

## 2014-10-19 DIAGNOSIS — Z6830 Body mass index (BMI) 30.0-30.9, adult: Secondary | ICD-10-CM

## 2014-10-19 DIAGNOSIS — L03115 Cellulitis of right lower limb: Secondary | ICD-10-CM | POA: Diagnosis present

## 2014-10-19 DIAGNOSIS — N183 Chronic kidney disease, stage 3 unspecified: Secondary | ICD-10-CM | POA: Diagnosis present

## 2014-10-19 DIAGNOSIS — Z833 Family history of diabetes mellitus: Secondary | ICD-10-CM | POA: Diagnosis not present

## 2014-10-19 DIAGNOSIS — L97519 Non-pressure chronic ulcer of other part of right foot with unspecified severity: Secondary | ICD-10-CM | POA: Diagnosis present

## 2014-10-19 DIAGNOSIS — Z8572 Personal history of non-Hodgkin lymphomas: Secondary | ICD-10-CM | POA: Diagnosis not present

## 2014-10-19 DIAGNOSIS — E785 Hyperlipidemia, unspecified: Secondary | ICD-10-CM | POA: Diagnosis present

## 2014-10-19 DIAGNOSIS — I10 Essential (primary) hypertension: Secondary | ICD-10-CM | POA: Diagnosis present

## 2014-10-19 DIAGNOSIS — Z8249 Family history of ischemic heart disease and other diseases of the circulatory system: Secondary | ICD-10-CM | POA: Diagnosis not present

## 2014-10-19 DIAGNOSIS — G8929 Other chronic pain: Secondary | ICD-10-CM | POA: Diagnosis present

## 2014-10-19 DIAGNOSIS — N179 Acute kidney failure, unspecified: Secondary | ICD-10-CM | POA: Diagnosis not present

## 2014-10-19 DIAGNOSIS — I483 Typical atrial flutter: Secondary | ICD-10-CM

## 2014-10-19 DIAGNOSIS — I129 Hypertensive chronic kidney disease with stage 1 through stage 4 chronic kidney disease, or unspecified chronic kidney disease: Secondary | ICD-10-CM | POA: Diagnosis present

## 2014-10-19 DIAGNOSIS — E118 Type 2 diabetes mellitus with unspecified complications: Secondary | ICD-10-CM | POA: Diagnosis present

## 2014-10-19 DIAGNOSIS — M21379 Foot drop, unspecified foot: Secondary | ICD-10-CM | POA: Diagnosis present

## 2014-10-19 DIAGNOSIS — Z79891 Long term (current) use of opiate analgesic: Secondary | ICD-10-CM | POA: Diagnosis not present

## 2014-10-19 DIAGNOSIS — Z792 Long term (current) use of antibiotics: Secondary | ICD-10-CM

## 2014-10-19 DIAGNOSIS — Z885 Allergy status to narcotic agent status: Secondary | ICD-10-CM | POA: Diagnosis not present

## 2014-10-19 DIAGNOSIS — G629 Polyneuropathy, unspecified: Secondary | ICD-10-CM | POA: Diagnosis present

## 2014-10-19 DIAGNOSIS — I2510 Atherosclerotic heart disease of native coronary artery without angina pectoris: Secondary | ICD-10-CM | POA: Diagnosis present

## 2014-10-19 DIAGNOSIS — I5022 Chronic systolic (congestive) heart failure: Secondary | ICD-10-CM

## 2014-10-19 DIAGNOSIS — M7989 Other specified soft tissue disorders: Secondary | ICD-10-CM | POA: Diagnosis present

## 2014-10-19 DIAGNOSIS — M86271 Subacute osteomyelitis, right ankle and foot: Secondary | ICD-10-CM | POA: Diagnosis present

## 2014-10-19 DIAGNOSIS — Z8679 Personal history of other diseases of the circulatory system: Secondary | ICD-10-CM

## 2014-10-19 DIAGNOSIS — Z87898 Personal history of other specified conditions: Secondary | ICD-10-CM

## 2014-10-19 MED ORDER — LORATADINE 10 MG PO TABS
10.0000 mg | ORAL_TABLET | Freq: Every day | ORAL | Status: DC
  Administered 2014-10-20 – 2014-10-21 (×2): 10 mg via ORAL
  Filled 2014-10-19 (×2): qty 1

## 2014-10-19 MED ORDER — HEPARIN (PORCINE) IN NACL 100-0.45 UNIT/ML-% IJ SOLN
1600.0000 [IU]/h | INTRAMUSCULAR | Status: DC
  Administered 2014-10-20: 1600 [IU]/h via INTRAVENOUS
  Filled 2014-10-19 (×2): qty 250

## 2014-10-19 MED ORDER — AMLODIPINE BESYLATE 10 MG PO TABS
10.0000 mg | ORAL_TABLET | Freq: Every morning | ORAL | Status: DC
  Administered 2014-10-19 – 2014-10-21 (×2): 10 mg via ORAL
  Filled 2014-10-19 (×2): qty 1

## 2014-10-19 MED ORDER — ONDANSETRON HCL 4 MG/2ML IJ SOLN: 4.0000 mg | Freq: Four times a day (QID) | INTRAMUSCULAR | Status: DC | PRN

## 2014-10-19 MED ORDER — ONDANSETRON HCL 4 MG PO TABS: 4.0000 mg | ORAL_TABLET | Freq: Four times a day (QID) | ORAL | Status: DC | PRN

## 2014-10-19 MED ORDER — ROSUVASTATIN CALCIUM 20 MG PO TABS
20.0000 mg | ORAL_TABLET | Freq: Every day | ORAL | Status: DC
  Administered 2014-10-19 – 2014-10-21 (×3): 20 mg via ORAL
  Filled 2014-10-19 (×3): qty 1

## 2014-10-19 MED ORDER — ACETAMINOPHEN 325 MG PO TABS
650.0000 mg | ORAL_TABLET | Freq: Four times a day (QID) | ORAL | Status: DC | PRN
  Administered 2014-10-21: 650 mg via ORAL
  Filled 2014-10-19: qty 2

## 2014-10-19 MED ORDER — HYDROCODONE-ACETAMINOPHEN 5-325 MG PO TABS
1.0000 | ORAL_TABLET | ORAL | Status: DC | PRN
  Administered 2014-10-19: 1 via ORAL
  Administered 2014-10-21: 2 via ORAL
  Filled 2014-10-19: qty 1
  Filled 2014-10-19 (×2): qty 2

## 2014-10-19 MED ORDER — ACETAMINOPHEN 650 MG RE SUPP: 650.0000 mg | Freq: Four times a day (QID) | RECTAL | Status: DC | PRN

## 2014-10-19 MED ORDER — DOCUSATE SODIUM 100 MG PO CAPS: 100.0000 mg | ORAL_CAPSULE | Freq: Every day | ORAL | Status: DC | PRN

## 2014-10-19 MED ORDER — HYDROCODONE-ACETAMINOPHEN 5-325 MG PO TABS: 1.0000 | ORAL_TABLET | Freq: Four times a day (QID) | ORAL | Status: DC | PRN

## 2014-10-19 MED ORDER — INSULIN ASPART 100 UNIT/ML ~~LOC~~ SOLN
0.0000 [IU] | SUBCUTANEOUS | Status: DC
  Administered 2014-10-20: 2 [IU] via SUBCUTANEOUS

## 2014-10-19 MED ORDER — PIPERACILLIN-TAZOBACTAM 3.375 G IVPB
3.3750 g | Freq: Three times a day (TID) | INTRAVENOUS | Status: DC
  Administered 2014-10-20 – 2014-10-21 (×6): 3.375 g via INTRAVENOUS
  Filled 2014-10-19 (×7): qty 50

## 2014-10-19 MED ORDER — SODIUM CHLORIDE 0.9 % IJ SOLN
3.0000 mL | Freq: Two times a day (BID) | INTRAMUSCULAR | Status: DC
  Administered 2014-10-19 – 2014-10-21 (×3): 3 mL via INTRAVENOUS

## 2014-10-19 MED ORDER — DOCUSATE SODIUM 100 MG PO CAPS
100.0000 mg | ORAL_CAPSULE | Freq: Two times a day (BID) | ORAL | Status: DC
  Administered 2014-10-19 – 2014-10-21 (×4): 100 mg via ORAL
  Filled 2014-10-19 (×6): qty 1

## 2014-10-19 MED ORDER — VANCOMYCIN HCL 10 G IV SOLR
1250.0000 mg | INTRAVENOUS | Status: DC
  Administered 2014-10-20 (×2): 1250 mg via INTRAVENOUS
  Filled 2014-10-19 (×3): qty 1250

## 2014-10-19 MED ORDER — GABAPENTIN 300 MG PO CAPS
600.0000 mg | ORAL_CAPSULE | Freq: Two times a day (BID) | ORAL | Status: DC
  Administered 2014-10-19 – 2014-10-21 (×4): 600 mg via ORAL
  Filled 2014-10-19 (×5): qty 2

## 2014-10-19 MED ORDER — LIDOCAINE-PRILOCAINE 2.5-2.5 % EX CREA
TOPICAL_CREAM | Freq: Once | CUTANEOUS | Status: AC
  Administered 2014-10-20: via TOPICAL
  Filled 2014-10-19: qty 5

## 2014-10-19 NOTE — Progress Notes (Signed)
ANTIBIOTIC CONSULT NOTE - INITIAL  Pharmacy Consult for Vancomycin Indication: Osteomyelitis  Allergies  Allergen Reactions  . Oxycodone Itching and Palpitations    Patient Measurements: Height: 6' (182.9 cm) Weight: 222 lb 11.2 oz (101.016 kg) IBW/kg (Calculated) : 77.6  Vital Signs: Temp: 97.8 F (36.6 C) (03/13 2313) Temp Source: Oral (03/13 2313) BP: 127/89 mmHg (03/13 2313) Pulse Rate: 58 (03/13 2313) Intake/Output from previous day:   Intake/Output from this shift:    Labs:  Recent Labs  10/17/14 1134 10/18/14 0547  WBC 5.8 4.5  HGB 13.4 13.0  PLT 241 245  CREATININE 1.94* 1.78*   Estimated Creatinine Clearance: 58.4 mL/min (by C-G formula based on Cr of 1.78). No results for input(s): VANCOTROUGH, VANCOPEAK, VANCORANDOM, GENTTROUGH, GENTPEAK, GENTRANDOM, TOBRATROUGH, TOBRAPEAK, TOBRARND, AMIKACINPEAK, AMIKACINTROU, AMIKACIN in the last 72 hours.   Microbiology: No results found for this or any previous visit (from the past 720 hour(s)).  Medical History: Past Medical History  Diagnosis Date  . Hypertension   . Diabetes mellitus   . Bell's palsy   . Cancer     Mantle Cell Cancer- S/P stem cell transplant  . History of splenectomy   . Polysubstance abuse   . Neuropathy     right leg due to football injury  . CAD (coronary artery disease)   . Sinus bradycardia   . CKD (chronic kidney disease) stage 3, GFR 30-59 ml/min     Medications:  Scheduled:  . amLODipine  10 mg Oral q morning - 10a  . docusate sodium  100 mg Oral BID  . gabapentin  600 mg Oral BID  . [START ON 10/20/2014] insulin aspart  0-9 Units Subcutaneous 6 times per day  . [START ON 10/20/2014] lidocaine-prilocaine   Topical Once  . [START ON 10/20/2014] loratadine  10 mg Oral Daily  . piperacillin-tazobactam (ZOSYN)  IV  3.375 g Intravenous Q8H  . rosuvastatin  20 mg Oral Daily  . sodium chloride  3 mL Intravenous Q12H  . [START ON 10/20/2014] vancomycin  1,250 mg Intravenous Q24H    Infusions:  . heparin     Assessment:  54 yr male known to pharmacy from vancomycin dosing during hospitalization 3/11-3/12  Returns for scheduled toe amputation 3/14  Pharmacy consulted to dose Vancomycin for the wound infection.  MD has also ordered Zosyn 3.375gm IV q8h  Scr = 1.78 with CrCl (n) - 48 ml/min  Goal of Therapy:  Vancomycin trough level 15-20 mcg/ml  Plan:  Measure antibiotic drug levels at steady state Follow up culture results Vancomycin 1250mg  IV q24h Zosyn 3.375gm IV q8h (per MD)  Zaid Tomes, Toribio Harbour, PharmD 10/19/2014,11:51 PM

## 2014-10-19 NOTE — Progress Notes (Signed)
ANTICOAGULATION CONSULT NOTE - Initial Consult  Pharmacy Consult for Heparin Indication: Afib - bridge therapy (Eliquis PTA)  Allergies  Allergen Reactions  . Oxycodone Itching and Palpitations    Patient Measurements: Height: 6' (182.9 cm) Weight: 222 lb 11.2 oz (101.016 kg) IBW/kg (Calculated) : 77.6 Heparin Dosing Weight: 98 kg  Vital Signs: Temp: 97.8 F (36.6 C) (03/13 2313) Temp Source: Oral (03/13 2313) BP: 127/89 mmHg (03/13 2313) Pulse Rate: 58 (03/13 2313)  Labs:  Recent Labs  10/17/14 1134 10/17/14 1820 10/17/14 2340 10/18/14 0001 10/18/14 0004 10/18/14 0547  HGB 13.4  --   --   --   --  13.0  HCT 40.1  --   --   --   --  39.4  PLT 241  --   --   --   --  245  LABPROT  --   --   --   --  13.9  --   INR  --   --   --   --  1.06  --   HEPARINUNFRC  --   --   --  0.22*  --   --   CREATININE 1.94*  --   --   --   --  1.78*  TROPONINI  --  <0.03 <0.03  --   --  <0.03    Estimated Creatinine Clearance: 58.4 mL/min (by C-G formula based on Cr of 1.78).   Medical History: Past Medical History  Diagnosis Date  . Hypertension   . Diabetes mellitus   . Bell's palsy   . Cancer     Mantle Cell Cancer- S/P stem cell transplant  . History of splenectomy   . Polysubstance abuse   . Neuropathy     right leg due to football injury  . CAD (coronary artery disease)   . Sinus bradycardia   . CKD (chronic kidney disease) stage 3, GFR 30-59 ml/min     Medications:  Scheduled:  . amLODipine  10 mg Oral q morning - 10a  . docusate sodium  100 mg Oral BID  . gabapentin  600 mg Oral BID  . [START ON 10/20/2014] insulin aspart  0-9 Units Subcutaneous 6 times per day  . [START ON 10/20/2014] loratadine  10 mg Oral Daily  . piperacillin-tazobactam (ZOSYN)  IV  3.375 g Intravenous Q8H  . rosuvastatin  20 mg Oral Daily  . sodium chloride  3 mL Intravenous Q12H   Infusions:    Assessment:  53 YOM presented 3/11 with toe swelling and pain d/t chronic toe  ulcer/osteomyelitis. He was found to be in aflutter and complete heart block. Pharmacy asked to dose heparin gtt. CHADs2vasc = 2-3, HAS BLED score = 1  Plan for toe amputation on 3/14  Patient discharged on 3/12 and readmitted evening of 3/13 per MD plan  Patient on Eliquis 5mg  BID - LD taken 3/13 AM  Pharmacy consulted to begin IV heparin as bridging therapy in anticipation of surgery  Goal of Therapy:  Heparin level 0.3-0.7 units/ml Monitor platelets by anticoagulation protocol: Yes   Plan:   Begin IV heparin infusion @ 1600 units/hr  Check heparin level 6 hr after heparin started  Follow heparin level and CBC daily while on heparin gtt  Kiana Hollar, Toribio Harbour, PharmD 10/19/2014,11:31 PM

## 2014-10-19 NOTE — H&P (Signed)
PCP: Lovina Reach, MD  cardiologist [facility] [place]  Chief Complaint:  Great toe infection HPI: Cory Marks is a 54 y.o. male   has a past medical history of Hypertension; Diabetes mellitus; Bell's palsy; Cancer; History of splenectomy; Polysubstance abuse; Neuropathy; CAD (coronary artery disease); Sinus bradycardia; and CKD (chronic kidney disease) stage 3, GFR 30-59 ml/min. 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 Patient has ongoing neuropathy and foot drop after football injury. He has chronic swelling in the leg. Patient's prior to this is been followed by wound care center. Patient had an MRI of his turn down February 22 that showed osteomyelitis decision was made to undergo amputation of the great toe and the right foot. Patient was admitted for IV antibiotics and orthopedics consultation for toe amputation. While in emergency department he was noted to have AV flatter.  During his admission he was seen by cardiology and was cleared for proceeding to OR. He supposed to follow-up with cardiology after discharge. Patient has extensive history including atrial flutter, chronic bradycardia, he is currently not on any beta blockers.  Prior to discharge echogram was done showing EF 40-45 %  with mild diffuse hypokinesis.   Patient had a family situation where his father was on deathbed. He requested discharge to home. Patient was discharged to home on  apixaban last dose was AM 10/19/14 and and clindamycin. Patient was instructed to present back to [place] along on the 13th at night which he did. Patient denies any recent fevers or chills as some mild right toe pain but otherwise unremarkable. No chest pains no presyncope. Patient denies any fevers or chills. He reports taking all his medications.   Hospitalist was called for admission for osteomyelitis  Review of Systems:    Pertinent  positives include: Right toe discomfort mild  Constitutional:  No weight loss, night sweats, Fevers, chills, fatigue, weight loss  HEENT:  No headaches, Difficulty swallowing,Tooth/dental problems,Sore throat,  No sneezing, itching, ear ache, nasal congestion, post nasal drip,  Cardio-vascular:  No chest pain, Orthopnea, PND, anasarca, dizziness, palpitations.no Bilateral lower extremity swelling  GI:  No heartburn, indigestion, abdominal pain, nausea, vomiting, diarrhea, change in bowel habits, loss of appetite, melena, blood in stool, hematemesis Resp:  no shortness of breath at rest. No dyspnea on exertion, No excess mucus, no productive cough, No non-productive cough, No coughing up of blood.No change in color of mucus.No wheezing. Skin:  no rash or lesions. No jaundice GU:  no dysuria, change in color of urine, no urgency or frequency. No straining to urinate.  No flank pain.  Musculoskeletal:  No joint pain or no joint swelling. No decreased range of motion. No back pain.  Psych:  No change in mood or affect. No depression or anxiety. No memory loss.  Neuro: no localizing neurological complaints, no tingling, no weakness, no double vision, no gait abnormality, no slurred speech, no confusion  Otherwise ROS are negative except for above, 10 systems were reviewed  Past Medical History: Past Medical History  Diagnosis Date  . Hypertension   . Diabetes mellitus   . Bell's palsy   . Cancer     Mantle Cell Cancer- S/P stem cell transplant  . History of splenectomy   . Polysubstance abuse   . Neuropathy     right leg due to football injury  . CAD (coronary artery disease)   . Sinus bradycardia   .  CKD (chronic kidney disease) stage 3, GFR 30-59 ml/min    Past Surgical History  Procedure Laterality Date  . Splenectomy, total      mantle cell lymphoma  . Edentulation N/A y-2    prior to transplant     Medications: Prior to Admission medications   Medication Sig Start  Date End Date Taking? Authorizing Provider  amLODipine (NORVASC) 10 MG tablet Take 1 tablet (10 mg total) by mouth every morning. 10/16/14  Yes Leana Gamer, MD  apixaban (ELIQUIS) 5 MG TABS tablet Take 1 tablet (5 mg total) by mouth 2 (two) times daily. 10/18/14  Yes Janece Canterbury, MD  cephALEXin (KEFLEX) 500 MG capsule Take 500 mg by mouth 4 (four) times daily.   Yes Historical Provider, MD  clindamycin (CLEOCIN) 300 MG capsule Take 1 capsule (300 mg total) by mouth 4 (four) times daily. 10/18/14  Yes Janece Canterbury, MD  gabapentin (NEURONTIN) 300 MG capsule Take 2 capsules (600 mg total) by mouth 2 (two) times daily. 10/18/14  Yes Janece Canterbury, MD  penicillin v potassium (VEETID) 500 MG tablet Take 500 mg by mouth 2 (two) times daily.   Yes Historical Provider, MD  pyridOXINE (VITAMIN B-6) 100 MG tablet Take 100 mg by mouth every morning.    Yes Historical Provider, MD  rosuvastatin (CRESTOR) 20 MG tablet Take 1 tablet (20 mg total) by mouth daily. 10/16/14  Yes Leana Gamer, MD  saccharomyces boulardii (FLORASTOR) 250 MG capsule Take 1 capsule (250 mg total) by mouth 2 (two) times daily. 10/18/14  Yes Janece Canterbury, MD  traMADol (ULTRAM) 50 MG tablet Take 50 mg by mouth every 6 (six) hours as needed for severe pain.   Yes Historical Provider, MD    Allergies:   Allergies  Allergen Reactions  . Oxycodone Itching and Palpitations    Social History:  Ambulatory  Independently  Lives at home With family     reports that he has never smoked. He does not have any smokeless tobacco history on file. He reports that he drinks about 0.6 oz of alcohol per week. He reports that he uses illicit drugs (Marijuana).    Family History: family history includes Cancer in his father; Diabetes in his father, mother, and sister; Heart disease in his father; Hypertension in his mother.    Physical Exam: No data found.   1. General:  in No Acute distress 2. Psychological: Alert and   Oriented 3. Head/ENT:   Moist   Mucous Membranes                          Head Non traumatic, neck supple                          Normal  Dentition 4. SKIN: normal  Skin turgor,  Skin clean Dry right great toe noticed to be swollen no erythema or warmth noted. There is a 1 cm ulcer on the side draining serosanguineous fluid.  5. Heart: slow but Regular rate and rhythm no Murmur, Rub or gallop 6. Lungs: Clear to auscultation bilaterally, no wheezes or crackles   7. Abdomen: Soft, non-tender, Non distended 8. Lower extremities: no clubbing, cyanosis, right leg edema patient states that his chronic 9. Neurologically Grossly intact, moving all 4 extremities equally 10. MSK: Normal range of motion  body mass index is unknown because there is no weight on file.   Labs on Admission:  No results found for this or any previous visit (from the past 24 hour(s)).     Lab Results  Component Value Date   HGBA1C 6.0* 09/05/2014    Estimated Creatinine Clearance: 58 mL/min (by C-G formula based on Cr of 1.78).  BNP (last 3 results) No results for input(s): PROBNP in the last 8760 hours.  Other results:    There were no vitals filed for this visit.   Cultures:    Component Value Date/Time   SDES BLOOD RIGHT FOREARM 07/20/2014 2315   SPECREQUEST BOTTLES DRAWN AEROBIC AND ANAEROBIC 5CC EA 07/20/2014 2315   CULT  07/20/2014 2315    NO GROWTH 5 DAYS Performed at [place] 07/27/2014 FINAL 07/20/2014 2315     Radiological Exams on Admission: No results found.  Chart has been reviewed  Assessment/Plan  54 year old gentleman with extensive history including  mantle cell lymphoma status post bone marrow transplant, diabetes diet controlled, chronic kidney disease, new diagnosis of atrial flutter, systolic dysfunction with EF 45% new diagnosis, presents for right to amputation secondary to osteomyelitis   Present on Admission:  . Osteomyelitis - as per for  PDX right toe amputation patient a she is scheduled for amputation on March 14  . HTN (hypertension) continue Norvasc given bradycardia would avoid rate affecting agents . Diabetes mellitus type 2, controlled, with complications diet-controlled will monitor and sliding scale while in-house  . CKD (chronic kidney disease) stage 3, GFR 30-59 ml/min stable continue to monitor repeat creatinine  . Osteomyelitis of toe of right foot - amputation tomorrow as per orthopedics for now will restart IV antibiotics including Zosyn and vancomycin as per prior notes  . Atrial flutter - heparin bridging postoperatively can resume apixiban   Prophylaxis: Heparin, Protonix  CODE STATUS:  FULL CODE   Other plan as per orders.  I have spent a total of 55 min on this admission  Cory Marks 10/19/2014, 10:55 PM  [facility]  Pager [phone]   after 2 AM please page floor coverage PA If 7AM-7PM, please contact the day team taking care of the patient  [email or web address]  Password [email or web address]

## 2014-10-20 ENCOUNTER — Encounter (HOSPITAL_COMMUNITY)
Admission: AD | Disposition: A | Discharge status: Home / Self Care | Payer: Self-pay | Source: Ambulatory Visit | Attending: Internal Medicine

## 2014-10-20 ENCOUNTER — Encounter: Payer: Self-pay | Admitting: Internal Medicine

## 2014-10-20 ENCOUNTER — Inpatient Hospital Stay (HOSPITAL_COMMUNITY): Payer: Medicare HMO | Admitting: Anesthesiology

## 2014-10-20 ENCOUNTER — Encounter (HOSPITAL_COMMUNITY): Payer: Self-pay | Admitting: General Practice

## 2014-10-20 DIAGNOSIS — I5022 Chronic systolic (congestive) heart failure: Secondary | ICD-10-CM

## 2014-10-20 HISTORY — PX: AMPUTATION: SHX166

## 2014-10-20 LAB — COMPREHENSIVE METABOLIC PANEL
ALT: 17 U/L (ref 0–53)
AST: 30 U/L (ref 0–37)
Albumin: 4 g/dL (ref 3.5–5.2)
Alkaline Phosphatase: 46 U/L (ref 39–117)
Anion gap: 9 (ref 5–15)
BILIRUBIN TOTAL: 0.7 mg/dL (ref 0.3–1.2)
BUN: 28 mg/dL — AB (ref 6–23)
CALCIUM: 8.8 mg/dL (ref 8.4–10.5)
CHLORIDE: 106 mmol/L (ref 96–112)
CO2: 27 mmol/L (ref 19–32)
CREATININE: 2.36 mg/dL — AB (ref 0.50–1.35)
GFR, EST AFRICAN AMERICAN: 34 mL/min — AB (ref >90)
GFR, EST NON AFRICAN AMERICAN: 30 mL/min — AB (ref >90)
GLUCOSE: 97 mg/dL (ref 70–99)
Potassium: 3.5 mmol/L (ref 3.5–5.1)
Sodium: 142 mmol/L (ref 135–145)
Total Protein: 6.1 g/dL (ref 6.0–8.3)

## 2014-10-20 LAB — CBC
HCT: 38.2 % — ABNORMAL LOW (ref 39.0–52.0)
HEMATOCRIT: 36.9 % — AB (ref 39.0–52.0)
HEMOGLOBIN: 12.1 g/dL — AB (ref 13.0–17.0)
HEMOGLOBIN: 12.6 g/dL — AB (ref 13.0–17.0)
MCH: 31.3 pg (ref 26.0–34.0)
MCH: 31.7 pg (ref 26.0–34.0)
MCHC: 32.8 g/dL (ref 30.0–36.0)
MCHC: 33 g/dL (ref 30.0–36.0)
MCV: 95.3 fL (ref 78.0–100.0)
MCV: 96 fL (ref 78.0–100.0)
Platelets: 227 10*3/uL (ref 150–400)
Platelets: 235 10*3/uL (ref 150–400)
RBC: 3.87 MIL/uL — ABNORMAL LOW (ref 4.22–5.81)
RBC: 3.98 MIL/uL — AB (ref 4.22–5.81)
RDW: 14.6 % (ref 11.5–15.5)
RDW: 14.6 % (ref 11.5–15.5)
WBC: 6.3 10*3/uL (ref 4.0–10.5)
WBC: 5.7 10*3/uL (ref 4.0–10.5)

## 2014-10-20 LAB — GLUCOSE, CAPILLARY
GLUCOSE-CAPILLARY: 152 mg/dL — AB (ref 70–99)
GLUCOSE-CAPILLARY: 88 mg/dL (ref 70–99)
GLUCOSE-CAPILLARY: 148 mg/dL — AB (ref 70–99)
Glucose-Capillary: 99 mg/dL (ref 70–99)
Glucose-Capillary: 88 mg/dL (ref 70–99)
Glucose-Capillary: 92 mg/dL (ref 70–99)

## 2014-10-20 LAB — HEPARIN LEVEL (UNFRACTIONATED): Heparin Unfractionated: 0.8 IU/mL — ABNORMAL HIGH (ref 0.30–0.70)

## 2014-10-20 LAB — APTT: aPTT: 107 seconds — ABNORMAL HIGH (ref 24–37)

## 2014-10-20 SURGERY — AMPUTATION DIGIT
Anesthesia: General | Site: Toe | Laterality: Right

## 2014-10-20 MED ORDER — SODIUM CHLORIDE 0.9 % IJ SOLN
10.0000 mL | INTRAMUSCULAR | Status: DC | PRN
  Administered 2014-10-20: 10 mL
  Administered 2014-10-20 (×2): 20 mL
  Administered 2014-10-21: 10 mL
  Filled 2014-10-20 (×3): qty 40

## 2014-10-20 MED ORDER — EPHEDRINE SULFATE 50 MG/ML IJ SOLN
INTRAMUSCULAR | Status: DC | PRN
  Administered 2014-10-20 (×2): 10 mg via INTRAVENOUS

## 2014-10-20 MED ORDER — MEPERIDINE HCL 50 MG/ML IJ SOLN: 6.2500 mg | INTRAMUSCULAR | Status: DC | PRN

## 2014-10-20 MED ORDER — LIDOCAINE HCL (CARDIAC) 20 MG/ML IV SOLN
INTRAVENOUS | Status: AC
  Filled 2014-10-20: qty 5

## 2014-10-20 MED ORDER — KCL IN DEXTROSE-NACL 10-5-0.45 MEQ/L-%-% IV SOLN
INTRAVENOUS | Status: DC
  Administered 2014-10-20: 14:00:00 via INTRAVENOUS
  Filled 2014-10-20: qty 1000

## 2014-10-20 MED ORDER — BUPIVACAINE HCL (PF) 0.25 % IJ SOLN
INTRAMUSCULAR | Status: AC
  Filled 2014-10-20: qty 30

## 2014-10-20 MED ORDER — HYDROMORPHONE HCL 1 MG/ML IJ SOLN
INTRAMUSCULAR | Status: AC
  Filled 2014-10-20: qty 1

## 2014-10-20 MED ORDER — PROPOFOL 10 MG/ML IV BOLUS
INTRAVENOUS | Status: DC | PRN
  Administered 2014-10-20: 200 mg via INTRAVENOUS
  Administered 2014-10-20: 50 mg via INTRAVENOUS

## 2014-10-20 MED ORDER — BUPIVACAINE HCL (PF) 0.25 % IJ SOLN: INTRAMUSCULAR | Status: DC | PRN

## 2014-10-20 MED ORDER — EPHEDRINE SULFATE 50 MG/ML IJ SOLN
INTRAMUSCULAR | Status: AC
  Filled 2014-10-20: qty 1

## 2014-10-20 MED ORDER — METOCLOPRAMIDE HCL 10 MG PO TABS: 5.0000 mg | ORAL_TABLET | Freq: Three times a day (TID) | ORAL | Status: DC | PRN

## 2014-10-20 MED ORDER — MIDAZOLAM HCL 5 MG/5ML IJ SOLN
INTRAMUSCULAR | Status: DC | PRN
  Administered 2014-10-20: 2 mg via INTRAVENOUS

## 2014-10-20 MED ORDER — LIDOCAINE HCL (CARDIAC) 20 MG/ML IV SOLN
INTRAVENOUS | Status: DC | PRN
  Administered 2014-10-20: 100 mg via INTRAVENOUS

## 2014-10-20 MED ORDER — PROMETHAZINE HCL 25 MG/ML IJ SOLN: 6.2500 mg | INTRAMUSCULAR | Status: DC | PRN

## 2014-10-20 MED ORDER — PROPOFOL 10 MG/ML IV BOLUS
INTRAVENOUS | Status: AC
  Filled 2014-10-20: qty 20

## 2014-10-20 MED ORDER — METHOCARBAMOL 500 MG PO TABS: 500.0000 mg | ORAL_TABLET | Freq: Four times a day (QID) | ORAL | Status: DC | PRN

## 2014-10-20 MED ORDER — SODIUM CHLORIDE 0.9 % IJ SOLN
INTRAMUSCULAR | Status: AC
Start: 2014-10-20 — End: 2014-10-20
  Filled 2014-10-20: qty 10

## 2014-10-20 MED ORDER — PIPERACILLIN-TAZOBACTAM 3.375 G IVPB
INTRAVENOUS | Status: AC
  Filled 2014-10-20: qty 50

## 2014-10-20 MED ORDER — LACTATED RINGERS IV SOLN: INTRAVENOUS | Status: DC

## 2014-10-20 MED ORDER — 0.9 % SODIUM CHLORIDE (POUR BTL) OPTIME
TOPICAL | Status: DC | PRN
  Administered 2014-10-20: 1000 mL

## 2014-10-20 MED ORDER — LACTATED RINGERS IV SOLN
INTRAVENOUS | Status: DC | PRN
  Administered 2014-10-20: 16:00:00 via INTRAVENOUS

## 2014-10-20 MED ORDER — MORPHINE SULFATE 2 MG/ML IJ SOLN
1.0000 mg | INTRAMUSCULAR | Status: DC | PRN
  Administered 2014-10-20 – 2014-10-21 (×3): 1 mg via INTRAVENOUS
  Filled 2014-10-20 (×3): qty 1

## 2014-10-20 MED ORDER — HYDROMORPHONE HCL 2 MG PO TABS
2.0000 mg | ORAL_TABLET | ORAL | Status: DC | PRN
  Administered 2014-10-20 – 2014-10-21 (×3): 4 mg via ORAL
  Administered 2014-10-21: 2 mg via ORAL
  Filled 2014-10-20 (×4): qty 2

## 2014-10-20 MED ORDER — METOCLOPRAMIDE HCL 5 MG/ML IJ SOLN: 5.0000 mg | Freq: Three times a day (TID) | INTRAMUSCULAR | Status: DC | PRN

## 2014-10-20 MED ORDER — FENTANYL CITRATE 0.05 MG/ML IJ SOLN
INTRAMUSCULAR | Status: AC
  Filled 2014-10-20: qty 2

## 2014-10-20 MED ORDER — METHOCARBAMOL 1000 MG/10ML IJ SOLN: 500.0000 mg | Freq: Four times a day (QID) | INTRAVENOUS | Status: DC | PRN

## 2014-10-20 MED ORDER — HYDROMORPHONE HCL 1 MG/ML IJ SOLN
0.2500 mg | INTRAMUSCULAR | Status: DC | PRN
  Administered 2014-10-20 (×2): 0.5 mg via INTRAVENOUS

## 2014-10-20 MED ORDER — ONDANSETRON HCL 4 MG PO TABS: 4.0000 mg | ORAL_TABLET | Freq: Four times a day (QID) | ORAL | Status: DC | PRN

## 2014-10-20 MED ORDER — FENTANYL CITRATE 0.05 MG/ML IJ SOLN
25.0000 ug | INTRAMUSCULAR | Status: DC | PRN
  Administered 2014-10-20 (×3): 50 ug via INTRAVENOUS

## 2014-10-20 MED ORDER — APIXABAN 5 MG PO TABS
5.0000 mg | ORAL_TABLET | Freq: Two times a day (BID) | ORAL | Status: DC
  Administered 2014-10-21: 5 mg via ORAL
  Filled 2014-10-20 (×2): qty 1

## 2014-10-20 MED ORDER — ENOXAPARIN SODIUM 40 MG/0.4ML ~~LOC~~ SOLN: 40.0000 mg | SUBCUTANEOUS | Status: DC

## 2014-10-20 MED ORDER — ONDANSETRON HCL 4 MG/2ML IJ SOLN: 4.0000 mg | Freq: Four times a day (QID) | INTRAMUSCULAR | Status: DC | PRN

## 2014-10-20 MED ORDER — ONDANSETRON HCL 4 MG/2ML IJ SOLN
INTRAMUSCULAR | Status: DC | PRN
  Administered 2014-10-20: 4 mg via INTRAVENOUS

## 2014-10-20 MED ORDER — ONDANSETRON HCL 4 MG/2ML IJ SOLN
INTRAMUSCULAR | Status: AC
  Filled 2014-10-20: qty 2

## 2014-10-20 MED ORDER — FENTANYL CITRATE 0.05 MG/ML IJ SOLN
INTRAMUSCULAR | Status: DC | PRN
  Administered 2014-10-20 (×2): 50 ug via INTRAVENOUS

## 2014-10-20 MED ORDER — MIDAZOLAM HCL 2 MG/2ML IJ SOLN
INTRAMUSCULAR | Status: AC
  Filled 2014-10-20: qty 2

## 2014-10-20 SURGICAL SUPPLY — 34 items
BAG ZIPLOCK 12X15 (MISCELLANEOUS) IMPLANT
BANDAGE ELASTIC 6 VELCRO ST LF (GAUZE/BANDAGES/DRESSINGS) ×3 IMPLANT
BLADE MIC 41X13 (BLADE) IMPLANT
BLADE MIC 41X13MM (BLADE)
BLADE SURG SZ10 CARB STEEL (BLADE) ×6 IMPLANT
BNDG GAUZE ELAST 4 BULKY (GAUZE/BANDAGES/DRESSINGS) IMPLANT
DRAIN PENROSE 18X1/2 LTX STRL (DRAIN) IMPLANT
DRAIN PENROSE 18X1/4 LTX STRL (WOUND CARE) IMPLANT
DRSG ADAPTIC 3X8 NADH LF (GAUZE/BANDAGES/DRESSINGS) ×3 IMPLANT
DRSG EMULSION OIL 3X16 NADH (GAUZE/BANDAGES/DRESSINGS) IMPLANT
DRSG PAD ABDOMINAL 8X10 ST (GAUZE/BANDAGES/DRESSINGS) ×6 IMPLANT
DURAPREP 26ML APPLICATOR (WOUND CARE) ×3 IMPLANT
ELECT REM PT RETURN 9FT ADLT (ELECTROSURGICAL) ×3
ELECTRODE REM PT RTRN 9FT ADLT (ELECTROSURGICAL) ×1 IMPLANT
GAUZE SPONGE 4X4 12PLY STRL (GAUZE/BANDAGES/DRESSINGS) ×3 IMPLANT
GLOVE BIO SURGEON STRL SZ7.5 (GLOVE) IMPLANT
GLOVE BIO SURGEON STRL SZ8 (GLOVE) ×3 IMPLANT
GLOVE BIOGEL PI IND STRL 8 (GLOVE) ×1 IMPLANT
GLOVE BIOGEL PI INDICATOR 8 (GLOVE) ×2
GOWN STRL REUS W/TWL LRG LVL3 (GOWN DISPOSABLE) ×3 IMPLANT
GOWN STRL REUS W/TWL XL LVL3 (GOWN DISPOSABLE) ×3 IMPLANT
KIT BASIN OR (CUSTOM PROCEDURE TRAY) ×3 IMPLANT
MANIFOLD NEPTUNE II (INSTRUMENTS) IMPLANT
NS IRRIG 1000ML POUR BTL (IV SOLUTION) ×3 IMPLANT
PACK ORTHO EXTREMITY (CUSTOM PROCEDURE TRAY) ×3 IMPLANT
PADDING CAST COTTON 6X4 STRL (CAST SUPPLIES) ×3 IMPLANT
POSITIONER SURGICAL ARM (MISCELLANEOUS) IMPLANT
SUT ETHILON 3 0 PS 1 (SUTURE) IMPLANT
SUT ETHILON 4 0 PS 2 18 (SUTURE) ×6 IMPLANT
SUT NYLON 3 0 (SUTURE) IMPLANT
SUT VIC AB 2-0 CT1 27 (SUTURE) ×2
SUT VIC AB 2-0 CT1 TAPERPNT 27 (SUTURE) ×1 IMPLANT
TOWEL OR 17X26 10 PK STRL BLUE (TOWEL DISPOSABLE) ×3 IMPLANT
WATER STERILE IRR 1500ML POUR (IV SOLUTION) IMPLANT

## 2014-10-20 NOTE — Brief Op Note (Signed)
10/19/2014 - 10/20/2014  5:18 PM  PATIENT:  Cory Marks  54 y.o. male  PRE-OPERATIVE DIAGNOSIS:  osteomyelitis right great toe  POST-OPERATIVE DIAGNOSIS:  osteomyelitis right great toe  PROCEDURE:  Procedure(s): AMPUTATION RIGHT GREAT TOE (Right)  SURGEON:  Surgeon(s) and Role:    * Gaynelle Arabian, MD - Primary  PHYSICIAN ASSISTANT:   ASSISTANTS: none   ANESTHESIA:   general  EBL:  Total I/O In: 10 [I.V.:10] Out: 975 [Urine:975]  BLOOD ADMINISTERED:none  DRAINS: none   LOCAL MEDICATIONS USED:  MARCAINE     SPECIMEN:  Source of Specimen:  Right great toe  DISPOSITION OF SPECIMEN:  PATHOLOGY  COUNTS:  YES  TOURNIQUET:   Total Tourniquet Time Documented: Thigh (Right) - 5 minutes Total: Thigh (Right) - 5 minutes   DICTATION: .Other Dictation: Dictation Number [id number]  PLAN OF CARE: Admit to inpatient   PATIENT DISPOSITION:  PACU - hemodynamically stable.

## 2014-10-20 NOTE — Anesthesia Preprocedure Evaluation (Addendum)
Anesthesia Evaluation  Patient identified by MRN, date of birth, ID band Patient awake    Reviewed: Allergy & Precautions, NPO status , Patient's Chart, lab work & pertinent test results  Airway Mallampati: II  TM Distance: >3 FB Neck ROM: Full    Dental no notable dental hx. (+) Missing   Pulmonary neg pulmonary ROS,  breath sounds clear to auscultation  Pulmonary exam normal       Cardiovascular hypertension, + CAD + dysrhythmias Atrial Fibrillation Rhythm:Regular Rate:Normal     Neuro/Psych negative neurological ROS  negative psych ROS   GI/Hepatic negative GI ROS, (+)     substance abuse  ,   Endo/Other  diabetes, Type 2  Renal/GU Renal disease  negative genitourinary   Musculoskeletal negative musculoskeletal ROS (+)   Abdominal   Peds negative pediatric ROS (+)  Hematology negative hematology ROS (+)   Anesthesia Other Findings   Reproductive/Obstetrics negative OB ROS                            Anesthesia Physical Anesthesia Plan  ASA: III  Anesthesia Plan: General   Post-op Pain Management:    Induction: Intravenous  Airway Management Planned: LMA  Additional Equipment:   Intra-op Plan:   Post-operative Plan: Extubation in OR  Informed Consent: I have reviewed the patients History and Physical, chart, labs and discussed the procedure including the risks, benefits and alternatives for the proposed anesthesia with the patient or authorized representative who has indicated his/her understanding and acceptance.   Dental advisory given  Plan Discussed with: CRNA  Anesthesia Plan Comments:         Anesthesia Quick Evaluation

## 2014-10-20 NOTE — Interval H&P Note (Signed)
History and Physical Interval Note:  10/20/2014 4:22 PM  Cory Marks  has presented today for surgery, with the diagnosis of osteomyelitis right great toe  The various methods of treatment have been discussed with the patient and family. After consideration of risks, benefits and other options for treatment, the patient has consented to  Procedure(s): AMPUTATION DIGIT (Right) as a surgical intervention .  The patient's history has been reviewed, patient examined, no change in status, stable for surgery.  I have reviewed the patient's chart and labs.  Questions were answered to the patient's satisfaction.     Gearlean Alf

## 2014-10-20 NOTE — Progress Notes (Addendum)
ANTICOAGULATION CONSULT NOTE - Follow Up Consult  Pharmacy Consult for IV heparin Indication: Afib - bridge therapy (Eliquis PTA)  Allergies  Allergen Reactions  . Oxycodone Itching and Palpitations    Patient Measurements: Height: 6' (182.9 cm) Weight: 222 lb 11.2 oz (101.016 kg) IBW/kg (Calculated) : 77.6 Heparin Dosing Weight: 98.2kg  Vital Signs: Temp: 97.9 F (36.6 C) (03/14 1015) Temp Source: Oral (03/14 1015) BP: 146/94 mmHg (03/14 1015) Pulse Rate: 47 (03/14 1015)  Labs:  Recent Labs  10/17/14 1134 10/17/14 1820 10/17/14 2340 10/18/14 0001 10/18/14 0004 10/18/14 0547 10/19/14 2318 10/20/14 0555  HGB 13.4  --   --   --   --  13.0 12.1* 12.6*  HCT 40.1  --   --   --   --  39.4 36.9* 38.2*  PLT 241  --   --   --   --  245 227 235  LABPROT  --   --   --   --  13.9  --   --   --   INR  --   --   --   --  1.06  --   --   --   HEPARINUNFRC  --   --   --  0.22*  --   --   --  0.80*  CREATININE 1.94*  --   --   --   --  1.78* 2.36*  --   TROPONINI  --  <0.03 <0.03  --   --  <0.03  --   --     Estimated Creatinine Clearance: 44 mL/min (by C-G formula based on Cr of 2.36).   Assessment: 69 YOM presented 3/11 with toe swelling and pain d/t chronic toe ulcer/osteomyelitis. He was found to be in aflutter and complete heart block and started on IV heparin. CHADs2vasc = 2-3, HAS BLED score = 1. Pt discharged on apixaban on 3/12 and returns to [facility] for toe amputation surgery tonight.  Pharmacy consulted to bridge pt with IV heparin.    Patient received 3 doses of apixaban 5mg  BID - LD taken 3/13 AM  Heparin level this AM high however Anti Xa inhibitors can affect heparin levels. Even though pt only received 3 doses, has CKD-III and SCr up so may take longer for apixaban to clear   No issues with CBC  Goal of Therapy:  Heparin level 0.3-0.7 units/ml Monitor platelets by anticoagulation protocol: Yes  APTT: 66-102 seconds   Plan:  Check aPTT  Follow up  recommendations on when to stop heparin prior to surgery Follow up anticoagulation plans post-op.  If heparin resumed, check aPTT and heparin levels until they correlate  Ralene Bathe, PharmD, BCPS 10/20/2014, 11:19 AM  Pager: [phone]

## 2014-10-20 NOTE — Anesthesia Postprocedure Evaluation (Signed)
  Anesthesia Post-op Note  Patient: Cory Marks  Procedure(s) Performed: Procedure(s) (LRB): AMPUTATION RIGHT GREAT TOE (Right)  Patient Location: PACU  Anesthesia Type: General  Level of Consciousness: awake and alert   Airway and Oxygen Therapy: Patient Spontanous Breathing  Post-op Pain: mild  Post-op Assessment: Post-op Vital signs reviewed, Patient's Cardiovascular Status Stable, Respiratory Function Stable, Patent Airway and No signs of Nausea or vomiting  Last Vitals:  Filed Vitals:   10/20/14 1335  BP: 127/97  Pulse: 58  Temp: 36.6 C  Resp: 16    Post-op Vital Signs: stable   Complications: No apparent anesthesia complications

## 2014-10-20 NOTE — Progress Notes (Signed)
ANTICOAGULATION CONSULT NOTE - Initial Consult  Pharmacy Consult for apixaban Indication: atrial fibrillation  Allergies  Allergen Reactions  . Oxycodone Itching and Palpitations    Patient Measurements: Height: 6' (182.9 cm) Weight: 222 lb 11.2 oz (101.016 kg) IBW/kg (Calculated) : 77.6  Vital Signs: Temp: 97.8 F (36.6 C) (03/14 2106) Temp Source: Oral (03/14 2106) BP: 136/94 mmHg (03/14 2106) Pulse Rate: 61 (03/14 2106)  Labs:  Recent Labs  10/17/14 2340 10/18/14 0001 10/18/14 0004  10/18/14 0547 10/19/14 2318 10/20/14 0555 10/20/14 1242  HGB  --   --   --   < > 13.0 12.1* 12.6*  --   HCT  --   --   --   --  39.4 36.9* 38.2*  --   PLT  --   --   --   --  245 227 235  --   APTT  --   --   --   --   --   --   --  107*  LABPROT  --   --  13.9  --   --   --   --   --   INR  --   --  1.06  --   --   --   --   --   HEPARINUNFRC  --  0.22*  --   --   --   --  0.80*  --   CREATININE  --   --   --   --  1.78* 2.36*  --   --   TROPONINI <0.03  --   --   --  <0.03  --   --   --   < > = values in this interval not displayed.  Estimated Creatinine Clearance: 44 mL/min (by C-G formula based on Cr of 2.36).   Medical History: Past Medical History  Diagnosis Date  . Hypertension   . Diabetes mellitus   . Bell's palsy   . Cancer     Mantle Cell Cancer- S/P stem cell transplant  . History of splenectomy   . Polysubstance abuse   . Neuropathy     right leg due to football injury  . CAD (coronary artery disease)   . Sinus bradycardia   . CKD (chronic kidney disease) stage 3, GFR 30-59 ml/min   . Pre-diabetes 05/21/2014    Medications:  Prescriptions prior to admission  Medication Sig Dispense Refill Last Dose  . amLODipine (NORVASC) 10 MG tablet Take 1 tablet (10 mg total) by mouth every morning. 90 tablet 1 10/18/2014 at Unknown time  . apixaban (ELIQUIS) 5 MG TABS tablet Take 1 tablet (5 mg total) by mouth 2 (two) times daily. 60 tablet 0 10/19/2014 at Unknown time   . cephALEXin (KEFLEX) 500 MG capsule Take 500 mg by mouth 4 (four) times daily.   10/19/2014 at Unknown time  . clindamycin (CLEOCIN) 300 MG capsule Take 1 capsule (300 mg total) by mouth 4 (four) times daily. 40 capsule 0 10/19/2014 at Unknown time  . gabapentin (NEURONTIN) 300 MG capsule Take 2 capsules (600 mg total) by mouth 2 (two) times daily. 120 capsule 0 10/18/2014 at Unknown time  . penicillin v potassium (VEETID) 500 MG tablet Take 500 mg by mouth 2 (two) times daily.   10/19/2014 at Unknown time  . pyridOXINE (VITAMIN B-6) 100 MG tablet Take 100 mg by mouth every morning.    10/19/2014 at Unknown time  . rosuvastatin (CRESTOR) 20 MG tablet Take 1  tablet (20 mg total) by mouth daily. 90 tablet 3 Past Week at Unknown time  . saccharomyces boulardii (FLORASTOR) 250 MG capsule Take 1 capsule (250 mg total) by mouth 2 (two) times daily. 60 capsule 2 10/19/2014 at Unknown time  . traMADol (ULTRAM) 50 MG tablet Take 50 mg by mouth every 6 (six) hours as needed for severe pain.   10/19/2014 at Unknown time   Scheduled:  . amLODipine  10 mg Oral q morning - 10a  . [START ON 10/21/2014] apixaban  5 mg Oral BID  . docusate sodium  100 mg Oral BID  . fentaNYL      . fentaNYL      . gabapentin  600 mg Oral BID  . HYDROmorphone      . insulin aspart  0-9 Units Subcutaneous 6 times per day  . loratadine  10 mg Oral Daily  . piperacillin-tazobactam (ZOSYN)  IV  3.375 g Intravenous Q8H  . rosuvastatin  20 mg Oral Daily  . sodium chloride  3 mL Intravenous Q12H  . vancomycin  1,250 mg Intravenous Q24H   Infusions:   PRN: acetaminophen **OR** acetaminophen, HYDROcodone-acetaminophen, HYDROmorphone, methocarbamol **OR** methocarbamol (ROBAXIN)  IV, metoCLOPramide **OR** metoCLOPramide (REGLAN) injection, morphine injection, ondansetron **OR** ondansetron (ZOFRAN) IV, sodium chloride  Assessment: 53 YOM presented 3/11 with toe swelling and pain d/t chronic toe ulcer/osteomyelitis. He was found to be in  aflutter and complete heart block and started on IV heparin. CHADS2Vasc = 2-3, HAS BLED score = 1. Pt discharged on apixaban on 3/12 and returns to [facility] for toe amputation surgery tonight. Pharmacy consulted to bridge pt with IV heparin.    Patient received 3 doses of apixaban 5mg  BID - LD taken 3/13 AM  Heparin stopped at 1354 today, last level affected by apixaban  Last aPTT 107  CBC ok (Hgb low post-op)  SCr > 1.5, weight > 60 kg; Age < 60 yr  No significant drug-drug interactions noted   Goal of Therapy:  Prevent A-fib related thromboembolism   Plan:   Resume apixaban 5 mg PO bid tomorrow (3/15) at 10am (16 hours after AET)  F/u renal function, clinical course  Monitor for signs of bleeding  Reuel Boom, PharmD Pager: [phone] 10/20/2014, 11:23 PM

## 2014-10-20 NOTE — Care Management Note (Addendum)
    Page 1 of 1   10/20/2014     10:49:17 AM CARE MANAGEMENT NOTE 10/20/2014  Patient:  Cory Marks, Cory Marks   Account Number:  [id number]  Date Initiated:  10/20/2014  Documentation initiated by:  Dessa Phi  Subjective/Objective Assessment:   54 y/o m admitted w/osteomyelitis.Readmit-3/11-3/12/16.     Action/Plan:   From home.[place] ordered @  prior d/c.   Anticipated DC Date:  10/24/2014   Anticipated DC Plan:  [place]  CM consult      [facility] Choice  Resumption Of Svcs/PTA Provider   Choice offered to / List presented to:             Status of service:  In process, will continue to follow Medicare Important Message given?   (If response is "NO", the following Medicare IM given date fields will be blank) Date Medicare IM given:   Medicare IM given by:   Date Additional Medicare IM given:   Additional Medicare IM given by:    Discharge Disposition:    Per UR Regulation:  Reviewed for med. necessity/level of care/duration of stay  If discussed at [place] of Stay Meetings, dates discussed:    Comments:  10/20/14 Dessa Phi RN BSN NCM [phone] [place], following for [facility] orders.HHRN-wound care needed can arrange w/HHRN order.

## 2014-10-20 NOTE — Progress Notes (Signed)
[facility] PROGRESS NOTE  Cory Marks YTK:[id number] DOB: [birth year] DOA: 10/19/2014 PCP: Lovina Reach, MD  Brief Summary  The patient is a 54 y.o. 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 He is followed at the wound care center for an ulcer on the right first toe. About a month ago, he had progressive soreness and swelling of the right first toe. He was started on ciprofloxacin and Keflex by the wound care center and had an MRI of his first toe 09/29/2014 which demonstrated osteomyelitis of the proximal and terminal phalanx of the great toe with septic IP joint. He had intended to undergo hyperbaric chamber treatments, however, in the meantime, his toe swelling and pain returned. He was seen at the wound care clinic and told to come to the ER for IV antibiotics and orthopedics consultation for toe amputation.  In the ER, he was incidentally found to be in rate-controlled a-flutter.  Due to a family emergency, he was discharged on oral antibiotics and apixaban and he returned as instructed to resume IV antibiotics on 3/13.  He underwent toe amputation on 3/14 by Dr. Wynelle Link.    Assessment/Plan   Right foot first big toe osteomyelitis and septic arthritis - Appreciate orthopedics, Dr. Wynelle Link, assistance - Started on IV vancomycin and Zosyn -  S/p toe amputation on 3/14 - resume oral clindamycin at discharge  Atrial flutter with complete heart block vs. Variable conduction, hemodynamically stable currently, chest pain free, unclear duration.  - Cardiology consulted during previous admission and cleared patient for surgery - ECHO: EF of 4-45% with mild diffuse hypokinesis -  Telemetry: Atrial flutter, ventricular rate in 40s - CHADs2vasc 3 (HTN, DM, and CHF) - Resume Eliquis   New diagnosis of systolic heart failure, probably chronic -  No BB or AV node blocking medications due to possible heart block -  No ACEI for now due to AKI -  No lasix or spironolactone due to AKI -  F/u with cardiology as outpatient   Diet-controlled diabetes mellitus type 2, hemoglobin A1c of 6 and 08/2014 - Diabetic diet -  Low dose SSI  Mantle cell lymphoma status post splenectomy and autologous stem cell transplant  CKD stage III, baseline creatinine 1.7, creatinine currently 2.36.  - No need to repeat renal ultrasound at this time -  Repeat creatinine in AM - Advised patient not to use NSAIDs and to use only Tylenol as needed for over-the-counter pain relief - Minimize nephrotoxins and renally dose medications  CAD/Hypertension/hyperlipidemia, blood pressure stable - Continued Norvasc and Crestor - No AV nodal blocking medications  Chronic pain - Continue Gabapentin 600mg  BID based on CrCl - Vicodin as needed for breakthrough pain from first toe osteomyelitis  Mild normocytic anemia, may be some mild depression secondary to Zosyn  Diet:  Diabetic diet Access:  PIV IVF:  off Proph:  lovenox  Code Status: full Family Communication: patient alone Disposition Plan: would like to discharge ASAP so that he can return to be with his ailing father   Consultants:  Dr. Wynelle Link, Orthopedics  Procedures:  Right first toe amputation 3/14  Antibiotics:  Vancomycin 3/13   Zosyn 3/13    HPI/Subjective:  Patient denies fevers, chills, states the swelling in his first right toe has  improved some although it is still sore. Denies chest pain, shortness of breath  Objective: Filed Vitals:   10/20/14 1730 10/20/14 1745 10/20/14 1800 10/20/14 1824  BP: 148/106 165/109 153/94 158/98  Pulse: 71 54 58 58  Temp:    98 F (36.7 C)  TempSrc:      Resp: 14 11  11 15   Height:      Weight:      SpO2: 100% 100% 100%     Intake/Output Summary (Last 24 hours) at 10/20/14 1916 Last data filed at 10/20/14 1800  Gross per 24 hour  Intake    810 ml  Output   1075 ml  Net   -265 ml   Filed Weights   10/19/14 2313  Weight: 101.016 kg (222 lb 11.2 oz)    Exam:  General: Obese male, no acute distress Cardiovascular: RRR, normal S1, S2, without m/r/g. 2+ pulses, warm extremities Respiratory: CTA bilaterally without increased WOB Abdomen: NABS, soft, nondistended, nontender MSK: Normal bulk and tone. Old scar with some bony changes about 4-5 cm above the ankle with some swelling of ankle. First toe right foot and forefoot appears mildly swollen, 1 cm diameter ulcer that goes down to the bone on the plantar surface of the first toe, no foul odor or drainage   Data Reviewed: Basic Metabolic Panel:  Recent Labs Lab 10/17/14 1134 10/18/14 0547 10/19/14 2318  NA 140 139 142  K 4.5 3.5 3.5  CL 102 102 106  CO2 29 26 27   GLUCOSE 98 97 97  BUN 26* 21 28*  CREATININE 1.94* 1.78* 2.36*  CALCIUM 9.5 9.0 8.8   Liver Function Tests:  Recent Labs Lab 10/19/14 2318  AST 30  ALT 17  ALKPHOS 46  BILITOT 0.7  PROT 6.1  ALBUMIN 4.0   No results for input(s): LIPASE, AMYLASE in the last 168 hours. No results for input(s): AMMONIA in the last 168 hours. CBC:  Recent Labs Lab 10/17/14 1134 10/18/14 0547 10/19/14 2318 10/20/14 0555  WBC 5.8 4.5 6.3 5.7  NEUTROABS 2.7  --   --   --   HGB 13.4 13.0 12.1* 12.6*  HCT 40.1 39.4 36.9* 38.2*  MCV 96.2 95.2 95.3 96.0  PLT 241 245 227 235   Cardiac Enzymes:  Recent Labs Lab 10/17/14 1820 10/17/14 2340 10/18/14 0547  TROPONINI <0.03 <0.03 <0.03   BNP (last 3 results) No results for input(s): BNP in the last 8760 hours.  ProBNP (last 3 results) No results for input(s): PROBNP in the last 8760 hours.  CBG:  Recent Labs Lab 10/19/14 2324 10/20/14 0404 10/20/14 0743  10/20/14 1156 10/20/14 1601  GLUCAP 152* 99 88 88 92    No results found for this or any previous visit (from the past 240 hour(s)).   Studies: No results found.  Scheduled Meds: . amLODipine  10 mg Oral q morning - 10a  . docusate sodium  100 mg Oral BID  . [START ON 10/21/2014] enoxaparin (LOVENOX) injection  40 mg Subcutaneous Q24H  . fentaNYL      . fentaNYL      . gabapentin  600 mg Oral BID  . HYDROmorphone      . insulin aspart  0-9 Units Subcutaneous 6 times per day  . loratadine  10 mg Oral Daily  . piperacillin-tazobactam (ZOSYN)  IV  3.375 g Intravenous Q8H  . rosuvastatin  20 mg Oral Daily  . sodium chloride  3 mL Intravenous Q12H  . vancomycin  1,250 mg Intravenous Q24H   Continuous Infusions: . dextrose 5 % and 0.45 % NaCl with KCl 10 mEq/L 75 mL/hr at 10/20/14 1353    Active Problems:   HTN (hypertension)   History of bradycardia   Diabetes mellitus type 2, controlled, with complications   CKD (chronic kidney disease) stage 3, GFR 30-59 ml/min   Osteomyelitis of toe of right foot   Atrial flutter   Osteomyelitis    Time spent: 30 min    Cory Marks, [place] [facility] Pager [phone]. If 7PM-7AM, please contact night-coverage at [email or web address], password [email or web address] 10/20/2014, 7:16 PM  LOS: 1 day

## 2014-10-20 NOTE — H&P (View-Only) (Signed)
Reason for Consult:Right great toe ulcer Referring Physician: Dr. Leane Cory Marks is an 54 y.o. male.  HPI: Cory Marks is a 54 yo male with a 2 year history of an ulcer on his right great toe. He has a history of neuropathy and foot drop on right and approximately 2 years ago developed an ulcer on the medial border of his right great toe. He states it was treated in the wound care clinic and eventually healed. Unfortunately a blister, then ulcer developed on the toe again in the past few months. E is again being treated in the wound care center and options of surgery vs. Hyperbaric treatments were given to him. He had an MRI recently showing septic IP joint and osteomyelitis of the proximal and distal phalange of the right great toe. He was going to start hyperbaric treatments but the toe swelled precipitously in the past few days and the wound care clinic saw him today and told him to go to the ED for antibiotics and surgical consultation. He has not had any recent fever, Chills or systemic symptoms. He desires surgical correction for this infection.  Past Medical History  Diagnosis Date  . Hypertension   . Diabetes mellitus   . Bell's palsy   . Cancer     Mantle Cell Cancer- S/P stem cell transplant  . History of splenectomy   . Polysubstance abuse   . Neuropathy     right leg due to football injury  . CAD (coronary artery disease)   . Sinus bradycardia   . CKD (chronic kidney disease) stage 3, GFR 30-59 ml/min     Past Surgical History  Procedure Laterality Date  . Splenectomy, total      mantle cell lymphoma  . Edentulation N/A y-2    prior to transplant    Family History  Problem Relation Age of Onset  . Hypertension Mother   . Diabetes Mother   . Cancer Father     pancreatic cancer  . Diabetes Father   . Heart disease Father   . Diabetes Sister     Social History:  reports that he has never smoked. He does not have any smokeless tobacco history on file. He reports that  he drinks about 0.6 oz of alcohol per week. He reports that he uses illicit drugs (Marijuana).  Allergies:  Allergies  Allergen Reactions  . Oxycodone Itching and Palpitations    Medications: I have reviewed the patient's current medications.  Results for orders placed or performed during the hospital encounter of 10/17/14 (from the past 48 hour(s))  CBC with Differential     Status: Abnormal   Collection Time: 10/17/14 11:34 AM  Result Value Ref Range   WBC 5.8 4.0 - 10.5 K/uL   RBC 4.17 (L) 4.22 - 5.81 MIL/uL   Hemoglobin 13.4 13.0 - 17.0 g/dL   HCT 40.1 39.0 - 52.0 %   MCV 96.2 78.0 - 100.0 fL   MCH 32.1 26.0 - 34.0 pg   MCHC 33.4 30.0 - 36.0 g/dL   RDW 14.3 11.5 - 15.5 %   Platelets 241 150 - 400 K/uL   Neutrophils Relative % 47 43 - 77 %   Neutro Abs 2.7 1.7 - 7.7 K/uL   Lymphocytes Relative 43 12 - 46 %   Lymphs Abs 2.5 0.7 - 4.0 K/uL   Monocytes Relative 6 3 - 12 %   Monocytes Absolute 0.4 0.1 - 1.0 K/uL   Eosinophils Relative 3 0 - 5 %  Eosinophils Absolute 0.2 0.0 - 0.7 K/uL   Basophils Relative 1 0 - 1 %   Basophils Absolute 0.0 0.0 - 0.1 K/uL  Basic metabolic panel     Status: Abnormal   Collection Time: 10/17/14 11:34 AM  Result Value Ref Range   Sodium 140 135 - 145 mmol/L   Potassium 4.5 3.5 - 5.1 mmol/L   Chloride 102 96 - 112 mmol/L   CO2 29 19 - 32 mmol/L   Glucose, Bld 98 70 - 99 mg/dL   BUN 26 (H) 6 - 23 mg/dL   Creatinine, Ser 1.94 (H) 0.50 - 1.35 mg/dL   Calcium 9.5 8.4 - 10.5 mg/dL   GFR calc non Af Amer 37 (L) >90 mL/min   GFR calc Af Amer 43 (L) >90 mL/min    Comment: (NOTE) The eGFR has been calculated using the CKD EPI equation. This calculation has not been validated in all clinical situations. eGFR's persistently <90 mL/min signify possible Chronic Kidney Disease.    Anion gap 9 5 - 15  I-stat troponin, ED     Status: None   Collection Time: 10/17/14  3:56 PM  Result Value Ref Range   Troponin i, poc 0.01 0.00 - 0.08 ng/mL    Comment 3            Comment: Due to the release kinetics of cTnI, a negative result within the first hours of the onset of symptoms does not rule out myocardial infarction with certainty. If myocardial infarction is still suspected, repeat the test at appropriate intervals.     No results found.  ROS Blood pressure 139/99, pulse 45, temperature 98.1 F (36.7 C), temperature source Oral, resp. rate 17, height 6' (1.829 m), SpO2 98 %. Physical Exam  Physical Examination: General appearance - alert, well appearing, and in no distress Mental status - alert, oriented to person, place, and time Neurological - alert, oriented, normal speech, no focal findings or movement disorder noted Right foot- No swelling or warmth; great toe swollen and warm at IP joint with open wound on medial border of great toe. No odor; No purulent drainage Pulses intact; toes pink and warm   Assessment/Plan: Right great toe non-healing ulcer- Has osteomyelitis and septic joint with an ulcer that has been intermittently problematic for 2 years and has worsened recently. I have recommended great toe amputation and discussed the procedure, risks, potential complications and expected healing/rehab course with him and he wants to proceed. I want him to have at least 48 hours of antibiotics first and don't see an urgent need for surgery as he is not systemically ill. Will plan on surgery Monday afternoon. The patient has a dilemma in that a family member is terminal and he feels he needs to be there. From my standpoint I would be fine with oral antibiotics at home and having him come in for surgery Monday if that is acceptable to the medical team  [name] 10/17/2014, 6:39 PM

## 2014-10-20 NOTE — Transfer of Care (Signed)
Immediate Anesthesia Transfer of Care Note  Patient: Cory Marks  Procedure(s) Performed: Procedure(s) (LRB): AMPUTATION RIGHT GREAT TOE (Right)  Patient Location: PACU  Anesthesia Type: General  Level of Consciousness: sedated, patient cooperative and responds to stimulation  Airway & Oxygen Therapy: Patient Spontanous Breathing and Patient connected to face mask oxgen  Post-op Assessment: Report given to PACU RN and Post -op Vital signs reviewed and stable  Post vital signs: Reviewed and stable  Complications: No apparent anesthesia complications

## 2014-10-21 ENCOUNTER — Encounter (HOSPITAL_COMMUNITY): Payer: Self-pay | Admitting: Orthopedic Surgery

## 2014-10-21 DIAGNOSIS — Z8679 Personal history of other diseases of the circulatory system: Secondary | ICD-10-CM

## 2014-10-21 DIAGNOSIS — I5022 Chronic systolic (congestive) heart failure: Secondary | ICD-10-CM

## 2014-10-21 DIAGNOSIS — N183 Chronic kidney disease, stage 3 (moderate): Secondary | ICD-10-CM

## 2014-10-21 LAB — BASIC METABOLIC PANEL
Anion gap: 7 (ref 5–15)
BUN: 19 mg/dL (ref 6–23)
CO2: 28 mmol/L (ref 19–32)
Calcium: 8.7 mg/dL (ref 8.4–10.5)
Chloride: 103 mmol/L (ref 96–112)
Creatinine, Ser: 1.99 mg/dL — ABNORMAL HIGH (ref 0.50–1.35)
GFR calc Af Amer: 42 mL/min — ABNORMAL LOW (ref >90)
GFR, EST NON AFRICAN AMERICAN: 36 mL/min — AB (ref >90)
Glucose, Bld: 102 mg/dL — ABNORMAL HIGH (ref 70–99)
POTASSIUM: 3.5 mmol/L (ref 3.5–5.1)
Sodium: 138 mmol/L (ref 135–145)

## 2014-10-21 LAB — CBC
HCT: 38.4 % — ABNORMAL LOW (ref 39.0–52.0)
Hemoglobin: 12.4 g/dL — ABNORMAL LOW (ref 13.0–17.0)
MCH: 31.2 pg (ref 26.0–34.0)
MCHC: 32.3 g/dL (ref 30.0–36.0)
MCV: 96.7 fL (ref 78.0–100.0)
Platelets: 228 10*3/uL (ref 150–400)
RBC: 3.97 MIL/uL — AB (ref 4.22–5.81)
RDW: 14.5 % (ref 11.5–15.5)
WBC: 5.7 10*3/uL (ref 4.0–10.5)

## 2014-10-21 LAB — GLUCOSE, CAPILLARY
GLUCOSE-CAPILLARY: 89 mg/dL (ref 70–99)
Glucose-Capillary: 111 mg/dL — ABNORMAL HIGH (ref 70–99)
Glucose-Capillary: 66 mg/dL — ABNORMAL LOW (ref 70–99)
Glucose-Capillary: 81 mg/dL (ref 70–99)
Glucose-Capillary: 94 mg/dL (ref 70–99)

## 2014-10-21 LAB — HEMOGLOBIN A1C
HEMOGLOBIN A1C: 6.2 % — AB (ref 4.8–5.6)
MEAN PLASMA GLUCOSE: 131 mg/dL

## 2014-10-21 MED ORDER — DIPHENHYDRAMINE HCL 50 MG PO CAPS
50.0000 mg | ORAL_CAPSULE | Freq: Once | ORAL | Status: AC
  Administered 2014-10-21: 50 mg via ORAL
  Filled 2014-10-21: qty 1

## 2014-10-21 MED ORDER — HEPARIN SOD (PORK) LOCK FLUSH 100 UNIT/ML IV SOLN
500.0000 [IU] | INTRAVENOUS | Status: AC | PRN
  Administered 2014-10-21: 500 [IU]

## 2014-10-21 MED ORDER — HYDROCODONE-ACETAMINOPHEN 5-325 MG PO TABS: 1.0000 | ORAL_TABLET | ORAL | Status: DC | PRN

## 2014-10-21 NOTE — Progress Notes (Signed)
Subjective: 1 Day Post-Op Procedure(s) (LRB): AMPUTATION RIGHT GREAT TOE (Right) Patient reports pain as mild.   Patient seen in rounds with Dr. Wynelle Marks. Patient sitting up in bed eating breakfast. Briefly discussed surgical findings with patient. Patient is well, but has had some minor complaints of pain in the foot, requiring pain medications We will start therapy today. Will allow WBAT to the heel but no weight on the distal end of foot.  Will place into a DARCO wedge shoe to eliminate weight bearing to toes. Plan is to go home as per patient after hospital stay. Patient is on the Medicine Service at this time.  Objective: Vital signs in last 24 hours: Temp:  [97.8 F (36.6 C)-98.4 F (36.9 C)] 98.1 F (36.7 C) (03/15 0443) Pulse Rate:  [47-71] 56 (03/15 0443) Resp:  [11-16] 13 (03/15 0443) BP: (127-165)/(89-109) 135/99 mmHg (03/15 0443) SpO2:  [97 %-100 %] 97 % (03/15 0443)  Intake/Output from previous day: 03/14 0701 - 03/15 0700 In: 610 [I.V.:610] Out: 1875 [Urine:1875] Intake/Output this shift: Total I/O In: 240 [P.O.:240] Out: 200 [Urine:200]   Recent Labs  10/19/14 2318 10/20/14 0555 10/21/14 0405  HGB 12.1* 12.6* 12.4*    Recent Labs  10/20/14 0555 10/21/14 0405  WBC 5.7 5.7  RBC 3.98* 3.97*  HCT 38.2* 38.4*  PLT 235 228    Recent Labs  10/19/14 2318 10/21/14 0405  NA 142 138  K 3.5 3.5  CL 106 103  CO2 27 28  BUN 28* 19  CREATININE 2.36* 1.99*  GLUCOSE 97 102*  CALCIUM 8.8 8.7   No results for input(s): LABPT, INR in the last 72 hours.  EXAM General - Patient is Alert, Appropriate and Oriented Extremity - Neurovascular intact Sensation intact distally Dorsiflexion/Plantar flexion intact Dressing - dressing C/D/I Motor Function - intact, moving foot and toes well on exam.   Past Medical History  Diagnosis Date  . Hypertension   . Diabetes mellitus   . Bell's palsy   . Cancer     Mantle Cell Cancer- S/P stem cell transplant  .  History of splenectomy   . Polysubstance abuse   . Neuropathy     right leg due to football injury  . CAD (coronary artery disease)   . Sinus bradycardia   . CKD (chronic kidney disease) stage 3, GFR 30-59 ml/min   . Pre-diabetes 05/21/2014    Assessment/Plan: 1 Day Post-Op Procedure(s) (LRB): AMPUTATION RIGHT GREAT TOE (Right) Active Problems:   HTN (hypertension)   History of bradycardia   Diabetes mellitus type 2, controlled, with complications   CKD (chronic kidney disease) stage 3, GFR 30-59 ml/min   Osteomyelitis of toe of right foot   Atrial flutter   Osteomyelitis   Chronic systolic congestive heart failure  Estimated body mass index is 30.2 kg/(m^2) as calculated from the following:   Height as of this encounter: 6' (1.829 m).   Weight as of this encounter: 101.016 kg (222 lb 11.2 oz). Up with therapy We will start therapy today. Will allow WBAT to the heel but no weight on the distal end of foot.  Will place into a DARCO wedge shoe to eliminate weight bearing to toes. DVT Prophylaxis - Patient is taking Eliquis at home. Weight-Bearing as tolerated to left heel only, no weight on the toes and distal foot. D/C O2 and Pulse OX and try on Room Air  Arlee Muslim, PA-C Orthopaedic Surgery 10/21/2014, 9:08 AM

## 2014-10-21 NOTE — Op Note (Signed)
NAME:  Cory Marks, Cory Marks NO.:  [id number]  MEDICAL RECORD NO.:  [id number]  LOCATION:  38                         FACILITY:  [facility]  PHYSICIAN:  Gaynelle Arabian, M.D.    DATE OF BIRTH:  Nov 17, 1960  DATE OF PROCEDURE:  10/20/2014 DATE OF DISCHARGE:                              OPERATIVE REPORT   PREOPERATIVE DIAGNOSIS:  Nonhealing ulcer with osteomyelitis, right great toe.  POSTOPERATIVE DIAGNOSIS:  Nonhealing ulcer with osteomyelitis, right great toe.  PROCEDURE:  Right great toe amputation.  SURGEON:  Gaynelle Arabian, M.D.  No assistant.  ANESTHESIA:  General.  ESTIMATED BLOOD LOSS:  Minimal.  DRAINS:  None.  TOURNIQUET TIME:  5 minutes at 300 mmHg.  COMPLICATIONS:  None.  CONDITION:  Stable to recovery.  BRIEF CLINICAL NOTE:  [name] is a 54 year old male who has had an approximately 2-year history of intermittent right great toe ulcer.  It has gotten much worse in the past 2 months with increasing swelling and pain recently.  He had an MRI, which showed osteomyelitis of the proximal phalanx as well as a septic IP joint.  He has had long-term management of this nonoperatively and it has failed.  He presents now for great toe amputation.  PROCEDURE IN DETAIL:  After successful administration of general anesthetic, a tourniquet was placed high on his right thigh and his right lower extremity was prepped and draped in usual sterile fashion. The extremity was elevated and the tourniquet then elevated to 300 mmHg. Tennis racket shaped incision was made with the proximal limb being along the medial border of the first metatarsal about 2 cm proximal to the MTP joint, and then the racket aspect of the incision around the base of the great toe.  The skin was cut with 10 blade through the subcutaneous tissue down to the MTP joint.  The toe was excised.  I also inspected the IP joint and there was significant destruction of the proximal phalanx at the IP  joint consistent with long-term osteomyelitis.  Once the toe was resected, I then thoroughly irrigated the wound bed.  The sesamoid bones were left intact.  I closed the anterior to the posterior periosteum with interrupted 2-0 Vicryl.  I then released the tourniquet for a time of 5 minutes.  Minor bleeding was stopped with electrocautery and meticulous hemostasis was achieved. Further irrigation was performed.  The subcu was then closed with interrupted 2-0 Vicryl and skin closed with interrupted 4-0 nylon.  The incision was cleaned and dried and a bulky sterile dressing was applied.  He was then awakened and transported to recovery in stable condition.     Gaynelle Arabian, M.D.     FA/MEDQ  D:  10/20/2014  T:  10/21/2014  Job:  [id number]

## 2014-10-21 NOTE — Consult Note (Signed)
CARDIOLOGY CONSULT NOTE   Patient ID: Cory Marks MRN: [id number], DOB/AGE: 55/55/1962   Admit date: 10/19/2014 Date of Consult: 10/21/2014   Primary Physician: Lovina Reach, MD Primary Cardiologist: Dr. Marlou Porch  Pt. Profile  The patient is a pleasant 54 year old African American male with past medical history of HTN, DM, history of polysubstance abuse, CAD, CKD stage III, history of bradycardia and Mantle cell lymphoma status post stem cell transplant and splenectomy presented with R toe osteomyelitis and incidentally found to be in a-flutter  Problem List  Past Medical History  Diagnosis Date  . Hypertension   . Diabetes mellitus   . Bell's palsy   . Cancer     Mantle Cell Cancer- S/P stem cell transplant  . History of splenectomy   . Polysubstance abuse   . Neuropathy     right leg due to football injury  . CAD (coronary artery disease)   . Sinus bradycardia   . CKD (chronic kidney disease) stage 3, GFR 30-59 ml/min   . Pre-diabetes 05/21/2014    Past Surgical History  Procedure Laterality Date  . Splenectomy, total      mantle cell lymphoma  . Edentulation N/A y-2    prior to transplant  . Amputation Right 10/20/2014    Procedure: AMPUTATION RIGHT GREAT TOE;  Surgeon: Gaynelle Arabian, MD;  Location: [facility];  Service: Orthopedics;  Laterality: Right;     Allergies  Allergies  Allergen Reactions  . Oxycodone Itching and Palpitations    HPI   The patient is a pleasant 54 year old [name] with past medical history of HTN, DM, history of polysubstance abuse, CAD, CKD stage III, history of bradycardia and Mantle cell lymphoma status post stem cell transplant and splenectomy. Patient has been having issues with nonhealing ulcers in his right foot for the past 2 years. A month ago, he had progressive soreness and swelling in his right first toe. He has been followed up by wound care center who has started him on dual antibiotic therapy. He had a  MRI his first toe on 09/29/2014 which demonstrated osteomyelitis of proximal and terminal phalanx of great toe with septic IP joint. He was initially intended to undergo hyperbaric chamber treatment, however the swelling and pain returned. He was instructed to come to [facility] ED for IV antibiotic and surgical consult. He was seen by orthopedic and plan for right toe amputation. Incidentally, on arrival, he was noted to have onset atrial flutter with variable conduction rate. Cardiology was consulted at the time. He was seen by Dr. Marlou Porch, during the last admission, he was cleared for surgery from cardiology perspective. He had a echocardiogram on 10/18/2014 which showed EF 40-45%, mild diffuse hypokinesis, mild AR/MR, mildly dilated left atrium. However prior to surgery, he had to leave the hospital as his father was very ill and was about to pass away. He eventually returned to the hospital on the following day on 10/19/2014.  He eventually proceeded with right great toe surgery on 10/20/2014 without significant complication. Of note, patient's atrial flutter has been self rate controlled without any AV nodal agent. His heart rate has been persistent in the 60-80 range with occasional spikes. Cardiology has been reconsulted on this patient.  Inpatient Medications  . amLODipine  10 mg Oral q morning - 10a  . apixaban  5 mg Oral BID  . docusate sodium  100 mg Oral BID  . gabapentin  600 mg Oral BID  . insulin aspart  0-9  Units Subcutaneous 6 times per day  . loratadine  10 mg Oral Daily  . piperacillin-tazobactam (ZOSYN)  IV  3.375 g Intravenous Q8H  . rosuvastatin  20 mg Oral Daily  . sodium chloride  3 mL Intravenous Q12H  . vancomycin  1,250 mg Intravenous Q24H    Family History Family History  Problem Relation Age of Onset  . Hypertension Mother   . Diabetes Mother   . Cancer Father     pancreatic cancer  . Diabetes Father   . Heart disease Father   . Diabetes Sister      Social  History History   Social History  . Marital Status: Married    Spouse Name: N/A  . Number of Children: N/A  . Years of Education: N/A   Occupational History  . Not on file.   Social History Main Topics  . Smoking status: Never Smoker   . Smokeless tobacco: Not on file  . Alcohol Use: 0.6 oz/week    1 Cans of beer per week     Comment: does not drink every day.  no history of withdrawal   . Drug Use: Yes    Special: Marijuana     Comment: Cocaine, prescription drug abuse, etc  . Sexual Activity:    Partners: Female   Other Topics Concern  . Not on file   Social History Narrative     Review of Systems  General:  No chills, fever, night sweats or weight changes.  Cardiovascular:  No chest pain, dyspnea on exertion, edema, orthopnea, palpitations, paroxysmal nocturnal dyspnea. Dermatological: No rash,masses Respiratory: No cough, dyspnea Urologic: No hematuria, dysuria Abdominal:   No nausea, vomiting, diarrhea, bright red blood per rectum, melena, or hematemesis Neurologic:  No visual changes, wkns, changes in mental status. R foot pain.  All other systems reviewed and are otherwise negative except as noted above.  Physical Exam  Blood pressure 135/92, pulse 62, temperature 98 F (36.7 C), temperature source Oral, resp. rate 16, height 6' (1.829 m), weight 222 lb 11.2 oz (101.016 kg), SpO2 97 %.  General: Pleasant, NAD Psych: Normal affect. Neuro: Alert and oriented X 3. Moves all extremities spontaneously. HEENT: Normal  Neck: Supple without bruits or JVD. Lungs:  Resp regular and unlabored. Decreased breath sound in bilateral bases, no obvious rale, rhonchi or wheezin.  Heart: Irregular. no s3, s4, or murmurs. Abdomen: Soft, non-tender, non-distended, BS + x 4.  Extremities: No clubbing, cyanosis or edema. R foot in dressing.   Labs  No results for input(s): CKTOTAL, CKMB, TROPONINI in the last 72 hours. Lab Results  Component Value Date   WBC 5.7 10/21/2014    HGB 12.4* 10/21/2014   HCT 38.4* 10/21/2014   MCV 96.7 10/21/2014   PLT 228 10/21/2014    Recent Labs Lab 10/19/14 2318 10/21/14 0405  NA 142 138  K 3.5 3.5  CL 106 103  CO2 27 28  BUN 28* 19  CREATININE 2.36* 1.99*  CALCIUM 8.8 8.7  PROT 6.1  --   BILITOT 0.7  --   ALKPHOS 46  --   ALT 17  --   AST 30  --   GLUCOSE 97 102*   Lab Results  Component Value Date   CHOL 264* 09/05/2014   HDL 60 09/05/2014   LDLCALC 178* 09/05/2014   TRIG 130 09/05/2014   Lab Results  Component Value Date   DDIMER * 11/30/2010    13.37  AT THE INHOUSE ESTABLISHED CUTOFF VALUE OF 0.48 ug/mL FEU, THIS ASSAY HAS BEEN DOCUMENTED IN THE LITERATURE TO HAVE A SENSITIVITY AND NEGATIVE PREDICTIVE VALUE OF AT LEAST 98 TO 99%.  THE TEST RESULT SHOULD BE CORRELATED WITH AN ASSESSMENT OF THE CLINICAL PROBABILITY OF DVT / VTE. REPEATED TO VERIFY    Radiology/Studies  Mr Foot Right W Wo Contrast  09/30/2014   CLINICAL DATA:  Great toe osteomyelitis. Neuropathy in the RIGHT foot. Infection of the great toe.  EXAM: MRI OF THE RIGHT FOREFOOT WITHOUT AND WITH CONTRAST  TECHNIQUE: Multiplanar, multisequence MR imaging was performed both before and after administration of intravenous contrast.  CONTRAST:  36mL MULTIHANCE GADOBENATE DIMEGLUMINE 529 MG/ML IV SOLN  COMPARISON:  Radiographs 09/05/2014.  FINDINGS: There is osteomyelitis of the phalanges of the great toe. This affects both the proximal and distal phalanges, with destructive changes of the great toe IP joint consistent with septic arthritis. Cavitation of the distal proximal phalanx of the great toe is present. The first MTP joint appears intact with a small reactive effusion. There is no soft tissue abscess however there is likely bony sequestrum in the distal terminal phalanx associated with septic arthritis and osteomyelitis. Cellulitis of the forefoot is present. Myopathy of the foot is present with fatty atrophy, edema, and mild  enhancement most commonly seen in diabetics. Ulceration is present in the superficial medial great toe.  IMPRESSION: Medial great toe ulceration with septic IP joint and osteomyelitis of both proximal and terminal phalanx of the great toe.   Electronically Signed   By: Dereck Ligas M.D.   On: 09/30/2014 11:48     Echocardiogram 10/18/2014 LV EF: 40% -  45%  ------------------------------------------------------------------- Indications:   Atrial flutter 427.32.  ------------------------------------------------------------------- History:  PMH: Bradycardia. CKD 3. Lymphoma. Hypercarbia. Coronary artery disease. Risk factors: Hypertension. Diabetes mellitus. Dyslipidemia.  ------------------------------------------------------------------- Study Conclusions  - Left ventricle: The cavity size was normal. There was mild concentric hypertrophy. Systolic function was mildly to moderately reduced. The estimated ejection fraction was in the range of 40% to 45%. Mild diffuse hypokinesis. - Aortic valve: There was mild regurgitation. - Mitral valve: There was mild regurgitation. - Left atrium: The atrium was mildly dilated. - Right ventricle: The cavity size was dilated. Wall thickness was normal. - Right atrium: The atrium was mildly dilated.    ECG  Atrial flutter with various conduction rate  ASSESSMENT AND PLAN  1. Atrial flutter with variable conduction, newly diagnosed, unknown duration  - CHADS-Vasc score 2-3 (HTN, DM, +/- PVD)  - Started on Eliquis after right toe amputation, currently doing well. No need for AV nodal agent as patient's heart rate has been self regulated  - Will arrange for outpatient follow-up with Dr. Marlou Porch in 3 weeks, before consider outpatient DC cardioversion.  - Patient is stable from cardiology perspective, can be discharged when medically stable from surgical and internal medicine perspective.  2. Mild LV dysfunction without obvious  sign of acute HF  - Echo 10/17/2013 EF 40-45%, diffuse hypokinesis, mild AR/MR, mildly dilated LA  3. HTN 4. DM 5. history of polysubstance abuse 6. CAD 7. CKD s/p splenectomy  - Cr stable 8. history of bradycardia  9. Mantle cell lymphoma status post stem cell transplant   Hilbert Corrigan, PA-C 10/21/2014, 3:55 PM    Patient seen and examined. Agree with assessment and plan. Mr. Santa is a 54 year old African American male with a history of HTN, DM, history of polysubstance abuse, CAD, CKD stage III,  history of bradycardia and Mantle cell lymphoma status post stem cell transplant and splenectomy who presented with R toe osteomyelitis and incidentally found to be in a-flutter. He is s/p R great toe amputation for a nonhealing ulcer with osteomyelitis. He tolerated surgery well without hemodynamic compromise. No chest pain or dyspnea. ECG read independently by me reveals A Flutter with variable block with ventricular rate 50. QTC is minimally prolonged at 468 msec. Telemetry now shows HR in the 60's. Well compensated without CHF on exam. Now on eliquis for NOAC therapy without bleeding. Would continue for 4 weeks prior to attempt at DC cardioversion. Not on any negative chronotropic drug with controlled rate, suspect underlying sick sinus syndrome. Would continue with aggressive lipid therapy with target LDL < 70; pt is now on crestor 20 mg. Schedule f/u with Dr. Yehuda Savannah, MD, [facility] 10/21/2014 4:35 PM

## 2014-10-21 NOTE — Evaluation (Signed)
Physical Therapy Evaluation Patient Details Name: Cory Marks MRN: [id number] DOB: 1961/07/09 Today's Date: 10/21/2014   History of Present Illness  s/p amputation R great toe, h/o polysubstance abuse, DM, HTN, lymphoma, R foot drop (from high school football injury), peripheral neuropathy B feet  Clinical Impression  Pt admitted with above diagnosis. Pt currently with functional limitations due to the deficits listed below (see PT Problem List). Pt ambulated 300' with RW and R darco shoe. He has chronic R foot drop. 10/10 pain with walking. Pt will benefit from skilled PT to increase their independence and safety with mobility to allow discharge to the venue listed below.       Follow Up Recommendations No PT follow up    Equipment Recommendations  None recommended by PT    Recommendations for Other Services       Precautions / Restrictions Precautions Precautions: Fall Required Braces or Orthoses: Other Brace/Splint Other Brace/Splint: Darco shoe R, pt has AFO at home Restrictions Weight Bearing Restrictions: Yes Other Position/Activity Restrictions: WBAT R heel, no WB on toes      Mobility  Bed Mobility Overal bed mobility: Independent                Transfers Overall transfer level: Needs assistance Equipment used: Rolling walker (2 wheeled) Transfers: Sit to/from Stand Sit to Stand: Supervision         General transfer comment: cues for hand placement  Ambulation/Gait Ambulation/Gait assistance: Min guard Ambulation Distance (Feet): 300 Feet Assistive device: Rolling walker (2 wheeled) Gait Pattern/deviations: Step-to pattern;Decreased dorsiflexion - right;Antalgic;Decreased weight shift to right     General Gait Details: R foot drop, pt walked with Darco shoe, pain 10/10 with walking  Stairs            Wheelchair Mobility    Modified Rankin (Stroke Patients Only)       Balance Overall balance assessment: Modified Independent                                            Pertinent Vitals/Pain Pain Assessment: 0-10 Pain Score: 10-Worst pain ever Pain Location: R foot Pain Descriptors / Indicators: Sore Pain Intervention(s): Limited activity within patient's tolerance;Monitored during session;Repositioned;RN gave pain meds during session    Home Living Family/patient expects to be discharged to:: Private residence Living Arrangements: Parent Available Help at Discharge: Family Type of Home: House Home Access: Stairs to enter   Technical brewer of Steps: 2 Home Layout: Two level;Able to live on main level with bedroom/bathroom Home Equipment: Gilford Rile - 2 wheels;Bedside commode;Shower seat;Other (comment) (AFO)      Prior Function Level of Independence: Independent               Hand Dominance        Extremity/Trunk Assessment   Upper Extremity Assessment: Overall WFL for tasks assessed           Lower Extremity Assessment: RLE deficits/detail;LLE deficits/detail RLE Deficits / Details: ankle DF 0/5 (chronic foot drop), knee extension 4/5, hip 5/5 LLE Deficits / Details: bottom of foot numb from chemo per pt  Cervical / Trunk Assessment: Normal  Communication   Communication: No difficulties  Cognition Arousal/Alertness: Awake/alert Behavior During Therapy: WFL for tasks assessed/performed Overall Cognitive Status: Within Functional Limits for tasks assessed  General Comments      Exercises        Assessment/Plan    PT Assessment Patient needs continued PT services  PT Diagnosis Acute pain;Difficulty walking   PT Problem List Decreased strength;Decreased activity tolerance;Pain  PT Treatment Interventions DME instruction;Gait training;Stair training;Functional mobility training;Therapeutic activities;Patient/family education;Therapeutic exercise   PT Goals (Current goals can be found in the Care Plan section) Acute Rehab PT  Goals Patient Stated Goal: decrease pain PT Goal Formulation: With patient Time For Goal Achievement: 10/28/14 Potential to Achieve Goals: Good    Frequency Min 5X/week   Barriers to discharge        Co-evaluation               End of Session Equipment Utilized During Treatment: Gait belt;Other (comment) (Darco shoe) Activity Tolerance: Patient tolerated treatment well Patient left: in chair;with call bell/phone within reach Nurse Communication: Mobility status         Time: 7741-2878 PT Time Calculation (min) (ACUTE ONLY): 37 min   Charges:   PT Evaluation $Initial PT Evaluation Tier I: 1 Procedure PT Treatments $Gait Training: 8-22 mins   PT G Codes:        Philomena Doheny 10/21/2014, 10:25 AM [phone]

## 2014-10-21 NOTE — Discharge Summary (Signed)
Physician Discharge Summary  Cory Marks EVO:[id number] DOB: Mar 25, 1961 DOA: 10/19/2014  PCP: Lovina Reach, MD  Admit date: 10/19/2014 Discharge date: 10/21/2014  Recommendations for Outpatient Follow-up:  1. F/u with Dr. Marlou Porch, cardiology, in three weeks to discuss possible cardioversion 2. F/u with Dr. Wynelle Link in 10 days.  Please follow up pathology report.    Discharge Diagnoses:  Active Problems:   HTN (hypertension)   History of bradycardia   Diabetes mellitus type 2, controlled, with complications   CKD (chronic kidney disease) stage 3, GFR 30-59 ml/min   Osteomyelitis of toe of right foot   Atrial flutter   Osteomyelitis   Chronic systolic congestive heart failure   Discharge Condition: stable   Diet recommendation: Diabetic diet  Wt Readings from Last 3 Encounters:  10/19/14 101.016 kg (222 lb 11.2 oz)  10/17/14 99.655 kg (219 lb 11.2 oz)  10/16/14 98.431 kg (217 lb)    History of present illness:   The patient is a 54 y.o. 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 He is followed at the wound care center for an ulcer on the right first toe. About a month ago, he had progressive soreness and swelling of the right first toe. He was started on ciprofloxacin and Keflex by the wound care center and had an MRI of his first toe 09/29/2014 which demonstrated osteomyelitis of the proximal and terminal phalanx of the great toe with septic IP joint. He had intended to undergo hyperbaric chamber treatments, however, in the meantime, his toe swelling and pain returned. He was seen at the wound care clinic and told to come to the ER for IV antibiotics and orthopedics consultation for  toe amputation. In the ER, he was incidentally found to be in rate-controlled a-flutter. Due to a family emergency, he was discharged on oral antibiotics and apixaban and he returned as instructed to resume IV antibiotics on 3/13. He underwent toe amputation on 3/14 by Dr. Wynelle Link. Post-operatively, he had intermittent tachycardia to the 120s-130s.  He was seen again by cardiology who deemed him stable for discharge.    Hospital Course:   Right foot first big toe osteomyelitis and septic arthritis with associated cellulitis - Appreciate orthopedics, Dr. Wynelle Link, assistance - Started on IV vancomycin and Zosyn initially - S/p toe amputation on 3/14 -  Surgical pathology is pending - resume oral clindamycin at discharge for related cellulitis   Atrial flutter with complete heart block vs. Variable conduction, hemodynamically stable currently, chest pain free, unclear duration. Ventricular rate mostly in 40s-60s, however, intermittently up to the 120-130 range with variable conduction.   - Cardiology consulted and cleared patient for discharge - ECHO: EF of 4-45% with mild diffuse hypokinesis - Telemetry: Atrial flutter, ventricular rate in 40s - CHADs2vasc 3 (HTN, DM, and CHF) - Resume Eliquis  - No AV nodal blocking medications   Mild decreased LV function 40-45% - No BB or AV node blocking medications due to possible heart block - No ACEI for now due to AKI - No lasix or spironolactone due to recent AKI - F/u with cardiology as outpatient   CAD/Hypertension/hyperlipidemia, blood pressure stable to mildly elevated - Continued Norvasc and Crestor - No AV nodal blocking medications  -  On eliquis  Diet-controlled  diabetes mellitus type 2, hemoglobin A1c of 6 and 08/2014 - Diabetic diet  Mantle cell lymphoma status post splenectomy and autologous stem cell transplant  CKD stage III, baseline creatinine 1.7, creatinine currently 2.36. creatinine trended down  with IVF. - Advised patient not to use NSAIDs and to use only Tylenol as needed for over-the-counter pain relief  Chronic pain - Continue Gabapentin 600mg  BID based on CrCl - Vicodin as needed for breakthrough pain  Mild normocytic anemia, may be some mild depression secondary to Zosyn  Consultants:  Dr. Wynelle Link, Orthopedics  Dr. Claiborne Billings, Cardiology  Procedures:  Right first toe amputation 3/14  Antibiotics:  Vancomycin 3/13   Zosyn 3/13  Discharge Exam: Filed Vitals:   10/21/14 1321  BP: 135/92  Pulse: 62  Temp: 98 F (36.7 C)  Resp: 16   Filed Vitals:   10/21/14 0001 10/21/14 0443 10/21/14 1018 10/21/14 1321  BP: 127/89 135/99  135/92  Pulse: 58 56 125 62  Temp: 98.4 F (36.9 C) 98.1 F (36.7 C)  98 F (36.7 C)  TempSrc: Oral Oral  Oral  Resp: 16 13  16   Height:      Weight:      SpO2: 98% 97%  97%   General: Obese male,no acute distress Cardiovascular: RRR, normal S1, S2, without m/r/g. 2+ pulses, warm extremities Respiratory: CTA bilaterally without increased WOB Abdomen: NABS, soft, nondistended, nontender MSK: Normal bulk and tone. Old scar with some bony changes about 4-5 cm above the ankle with some swelling of ankle. Toe is bandaged.  Discharge Instructions      Discharge Instructions    (HEART FAILURE PATIENTS) Call MD:  Anytime you have any of the following symptoms: 1) 3 pound weight gain in 24 hours or 5 pounds in 1 week 2) shortness of breath, with or without a dry hacking cough 3) swelling in the hands, feet or stomach 4) if you have to sleep on extra pillows at night in order to breathe.    Complete by:  As directed      Call MD for:  difficulty breathing, headache or visual disturbances    Complete by:  As directed      Call MD for:  extreme fatigue    Complete by:  As directed      Call MD for:  hives    Complete by:  As directed      Call MD for:  persistant dizziness or light-headedness    Complete by:  As directed       Call MD for:  persistant nausea and vomiting    Complete by:  As directed      Call MD for:  redness, tenderness, or signs of infection (pain, swelling, redness, odor or green/yellow discharge around incision site)    Complete by:  As directed      Call MD for:  severe uncontrolled pain    Complete by:  As directed      Call MD for:  temperature >100.4    Complete by:  As directed      Diet Carb Modified    Complete by:  As directed      Discharge instructions    Complete by:  As directed   You were hospitalized with an infection in your toe.  Please keep your dressing clean and dry.  Call Dr. Anne Fu office tomorrow to schedule a follow up appointment.  Please resume your clindamycin.  Continue your penicillin but please STOP your keflex.  The clindamycin replaces the keflex that you had been taking before.  Please continue to take your apixaban and follow up with cardiology in about 3 weeks.  Call to schedule an appointment tomorrow.  If you have fevers, chills, worsening pain, chest pain, shortness of breath, fainting spells, or racing heartbeat that does not go away or that is associated with shortness of breath, please return immediately to the hospital.     Increase activity slowly    Complete by:  As directed      Leave dressing on - Keep it clean, dry, and intact until clinic visit    Complete by:  As directed             Medication List    STOP taking these medications        cephALEXin 500 MG capsule  Commonly known as:  KEFLEX     traMADol 50 MG tablet  Commonly known as:  ULTRAM      TAKE these medications        amLODipine 10 MG tablet  Commonly known as:  NORVASC  Take 1 tablet (10 mg total) by mouth every morning.     apixaban 5 MG Tabs tablet  Commonly known as:  ELIQUIS  Take 1 tablet (5 mg total) by mouth 2 (two) times daily.     clindamycin 300 MG capsule  Commonly known as:  CLEOCIN  Take 1 capsule (300 mg total) by mouth 4 (four) times daily.      gabapentin 300 MG capsule  Commonly known as:  NEURONTIN  Take 2 capsules (600 mg total) by mouth 2 (two) times daily.     HYDROcodone-acetaminophen 5-325 MG per tablet  Commonly known as:  NORCO/VICODIN  Take 1-2 tablets by mouth every 4 (four) hours as needed for moderate pain.     penicillin v potassium 500 MG tablet  Commonly known as:  VEETID  Take 500 mg by mouth 2 (two) times daily.     pyridOXINE 100 MG tablet  Commonly known as:  VITAMIN B-6  Take 100 mg by mouth every morning.     rosuvastatin 20 MG tablet  Commonly known as:  CRESTOR  Take 1 tablet (20 mg total) by mouth daily.     saccharomyces boulardii 250 MG capsule  Commonly known as:  FLORASTOR  Take 1 capsule (250 mg total) by mouth 2 (two) times daily.       Follow-up Information    Follow up with Gearlean Alf, MD In 1 week.   Specialty:  Orthopedic Surgery   Contact information:   [address] [place] [address] [phone]        The results of significant diagnostics from this hospitalization (including imaging, microbiology, ancillary and laboratory) are listed below for reference.    Significant Diagnostic Studies: Mr Foot Right W Wo Contrast  09/30/2014   CLINICAL DATA:  Great toe osteomyelitis. Neuropathy in the RIGHT foot. Infection of the great toe.  EXAM: MRI OF THE RIGHT FOREFOOT WITHOUT AND WITH CONTRAST  TECHNIQUE: Multiplanar, multisequence MR imaging was performed both before and after administration of intravenous contrast.  CONTRAST:  78mL MULTIHANCE GADOBENATE DIMEGLUMINE 529 MG/ML IV SOLN  COMPARISON:  Radiographs 09/05/2014.  FINDINGS: There is osteomyelitis of the phalanges of the great toe. This affects both the proximal and distal phalanges, with destructive changes of the great toe IP joint consistent with septic arthritis. Cavitation of the distal proximal phalanx of the great toe is  present. The first MTP joint appears intact with a small reactive  effusion. There is no soft tissue abscess however there is likely bony sequestrum in the distal terminal phalanx associated with septic arthritis and osteomyelitis. Cellulitis of the forefoot is present. Myopathy of the foot is present with fatty atrophy, edema, and mild enhancement most commonly seen in diabetics. Ulceration is present in the superficial medial great toe.  IMPRESSION: Medial great toe ulceration with septic IP joint and osteomyelitis of both proximal and terminal phalanx of the great toe.   Electronically Signed   By: Dereck Ligas M.D.   On: 09/30/2014 11:48    Microbiology: No results found for this or any previous visit (from the past 240 hour(s)).   Labs: Basic Metabolic Panel:  Recent Labs Lab 10/17/14 1134 10/18/14 0547 10/19/14 2318 10/21/14 0405  NA 140 139 142 138  K 4.5 3.5 3.5 3.5  CL 102 102 106 103  CO2 29 26 27 28   GLUCOSE 98 97 97 102*  BUN 26* 21 28* 19  CREATININE 1.94* 1.78* 2.36* 1.99*  CALCIUM 9.5 9.0 8.8 8.7   Liver Function Tests:  Recent Labs Lab 10/19/14 2318  AST 30  ALT 17  ALKPHOS 46  BILITOT 0.7  PROT 6.1  ALBUMIN 4.0   No results for input(s): LIPASE, AMYLASE in the last 168 hours. No results for input(s): AMMONIA in the last 168 hours. CBC:  Recent Labs Lab 10/17/14 1134 10/18/14 0547 10/19/14 2318 10/20/14 0555 10/21/14 0405  WBC 5.8 4.5 6.3 5.7 5.7  NEUTROABS 2.7  --   --   --   --   HGB 13.4 13.0 12.1* 12.6* 12.4*  HCT 40.1 39.4 36.9* 38.2* 38.4*  MCV 96.2 95.2 95.3 96.0 96.7  PLT 241 245 227 235 228   Cardiac Enzymes:  Recent Labs Lab 10/17/14 1820 10/17/14 2340 10/18/14 0547  TROPONINI <0.03 <0.03 <0.03   BNP: BNP (last 3 results) No results for input(s): BNP in the last 8760 hours.  ProBNP (last 3 results) No results for input(s): PROBNP in the last 8760 hours.  CBG:  Recent Labs Lab 10/20/14 2345 10/21/14 0410 10/21/14 0736 10/21/14 1218 10/21/14 1614  GLUCAP 81 94 66* 89 111*     Time coordinating discharge: 35 minutes  Signed:  Taym Twist  [facility] 10/21/2014, 7:13 PM

## 2014-12-11 ENCOUNTER — Encounter: Payer: Self-pay | Admitting: Internal Medicine

## 2014-12-11 ENCOUNTER — Ambulatory Visit (INDEPENDENT_AMBULATORY_CARE_PROVIDER_SITE_OTHER): Payer: Medicare HMO | Admitting: Internal Medicine

## 2014-12-11 VITALS — BP 128/94 | HR 79 | Temp 97.8°F | Resp 16 | Ht 72.0 in | Wt 226.0 lb

## 2014-12-11 DIAGNOSIS — I10 Essential (primary) hypertension: Secondary | ICD-10-CM | POA: Diagnosis not present

## 2014-12-11 DIAGNOSIS — R7309 Other abnormal glucose: Secondary | ICD-10-CM

## 2014-12-11 DIAGNOSIS — R7303 Prediabetes: Secondary | ICD-10-CM

## 2014-12-11 DIAGNOSIS — Z23 Encounter for immunization: Secondary | ICD-10-CM | POA: Diagnosis not present

## 2014-12-11 DIAGNOSIS — N182 Chronic kidney disease, stage 2 (mild): Secondary | ICD-10-CM | POA: Diagnosis not present

## 2014-12-11 DIAGNOSIS — I739 Peripheral vascular disease, unspecified: Secondary | ICD-10-CM

## 2014-12-11 DIAGNOSIS — Q8901 Asplenia (congenital): Secondary | ICD-10-CM | POA: Diagnosis not present

## 2014-12-11 DIAGNOSIS — Z87898 Personal history of other specified conditions: Secondary | ICD-10-CM

## 2014-12-11 DIAGNOSIS — Z9189 Other specified personal risk factors, not elsewhere classified: Secondary | ICD-10-CM

## 2014-12-11 LAB — BASIC METABOLIC PANEL WITH GFR
BUN: 27 mg/dL — AB (ref 6–23)
CALCIUM: 9.4 mg/dL (ref 8.4–10.5)
CO2: 24 mEq/L (ref 19–32)
Chloride: 101 mEq/L (ref 96–112)
Creat: 2.15 mg/dL — ABNORMAL HIGH (ref 0.50–1.35)
GFR, EST AFRICAN AMERICAN: 39 mL/min — AB
GFR, Est Non African American: 34 mL/min — ABNORMAL LOW
Glucose, Bld: 83 mg/dL (ref 70–99)
Potassium: 3.8 mEq/L (ref 3.5–5.3)
SODIUM: 141 meq/L (ref 135–145)

## 2014-12-11 MED ORDER — METFORMIN HCL ER 500 MG PO TB24: 500.0000 mg | ORAL_TABLET | Freq: Every day | ORAL | Status: DC

## 2014-12-11 NOTE — Patient Instructions (Signed)
How to Avoid Diabetes Problems You can do a lot to prevent or slow down diabetes problems. Following your diabetes plan and taking care of yourself can reduce your risk of serious or life-threatening complications. Below, you will find certain things you can do to prevent diabetes problems. MANAGE YOUR DIABETES Follow your health care provider's, nurse educator's, and dietitian's instructions for managing your diabetes. They will teach you the basics of diabetes care. They can help answer questions you may have. Learn about diabetes and make healthy choices regarding eating and physical activity. Monitor your blood glucose level regularly. Your health care provider will help you decide how often to check your blood glucose level depending on your treatment goals and how well you are meeting them.  DO NOT USE NICOTINE Nicotine and diabetes are a dangerous combination. Nicotine raises your risk for diabetes problems. If you quit using nicotine, you will lower your risk for heart attack, stroke, nerve disease, and kidney disease. Your cholesterol and your blood pressure levels may improve. Your blood circulation will also improve. Do not use any tobacco products, including cigarettes, chewing tobacco, or electronic cigarettes. If you need help quitting, ask your health care provider. KEEP YOUR BLOOD PRESSURE UNDER CONTROL Keeping your blood pressure under control will help prevent damage to your eyes, kidneys, heart, and blood vessels. Blood pressure consists of two numbers. The top number should be below 120, and the bottom number should be below 80 (120/80). Keep your blood pressure as close to these numbers as you can. If you already have kidney disease, you may want even lower blood pressure to protect your kidneys. Talk to your health care provider to make sure that your blood pressure goal is right for your needs. Meal planning, medicines, and exercise can help you reach your blood pressure target. Have  your blood pressure checked at every visit with your health care provider. KEEP YOUR CHOLESTEROL UNDER CONTROL Normal cholesterol levels will help prevent heart disease and stroke. These are the biggest health problems for people with diabetes. Keeping cholesterol levels under control can also help with blood flow. Have your cholesterol level checked at least once a year. Your health care provider may prescribe a medicine known as a statin. Statins lower your cholesterol. If you are not taking a statin, ask your health care provider if you should be. Meal planning, exercise, and medicines can help you reach your cholesterol targets.  SCHEDULE AND KEEP YOUR ANNUAL PHYSICAL EXAMS AND EYE EXAMS Your health care provider will tell you how often he or she wants to see you depending on your plan of treatment. It is important that you keep these appointments so that possible problems can be identified early and complications can be avoided or treated.  Every visit with your health care provider should include your weight, blood pressure, and an evaluation of your blood glucose control.  Your hemoglobin A1c should be checked:  At least twice a year if you are at your goal.  Every 3 months if there are changes in treatment.  If you are not meeting your goals.  Your blood lipids should be checked yearly. You should also be checked yearly to see if you have protein in your urine (microalbumin).  Schedule a dilated eye exam within 5 years of your diagnosis if you have type 1 diabetes, and then yearly. Schedule a dilated eye exam at diagnosis if you have type 2 diabetes, and then yearly. All exams thereafter can be extended to every 2  to 3 years if one or more exams have been normal. KEEP YOUR VACCINES CURRENT The flu vaccine is recommended yearly. The formula for the vaccine changes every year and needs to be updated for the best protection against current viruses. It is recommended that people with diabetes  who are over 55 years old get the pneumonia vaccine. In some cases, two separate shots may be given. Ask your health care provider if your pneumonia vaccination is up-to-date. However, there are some instances where another vaccine is recommended. Check with your health care provider. TAKE CARE OF YOUR FEET  Diabetes may cause you to have a poor blood supply (circulation) to your legs and feet. Because of this, the skin may be thinner, break easier, and heal more slowly. You also may have nerve damage in your legs and feet, causing decreased feeling. You may not notice minor injuries to your feet that could lead to serious problems or infections. Taking care of your feet is very important. Visual foot exams are performed at every routine medical visit. The exams check for cuts, injuries, or other problems with the feet. A comprehensive foot exam should be done yearly. This includes visual inspection as well as assessing foot pulses and testing for loss of sensation. You should also do the following:  Inspect your feet daily for cuts, calluses, blisters, ingrown toenails, and signs of infection, such as redness, swelling, or pus.  Wash and dry your feet thoroughly, especially between the toes.  Avoid soaking your feet regularly in hot water baths.  Moisturize dry skin with lotion, avoiding areas between your toes.  Cut toenails straight across and file the edges.  Avoid shoes that do not fit well or have areas that irritate your skin.  Avoid going barefooted or wearing only socks. Your feet need protection. TAKE CARE OF YOUR TEETH People with poorly controlled diabetes are more likely to have gum (periodontal) disease. These infections make diabetes harder to control. Periodontal diseases, if left untreated, can lead to tooth loss. Brush your teeth twice a day, floss, and see your dentist for checkups and cleaning every 6 months, or 2 times a year. ASK YOUR HEALTH CARE PROVIDER ABOUT TAKING  ASPIRIN Taking aspirin daily is recommended to help prevent cardiovascular disease in people with and without diabetes. Ask your health care provider if this would benefit you and what dose he or she would recommend. DRINK RESPONSIBLY Moderate amounts of alcohol (less than 1 drink per day for adult women and less than 2 drinks per day for adult men) have a minimal effect on blood glucose if ingested with food. It is important to eat food with alcohol to avoid hypoglycemia. People should avoid alcohol if they have a history of alcohol abuse or dependence, if they are pregnant, and if they have liver disease, pancreatitis, advanced neuropathy, or severe hypertriglyceridemia. LESSEN STRESS Living with diabetes can be stressful. When you are under stress, your blood glucose may be affected in two ways:  Stress hormones may cause your blood glucose to rise.  You may be distracted from taking good care of yourself. It is a good idea to be aware of your stress level and make changes that are necessary to help you better manage challenging situations. Support groups, planned relaxation, a hobby you enjoy, meditation, healthy relationships, and exercise all work to lower your stress level. If your efforts do not seem to be helping, get help from your health care provider or a trained mental health professional. Document  Released: 04/12/2011 Document Revised: 12/09/2013 Document Reviewed: 09/18/2013 [facility] Patient Information 2015 [place], [place]. This information is not intended to replace advice given to you by your health care provider. Make sure you discuss any questions you have with your health care provider.

## 2014-12-11 NOTE — Progress Notes (Signed)
Patient ID: Cory Marks, male   DOB: 03/27/1961, 55 y.o.   MRN: [id number]  HPI 54 y.o. male  presents for 3 month follow up with hypertension, hyperlipidemia, prediabetes and vitamin D. His blood pressure has not been controlled at home, today their BP is BP: (!) 128/94 mmHg He does not workout. He denies chest pain, shortness of breath, dizziness.  He is on cholesterol medication and denies myalgias. His cholesterol is not at goal. The cholesterol last visit was:   Lab Results  Component Value Date   CHOL 264* 09/05/2014   HDL 60 09/05/2014   LDLCALC 178* 09/05/2014   TRIG 130 09/05/2014   CHOLHDL 4.4 09/05/2014   He has been working on diet and exercise for prediabetes, and denies foot ulcerations, hyperglycemia and paresthesia of the feet. Last A1C in the office was:  Lab Results  Component Value Date   HGBA1C 6.2* 10/19/2014   Patient is not on Vitamin D supplement.   No results found for: VD25OH     Current Medications:  Current Outpatient Prescriptions on File Prior to Visit  Medication Sig Dispense Refill  . amLODipine (NORVASC) 10 MG tablet Take 1 tablet (10 mg total) by mouth every morning. 90 tablet 1  . apixaban (ELIQUIS) 5 MG TABS tablet Take 1 tablet (5 mg total) by mouth 2 (two) times daily. 60 tablet 0  . gabapentin (NEURONTIN) 300 MG capsule Take 2 capsules (600 mg total) by mouth 2 (two) times daily. 120 capsule 0  . penicillin v potassium (VEETID) 500 MG tablet Take 500 mg by mouth 2 (two) times daily.    Marland Kitchen pyridOXINE (VITAMIN B-6) 100 MG tablet Take 100 mg by mouth every morning.     . rosuvastatin (CRESTOR) 20 MG tablet Take 1 tablet (20 mg total) by mouth daily. 90 tablet 3  . HYDROcodone-acetaminophen (NORCO/VICODIN) 5-325 MG per tablet Take 1-2 tablets by mouth every 4 (four) hours as needed for moderate pain. (Patient not taking: Reported on 12/11/2014) 30 tablet 0   No current facility-administered medications on file prior to visit.   Medical History:   Past Medical History  Diagnosis Date  . Hypertension   . Diabetes mellitus   . Bell's palsy   . Cancer     Mantle Cell Cancer- S/P stem cell transplant  . History of splenectomy   . Polysubstance abuse   . Neuropathy     right leg due to football injury  . CAD (coronary artery disease)   . Sinus bradycardia   . CKD (chronic kidney disease) stage 3, GFR 30-59 ml/min   . Pre-diabetes 05/21/2014   Allergies:  Allergies  Allergen Reactions  . Oxycodone Itching and Palpitations     Review of Systems:  Review of Systems  Constitutional: Negative.   HENT: Negative.   Eyes: Negative.   Respiratory: Negative.   Cardiovascular: Negative.   Gastrointestinal: Negative.   Genitourinary: Negative.   Musculoskeletal: Negative.   Skin: Negative.   Neurological: Positive for sensory change (Decreased sensation on right foot for many years since high school football injury).  Endo/Heme/Allergies: Negative.   Psychiatric/Behavioral: Negative.      Family history- Reviewed. Father deceased since last visit Social history- Review and unchanged Physical Exam: BP 128/94 mmHg  Pulse 79  Temp(Src) 97.8 F (36.6 C) (Oral)  Resp 16  Ht 6' (1.829 m)  Wt 226 lb (102.513 kg)  BMI 30.64 kg/m2 Wt Readings from Last 3 Encounters:  12/11/14 226 lb (102.513 kg)  10/19/14  222 lb 11.2 oz (101.016 kg)  10/17/14 219 lb 11.2 oz (99.655 kg)   General Appearance: Well nourished, in no apparent distress. Eyes: PERRLA, EOMs, conjunctiva no swelling or erythema Sinuses: No Frontal/maxillary tenderness ENT/Mouth: Ext aud canals clear, TMs without erythema, bulging. No erythema, swelling, or exudate on post pharynx.  Tonsils not swollen or erythematous. Hearing normal.  Neck: Supple, thyroid normal.  Respiratory: Respiratory effort normal, BS equal bilaterally without rales, rhonchi, wheezing or stridor.  Cardio: RRR with no MRGs. Brisk peripheral pulses without edema.  Abdomen: Soft, + BS.  Non  tender, no guarding, rebound, hernias, masses. Lymphatics: Non tender without lymphadenopathy.  Musculoskeletal: Full ROM, 5/5 strength, normal gait.  Skin: normal except for decreased sensation by microfilament test on right foot. Neuro: Cranial nerves intact. Normal muscle tone, no cerebellar symptoms. Sensation intact.  Psych: Awake and oriented X 3, normal affect, Insight and Judgment appropriate.   Assessment and Plan:   1. Essential hypertension - BP still not at goal for diastolic BP. He is on maximum dose of Norvasc and in the setting of CKD,  I want to check his renal function before further adjusting medications.  Will likely add ACE-I or ARB versus Diuretic as he also has mild decreased LVF. - Microalbumin, urine - BASIC METABOLIC PANEL WITH GFR - Ambulatory referral to Ophthalmology - Continue medication, monitor blood pressure at home. Continue DASH diet.  Reminder to go to the ER if any CP, SOB, nausea, dizziness, severe HA, changes vision/speech, left arm numbness and tingling, and jaw pain.  2. Pre-diabetes - Pt has been having foot ulcers that has led to amputation of right great toe. He now also has an ulcer on the 2nd toe which is being managed by [facility]. I will start on Metformin 500 mg for Pre-diabetes. - Microalbumin, urine - BASIC METABOLIC PANEL WITH GFR - metFORMIN (GLUCOPHAGE XR) 500 MG 24 hr tablet; Take 1 tablet (500 mg total) by mouth daily with breakfast.  Dispense: 30 tablet; Refill: 11 - Ambulatory referral to Ophthalmology Pre-diabetes-Continue diet and exercise. Check A1C  3. CKD (chronic kidney disease), stage II - BASIC METABOLIC PANEL WITH GFR  4. PVD (peripheral vascular disease) - Ambulatory referral to Ophthalmology  5. Asplenia - Pt had spleen removed in the context of his cancer. He is now asplenic and requires immunization for encapsulated organisms.  - Pneumococcal conjugate vaccine 13-valent  6. Immunization  due - Pneumococcal conjugate vaccine 13-valent  7. At risk for osteoporosis - Check vitamin D level  Cholesterol: Continue diet and exercise. Check cholesterol (Pt to return for fasting labs).  Continue diet and meds as discussed. Further disposition pending results of labs.  Cory Harr A., MD 9:42 AM [facility]

## 2014-12-12 LAB — MICROALBUMIN, URINE: Microalb, Ur: 4.3 mg/dL — ABNORMAL HIGH (ref <2.0)

## 2014-12-22 ENCOUNTER — Encounter (HOSPITAL_BASED_OUTPATIENT_CLINIC_OR_DEPARTMENT_OTHER): Payer: Self-pay | Admitting: *Deleted

## 2014-12-22 NOTE — Progress Notes (Signed)
Discussed pt with Dr. Orene Desanctis. Labs in chart from 12/11/14  No need to repeat pre-op lab work. EKG from 3/16 in chart. No need to repeat EKG unless pt has elevated heart rate DOS.

## 2014-12-25 ENCOUNTER — Encounter (HOSPITAL_BASED_OUTPATIENT_CLINIC_OR_DEPARTMENT_OTHER)
Admission: RE | Disposition: A | Discharge status: Home / Self Care | Payer: Self-pay | Source: Ambulatory Visit | Attending: Orthopedic Surgery

## 2014-12-25 ENCOUNTER — Ambulatory Visit (HOSPITAL_BASED_OUTPATIENT_CLINIC_OR_DEPARTMENT_OTHER): Payer: Medicare HMO | Admitting: Anesthesiology

## 2014-12-25 ENCOUNTER — Ambulatory Visit (HOSPITAL_BASED_OUTPATIENT_CLINIC_OR_DEPARTMENT_OTHER)
Admission: RE | Admit: 2014-12-25 | Discharge: 2014-12-25 | Disposition: A | Discharge status: Home / Self Care | Payer: Medicare HMO | Source: Ambulatory Visit | Attending: Orthopedic Surgery | Admitting: Orthopedic Surgery

## 2014-12-25 ENCOUNTER — Encounter (HOSPITAL_BASED_OUTPATIENT_CLINIC_OR_DEPARTMENT_OTHER): Payer: Self-pay

## 2014-12-25 DIAGNOSIS — I4891 Unspecified atrial fibrillation: Secondary | ICD-10-CM | POA: Diagnosis not present

## 2014-12-25 DIAGNOSIS — E785 Hyperlipidemia, unspecified: Secondary | ICD-10-CM | POA: Diagnosis not present

## 2014-12-25 DIAGNOSIS — Z79899 Other long term (current) drug therapy: Secondary | ICD-10-CM | POA: Insufficient documentation

## 2014-12-25 DIAGNOSIS — N183 Chronic kidney disease, stage 3 (moderate): Secondary | ICD-10-CM | POA: Insufficient documentation

## 2014-12-25 DIAGNOSIS — M205X1 Other deformities of toe(s) (acquired), right foot: Secondary | ICD-10-CM | POA: Insufficient documentation

## 2014-12-25 DIAGNOSIS — I129 Hypertensive chronic kidney disease with stage 1 through stage 4 chronic kidney disease, or unspecified chronic kidney disease: Secondary | ICD-10-CM | POA: Diagnosis not present

## 2014-12-25 DIAGNOSIS — M869 Osteomyelitis, unspecified: Secondary | ICD-10-CM | POA: Diagnosis present

## 2014-12-25 DIAGNOSIS — G629 Polyneuropathy, unspecified: Secondary | ICD-10-CM | POA: Diagnosis not present

## 2014-12-25 DIAGNOSIS — L97519 Non-pressure chronic ulcer of other part of right foot with unspecified severity: Secondary | ICD-10-CM | POA: Diagnosis not present

## 2014-12-25 DIAGNOSIS — E119 Type 2 diabetes mellitus without complications: Secondary | ICD-10-CM | POA: Insufficient documentation

## 2014-12-25 DIAGNOSIS — Z880 Allergy status to penicillin: Secondary | ICD-10-CM | POA: Insufficient documentation

## 2014-12-25 DIAGNOSIS — I2510 Atherosclerotic heart disease of native coronary artery without angina pectoris: Secondary | ICD-10-CM | POA: Insufficient documentation

## 2014-12-25 DIAGNOSIS — Z885 Allergy status to narcotic agent status: Secondary | ICD-10-CM | POA: Insufficient documentation

## 2014-12-25 HISTORY — DX: Hyperlipidemia, unspecified: E78.5

## 2014-12-25 HISTORY — DX: Cardiac arrhythmia, unspecified: I49.9

## 2014-12-25 HISTORY — PX: TENOLYSIS: SHX396

## 2014-12-25 HISTORY — PX: AMPUTATION: SHX166

## 2014-12-25 HISTORY — DX: Myoneural disorder, unspecified: G70.9

## 2014-12-25 LAB — POCT HEMOGLOBIN-HEMACUE: Hemoglobin: 13.6 g/dL (ref 13.0–17.0)

## 2014-12-25 LAB — GLUCOSE, CAPILLARY: GLUCOSE-CAPILLARY: 72 mg/dL (ref 65–99)

## 2014-12-25 SURGERY — AMPUTATION, FOOT, RAY
Anesthesia: Monitor Anesthesia Care | Site: Toe | Laterality: Right

## 2014-12-25 MED ORDER — LACTATED RINGERS IV SOLN
INTRAVENOUS | Status: DC
  Administered 2014-12-25: 14:00:00 via INTRAVENOUS

## 2014-12-25 MED ORDER — SODIUM CHLORIDE 0.9 % IV SOLN: INTRAVENOUS | Status: DC

## 2014-12-25 MED ORDER — FENTANYL CITRATE (PF) 100 MCG/2ML IJ SOLN
INTRAMUSCULAR | Status: DC | PRN
  Administered 2014-12-25: 100 ug via INTRAVENOUS

## 2014-12-25 MED ORDER — ONDANSETRON HCL 4 MG/2ML IJ SOLN
INTRAMUSCULAR | Status: DC | PRN
  Administered 2014-12-25: 4 mg via INTRAVENOUS

## 2014-12-25 MED ORDER — MIDAZOLAM HCL 2 MG/2ML IJ SOLN
INTRAMUSCULAR | Status: AC
  Filled 2014-12-25: qty 2

## 2014-12-25 MED ORDER — FENTANYL CITRATE (PF) 100 MCG/2ML IJ SOLN
25.0000 ug | INTRAMUSCULAR | Status: DC | PRN
Start: 2014-12-25 — End: 2014-12-25

## 2014-12-25 MED ORDER — CEFAZOLIN SODIUM-DEXTROSE 2-3 GM-% IV SOLR
INTRAVENOUS | Status: AC
  Filled 2014-12-25: qty 50

## 2014-12-25 MED ORDER — PROPOFOL 10 MG/ML IV BOLUS
INTRAVENOUS | Status: DC | PRN
  Administered 2014-12-25 (×4): 20 mg via INTRAVENOUS

## 2014-12-25 MED ORDER — FENTANYL CITRATE (PF) 100 MCG/2ML IJ SOLN
INTRAMUSCULAR | Status: AC
  Filled 2014-12-25: qty 2

## 2014-12-25 MED ORDER — LIDOCAINE HCL 2 % IJ SOLN
INTRAMUSCULAR | Status: AC
  Filled 2014-12-25: qty 20

## 2014-12-25 MED ORDER — MIDAZOLAM HCL 2 MG/2ML IJ SOLN
1.0000 mg | INTRAMUSCULAR | Status: DC | PRN
  Administered 2014-12-25: 2 mg via INTRAVENOUS
  Administered 2014-12-25: 1 mg via INTRAVENOUS

## 2014-12-25 MED ORDER — FENTANYL CITRATE (PF) 100 MCG/2ML IJ SOLN
INTRAMUSCULAR | Status: AC
  Filled 2014-12-25: qty 4

## 2014-12-25 MED ORDER — CEFAZOLIN SODIUM-DEXTROSE 2-3 GM-% IV SOLR
2.0000 g | INTRAVENOUS | Status: AC
  Administered 2014-12-25: 2 g via INTRAVENOUS

## 2014-12-25 MED ORDER — ROPIVACAINE HCL 5 MG/ML IJ SOLN
INTRAMUSCULAR | Status: DC | PRN
  Administered 2014-12-25: 25 mL

## 2014-12-25 MED ORDER — SODIUM CHLORIDE 0.9 % IV SOLN
INTRAVENOUS | Status: DC
Start: 2014-12-25 — End: 2014-12-25

## 2014-12-25 MED ORDER — FENTANYL CITRATE (PF) 100 MCG/2ML IJ SOLN
50.0000 ug | INTRAMUSCULAR | Status: DC | PRN
  Administered 2014-12-25: 100 ug via INTRAVENOUS

## 2014-12-25 MED ORDER — BACITRACIN ZINC 500 UNIT/GM EX OINT
TOPICAL_OINTMENT | CUTANEOUS | Status: AC
  Filled 2014-12-25: qty 28.35

## 2014-12-25 MED ORDER — CHLORHEXIDINE GLUCONATE 4 % EX LIQD: 60.0000 mL | Freq: Once | CUTANEOUS | Status: DC

## 2014-12-25 MED ORDER — BUPIVACAINE-EPINEPHRINE (PF) 0.5% -1:200000 IJ SOLN
INTRAMUSCULAR | Status: AC
  Filled 2014-12-25: qty 30

## 2014-12-25 SURGICAL SUPPLY — 64 items
BLADE AVERAGE 25MMX9MM (BLADE)
BLADE AVERAGE 25X9 (BLADE) IMPLANT
BLADE OSC/SAG .038X5.5 CUT EDG (BLADE) IMPLANT
BLADE SURG 10 STRL SS (BLADE) IMPLANT
BLADE SURG 15 STRL LF DISP TIS (BLADE) ×1 IMPLANT
BLADE SURG 15 STRL SS (BLADE) ×2
BNDG COHESIVE 4X5 TAN STRL (GAUZE/BANDAGES/DRESSINGS) ×3 IMPLANT
BNDG CONFORM 3 STRL LF (GAUZE/BANDAGES/DRESSINGS) ×3 IMPLANT
BNDG ESMARK 4X9 LF (GAUZE/BANDAGES/DRESSINGS) ×3 IMPLANT
CHLORAPREP W/TINT 26ML (MISCELLANEOUS) ×3 IMPLANT
CORDS BIPOLAR (ELECTRODE) IMPLANT
COVER BACK TABLE 60X90IN (DRAPES) ×3 IMPLANT
DECANTER SPIKE VIAL GLASS SM (MISCELLANEOUS) IMPLANT
DRAPE EXTREMITY T 121X128X90 (DRAPE) ×3 IMPLANT
DRAPE OEC MINIVIEW 54X84 (DRAPES) IMPLANT
DRAPE SURG 17X23 STRL (DRAPES) ×3 IMPLANT
DRAPE U-SHAPE 47X51 STRL (DRAPES) ×3 IMPLANT
DRSG EMULSION OIL 3X3 NADH (GAUZE/BANDAGES/DRESSINGS) ×3 IMPLANT
DRSG MEPITEL 4X7.2 (GAUZE/BANDAGES/DRESSINGS) ×3 IMPLANT
DRSG PAD ABDOMINAL 8X10 ST (GAUZE/BANDAGES/DRESSINGS) IMPLANT
ELECT REM PT RETURN 9FT ADLT (ELECTROSURGICAL) ×3
ELECTRODE REM PT RTRN 9FT ADLT (ELECTROSURGICAL) ×1 IMPLANT
GAUZE SPONGE 4X4 12PLY STRL (GAUZE/BANDAGES/DRESSINGS) ×3 IMPLANT
GAUZE SPONGE 4X4 16PLY XRAY LF (GAUZE/BANDAGES/DRESSINGS) IMPLANT
GLOVE BIO SURGEON STRL SZ 6.5 (GLOVE) ×2 IMPLANT
GLOVE BIO SURGEON STRL SZ8 (GLOVE) ×3 IMPLANT
GLOVE BIO SURGEONS STRL SZ 6.5 (GLOVE) ×1
GLOVE BIOGEL PI IND STRL 7.0 (GLOVE) ×1 IMPLANT
GLOVE BIOGEL PI IND STRL 8 (GLOVE) ×1 IMPLANT
GLOVE BIOGEL PI INDICATOR 7.0 (GLOVE) ×2
GLOVE BIOGEL PI INDICATOR 8 (GLOVE) ×2
GLOVE EXAM NITRILE MD LF STRL (GLOVE) IMPLANT
GOWN STRL REUS W/ TWL LRG LVL3 (GOWN DISPOSABLE) ×1 IMPLANT
GOWN STRL REUS W/ TWL XL LVL3 (GOWN DISPOSABLE) ×1 IMPLANT
GOWN STRL REUS W/TWL LRG LVL3 (GOWN DISPOSABLE) ×2
GOWN STRL REUS W/TWL XL LVL3 (GOWN DISPOSABLE) ×2
NDL SAFETY ECLIPSE 18X1.5 (NEEDLE) IMPLANT
NEEDLE HYPO 18GX1.5 SHARP (NEEDLE)
NEEDLE HYPO 25X1 1.5 SAFETY (NEEDLE) IMPLANT
NS IRRIG 1000ML POUR BTL (IV SOLUTION) ×3 IMPLANT
PACK BASIN DAY SURGERY FS (CUSTOM PROCEDURE TRAY) ×3 IMPLANT
PAD CAST 4YDX4 CTTN HI CHSV (CAST SUPPLIES) ×1 IMPLANT
PADDING CAST ABS 4INX4YD NS (CAST SUPPLIES)
PADDING CAST ABS COTTON 4X4 ST (CAST SUPPLIES) IMPLANT
PADDING CAST COTTON 4X4 STRL (CAST SUPPLIES) ×2
PENCIL BUTTON HOLSTER BLD 10FT (ELECTRODE) ×3 IMPLANT
SANITIZER HAND PURELL 535ML FO (MISCELLANEOUS) ×3 IMPLANT
SHEET MEDIUM DRAPE 40X70 STRL (DRAPES) ×3 IMPLANT
SLEEVE SCD COMPRESS KNEE MED (MISCELLANEOUS) ×3 IMPLANT
SPONGE LAP 18X18 X RAY DECT (DISPOSABLE) ×3 IMPLANT
STOCKINETTE 6  STRL (DRAPES) ×2
STOCKINETTE 6 STRL (DRAPES) ×1 IMPLANT
SUCTION FRAZIER TIP 10 FR DISP (SUCTIONS) IMPLANT
SUT ETHILON 2 0 FS 18 (SUTURE) IMPLANT
SUT ETHILON 2 0 FSLX (SUTURE) ×3 IMPLANT
SUT ETHILON 3 0 PS 1 (SUTURE) IMPLANT
SUT MNCRL AB 3-0 PS2 18 (SUTURE) IMPLANT
SWAB COLLECTION DEVICE MRSA (MISCELLANEOUS) IMPLANT
SYR BULB 3OZ (MISCELLANEOUS) ×3 IMPLANT
SYR CONTROL 10ML LL (SYRINGE) IMPLANT
TOWEL OR 17X24 6PK STRL BLUE (TOWEL DISPOSABLE) ×6 IMPLANT
TUBE CONNECTING 20'X1/4 (TUBING)
TUBE CONNECTING 20X1/4 (TUBING) IMPLANT
UNDERPAD 30X30 (UNDERPADS AND DIAPERS) ×3 IMPLANT

## 2014-12-25 NOTE — Anesthesia Procedure Notes (Addendum)
Anesthesia Regional Block:  Ankle block  Pre-Anesthetic Checklist: ,, timeout performed, Correct Patient, Correct Site, Correct Laterality, Correct Procedure, Correct Position, site marked, Risks and benefits discussed, pre-op evaluation, post-op pain management  Laterality: Right  Prep: Maximum Sterile Barrier Precautions used and chloraprep       Needles:  Injection technique: Single-shot  Needle Type: Other     Needle Length: 3cm  Needle Gauge: 25 and 25 G    Additional Needles: Ankle block Narrative:  Start time: 12/25/2014 2:15 PM End time: 12/25/2014 2:20 PM Injection made incrementally with aspirations every 5 mL. Anesthesiologist: Roderic Palau  Additional Notes: Superficial and deep Peroneal, Posterior Tibial. Negative aspiration of blood prior to all injections.   Procedure Name: MAC Date/Time: 12/25/2014 3:20 PM Performed by: Melynda Ripple D Pre-anesthesia Checklist: Patient identified, Timeout performed, Emergency Drugs available, Suction available and Patient being monitored Placement Confirmation: CO2 detector

## 2014-12-25 NOTE — Discharge Instructions (Signed)
Cory Simmer, MD [name]  Please read the following information regarding your care after surgery.  Medications  You only need a prescription for the narcotic pain medicine (ex. oxycodone, Percocet, Norco).  All of the other medicines listed below are available over the counter. X ibuprofen 800 mg every 8 hours as you need for mild to moderate pain X hydorocodone / acetaminophen as prescribed for severe pain  X Resume Eliquus tomorrow  Weight Bearing X Bear weight when you are able on your operated leg or foot in the post-op shoe.  Cast / Splint / Dressing X Keep your dressing clean and dry.  Dont put anything (coat hanger, pencil, etc) down inside of it.  If it gets damp, use a hair dryer on the cool setting to dry it.  If it gets soaked, call the office to schedule an appointment for a dressing change.  After your dressing, cast or splint is removed; you may shower, but do not soak or scrub the wound.  Allow the water to run over it, and then gently pat it dry.  Swelling It is normal for you to have swelling where you had surgery.  To reduce swelling and pain, keep your toes above your nose for at least 3 days after surgery.  It may be necessary to keep your foot or leg elevated for several weeks.  If it hurts, it should be elevated.  Follow Up Call my office at [phone] when you are discharged from the hospital or surgery center to schedule an appointment to be seen two weeks after surgery.  Call my office at [phone] if you develop a fever >101.5 F, nausea, vomiting, bleeding from the surgical site or severe pain.     Post Anesthesia Home Care Instructions  Activity: Get plenty of rest for the remainder of the day. A responsible adult should stay with you for 24 hours following the procedure.  For the next 24 hours, DO NOT: -Drive a car -Paediatric nurse -Drink alcoholic beverages -Take any medication unless instructed by your physician -Make any  legal decisions or sign important papers.  Meals: Start with liquid foods such as gelatin or soup. Progress to regular foods as tolerated. Avoid greasy, spicy, heavy foods. If nausea and/or vomiting occur, drink only clear liquids until the nausea and/or vomiting subsides. Call your physician if vomiting continues.  Special Instructions/Symptoms: Your throat may feel dry or sore from the anesthesia or the breathing tube placed in your throat during surgery. If this causes discomfort, gargle with warm salt water. The discomfort should disappear within 24 hours.  If you had a scopolamine patch placed behind your ear for the management of post- operative nausea and/or vomiting:  1. The medication in the patch is effective for 72 hours, after which it should be removed.  Wrap patch in a tissue and discard in the trash. Wash hands thoroughly with soap and water. 2. You may remove the patch earlier than 72 hours if you experience unpleasant side effects which may include dry mouth, dizziness or visual disturbances. 3. Avoid touching the patch. Wash your hands with soap and water after contact with the patch.   Call your surgeon if you experience:   1.  Fever over 101.0. 2.  Inability to urinate. 3.  Nausea and/or vomiting. 4.  Extreme swelling or bruising at the surgical site. 5.  Continued bleeding from the incision. 6.  Increased pain, redness or drainage from the incision. 7.  Problems related to your pain  medication. 8. Any change in color, movement and/or sensation 9. Any problems and/or concerns

## 2014-12-25 NOTE — Transfer of Care (Signed)
Immediate Anesthesia Transfer of Care Note  Patient: Cory Marks  Procedure(s) Performed: Procedure(s): RIGHT 2ND TOE AMPUTATION  (Right) RIGHT 3,4,5 FLEXOR TENDON RELEASE  (Right)  Patient Location: PACU  Anesthesia Type:MAC and Regional  Level of Consciousness: sedated  Airway & Oxygen Therapy: Patient Spontanous Breathing and Patient connected to face mask oxygen  Post-op Assessment: Report given to RN and Post -op Vital signs reviewed and stable  Post vital signs: Reviewed and stable  Last Vitals:  Filed Vitals:   12/25/14 1420  BP:   Pulse: 68  Temp:   Resp: 14    Complications: No apparent anesthesia complications

## 2014-12-25 NOTE — Progress Notes (Signed)
Assisted Dr. Oren Bracket with right, ankle block. Side rails up, monitors on throughout procedure. See vital signs in flow sheet. Tolerated Procedure well.

## 2014-12-25 NOTE — Anesthesia Preprocedure Evaluation (Addendum)
Anesthesia Evaluation  Patient identified by MRN, date of birth, ID band Patient awake    Reviewed: Allergy & Precautions, H&P , NPO status , Patient's Chart, lab work & pertinent test results  Airway Mallampati: II  TM Distance: >3 FB Neck ROM: Full    Dental no notable dental hx. (+) Teeth Intact, Dental Advisory Given   Pulmonary neg pulmonary ROS,  breath sounds clear to auscultation  Pulmonary exam normal       Cardiovascular hypertension, Pt. on medications + CAD and +CHF + dysrhythmias Atrial Fibrillation Rhythm:Irregular Rate:Normal     Neuro/Psych negative neurological ROS  negative psych ROS   GI/Hepatic negative GI ROS, Neg liver ROS,   Endo/Other  diabetes, Type 2, Oral Hypoglycemic Agents  Renal/GU Renal disease  negative genitourinary   Musculoskeletal  (+) Arthritis -, Osteoarthritis,    Abdominal   Peds  Hematology negative hematology ROS (+)   Anesthesia Other Findings   Reproductive/Obstetrics negative OB ROS                            Anesthesia Physical Anesthesia Plan  ASA: III  Anesthesia Plan: MAC and Regional   Post-op Pain Management:    Induction: Intravenous  Airway Management Planned: Simple Face Mask  Additional Equipment:   Intra-op Plan:   Post-operative Plan: Extubation in OR  Informed Consent: I have reviewed the patients History and Physical, chart, labs and discussed the procedure including the risks, benefits and alternatives for the proposed anesthesia with the patient or authorized representative who has indicated his/her understanding and acceptance.   Dental advisory given  Plan Discussed with: CRNA  Anesthesia Plan Comments:         Anesthesia Quick Evaluation

## 2014-12-25 NOTE — Brief Op Note (Signed)
12/25/2014  3:46 PM  PATIENT:  Cory Marks  54 y.o. male  PRE-OPERATIVE DIAGNOSIS:  1.  NEUROPATHIC ULCER OF RIGHT 2ND TOE with osteomyelitis      2.  Flexor tendon contractures of the 3-5 toes  POST-OPERATIVE DIAGNOSIS:  Same  Procedure(s): 1.  RIGHT 2ND TOE AMPUTATION through the MTP joint 2.  RIGHT 3,4,5 percutaneous FLEXOR TENDON RELEASE   SURGEON:  Wylene Simmer, MD  ASSISTANT: n/a  ANESTHESIA:  MAC, regional  EBL:  minimal   TOURNIQUET:   Total Tourniquet Time Documented: Leg (Right) - 9 minutes Total: Leg (Right) - 9 minutes  COMPLICATIONS:  None apparent  DISPOSITION:  Extubated, awake and stable to recovery.  DICTATION ID:  [id number]

## 2014-12-25 NOTE — Anesthesia Postprocedure Evaluation (Signed)
  Anesthesia Post-op Note  Patient: Cory Marks  Procedure(s) Performed: Procedure(s): RIGHT 2ND TOE AMPUTATION  (Right) RIGHT 3,4,5 FLEXOR TENDON RELEASE  (Right)  Patient Location: PACU  Anesthesia Type: MAC  Level of Consciousness: awake and alert   Airway and Oxygen Therapy: Patient Spontanous Breathing  Post-op Pain: none  Post-op Assessment: Post-op Vital signs reviewed, Patient's Cardiovascular Status Stable and Respiratory Function Stable  Post-op Vital Signs: Reviewed  Filed Vitals:   12/25/14 1700  BP: 127/94  Pulse: 39  Temp:   Resp: 13    Complications: No apparent anesthesia complications

## 2014-12-25 NOTE — H&P (Signed)
Cory Marks is an 54 y.o. male.   Chief Complaint:  Right 2nd toe osteomyelitis and 3-5th toe flexion contractures HPI:  54 y/o male with PMH of neuropathy of the right foot has a non healing ulcer of the right 2nd toe.  He has palpable bone at the base of the ulcer.  He presents now for amputation of the 2nd toe and flexor tendon lengthening of the 3-5th toes.  Past Medical History  Diagnosis Date  . Hypertension   . Diabetes mellitus   . Bell's palsy   . Cancer     Mantle Cell Cancer- S/P stem cell transplant  . History of splenectomy   . Polysubstance abuse   . Neuropathy     right leg due to football injury  . CAD (coronary artery disease)   . Sinus bradycardia   . CKD (chronic kidney disease) stage 3, GFR 30-59 ml/min   . Pre-diabetes 05/21/2014  . Hyperlipemia   . Dysrhythmia     atrial fib and atrial flutter  . Neuromuscular disorder     peripheral neuropathy    Past Surgical History  Procedure Laterality Date  . Splenectomy, total      mantle cell lymphoma  . Edentulation N/A y-2    prior to transplant  . Amputation Right 10/20/2014    Procedure: AMPUTATION RIGHT GREAT TOE;  Surgeon: Gaynelle Arabian, MD;  Location: [facility];  Service: Orthopedics;  Laterality: Right;    Family History  Problem Relation Age of Onset  . Hypertension Mother   . Diabetes Mother   . Cancer Father     pancreatic cancer  . Diabetes Father   . Heart disease Father   . Diabetes Sister    Social History:  reports that he has never smoked. He has never used smokeless tobacco. He reports that he drinks about 0.6 oz of alcohol per week. He reports that he uses illicit drugs (Marijuana).  Allergies:  Allergies  Allergen Reactions  . Oxycodone Itching and Palpitations    Medications Prior to Admission  Medication Sig Dispense Refill  . amLODipine (NORVASC) 10 MG tablet Take 1 tablet (10 mg total) by mouth every morning. 90 tablet 1  . apixaban (ELIQUIS) 5 MG TABS tablet Take 1 tablet (5  mg total) by mouth 2 (two) times daily. 60 tablet 0  . gabapentin (NEURONTIN) 300 MG capsule Take 2 capsules (600 mg total) by mouth 2 (two) times daily. 120 capsule 0  . HYDROcodone-acetaminophen (NORCO/VICODIN) 5-325 MG per tablet Take 1-2 tablets by mouth every 4 (four) hours as needed for moderate pain. 30 tablet 0  . metFORMIN (GLUCOPHAGE XR) 500 MG 24 hr tablet Take 1 tablet (500 mg total) by mouth daily with breakfast. 30 tablet 11  . penicillin v potassium (VEETID) 500 MG tablet Take 500 mg by mouth 2 (two) times daily.    Marland Kitchen pyridOXINE (VITAMIN B-6) 100 MG tablet Take 100 mg by mouth every morning.     . rosuvastatin (CRESTOR) 20 MG tablet Take 1 tablet (20 mg total) by mouth daily. 90 tablet 3    Results for orders placed or performed during the hospital encounter of 12/25/14 (from the past 48 hour(s))  Glucose, capillary     Status: None   Collection Time: 12/25/14  2:00 PM  Result Value Ref Range   Glucose-Capillary 72 65 - 99 mg/dL  Hemoglobin-hemacue, POC     Status: None   Collection Time: 12/25/14  2:01 PM  Result Value Ref Range  Hemoglobin 13.6 13.0 - 17.0 g/dL   No results found.  ROS  No recent f/c/n/v/wt loss  Blood pressure 136/99, pulse 68, temperature 98.6 F (37 C), temperature source Oral, resp. rate 14, height 6' (1.829 m), weight 105.235 kg (232 lb), SpO2 99 %. Physical Exam  wn wd male in nad.  A and O x 4.  Mood and affect normal.  EOMI.  Resp unlabored.  R foot with swollen 2nd toe.  Skin o/w intact.  Palpable dp and pt pulses.  No sens to LT at dorsal foot.  Decreased sens to LT at the plantar nerves.  5/5 strength in PF at the ankle.  Assessment/Plan R 2nd toe neuropathic ulcer and osteomyelitis and 3-5 toe flexion contractures - to OR for perc flexor tendon lengthening and 2nd toe amputation on the right.  The risks and benefits of the alternative treatment options have been discussed in detail.  The patient wishes to proceed with surgery and  specifically understands risks of bleeding, infection, nerve damage, blood clots, need for additional surgery, amputation and death.   Wylene Simmer 01/17/2015, 2:56 PM

## 2014-12-26 ENCOUNTER — Encounter (HOSPITAL_BASED_OUTPATIENT_CLINIC_OR_DEPARTMENT_OTHER): Payer: Self-pay | Admitting: Orthopedic Surgery

## 2014-12-27 NOTE — Op Note (Signed)
NAME:  Cory Marks, Cory Marks NO.:  [id number]  MEDICAL RECORD NO.:  [id number]  LOCATION:                                FACILITY:  [facility]  PHYSICIAN:  Wylene Simmer, MD        DATE OF BIRTH:  01/05/1961  DATE OF PROCEDURE:  12/25/2014 DATE OF DISCHARGE:  12/25/2014                              OPERATIVE REPORT   PREOPERATIVE DIAGNOSES: 1. Neuropathic ulcer of the right second toe with osteomyelitis. 2. Flexor tendon contractures of the third, fourth and fifth toes.  POSTOPERATIVE DIAGNOSES: 1. Neuropathic ulcer of the right second toe with osteomyelitis. 2. Flexor tendon contractures of the third, fourth and fifth toes.  PROCEDURES: 1. Right second toe amputation through the metatarsophalangeal joint. 2. Right third, fourth and fifth toe percutaneous flexor tendon     releases through separate incisions.  SURGEON:  Wylene Simmer, MD  ANESTHESIA:  MAC, regional.  ESTIMATED BLOOD LOSS:  Minimal.  TOURNIQUET TIME:  9 minutes with an ankle Esmarch.  COMPLICATIONS:  None apparent.  DISPOSITION:  Extubated, awake and stable to recovery.  INDICATIONS FOR PROCEDURE:  The patient is a 54 year old male with a history of neuropathy at right lower extremity after a nerve injury as a teenager.  He developed an ulcer on his hallux several months ago and underwent amputation by Dr. Wynelle Link.  He healed this wound well, but then developed an ulcer at the tip of the second toe.  Despite extensive wound care and protection of that toe, he developed osteomyelitis.  He presents today for second toe amputation.  He also has flexor tendon contractures of the third, fourth and fifth toes, which increased the likelihood of developing transfer lesions on these toes after amputation of the second toe.  He presents today for amputation of the second toe as well as flexor tendon releases of the remaining toes.  He understands the risks and benefits, the alternative treatment options, and  elects surgical treatment.  He specifically understands risks of bleeding, infection, nerve damage, blood clots, need for additional surgery, continued pain, revision amputation and death.  PROCEDURE IN DETAIL:  After preoperative consent was obtained and the correct operative sites were identified, the patient was brought to the operating room and placed supine on the operating table.  The patient's right lower extremity was prepped and draped in standard sterile fashion after IV sedation was administered.  Foot was exsanguinated and a 4-inch Esmarch tourniquet was wrapped around the ankle.  A 15-blade was used to perform percutaneous flexor tenotomies at the flexor digitorum longus tendon of the third, fourth and fifth toes.  These were done through the distal flexion crease.  The toes could all be positioned neutrally after release of those tendons.  Attention was then turned to the second toe where a racquet-style incision was marked around the base of the toe.  The incision was made, sharp dissection was carried down through the skin and subcutaneous tissue to the level of the proximal phalanx.  Subperiosteal dissection was then carried along to the MTP joint.  The toe was disarticulated through the MTP joint and passed off the field.  The neurovascular bundles were cauterized.  The remaining soft tissue appeared healthy with no sign of any infection.  Wound was irrigated copiously. Horizontal mattress sutures of 2-0 nylon were used to close the skin incision.  Sterile dressings were applied followed by a compression wrap.  Tourniquet was released at 9 minutes.  The patient was awakened from anesthesia and transported to the recovery room in stable condition.  FOLLOWUP PLAN:  The patient will be weightbearing as tolerated in a flat postop shoe.  He will follow up with me in 2 weeks for suture removal and wound check.     Wylene Simmer, MD     JH/MEDQ  D:  12/25/2014  T:   12/26/2014  Job:  [id number]

## 2014-12-31 ENCOUNTER — Encounter (HOSPITAL_BASED_OUTPATIENT_CLINIC_OR_DEPARTMENT_OTHER): Payer: Self-pay | Admitting: Orthopedic Surgery

## 2015-02-11 ENCOUNTER — Other Ambulatory Visit: Payer: Self-pay | Admitting: Family Medicine

## 2015-02-11 MED ORDER — APIXABAN 5 MG PO TABS: 5.0000 mg | ORAL_TABLET | Freq: Two times a day (BID) | ORAL | Status: DC

## 2015-02-11 NOTE — Telephone Encounter (Signed)
Refill for eliquis sent into pharmacy. Thanks!  

## 2015-02-24 ENCOUNTER — Other Ambulatory Visit: Payer: Self-pay

## 2015-02-24 MED ORDER — APIXABAN 5 MG PO TABS: 5.0000 mg | ORAL_TABLET | Freq: Two times a day (BID) | ORAL | Status: DC

## 2015-03-04 ENCOUNTER — Other Ambulatory Visit: Payer: Self-pay | Admitting: Internal Medicine

## 2015-03-13 ENCOUNTER — Encounter: Payer: Self-pay | Admitting: Family Medicine

## 2015-03-13 ENCOUNTER — Ambulatory Visit (INDEPENDENT_AMBULATORY_CARE_PROVIDER_SITE_OTHER): Payer: Medicare HMO | Admitting: Family Medicine

## 2015-03-13 VITALS — BP 130/94 | HR 63 | Temp 98.1°F | Resp 18 | Ht 72.0 in | Wt 231.0 lb

## 2015-03-13 DIAGNOSIS — I10 Essential (primary) hypertension: Secondary | ICD-10-CM

## 2015-03-13 LAB — GLUCOSE, CAPILLARY: Glucose-Capillary: 71 mg/dL (ref 65–99)

## 2015-03-13 MED ORDER — LISINOPRIL 10 MG PO TABS: 10.0000 mg | ORAL_TABLET | Freq: Every day | ORAL | Status: DC

## 2015-03-13 NOTE — Patient Instructions (Signed)
Stop metformin Start lisinopril We will check on eye referral.

## 2015-03-13 NOTE — Progress Notes (Signed)
Patient ID: Cory Marks, male   DOB: 02/11/1961, 54 y.o.   MRN: [id number]   Cory Marks, is a 54 y.o. male  NTI:[id number]    DOB - 1961/01/08  CC:  Chief Complaint  Patient presents with  . Follow-up        HPI: Patient presents for follow-up. He was seen in May by Dr. Zigmund Daniel and was instructed to some back for fasting lipids. He is just now returning after 3 months.  He has a history of diabetes in past, which resolved with weight loss. He is currently considered Pre-diabetic.  She was started on meformin 500 bid. She has CKD with a recent GFR of 39. He has a history Mantle Cell cancer in remission for about a year. He has neuropathy of right foot and leg due to a previous injury to his leg in the past. He has his great and 2nd tow of his right foot amputated.  He areports needing orthopedic shoes and leaves paper work.He has a history of atrial fib and flutter and CAD. He has had a spleenectory.  His medications have been reviewed and are listed below. His last A1C in March was 6.2     Allergies  Allergen Reactions  . Oxycodone Itching and Palpitations    Past Medical History  Diagnosis Date  . Hypertension   . Diabetes mellitus   . Bell's palsy   . Cancer     Mantle Cell Cancer- S/P stem cell transplant  . History of splenectomy   . Polysubstance abuse   . Neuropathy     right leg due to football injury  . CAD (coronary artery disease)   . Sinus bradycardia   . CKD (chronic kidney disease) stage 3, GFR 30-59 ml/min   . Pre-diabetes 05/21/2014  . Hyperlipemia   . Dysrhythmia     atrial fib and atrial flutter  . Neuromuscular disorder     peripheral neuropathy   Cory Marks's family history includes Cancer in his father; Diabetes in his father, mother, and sister; Heart disease in his father; Hypertension in his mother.  History   Social History  . Marital Status: Married    Spouse Name: N/A  . Number of Children: N/A  . Years of Education: N/A   Occupational  History  . Not on file.   Social History Main Topics  . Smoking status: Never Smoker   . Smokeless tobacco: Never Used  . Alcohol Use: 0.6 oz/week    1 Cans of beer per week     Comment: does not drink every day.  no history of withdrawal   . Drug Use: Yes    Special: Marijuana     Comment: Cocaine, prescription drug abuse, etc pt states none in several months  . Sexual Activity:    Partners: Female   Other Topics Concern  . Not on file   Social History Narrative   Past Surgical History  Procedure Laterality Date  . Splenectomy, total      mantle cell lymphoma  . Edentulation N/A y-2    prior to transplant  . Amputation Right 10/20/2014    Procedure: AMPUTATION RIGHT GREAT TOE;  Surgeon: Gaynelle Arabian, MD;  Location: [facility];  Service: Orthopedics;  Laterality: Right;  . Amputation Right 12/25/2014    Procedure: RIGHT 2ND TOE AMPUTATION ;  Surgeon: Wylene Simmer, MD;  Location: [facility];  Service: Orthopedics;  Laterality: Right;  . Tenolysis Right 12/25/2014    Procedure: RIGHT 3,4,5 FLEXOR  TENDON RELEASE ;  Surgeon: Wylene Simmer, MD;  Location: [facility];  Service: Orthopedics;  Laterality: Right;       ROS:  GEN:   Denies fever, chills,WT loss, loss of appetite Skin:   Denies lesions or rashes HENT:   Denies  earache, epistaxis, sore throat, or neck pain, or headaches EYES:   Denies eye pain or drainage.               LUNGS:  Denies SOB with rest or walking; couging, choking CV:   Denies CP or palpitations ABD:   Denies abdominal pain, nausea,and vomiting, diarrhea . Positive for heartburn GU:   Denies frequency, urgency, dysuria          EXT:    Denies muscle spasms or swelling; no pain in lower ext,no unilateral  weakness  NEURO:   Denies numbness or tingling, denies seizures  Objective:  Filed Vitals:   03/13/15 0903  BP: 130/94  Pulse: 63  Temp: 98.1 F (36.7 C)  Resp: 18  Height: 6' (1.829 m)  Weight: 231 lb (104.781 kg)   SpO2: 96%    Physical Exam:  General:   Well-developed, well-nourished, in no acute distress Skin:      Warm and dry, no significant rashes.     Head :    Normocephalic, atraumatic, no pallor, Eyes:      No icterus, drainage, PERL, EOMS intact Ears:      Clear bilaterally, TMs within normal limits. Nose:   nares patent w/o significant congestion or inflammation   Neck:      Supple FROM w/o adenopathy, tenderness, or thyroidomegaly No JVD   or tracheal diviation Heart:     Normal  RRR,without M,G,R Lungs:    Clear to auscultation bilaterally. No increase in respiratory effort.No wheezes, rales or stridor Abdomen:  Soft, nontender, nondistended, positive bowel sounds, no guarding or  rebound Exetremeties:  No pedal edema.FROM, 2+pedal pulses, No unilateral weakness.   Neuro:   Alert, oriented, appropriate, non-focal.  Pertinent Lab Results:  Lab Results  Component Value Date   WBC 5.7 10/21/2014   HGB 13.6 12/25/2014   HCT 38.4* 10/21/2014   MCV 96.7 10/21/2014   PLT 228 10/21/2014     Chemistry      Component Value Date/Time   NA 141 12/11/2014 0947   K 3.8 12/11/2014 0947   CL 101 12/11/2014 0947   CO2 24 12/11/2014 0947   BUN 27* 12/11/2014 0947   CREATININE 2.15* 12/11/2014 0947   CREATININE 1.99* 10/21/2014 0405      Component Value Date/Time   CALCIUM 9.4 12/11/2014 0947   ALKPHOS 46 10/19/2014 2318   AST 30 10/19/2014 2318   ALT 17 10/19/2014 2318   BILITOT 0.7 10/19/2014 2318      Lab Results  Component Value Date   HGBA1C 6.2* 10/19/2014    Medications: Prior to Admission medications   Medication Sig Start Date End Date Taking? Authorizing Provider  amLODipine (NORVASC) 10 MG tablet TAKE 1 TABLET EVERY MORNING 03/05/15  Yes Micheline Chapman, NP  gabapentin (NEURONTIN) 300 MG capsule Take 2 capsules (600 mg total) by mouth 2 (two) times daily. 10/18/14  Yes Janece Canterbury, MD  penicillin v potassium (VEETID) 500 MG tablet Take 500 mg by mouth 2 (two)  times daily.   Yes Historical Provider, MD  pyridOXINE (VITAMIN B-6) 100 MG tablet Take 100 mg by mouth every morning.    Yes Historical Provider, MD  rosuvastatin (  CRESTOR) 20 MG tablet Take 1 tablet (20 mg total) by mouth daily. 10/16/14  Yes Leana Gamer, MD  apixaban (ELIQUIS) 5 MG TABS tablet Take 1 tablet (5 mg total) by mouth 2 (two) times daily. Patient not taking: Reported on 03/13/2015 02/24/15   Micheline Chapman, NP  HYDROcodone-acetaminophen (NORCO/VICODIN) 5-325 MG per tablet Take 1-2 tablets by mouth every 4 (four) hours as needed for moderate pain. Patient not taking: Reported on 03/13/2015 10/21/14   Janece Canterbury, MD  lisinopril (PRINIVIL,ZESTRIL) 10 MG tablet Take 1 tablet (10 mg total) by mouth daily. 03/13/15   Micheline Chapman, NP      Assessment/Plan   Pre-diabetes - Advise to continue to control using diet and exercise.  Stage III renal failure -discontinue metformin -add lisinopril 10 mg  Hypertension, uncontrolled -addition of lisinopril as above   -  Follow up: 3 months  The patient was given clear instructions to go to ER or return to medical center if symptoms don't improve, worsen or new problems develop. The patient verbalized understanding.     Micheline Chapman, FNP,BC 03/13/2015, 11:15 AM

## 2015-03-17 ENCOUNTER — Other Ambulatory Visit: Payer: Self-pay | Admitting: Family Medicine

## 2015-03-17 DIAGNOSIS — E119 Type 2 diabetes mellitus without complications: Secondary | ICD-10-CM

## 2015-03-19 ENCOUNTER — Ambulatory Visit: Payer: Medicare HMO | Admitting: Internal Medicine

## 2015-04-27 ENCOUNTER — Other Ambulatory Visit: Payer: Self-pay | Admitting: Internal Medicine

## 2015-05-04 ENCOUNTER — Telehealth: Payer: Self-pay | Admitting: Family Medicine

## 2015-05-04 NOTE — Telephone Encounter (Signed)
Patient states Cory Marks is requesting Pricsilla Lindvall call to authorize DOT physical examination since practice does not offer them. [phone].

## 2015-06-15 ENCOUNTER — Encounter: Payer: Self-pay | Admitting: Family Medicine

## 2015-06-15 ENCOUNTER — Ambulatory Visit (INDEPENDENT_AMBULATORY_CARE_PROVIDER_SITE_OTHER): Payer: Medicare HMO | Admitting: Family Medicine

## 2015-06-15 VITALS — BP 129/76 | HR 57 | Temp 97.6°F | Resp 16 | Ht 72.0 in | Wt 229.0 lb

## 2015-06-15 DIAGNOSIS — Z23 Encounter for immunization: Secondary | ICD-10-CM

## 2015-06-15 DIAGNOSIS — R7303 Prediabetes: Secondary | ICD-10-CM

## 2015-06-15 DIAGNOSIS — E785 Hyperlipidemia, unspecified: Secondary | ICD-10-CM

## 2015-06-15 DIAGNOSIS — I10 Essential (primary) hypertension: Secondary | ICD-10-CM | POA: Diagnosis not present

## 2015-06-15 DIAGNOSIS — Z9081 Acquired absence of spleen: Secondary | ICD-10-CM

## 2015-06-15 DIAGNOSIS — Q8901 Asplenia (congenital): Secondary | ICD-10-CM | POA: Insufficient documentation

## 2015-06-15 DIAGNOSIS — I4892 Unspecified atrial flutter: Secondary | ICD-10-CM

## 2015-06-15 LAB — POCT URINALYSIS DIP (DEVICE)
Bilirubin Urine: NEGATIVE
Glucose, UA: NEGATIVE mg/dL
Hgb urine dipstick: NEGATIVE
Ketones, ur: NEGATIVE mg/dL
Leukocytes, UA: NEGATIVE
NITRITE: NEGATIVE
Protein, ur: NEGATIVE mg/dL
Specific Gravity, Urine: >=1.03 (ref 1.005–1.030)
UROBILINOGEN UA: 0.2 mg/dL (ref 0.0–1.0)
pH: 5.5 (ref 5.0–8.0)

## 2015-06-15 NOTE — Patient Instructions (Signed)
DASH Eating Plan °DASH stands for "Dietary Approaches to Stop Hypertension." The DASH eating plan is a healthy eating plan that has been shown to reduce high blood pressure (hypertension). Additional health benefits may include reducing the risk of type 2 diabetes mellitus, heart disease, and stroke. The DASH eating plan may also help with weight loss. °WHAT DO I NEED TO KNOW ABOUT THE DASH EATING PLAN? °For the DASH eating plan, you will follow these general guidelines: °· Choose foods with a percent daily value for sodium of less than 5% (as listed on the food label). °· Use salt-free seasonings or herbs instead of table salt or sea salt. °· Check with your health care provider or pharmacist before using salt substitutes. °· Eat lower-sodium products, often labeled as "lower sodium" or "no salt added." °· Eat fresh foods. °· Eat more vegetables, fruits, and low-fat dairy products. °· Choose whole grains. Look for the word "whole" as the first word in the ingredient list. °· Choose fish and skinless chicken or turkey more often than red meat. Limit fish, poultry, and meat to 6 oz (170 g) each day. °· Limit sweets, desserts, sugars, and sugary drinks. °· Choose heart-healthy fats. °· Limit cheese to 1 oz (28 g) per day. °· Eat more home-cooked food and less restaurant, buffet, and fast food. °· Limit fried foods. °· Cook foods using methods other than frying. °· Limit canned vegetables. If you do use them, rinse them well to decrease the sodium. °· When eating at a restaurant, ask that your food be prepared with less salt, or no salt if possible. °WHAT FOODS CAN I EAT? °Seek help from a dietitian for individual calorie needs. °Grains °Whole grain or whole wheat bread. Brown rice. Whole grain or whole wheat pasta. Quinoa, bulgur, and whole grain cereals. Low-sodium cereals. Corn or whole wheat flour tortillas. Whole grain cornbread. Whole grain crackers. Low-sodium crackers. °Vegetables °Fresh or frozen vegetables  (raw, steamed, roasted, or grilled). Low-sodium or reduced-sodium tomato and vegetable juices. Low-sodium or reduced-sodium tomato sauce and paste. Low-sodium or reduced-sodium canned vegetables.  °Fruits °All fresh, canned (in natural juice), or frozen fruits. °Meat and Other Protein Products °Ground beef (85% or leaner), grass-fed beef, or beef trimmed of fat. Skinless chicken or turkey. Ground chicken or turkey. Pork trimmed of fat. All fish and seafood. Eggs. Dried beans, peas, or lentils. Unsalted nuts and seeds. Unsalted canned beans. °Dairy °Low-fat dairy products, such as skim or 1% milk, 2% or reduced-fat cheeses, low-fat ricotta or cottage cheese, or plain low-fat yogurt. Low-sodium or reduced-sodium cheeses. °Fats and Oils °Tub margarines without trans fats. Light or reduced-fat mayonnaise and salad dressings (reduced sodium). Avocado. Safflower, olive, or canola oils. Natural peanut or almond butter. °Other °Unsalted popcorn and pretzels. °The items listed above may not be a complete list of recommended foods or beverages. Contact your dietitian for more options. °WHAT FOODS ARE NOT RECOMMENDED? °Grains °White bread. White pasta. White rice. Refined cornbread. Bagels and croissants. Crackers that contain trans fat. °Vegetables °Creamed or fried vegetables. Vegetables in a cheese sauce. Regular canned vegetables. Regular canned tomato sauce and paste. Regular tomato and vegetable juices. °Fruits °Dried fruits. Canned fruit in light or heavy syrup. Fruit juice. °Meat and Other Protein Products °Fatty cuts of meat. Ribs, chicken wings, bacon, sausage, bologna, salami, chitterlings, fatback, hot dogs, bratwurst, and packaged luncheon meats. Salted nuts and seeds. Canned beans with salt. °Dairy °Whole or 2% milk, cream, half-and-half, and cream cheese. Whole-fat or sweetened yogurt. Full-fat   cheeses or blue cheese. Nondairy creamers and whipped toppings. Processed cheese, cheese spreads, or cheese  curds. °Condiments °Onion and garlic salt, seasoned salt, table salt, and sea salt. Canned and packaged gravies. Worcestershire sauce. Tartar sauce. Barbecue sauce. Teriyaki sauce. Soy sauce, including reduced sodium. Steak sauce. Fish sauce. Oyster sauce. Cocktail sauce. Horseradish. Ketchup and mustard. Meat flavorings and tenderizers. Bouillon cubes. Hot sauce. Tabasco sauce. Marinades. Taco seasonings. Relishes. °Fats and Oils °Butter, stick margarine, lard, shortening, ghee, and bacon fat. Coconut, palm kernel, or palm oils. Regular salad dressings. °Other °Pickles and olives. Salted popcorn and pretzels. °The items listed above may not be a complete list of foods and beverages to avoid. Contact your dietitian for more information. °WHERE CAN I FIND MORE INFORMATION? °National Heart, Lung, and [employer] Institute: www.nhlbi.nih.gov/health/health-topics/topics/dash/ °  °This information is not intended to replace advice given to you by your health care provider. Make sure you discuss any questions you have with your health care provider. °  °Document Released: 07/14/2011 Document Revised: 08/15/2014 Document Reviewed: 05/29/2013 °Elsevier Interactive Patient Education ©2016 Elsevier Inc. ° °Hypertension °Hypertension, commonly called high blood pressure, is when the force of blood pumping through your arteries is too strong. Your arteries are the blood vessels that carry blood from your heart throughout your body. A blood pressure reading consists of a higher number over a lower number, such as 110/72. The higher number (systolic) is the pressure inside your arteries when your heart pumps. The lower number (diastolic) is the pressure inside your arteries when your heart relaxes. Ideally you want your blood pressure below 120/80. °Hypertension forces your heart to work harder to pump blood. Your arteries may become narrow or stiff. Having untreated or uncontrolled hypertension can cause heart attack, stroke, kidney  disease, and other problems. °RISK FACTORS °Some risk factors for high blood pressure are controllable. Others are not.  °Risk factors you cannot control include:  °· Race. You may be at higher risk if you are African American. °· Age. Risk increases with age. °· Gender. Men are at higher risk than women before age 45 years. After age 65, women are at higher risk than men. °Risk factors you can control include: °· Not getting enough exercise or physical activity. °· Being overweight. °· Getting too much fat, sugar, calories, or salt in your diet. °· Drinking too much alcohol. °SIGNS AND SYMPTOMS °Hypertension does not usually cause signs or symptoms. Extremely high blood pressure (hypertensive crisis) may cause headache, anxiety, shortness of breath, and nosebleed. °DIAGNOSIS °To check if you have hypertension, your health care provider will measure your blood pressure while you are seated, with your arm held at the level of your heart. It should be measured at least twice using the same arm. Certain conditions can cause a difference in blood pressure between your right and left arms. A blood pressure reading that is higher than normal on one occasion does not mean that you need treatment. If it is not clear whether you have high blood pressure, you may be asked to return on a different day to have your blood pressure checked again. Or, you may be asked to monitor your blood pressure at home for 1 or more weeks. °TREATMENT °Treating high blood pressure includes making lifestyle changes and possibly taking medicine. Living a healthy lifestyle can help lower high blood pressure. You may need to change some of your habits. °Lifestyle changes may include: °· Following the DASH diet. This diet is high in fruits, vegetables, and whole   grains. It is low in salt, red meat, and added sugars. °· Keep your sodium intake below 2,300 mg per day. °· Getting at least 30-45 minutes of aerobic exercise at least 4 times per  week. °· Losing weight if necessary. °· Not smoking. °· Limiting alcoholic beverages. °· Learning ways to reduce stress. °Your health care provider may prescribe medicine if lifestyle changes are not enough to get your blood pressure under control, and if one of the following is true: °· You are 18-59 years of age and your systolic blood pressure is above 140. °· You are 60 years of age or older, and your systolic blood pressure is above 150. °· Your diastolic blood pressure is above 90. °· You have diabetes, and your systolic blood pressure is over 140 or your diastolic blood pressure is over 90. °· You have kidney disease and your blood pressure is above 140/90. °· You have heart disease and your blood pressure is above 140/90. °Your personal target blood pressure may vary depending on your medical conditions, your age, and other factors. °HOME CARE INSTRUCTIONS °· Have your blood pressure rechecked as directed by your health care provider.   °· Take medicines only as directed by your health care provider. Follow the directions carefully. Blood pressure medicines must be taken as prescribed. The medicine does not work as well when you skip doses. Skipping doses also puts you at risk for problems. °· Do not smoke.   °· Monitor your blood pressure at home as directed by your health care provider.  °SEEK MEDICAL CARE IF:  °· You think you are having a reaction to medicines taken. °· You have recurrent headaches or feel dizzy. °· You have swelling in your ankles. °· You have trouble with your vision. °SEEK IMMEDIATE MEDICAL CARE IF: °· You develop a severe headache or confusion. °· You have unusual weakness, numbness, or feel faint. °· You have severe chest or abdominal pain. °· You vomit repeatedly. °· You have trouble breathing. °MAKE SURE YOU:  °· Understand these instructions. °· Will watch your condition. °· Will get help right away if you are not doing well or get worse. °  °This information is not intended to  replace advice given to you by your health care provider. Make sure you discuss any questions you have with your health care provider. °  °Document Released: 07/25/2005 Document Revised: 12/09/2014 Document Reviewed: 05/17/2013 °Elsevier Interactive Patient Education ©2016 Elsevier Inc. ° °

## 2015-06-15 NOTE — Progress Notes (Signed)
Subjective:    Patient ID: Cory Marks, male    DOB: Sep 06, 1960, 54 y.o.   MRN: [id number]  HPI Cory Marks, a 54 year old male with a history of hypertension, atrial flutter, and hyperlipidemia presents for a 3 month follow-up of hypertension. Patient maintains that he has been taking medications consistently. He has not been exercising or following a low sodium diet. Patient denies chest pain, dyspnea, fatigue, near-syncope, orthopnea, palpitations and tachypnea.  Cardiovascular risk factors include: sedentary lifestyle. Patient also has a history of atrial flutter, he is on anticoagulation therapy.  Past Medical History  Diagnosis Date  . Hypertension   . Diabetes mellitus   . Bell's palsy   . Cancer     Mantle Cell Cancer- S/P stem cell transplant  . History of splenectomy   . Polysubstance abuse   . Neuropathy     right leg due to football injury  . CAD (coronary artery disease)   . Sinus bradycardia   . CKD (chronic kidney disease) stage 3, GFR 30-59 ml/min   . Pre-diabetes 05/21/2014  . Hyperlipemia   . Dysrhythmia     atrial fib and atrial flutter  . Neuromuscular disorder     peripheral neuropathy   Social History   Social History  . Marital Status: Married    Spouse Name: N/A  . Number of Children: N/A  . Years of Education: N/A   Occupational History  . Not on file.   Social History Main Topics  . Smoking status: Never Smoker   . Smokeless tobacco: Never Used  . Alcohol Use: 0.6 oz/week    1 Cans of beer per week     Comment: does not drink every day.  no history of withdrawal   . Drug Use: Yes    Special: Marijuana     Comment: Cocaine, prescription drug abuse, etc pt states none in several months  . Sexual Activity:    Partners: Female   Other Topics Concern  . Not on file   Social History Narrative   Allergies  Allergen Reactions  . Oxycodone Itching and Palpitations    Immunization History  Administered Date(s) Administered  . DTaP  05/30/2013  . Influenza,inj,Quad PF,36+ Mos 05/30/2014  . MMR 05/30/2013  . Pneumococcal Conjugate-13 12/11/2014  . Pneumococcal-Unspecified 05/30/2013  . Tdap 05/30/2013   Review of Systems  Constitutional: Negative.   HENT: Negative.   Eyes: Negative.   Respiratory: Negative.   Cardiovascular: Negative.   Gastrointestinal: Negative.   Endocrine: Negative.  Negative for polydipsia, polyphagia and polyuria.  Genitourinary: Negative.   Musculoskeletal: Negative.        Previous amputation to right toes.   Skin: Negative.   Allergic/Immunologic: Negative.   Neurological: Negative.   Hematological: Negative.   Psychiatric/Behavioral: Negative.        Objective:   Physical Exam  Constitutional: He is oriented to person, place, and time. He appears well-developed and well-nourished.  HENT:  Head: Normocephalic and atraumatic.  Right Ear: External ear normal.  Left Ear: External ear normal.  Mouth/Throat: Oropharynx is clear and moist.  Eyes: Conjunctivae and EOM are normal. Pupils are equal, round, and reactive to light.  Neck: Normal range of motion. Neck supple.  Cardiovascular: Normal rate, regular rhythm, normal heart sounds and intact distal pulses.   Pulmonary/Chest: Effort normal and breath sounds normal.  Abdominal: Soft. Bowel sounds are normal.  Musculoskeletal: Normal range of motion.  Neurological: He is alert and oriented to person, place,  and time. He has normal reflexes.  Skin: Skin is warm and dry.  Psychiatric: He has a normal mood and affect. His behavior is normal. Judgment and thought content normal.      BP 129/76 mmHg  Pulse 57  Temp(Src) 97.6 F (36.4 C) (Oral)  Resp 16  Ht 6' (1.829 Marks)  Wt 229 lb (103.874 kg)  BMI 31.05 kg/m2  Assessment & Plan:  1. Essential hypertension Blood pressure is at goal on current medication regimen. The patient is asked to make an attempt to improve diet and exercise patterns to aid in medical management of this  problem. Reviewed previous laboratory values  - POCT urinalysis dipstick - COMPLETE METABOLIC PANEL WITH GFR; Future - CBC with Differential; Future  2. Hyperlipidemia Continue Crestor as previously prescribed.  - Lipid Panel; Future  3. Pre-diabetes Reviewed previous hemoglobin A1C, values 6.1. Recommend a lowfat, low carbohydrate diet divided over 5-6 small meals, increase water intake to 6-8 glasses, and 150 minutes per week of cardiovascular exercise.   - Hemoglobin A1c; Future   4. Need for prophylactic vaccination and inoculation against influenza  - Flu Vaccine QUAD 36+ mos IM (Fluarix & Fluzone Quad PF   5. Atrial flutter, unspecified type ([place]) Continue Eloquis as previously prescribed.    6. H/O asplenia Patient has a history of lymphoma and had spleen removed previously. He is continued on Veetid, BID for prophylaxis. He is followed by Dr. Doran Durand, oncologist.     The patient was given clear instructions to go to ER or return to medical center if symptoms do not improve, worsen or new problems develop. The patient verbalized understanding. Will notify patient with laboratory results.     Cory Frankie M, FNP  RTC: 3 months for chronic conditions.

## 2015-06-16 ENCOUNTER — Other Ambulatory Visit (INDEPENDENT_AMBULATORY_CARE_PROVIDER_SITE_OTHER): Payer: Medicare HMO

## 2015-06-16 DIAGNOSIS — E785 Hyperlipidemia, unspecified: Secondary | ICD-10-CM

## 2015-06-16 DIAGNOSIS — I10 Essential (primary) hypertension: Secondary | ICD-10-CM | POA: Diagnosis not present

## 2015-06-16 DIAGNOSIS — R7303 Prediabetes: Secondary | ICD-10-CM

## 2015-06-16 LAB — CBC WITH DIFFERENTIAL/PLATELET
BASOS PCT: 1 % (ref 0–1)
Basophils Absolute: 0.1 10*3/uL (ref 0.0–0.1)
Eosinophils Absolute: 0.1 10*3/uL (ref 0.0–0.7)
Eosinophils Relative: 1 % (ref 0–5)
HCT: 41.7 % (ref 39.0–52.0)
Hemoglobin: 14.6 g/dL (ref 13.0–17.0)
Lymphocytes Relative: 30 % (ref 12–46)
Lymphs Abs: 2.4 10*3/uL (ref 0.7–4.0)
MCH: 32.7 pg (ref 26.0–34.0)
MCHC: 35 g/dL (ref 30.0–36.0)
MCV: 93.3 fL (ref 78.0–100.0)
MPV: 9.7 fL (ref 8.6–12.4)
Monocytes Absolute: 0.7 10*3/uL (ref 0.1–1.0)
Monocytes Relative: 9 % (ref 3–12)
NEUTROS ABS: 4.7 10*3/uL (ref 1.7–7.7)
Neutrophils Relative %: 59 % (ref 43–77)
Platelets: 161 10*3/uL (ref 150–400)
RBC: 4.47 MIL/uL (ref 4.22–5.81)
RDW: 14.6 % (ref 11.5–15.5)
WBC: 7.9 10*3/uL (ref 4.0–10.5)

## 2015-06-16 LAB — COMPLETE METABOLIC PANEL WITH GFR
ALT: 21 U/L (ref 9–46)
AST: 26 U/L (ref 10–35)
Albumin: 4.3 g/dL (ref 3.6–5.1)
Alkaline Phosphatase: 41 U/L (ref 40–115)
BUN: 25 mg/dL (ref 7–25)
CHLORIDE: 103 mmol/L (ref 98–110)
CO2: 23 mmol/L (ref 20–31)
Calcium: 9.8 mg/dL (ref 8.6–10.3)
Creat: 2.49 mg/dL — ABNORMAL HIGH (ref 0.70–1.33)
GFR, Est African American: 32 mL/min — ABNORMAL LOW (ref >=60)
GFR, Est Non African American: 28 mL/min — ABNORMAL LOW (ref >=60)
Glucose, Bld: 65 mg/dL (ref 65–99)
POTASSIUM: 4.1 mmol/L (ref 3.5–5.3)
Sodium: 138 mmol/L (ref 135–146)
Total Bilirubin: 1.1 mg/dL (ref 0.2–1.2)
Total Protein: 6.5 g/dL (ref 6.1–8.1)

## 2015-06-16 LAB — LIPID PANEL
Cholesterol: 263 mg/dL — ABNORMAL HIGH (ref 125–200)
HDL: 55 mg/dL (ref >=40)
LDL CALC: 182 mg/dL — AB (ref <130)
Total CHOL/HDL Ratio: 4.8 Ratio (ref <=5.0)
Triglycerides: 132 mg/dL (ref <150)
VLDL: 26 mg/dL (ref <30)

## 2015-06-16 LAB — HEMOGLOBIN A1C
HEMOGLOBIN A1C: 6.1 % — AB (ref <5.7)
MEAN PLASMA GLUCOSE: 128 mg/dL — AB (ref <117)

## 2015-06-17 ENCOUNTER — Telehealth: Payer: Self-pay

## 2015-06-17 NOTE — Telephone Encounter (Signed)
Called and spoke with patient regarding results and that we would send in nephrology referral. Patient verbalized understanding and had no questions at this time. Thanks!

## 2015-06-17 NOTE — Telephone Encounter (Signed)
-----   Message from Dorena Dew, [place] sent at 06/17/2015  7:16 AM EST ----- Please inform Cory Marks that his creatinine levels continue to be elevated, which is indicative of chronic kidney disease. We will send a referral to nephrology. He is to follow up in office as previously scheduled. Recommend a lowfat, low carbohydrate diet divided over 5-6 small meals, increase water intake to 6-8 glasses, and 150 minutes per week of cardiovascular exercise.   ----- Message -----    From: [facility]    Sent: 06/16/2015  10:57 PM      To: Dorena Dew, FNP

## 2015-06-17 NOTE — Telephone Encounter (Signed)
Received a fax from [facility] for records. Request has been put in health port book. Thanks!

## 2015-06-29 ENCOUNTER — Telehealth: Payer: Self-pay

## 2015-06-29 NOTE — Telephone Encounter (Signed)
Received fax for medical records release, this has been sent to help port rep. Thanks!

## 2015-08-09 HISTORY — PX: OTHER SURGICAL HISTORY: SHX169

## 2015-08-26 ENCOUNTER — Telehealth: Payer: Self-pay

## 2015-08-26 NOTE — Telephone Encounter (Signed)
Medical Records Request received from [facility]. This has been passed on to Health port.

## 2015-09-15 ENCOUNTER — Ambulatory Visit (INDEPENDENT_AMBULATORY_CARE_PROVIDER_SITE_OTHER): Payer: Medicare HMO | Admitting: Family Medicine

## 2015-09-15 VITALS — BP 116/84 | HR 62 | Temp 97.7°F | Resp 16 | Ht 72.0 in | Wt 210.0 lb

## 2015-09-15 DIAGNOSIS — E785 Hyperlipidemia, unspecified: Secondary | ICD-10-CM | POA: Diagnosis not present

## 2015-09-15 DIAGNOSIS — N183 Chronic kidney disease, stage 3 unspecified: Secondary | ICD-10-CM

## 2015-09-15 DIAGNOSIS — E118 Type 2 diabetes mellitus with unspecified complications: Secondary | ICD-10-CM | POA: Diagnosis not present

## 2015-09-15 DIAGNOSIS — I10 Essential (primary) hypertension: Secondary | ICD-10-CM

## 2015-09-15 LAB — POCT URINALYSIS DIP (DEVICE)
BILIRUBIN URINE: NEGATIVE
Glucose, UA: NEGATIVE mg/dL
Hgb urine dipstick: NEGATIVE
Ketones, ur: NEGATIVE mg/dL
Leukocytes, UA: NEGATIVE
Nitrite: NEGATIVE
PH: 6 (ref 5.0–8.0)
Protein, ur: NEGATIVE mg/dL
Specific Gravity, Urine: 1.025 (ref 1.005–1.030)
Urobilinogen, UA: 0.2 mg/dL (ref 0.0–1.0)

## 2015-09-15 NOTE — Progress Notes (Signed)
Subjective:    Patient ID: Cory Marks, male    DOB: [birth year], 55 y.o.   MRN: [id number]  Hypertension   Mr. Rashaud Ybarbo, a 55 year old male with a history of hypertension, type 2 diabetes mellitus, and hyperlipidemia presents for a 3 month follow-up of hypertension. Patient maintains that he has been taking medications consistently. He has not been exercising or following a low sodium diet. Patient denies chest pain, dyspnea, fatigue, near-syncope, orthopnea, palpitations and tachypnea.  Cardiovascular risk factors include: sedentary lifestyle. Patient also has a history of atrial flutter, he is on anticoagulation therapy.  Past Medical History  Diagnosis Date  . Hypertension   . Diabetes mellitus   . Bell's palsy   . Cancer ([place])     Mantle Cell Cancer- S/P stem cell transplant  . History of splenectomy   . Polysubstance abuse   . Neuropathy ([place])     right leg due to football injury  . CAD (coronary artery disease)   . Sinus bradycardia   . CKD (chronic kidney disease) stage 3, GFR 30-59 ml/min   . Pre-diabetes 05/21/2014  . Hyperlipemia   . Dysrhythmia     atrial fib and atrial flutter  . Neuromuscular disorder ([place])     peripheral neuropathy   Social History   Social History  . Marital Status: Married    Spouse Name: N/A  . Number of Children: N/A  . Years of Education: N/A   Occupational History  . Not on file.   Social History Main Topics  . Smoking status: Never Smoker   . Smokeless tobacco: Never Used  . Alcohol Use: 0.6 oz/week    1 Cans of beer per week     Comment: does not drink every day.  no history of withdrawal   . Drug Use: Yes    Special: Marijuana     Comment: Cocaine, prescription drug abuse, etc pt states none in several months  . Sexual Activity:    Partners: Female   Other Topics Concern  . Not on file   Social History Narrative   Allergies  Allergen Reactions  . Oxycodone Itching and Palpitations    Immunization History   Administered Date(s) Administered  . DTaP 05/30/2013  . Influenza,inj,Quad PF,36+ Mos 05/30/2014, 06/15/2015  . MMR 05/30/2013  . Pneumococcal Conjugate-13 12/11/2014  . Pneumococcal-Unspecified 05/30/2013  . Tdap 05/30/2013   Review of Systems  Constitutional: Negative.   HENT: Negative.   Eyes: Negative.   Respiratory: Negative.   Cardiovascular: Negative.   Gastrointestinal: Negative.   Endocrine: Negative.  Negative for polydipsia, polyphagia and polyuria.  Genitourinary: Negative.   Musculoskeletal: Negative.        Previous amputation to right toes.   Skin: Negative.   Allergic/Immunologic: Negative.   Neurological: Negative.   Hematological: Negative.   Psychiatric/Behavioral: Negative.        Objective:   Physical Exam  Constitutional: He is oriented to person, place, and time. He appears well-developed and well-nourished.  HENT:  Head: Normocephalic and atraumatic.  Right Ear: External ear normal.  Left Ear: External ear normal.  Mouth/Throat: Oropharynx is clear and moist.  Eyes: Conjunctivae and EOM are normal. Pupils are equal, round, and reactive to light.  Neck: Normal range of motion. Neck supple.  Cardiovascular: Normal rate, regular rhythm, normal heart sounds and intact distal pulses.   Pulmonary/Chest: Effort normal and breath sounds normal.  Abdominal: Soft. Bowel sounds are normal.  Musculoskeletal: Normal range of motion.  Neurological:  He is alert and oriented to person, place, and time. He has normal reflexes.  Skin: Skin is warm and dry.  Psychiatric: He has a normal mood and affect. His behavior is normal. Judgment and thought content normal.      BP 116/84 mmHg  Pulse 62  Temp(Src) 97.7 F (36.5 C)  Resp 16  Ht 6' (1.829 m)  Wt 210 lb (95.255 kg)  BMI 28.47 kg/m2  Assessment & Plan:   1. Essential hypertension Blood pressure is at goal on current medication regimen. The patient is asked to make an attempt to improve diet and  exercise patterns to aid in medical management of this problem. Reviewed previous laboratory values  - POCT urinalysis dipstick - COMPLETE METABOLIC PANEL WITH GFR  2. Controlled type 2 diabetes mellitus with complication, without long-term current use of insulin ([place]) Reviewed previous hemoglobin A1C, values 6.1. Recommend a lowfat, low carbohydrate diet divided over 5-6 small meals, increase water intake to 6-8 glasses, and 150 minutes per week of cardiovascular exercise.  - Hemoglobin A1c - COMPLETE METABOLIC PANEL WITH GFR  3. CKD (chronic kidney disease) stage 3, GFR 30-59 ml/min Previous GFR is 32, will check GFR today. Patient was under the care of nephrologist at [facility] but has been lost to followup. Reviewed previous notes. Patient was scheduled to follow up with Dr. Jolinda Croak in February (1 year ago), but did not f/u. I recommend that he calls to schedule an appt.   4. Hyperlipidemia - Lipid Panel     The patient was given clear instructions to go to ER or return to medical center if symptoms do not improve, worsen or new problems develop. The patient verbalized understanding. Will notify patient with laboratory results.     Rexford Prevo M, FNP  RTC: 3 months for chronic conditions.

## 2015-09-16 LAB — COMPLETE METABOLIC PANEL WITH GFR
ALT: 17 U/L (ref 9–46)
AST: 27 U/L (ref 10–35)
Albumin: 4.5 g/dL (ref 3.6–5.1)
Alkaline Phosphatase: 38 U/L — ABNORMAL LOW (ref 40–115)
BUN: 26 mg/dL — ABNORMAL HIGH (ref 7–25)
CHLORIDE: 103 mmol/L (ref 98–110)
CO2: 23 mmol/L (ref 20–31)
CREATININE: 1.94 mg/dL — AB (ref 0.70–1.33)
Calcium: 9.8 mg/dL (ref 8.6–10.3)
GFR, Est African American: 44 mL/min — ABNORMAL LOW (ref >=60)
GFR, Est Non African American: 38 mL/min — ABNORMAL LOW (ref >=60)
GLUCOSE: 72 mg/dL (ref 65–99)
Potassium: 4 mmol/L (ref 3.5–5.3)
SODIUM: 140 mmol/L (ref 135–146)
Total Bilirubin: 1 mg/dL (ref 0.2–1.2)
Total Protein: 6.4 g/dL (ref 6.1–8.1)

## 2015-09-16 LAB — HEMOGLOBIN A1C
HEMOGLOBIN A1C: 5.8 % — AB (ref <5.7)
Mean Plasma Glucose: 120 mg/dL — ABNORMAL HIGH (ref <117)

## 2015-09-16 LAB — LIPID PANEL
CHOL/HDL RATIO: 3.8 ratio (ref <=5.0)
Cholesterol: 286 mg/dL — ABNORMAL HIGH (ref 125–200)
HDL: 75 mg/dL (ref >=40)
LDL Cholesterol: 196 mg/dL — ABNORMAL HIGH (ref <130)
Triglycerides: 75 mg/dL (ref <150)
VLDL: 15 mg/dL (ref <30)

## 2015-09-17 ENCOUNTER — Encounter: Payer: Self-pay | Admitting: Family Medicine

## 2015-09-17 NOTE — Patient Instructions (Signed)

## 2015-12-14 ENCOUNTER — Ambulatory Visit: Payer: Medicare HMO | Admitting: Family Medicine

## 2015-12-28 ENCOUNTER — Encounter: Payer: Self-pay | Admitting: Family Medicine

## 2015-12-28 ENCOUNTER — Ambulatory Visit (INDEPENDENT_AMBULATORY_CARE_PROVIDER_SITE_OTHER): Payer: Medicare PPO | Admitting: Family Medicine

## 2015-12-28 VITALS — BP 132/106 | HR 62 | Temp 98.1°F | Resp 16 | Ht 72.0 in | Wt 215.0 lb

## 2015-12-28 DIAGNOSIS — E118 Type 2 diabetes mellitus with unspecified complications: Secondary | ICD-10-CM | POA: Diagnosis not present

## 2015-12-28 DIAGNOSIS — E785 Hyperlipidemia, unspecified: Secondary | ICD-10-CM | POA: Diagnosis not present

## 2015-12-28 DIAGNOSIS — I10 Essential (primary) hypertension: Secondary | ICD-10-CM | POA: Diagnosis not present

## 2015-12-28 DIAGNOSIS — I4892 Unspecified atrial flutter: Secondary | ICD-10-CM | POA: Diagnosis not present

## 2015-12-28 LAB — COMPLETE METABOLIC PANEL WITH GFR
ALT: 16 U/L (ref 9–46)
AST: 19 U/L (ref 10–35)
Albumin: 3.9 g/dL (ref 3.6–5.1)
Alkaline Phosphatase: 32 U/L — ABNORMAL LOW (ref 40–115)
BILIRUBIN TOTAL: 0.7 mg/dL (ref 0.2–1.2)
BUN: 16 mg/dL (ref 7–25)
CO2: 25 mmol/L (ref 20–31)
Calcium: 9.2 mg/dL (ref 8.6–10.3)
Chloride: 103 mmol/L (ref 98–110)
Creat: 1.45 mg/dL — ABNORMAL HIGH (ref 0.70–1.33)
GFR, Est African American: 62 mL/min (ref >=60)
GFR, Est Non African American: 54 mL/min — ABNORMAL LOW (ref >=60)
GLUCOSE: 84 mg/dL (ref 65–99)
Potassium: 4.2 mmol/L (ref 3.5–5.3)
SODIUM: 139 mmol/L (ref 135–146)
TOTAL PROTEIN: 5.7 g/dL — AB (ref 6.1–8.1)

## 2015-12-28 LAB — POCT URINALYSIS DIP (DEVICE)
Bilirubin Urine: NEGATIVE
Glucose, UA: NEGATIVE mg/dL
Hgb urine dipstick: NEGATIVE
Ketones, ur: NEGATIVE mg/dL
Leukocytes, UA: NEGATIVE
Nitrite: NEGATIVE
PH: 5.5 (ref 5.0–8.0)
Protein, ur: NEGATIVE mg/dL
SPECIFIC GRAVITY, URINE: 1.025 (ref 1.005–1.030)
Urobilinogen, UA: 0.2 mg/dL (ref 0.0–1.0)

## 2015-12-28 LAB — LIPID PANEL
CHOL/HDL RATIO: 4.2 ratio (ref <=5.0)
Cholesterol: 260 mg/dL — ABNORMAL HIGH (ref 125–200)
HDL: 62 mg/dL (ref >=40)
LDL CALC: 171 mg/dL — AB (ref <130)
Triglycerides: 134 mg/dL (ref <150)
VLDL: 27 mg/dL (ref <30)

## 2015-12-28 MED ORDER — AMLODIPINE BESYLATE 10 MG PO TABS: 10.0000 mg | ORAL_TABLET | Freq: Every morning | ORAL | Status: DC

## 2015-12-28 MED ORDER — ATORVASTATIN CALCIUM 40 MG PO TABS: 40.0000 mg | ORAL_TABLET | Freq: Every day | ORAL | Status: DC

## 2015-12-28 NOTE — Patient Instructions (Signed)
DASH Eating Plan  DASH stands for "Dietary Approaches to Stop Hypertension." The DASH eating plan is a healthy eating plan that has been shown to reduce high blood pressure (hypertension). Additional health benefits may include reducing the risk of type 2 diabetes mellitus, heart disease, and stroke. The DASH eating plan may also help with weight loss.  WHAT DO I NEED TO KNOW ABOUT THE DASH EATING PLAN?  For the DASH eating plan, you will follow these general guidelines:  · Choose foods with a percent daily value for sodium of less than 5% (as listed on the food label).  · Use salt-free seasonings or herbs instead of table salt or sea salt.  · Check with your health care provider or pharmacist before using salt substitutes.  · Eat lower-sodium products, often labeled as "lower sodium" or "no salt added."  · Eat fresh foods.  · Eat more vegetables, fruits, and low-fat dairy products.  · Choose whole grains. Look for the word "whole" as the first word in the ingredient list.  · Choose fish and skinless chicken or turkey more often than red meat. Limit fish, poultry, and meat to 6 oz (170 g) each day.  · Limit sweets, desserts, sugars, and sugary drinks.  · Choose heart-healthy fats.  · Limit cheese to 1 oz (28 g) per day.  · Eat more home-cooked food and less restaurant, buffet, and fast food.  · Limit fried foods.  · Cook foods using methods other than frying.  · Limit canned vegetables. If you do use them, rinse them well to decrease the sodium.  · When eating at a restaurant, ask that your food be prepared with less salt, or no salt if possible.  WHAT FOODS CAN I EAT?  Seek help from a dietitian for individual calorie needs.  Grains  Whole grain or whole wheat bread. Brown rice. Whole grain or whole wheat pasta. Quinoa, bulgur, and whole grain cereals. Low-sodium cereals. Corn or whole wheat flour tortillas. Whole grain cornbread. Whole grain crackers. Low-sodium crackers.  Vegetables  Fresh or frozen vegetables  (raw, steamed, roasted, or grilled). Low-sodium or reduced-sodium tomato and vegetable juices. Low-sodium or reduced-sodium tomato sauce and paste. Low-sodium or reduced-sodium canned vegetables.   Fruits  All fresh, canned (in natural juice), or frozen fruits.  Meat and Other Protein Products  Ground beef (85% or leaner), grass-fed beef, or beef trimmed of fat. Skinless chicken or turkey. Ground chicken or turkey. Pork trimmed of fat. All fish and seafood. Eggs. Dried beans, peas, or lentils. Unsalted nuts and seeds. Unsalted canned beans.  Dairy  Low-fat dairy products, such as skim or 1% milk, 2% or reduced-fat cheeses, low-fat ricotta or cottage cheese, or plain low-fat yogurt. Low-sodium or reduced-sodium cheeses.  Fats and Oils  Tub margarines without trans fats. Light or reduced-fat mayonnaise and salad dressings (reduced sodium). Avocado. Safflower, olive, or canola oils. Natural peanut or almond butter.  Other  Unsalted popcorn and pretzels.  The items listed above may not be a complete list of recommended foods or beverages. Contact your dietitian for more options.  WHAT FOODS ARE NOT RECOMMENDED?  Grains  White bread. White pasta. White rice. Refined cornbread. Bagels and croissants. Crackers that contain trans fat.  Vegetables  Creamed or fried vegetables. Vegetables in a cheese sauce. Regular canned vegetables. Regular canned tomato sauce and paste. Regular tomato and vegetable juices.  Fruits  Dried fruits. Canned fruit in light or heavy syrup. Fruit juice.  Meat and Other Protein   Products  Fatty cuts of meat. Ribs, chicken wings, bacon, sausage, bologna, salami, chitterlings, fatback, hot dogs, bratwurst, and packaged luncheon meats. Salted nuts and seeds. Canned beans with salt.  Dairy  Whole or 2% milk, cream, half-and-half, and cream cheese. Whole-fat or sweetened yogurt. Full-fat cheeses or blue cheese. Nondairy creamers and whipped toppings. Processed cheese, cheese spreads, or cheese  curds.  Condiments  Onion and garlic salt, seasoned salt, table salt, and sea salt. Canned and packaged gravies. Worcestershire sauce. Tartar sauce. Barbecue sauce. Teriyaki sauce. Soy sauce, including reduced sodium. Steak sauce. Fish sauce. Oyster sauce. Cocktail sauce. Horseradish. Ketchup and mustard. Meat flavorings and tenderizers. Bouillon cubes. Hot sauce. Tabasco sauce. Marinades. Taco seasonings. Relishes.  Fats and Oils  Butter, stick margarine, lard, shortening, ghee, and bacon fat. Coconut, palm kernel, or palm oils. Regular salad dressings.  Other  Pickles and olives. Salted popcorn and pretzels.  The items listed above may not be a complete list of foods and beverages to avoid. Contact your dietitian for more information.  WHERE CAN I FIND MORE INFORMATION?  National Heart, Lung, and Blood Institute: www.nhlbi.nih.gov/health/health-topics/topics/dash/     This information is not intended to replace advice given to you by your health care provider. Make sure you discuss any questions you have with your health care provider.     Document Released: 07/14/2011 Document Revised: 08/15/2014 Document Reviewed: 05/29/2013  Elsevier Interactive Patient Education ©2016 Elsevier Inc.

## 2015-12-28 NOTE — Progress Notes (Signed)
Subjective:    Patient ID: Cory Marks, male    DOB: 10-Jun-1961, 55 y.o.   MRN: [id number]  Hypertension This is a recurrent problem. The current episode started 1 to 4 weeks ago. The problem has been gradually improving since onset. The problem is controlled. Pertinent negatives include no anxiety, blurred vision, chest pain, headaches, malaise/fatigue, neck pain, palpitations, peripheral edema or shortness of breath. Risk factors for coronary artery disease include diabetes mellitus. Past treatments include ACE inhibitors and calcium channel blockers. The current treatment provides significant improvement. There are no compliance problems.  Hypertensive end-organ damage includes kidney disease. There is no history of a thyroid problem. Identifiable causes of hypertension include chronic renal disease. There is no history of sleep apnea.  Diabetes He presents for his follow-up diabetic visit. He has type 2 diabetes mellitus. No MedicAlert identification noted. His disease course has been improving. Pertinent negatives for hypoglycemia include no dizziness or headaches. Associated symptoms include foot paresthesias. Pertinent negatives for diabetes include no blurred vision, no chest pain, no fatigue, no foot ulcerations, no polydipsia, no polyphagia, no polyuria and no weakness. Symptoms are stable. Risk factors for coronary artery disease include diabetes mellitus, dyslipidemia and male sex. He participates in exercise intermittently. An ACE inhibitor/angiotensin II receptor blocker is being taken. He does not see a podiatrist.Eye exam is current.  Hyperlipidemia This is a recurrent problem. The problem is uncontrolled. Recent lipid tests were reviewed and are high. Exacerbating diseases include chronic renal disease and diabetes. He has no history of hypothyroidism, liver disease, obesity or nephrotic syndrome. There are no known factors aggravating his hyperlipidemia. Pertinent negatives include no  chest pain, myalgias or shortness of breath. Current antihyperlipidemic treatment includes statins. The current treatment provides mild improvement of lipids. There are no compliance problems.  Risk factors for coronary artery disease include dyslipidemia, hypertension and diabetes mellitus.   Past Medical History  Diagnosis Date  . Hypertension   . Diabetes mellitus   . Bell's palsy   . Cancer ([place])     Mantle Cell Cancer- S/P stem cell transplant  . History of splenectomy   . Polysubstance abuse   . Neuropathy ([place])     right leg due to football injury  . CAD (coronary artery disease)   . Sinus bradycardia   . CKD (chronic kidney disease) stage 3, GFR 30-59 ml/min   . Pre-diabetes 05/21/2014  . Hyperlipemia   . Dysrhythmia     atrial fib and atrial flutter  . Neuromuscular disorder ([place])     peripheral neuropathy   Social History   Social History  . Marital Status: Married    Spouse Name: N/A  . Number of Children: N/A  . Years of Education: N/A   Occupational History  . Not on file.   Social History Main Topics  . Smoking status: Never Smoker   . Smokeless tobacco: Never Used  . Alcohol Use: 0.6 oz/week    1 Cans of beer per week     Comment: does not drink every day.  no history of withdrawal   . Drug Use: Yes    Special: Marijuana     Comment: Cocaine, prescription drug abuse, etc pt states none in several months  . Sexual Activity:    Partners: Female   Other Topics Concern  . Not on file   Social History Narrative   Allergies  Allergen Reactions  . Oxycodone Itching and Palpitations    Immunization History  Administered Date(s) Administered  .  DTaP 05/30/2013  . Influenza,inj,Quad PF,36+ Mos 05/30/2014, 06/15/2015  . MMR 05/30/2013  . Pneumococcal Conjugate-13 12/11/2014  . Pneumococcal-Unspecified 05/30/2013  . Tdap 05/30/2013   Review of Systems  Constitutional: Negative.  Negative for malaise/fatigue and fatigue.  HENT: Negative.   Eyes:  Negative.  Negative for blurred vision.  Respiratory: Negative.  Negative for shortness of breath.   Cardiovascular: Negative.  Negative for chest pain, palpitations and leg swelling.  Gastrointestinal: Negative.  Negative for constipation.  Endocrine: Negative.  Negative for polydipsia, polyphagia and polyuria.  Genitourinary: Negative.  Negative for hematuria.  Musculoskeletal: Negative.  Negative for myalgias and neck pain.       Previous amputation to right toes.   Skin: Negative.  Negative for rash and wound.  Allergic/Immunologic: Negative.   Neurological: Negative.  Negative for dizziness, weakness and headaches.  Hematological: Negative.   Psychiatric/Behavioral: Negative.  Negative for sleep disturbance.       Objective:   Physical Exam  Constitutional: He is oriented to person, place, and time. He appears well-developed and well-nourished.  HENT:  Head: Normocephalic and atraumatic.  Right Ear: External ear normal.  Left Ear: External ear normal.  Mouth/Throat: Oropharynx is clear and moist.  Eyes: Conjunctivae and EOM are normal. Pupils are equal, round, and reactive to light.  Neck: Normal range of motion. Neck supple.  Cardiovascular: Normal rate, regular rhythm, normal heart sounds and intact distal pulses.   Pulmonary/Chest: Effort normal and breath sounds normal.  Abdominal: Soft. Bowel sounds are normal.  Musculoskeletal: Normal range of motion.  Neurological: He is alert and oriented to person, place, and time. He has normal reflexes.  Monofilament test normal  Skin: Skin is warm and dry.  Psychiatric: He has a normal mood and affect. His behavior is normal. Judgment and thought content normal.      BP 132/106 mmHg  Pulse 62  Temp(Src) 98.1 F (36.7 C) (Oral)  Resp 16  Ht 6' (1.829 m)  Wt 215 lb (97.523 kg)  BMI 29.15 kg/m2  SpO2 100%  Assessment & Plan:  1. Essential hypertension Blood pressure is above goal on current medication regimen. Will  continue medication at current dosage. Will follow up in office in 3 months.  - EKG 12-Lead - Urinalysis Dipstick - COMPLETE METABOLIC PANEL WITH GFR  2. Controlled type 2 diabetes mellitus with complication, without long-term current use of insulin ([facility]) Hemoglobin a1C has improved to 5.8. Recommend a lowfat, low carbohydrate diet divided over 5-6 small meals, increase water intake to 6-8 glasses, and 150 minutes per week of cardiovascular exercise.   - COMPLETE METABOLIC PANEL WITH GFR - Hemoglobin A1c  3. Hyperlipidemia The patient is asked to make an attempt to improve diet and exercise patterns to aid in medical management of this problem. - Lipid Panel - atorvastatin (LIPITOR) 40 MG tablet; Take 1 tablet (40 mg total) by mouth daily.  Dispense: 90 tablet; Refill: 1 The patient was given clear instructions to go to ER or return to medical center if symptoms do not improve, worsen or new problems develop. The patient verbalized understanding. Will notify patient with laboratory results.     Routine Health Maintenance: Colonoscopy 2 years ago Follow up for opthalmology exam as scheduled Follow up with nephrologist, call to make appt as discussed   Hollis,Lachina M, FNP  RTC: 3 months for chronic conditions.

## 2015-12-29 ENCOUNTER — Telehealth: Payer: Self-pay

## 2015-12-29 LAB — HEMOGLOBIN A1C
HEMOGLOBIN A1C: 5.7 % — AB (ref <5.7)
Mean Plasma Glucose: 117 mg/dL

## 2015-12-29 NOTE — Telephone Encounter (Signed)
Called no answer, left message to call back regarding labs. Thanks!

## 2015-12-29 NOTE — Telephone Encounter (Signed)
Patient returned call, I advised of improved labs and to continue diet and exercise. Advised patient to follow up with eye doctor and to keep next scheduled appointment. Patient verbalized understanding. Thanks!

## 2015-12-29 NOTE — Telephone Encounter (Signed)
-----   Message from Dorena Dew, [place] sent at 12/29/2015  2:02 PM EDT ----- Regarding: lab results Please inform Mr. Dillion that hemoglobin a1C has improved to 5.7. Please schedule follow up appt with opthalmology. I will not change any medications at this time. He is to follow up in office in 3 months.  ----- Message -----    From: Lab in [place]: 12/29/2015   2:41 AM      To: Dorena Dew, FNP

## 2016-02-29 ENCOUNTER — Ambulatory Visit: Payer: Self-pay | Admitting: Orthopedic Surgery

## 2016-03-14 ENCOUNTER — Encounter (HOSPITAL_COMMUNITY): Payer: Self-pay

## 2016-03-14 ENCOUNTER — Other Ambulatory Visit (HOSPITAL_COMMUNITY): Payer: Self-pay | Admitting: *Deleted

## 2016-03-14 NOTE — Progress Notes (Signed)
EKG 12-28-15 EPIC

## 2016-03-14 NOTE — Patient Instructions (Addendum)
Trenell Parrow  03/14/2016   Your procedure is scheduled on: 03-21-16  Report to [facility] Main  Entrance take [facility]  elevators to 3rd floor to  [place] at 625 AM.  Call this number if you have problems the morning of surgery [phone]   Remember: ONLY 1 PERSON MAY GO WITH YOU TO SHORT STAY TO GET  READY MORNING OF Cory Marks.  Do not eat food or drink liquids :After Midnight.     Take these medicines the morning of surgery with A SIP OF WATER: AMLODIPINE (NORVASC), ATORVASTATIN (LIPITOR), GABAPENTIN (NEURONTIN), VEETID             You may not have any metal on your body including hair pins and              piercings  Do not wear jewelry, make-up, lotions, powders or perfumes, deodorant             Do not wear nail polish.  Do not shave  48 hours prior to surgery.              Men may shave face and neck.   Do not bring valuables to the hospital. [place].  Contacts, dentures or bridgework may not be worn into surgery.  Leave suitcase in the car. After surgery it may be brought to your room.     Patients discharged the day of surgery will not be allowed to drive home.  Name and phone number of your driver:  Special Instructions: N/A              Please read over the following fact sheets you were given: _____________________________________________________________________             [facility] - Preparing for Surgery Before surgery, you can play an important role.  Because skin is not sterile, your skin needs to be as free of germs as possible.  You can reduce the number of germs on your skin by washing with CHG (chlorahexidine gluconate) soap before surgery.  CHG is an antiseptic cleaner which kills germs and bonds with the skin to continue killing germs even after washing. Please DO NOT use if you have an allergy to CHG or antibacterial soaps.  If your skin becomes  reddened/irritated stop using the CHG and inform your nurse when you arrive at Short Stay. Do not shave (including legs and underarms) for at least 48 hours prior to the first CHG shower.  You may shave your face/neck. Please follow these instructions carefully:  1.  Shower with CHG Soap the night before surgery and the  morning of Surgery.  2.  If you choose to wash your hair, wash your hair first as usual with your  normal  shampoo.  3.  After you shampoo, rinse your hair and body thoroughly to remove the  shampoo.                           4.  Use CHG as you would any other liquid soap.  You can apply chg directly  to the skin and wash  Gently with a scrungie or clean washcloth.  5.  Apply the CHG Soap to your body ONLY FROM THE NECK DOWN.   Do not use on face/ open                           Wound or open sores. Avoid contact with eyes, ears mouth and genitals (private parts).                       Wash face,  Genitals (private parts) with your normal soap.             6.  Wash thoroughly, paying special attention to the area where your surgery  will be performed.  7.  Thoroughly rinse your body with warm water from the neck down.  8.  DO NOT shower/wash with your normal soap after using and rinsing off  the CHG Soap.                9.  Pat yourself dry with a clean towel.            10.  Wear clean pajamas.            11.  Place clean sheets on your bed the night of your first shower and do not  sleep with pets. Day of Surgery : Do not apply any lotions/deodorants the morning of surgery.  Please wear clean clothes to the hospital/surgery center.  FAILURE TO FOLLOW THESE INSTRUCTIONS MAY RESULT IN THE CANCELLATION OF YOUR SURGERY PATIENT SIGNATURE_________________________________  NURSE SIGNATURE__________________________________  ________________________________________________________________________   Adam Phenix  An incentive spirometer is a tool that  can help keep your lungs clear and active. This tool measures how well you are filling your lungs with each breath. Taking long deep breaths may help reverse or decrease the chance of developing breathing (pulmonary) problems (especially infection) following:  A long period of time when you are unable to move or be active. BEFORE THE PROCEDURE   If the spirometer includes an indicator to show your best effort, your nurse or respiratory therapist will set it to a desired goal.  If possible, sit up straight or lean slightly forward. Try not to slouch.  Hold the incentive spirometer in an upright position. INSTRUCTIONS FOR USE  1. Sit on the edge of your bed if possible, or sit up as far as you can in bed or on a chair. 2. Hold the incentive spirometer in an upright position. 3. Breathe out normally. 4. Place the mouthpiece in your mouth and seal your lips tightly around it. 5. Breathe in slowly and as deeply as possible, raising the piston or the ball toward the top of the column. 6. Hold your breath for 3-5 seconds or for as long as possible. Allow the piston or ball to fall to the bottom of the column. 7. Remove the mouthpiece from your mouth and breathe out normally. 8. Rest for a few seconds and repeat Steps 1 through 7 at least 10 times every 1-2 hours when you are awake. Take your time and take a few normal breaths between deep breaths. 9. The spirometer may include an indicator to show your best effort. Use the indicator as a goal to work toward during each repetition. 10. After each set of 10 deep breaths, practice coughing to be sure your lungs are clear. If you have an incision (the cut made at the time of surgery),  support your incision when coughing by placing a pillow or rolled up towels firmly against it. Once you are able to get out of bed, walk around indoors and cough well. You may stop using the incentive spirometer when instructed by your caregiver.  RISKS AND  COMPLICATIONS  Take your time so you do not get dizzy or light-headed.  If you are in pain, you may need to take or ask for pain medication before doing incentive spirometry. It is harder to take a deep breath if you are having pain. AFTER USE  Rest and breathe slowly and easily.  It can be helpful to keep track of a log of your progress. Your caregiver can provide you with a simple table to help with this. If you are using the spirometer at home, follow these instructions: [place] IF:   You are having difficultly using the spirometer.  You have trouble using the spirometer as often as instructed.  Your pain medication is not giving enough relief while using the spirometer.  You develop fever of 100.5 F (38.1 C) or higher. SEEK IMMEDIATE MEDICAL CARE IF:   You cough up bloody sputum that had not been present before.  You develop fever of 102 F (38.9 C) or greater.  You develop worsening pain at or near the incision site. MAKE SURE YOU:   Understand these instructions.  Will watch your condition.  Will get help right away if you are not doing well or get worse. Document Released: 12/05/2006 Document Revised: 10/17/2011 Document Reviewed: 02/05/2007 ExitCare Patient Information 2014 ExitCare, [place].   ________________________________________________________________________  WHAT IS A BLOOD TRANSFUSION? Blood Transfusion Information  A transfusion is the replacement of blood or some of its parts. Blood is made up of multiple cells which provide different functions.  Red blood cells carry oxygen and are used for blood loss replacement.  White blood cells fight against infection.  Platelets control bleeding.  Plasma helps clot blood.  Other blood products are available for specialized needs, such as hemophilia or other clotting disorders. BEFORE THE TRANSFUSION  Who gives blood for transfusions?   Healthy volunteers who are fully evaluated to make sure  their blood is safe. This is blood bank blood. Transfusion therapy is the safest it has ever been in the practice of medicine. Before blood is taken from a donor, a complete history is taken to make sure that person has no history of diseases nor engages in risky social behavior (examples are intravenous drug use or sexual activity with multiple partners). The donor's travel history is screened to minimize risk of transmitting infections, such as malaria. The donated blood is tested for signs of infectious diseases, such as HIV and hepatitis. The blood is then tested to be sure it is compatible with you in order to minimize the chance of a transfusion reaction. If you or a relative donates blood, this is often done in anticipation of surgery and is not appropriate for emergency situations. It takes many days to process the donated blood. RISKS AND COMPLICATIONS Although transfusion therapy is very safe and saves many lives, the main dangers of transfusion include:   Getting an infectious disease.  Developing a transfusion reaction. This is an allergic reaction to something in the blood you were given. Every precaution is taken to prevent this. The decision to have a blood transfusion has been considered carefully by your caregiver before blood is given. Blood is not given unless the benefits outweigh the risks. AFTER THE TRANSFUSION  Right after receiving a blood transfusion, you will usually feel much better and more energetic. This is especially true if your red blood cells have gotten low (anemic). The transfusion raises the level of the red blood cells which carry oxygen, and this usually causes an energy increase.  The nurse administering the transfusion will monitor you carefully for complications. HOME CARE INSTRUCTIONS  No special instructions are needed after a transfusion. You may find your energy is better. Speak with your caregiver about any limitations on activity for underlying diseases  you may have. SEEK MEDICAL CARE IF:   Your condition is not improving after your transfusion.  You develop redness or irritation at the intravenous (IV) site. SEEK IMMEDIATE MEDICAL CARE IF:  Any of the following symptoms occur over the next 12 hours:  Shaking chills.  You have a temperature by mouth above 102 F (38.9 C), not controlled by medicine.  Chest, back, or muscle pain.  People around you feel you are not acting correctly or are confused.  Shortness of breath or difficulty breathing.  Dizziness and fainting.  You get a rash or develop hives.  You have a decrease in urine output.  Your urine turns a dark color or changes to pink, red, or brown. Any of the following symptoms occur over the next 10 days:  You have a temperature by mouth above 102 F (38.9 C), not controlled by medicine.  Shortness of breath.  Weakness after normal activity.  The white part of the eye turns yellow (jaundice).  You have a decrease in the amount of urine or are urinating less often.  Your urine turns a dark color or changes to pink, red, or brown. Document Released: 07/22/2000 Document Revised: 10/17/2011 Document Reviewed: 03/10/2008 [facility] Patient Information 2014 [place], [place].  _______________________________________________________________________

## 2016-03-15 ENCOUNTER — Encounter (HOSPITAL_COMMUNITY)
Admission: RE | Admit: 2016-03-15 | Discharge: 2016-03-15 | Disposition: A | Discharge status: Home / Self Care | Payer: Medicare PPO | Source: Ambulatory Visit | Attending: Orthopedic Surgery | Admitting: Orthopedic Surgery

## 2016-03-15 ENCOUNTER — Encounter (HOSPITAL_COMMUNITY): Payer: Self-pay

## 2016-03-15 DIAGNOSIS — M1712 Unilateral primary osteoarthritis, left knee: Secondary | ICD-10-CM | POA: Insufficient documentation

## 2016-03-15 DIAGNOSIS — Z0183 Encounter for blood typing: Secondary | ICD-10-CM | POA: Diagnosis not present

## 2016-03-15 DIAGNOSIS — Z01812 Encounter for preprocedural laboratory examination: Secondary | ICD-10-CM | POA: Insufficient documentation

## 2016-03-15 HISTORY — DX: Personal history of antineoplastic chemotherapy: Z92.21

## 2016-03-15 HISTORY — DX: Personal history of other endocrine, nutritional and metabolic disease: Z86.39

## 2016-03-15 LAB — APTT: aPTT: 29 seconds (ref 24–36)

## 2016-03-15 LAB — COMPREHENSIVE METABOLIC PANEL
ALK PHOS: 39 U/L (ref 38–126)
ALT: 23 U/L (ref 17–63)
ANION GAP: 9 (ref 5–15)
AST: 37 U/L (ref 15–41)
Albumin: 4.3 g/dL (ref 3.5–5.0)
BUN: 33 mg/dL — ABNORMAL HIGH (ref 6–20)
CALCIUM: 9.3 mg/dL (ref 8.9–10.3)
CO2: 24 mmol/L (ref 22–32)
Chloride: 104 mmol/L (ref 101–111)
Creatinine, Ser: 2.26 mg/dL — ABNORMAL HIGH (ref 0.61–1.24)
GFR calc non Af Amer: 31 mL/min — ABNORMAL LOW (ref >60)
GFR, EST AFRICAN AMERICAN: 36 mL/min — AB (ref >60)
Glucose, Bld: 86 mg/dL (ref 65–99)
Potassium: 3.7 mmol/L (ref 3.5–5.1)
SODIUM: 137 mmol/L (ref 135–145)
TOTAL PROTEIN: 6.5 g/dL (ref 6.5–8.1)
Total Bilirubin: 1.4 mg/dL — ABNORMAL HIGH (ref 0.3–1.2)

## 2016-03-15 LAB — URINALYSIS, ROUTINE W REFLEX MICROSCOPIC
Bilirubin Urine: NEGATIVE
GLUCOSE, UA: NEGATIVE mg/dL
HGB URINE DIPSTICK: NEGATIVE
KETONES UR: NEGATIVE mg/dL
Leukocytes, UA: NEGATIVE
Nitrite: NEGATIVE
PH: 5.5 (ref 5.0–8.0)
PROTEIN: NEGATIVE mg/dL
SPECIFIC GRAVITY, URINE: 1.022 (ref 1.005–1.030)

## 2016-03-15 LAB — CBC
HCT: 42.6 % (ref 39.0–52.0)
Hemoglobin: 14.6 g/dL (ref 13.0–17.0)
MCH: 32.4 pg (ref 26.0–34.0)
MCHC: 34.3 g/dL (ref 30.0–36.0)
MCV: 94.7 fL (ref 78.0–100.0)
Platelets: 144 10*3/uL — ABNORMAL LOW (ref 150–400)
RBC: 4.5 MIL/uL (ref 4.22–5.81)
RDW: 13.3 % (ref 11.5–15.5)
WBC: 6.8 10*3/uL (ref 4.0–10.5)

## 2016-03-15 LAB — ABO/RH: ABO/RH(D): A POS

## 2016-03-15 LAB — PROTIME-INR
INR: 1.13
PROTHROMBIN TIME: 14.6 s (ref 11.4–15.2)

## 2016-03-15 LAB — SURGICAL PCR SCREEN
MRSA, PCR: NEGATIVE
Staphylococcus aureus: NEGATIVE

## 2016-03-15 NOTE — Progress Notes (Signed)
SPOKE WITH DR Loris ABNORMAL EKG 12-28-15 AND 10-20-14 ABNORMAL EKG, PATIENT NEEDS CARDIAC CLEARANCE PRIOR TO 03-21-16 SURGERY. LEFT MESSAGE WITH [facility] PATIENT NEEDS CARDIAC CLEARANCE PRIOR TO 04-21-16 SURGERY PER DR Suzette Battiest

## 2016-03-15 NOTE — Progress Notes (Signed)
CMET RESULTS ROUTED TO DR ALUISIO BY EPIC FAX

## 2016-03-16 ENCOUNTER — Encounter: Payer: Self-pay | Admitting: Cardiology

## 2016-03-21 ENCOUNTER — Inpatient Hospital Stay (HOSPITAL_COMMUNITY): Admission: RE | Admit: 2016-03-21 | Payer: Medicare PPO | Source: Ambulatory Visit | Admitting: Orthopedic Surgery

## 2016-03-21 ENCOUNTER — Encounter (HOSPITAL_COMMUNITY): Admission: RE | Payer: Self-pay | Source: Ambulatory Visit

## 2016-03-21 LAB — TYPE AND SCREEN
ABO/RH(D): A POS
Antibody Screen: NEGATIVE

## 2016-03-21 SURGERY — ARTHROPLASTY, KNEE, TOTAL
Anesthesia: Choice | Site: Knee | Laterality: Left

## 2016-03-29 ENCOUNTER — Encounter: Payer: Self-pay | Admitting: Family Medicine

## 2016-03-29 ENCOUNTER — Ambulatory Visit (INDEPENDENT_AMBULATORY_CARE_PROVIDER_SITE_OTHER): Payer: Medicare PPO | Admitting: Family Medicine

## 2016-03-29 VITALS — BP 130/95 | HR 46 | Temp 97.6°F | Resp 16 | Ht 72.0 in | Wt 205.0 lb

## 2016-03-29 DIAGNOSIS — R7303 Prediabetes: Secondary | ICD-10-CM

## 2016-03-29 DIAGNOSIS — Z23 Encounter for immunization: Secondary | ICD-10-CM

## 2016-03-29 DIAGNOSIS — E138 Other specified diabetes mellitus with unspecified complications: Secondary | ICD-10-CM | POA: Diagnosis not present

## 2016-03-29 MED ORDER — ATORVASTATIN CALCIUM 40 MG PO TABS: 40.0000 mg | ORAL_TABLET | Freq: Every day | ORAL | 1 refills | Status: DC

## 2016-03-29 MED ORDER — LISINOPRIL 20 MG PO TABS: 20.0000 mg | ORAL_TABLET | Freq: Every day | ORAL | 3 refills | Status: DC

## 2016-03-29 MED ORDER — GABAPENTIN 300 MG PO CAPS
600.0000 mg | ORAL_CAPSULE | Freq: Two times a day (BID) | ORAL | 0 refills | Status: DC
Start: 2016-03-29 — End: 2016-11-03

## 2016-03-29 NOTE — Patient Instructions (Signed)
Return in 3 months. I am increasing your lisinopril and stopping the amlodipine.

## 2016-03-29 NOTE — Progress Notes (Signed)
Cory Marks, is a 55 y.o. male  [id number]  [id number]  DOB - Nov 27, 1960  CC:  Chief Complaint  Patient presents with  . Diabetes  . Hyperlipidemia  . Hypertension       HPI: Cory Marks is a 55 y.o. male here for follow-up hypertension. He has a history of diabetes which is in good control without medication. He has CAD, atrial fib and flutter and has recently been assessed by cardiology. He has a history of Mantle Cell Cancer, which is in remission. He has hypercholesterolemia but is not currently taking his medication. He is on gabapentin for peripheral neuropathy. He has a history of poly substance abuse but denies current use. He denies tobacco use. He reports drinking a drink or two a week. He has a opthalamology appointment coming up.   He is being assessed by cardiology for clearance for orthopedic surgery on his knee.  He accepts flu shot today.  Allergies  Allergen Reactions  . Oxycodone Itching and Palpitations   Past Medical History:  Diagnosis Date  . Bell's palsy   . CAD (coronary artery disease)   . Cancer [facility]) 2012   Mantle Cell Cancer- S/P stem cell transplant  . CKD (chronic kidney disease) stage 3, GFR 30-59 ml/min   . Dysrhythmia    atrial fib and atrial flutter  . History of chemotherapy last tx 2014  . History of diabetes mellitus NONE SINCE 2012 CANCER  . History of splenectomy   . Hyperlipemia   . Hypertension   . Neuromuscular disorder ([place])    peripheral neuropathy  . Neuropathy ([place])    right leg due to football injury  . Polysubstance abuse   . Sinus bradycardia    Current Outpatient Prescriptions on File Prior to Visit  Medication Sig Dispense Refill  . penicillin v potassium (VEETID) 500 MG tablet Take 500 mg by mouth 2 (two) times daily.    Marland Kitchen pyridOXINE (VITAMIN B-6) 100 MG tablet Take 100 mg by mouth every morning.      No current facility-administered medications on file prior to visit.    Family History  Problem  Relation Age of Onset  . Hypertension Mother   . Diabetes Mother   . Cancer Father     pancreatic cancer  . Diabetes Father   . Heart disease Father   . Diabetes Sister    Social History   Social History  . Marital status: Married    Spouse name: N/A  . Number of children: N/A  . Years of education: N/A   Occupational History  . Not on file.   Social History Main Topics  . Smoking status: Never Smoker  . Smokeless tobacco: Never Used  . Alcohol use 0.6 oz/week    1 Cans of beer per week     Comment: does not drink every day.  no history of withdrawal   . Drug use:     Types: Marijuana     Comment: hx cocaine use 3-4 yrs ago, hx prescipriotn drug abuse 2012, none since  . Sexual activity: Yes    Partners: Female   Other Topics Concern  . Not on file   Social History Narrative  . No narrative on file    Review of Systems: Constitutional: Negative for fever, chills, appetite change, weight loss,  Fatigue. Skin: Negative for rashes or lesions of concern. HENT: Negative for ear pain, ear discharge.nose bleeds Eyes: Negative for pain, discharge, redness, itching and visual disturbance. Neck: Negative  for pain, stiffness Respiratory: Negative for cough, shortness of breath,   Cardiovascular: Negative for chest pain, palpitations and leg swelling. Gastrointestinal: Negative for abdominal pain, nausea, vomiting, diarrhea, constipations Genitourinary: Negative for dysuria, urgency, frequency, hematuria,  Musculoskeletal: Negative for back pain and gait problem.Negative for weakness. Positive for knee pain and swelling R >L Neurological: Negative for dizziness, tremors, seizures, syncope,   light-headedness, numbness and headaches.  Hematological: Negative for easy bruising or bleeding Psychiatric/Behavioral: Negative for depression, anxiety, decreased concentration, confusion   Objective:   Vitals:   03/29/16 0832  BP: (!) 130/95  Pulse: (!) 46  Resp: 16  Temp:  97.6 F (36.4 C)    Physical Exam: Constitutional: Patient appears well-developed and well-nourished. No distress. HENT: Normocephalic, atraumatic, External right and left ear normal. Oropharynx is clear and moist.  Eyes: Conjunctivae and EOM are normal. PERRLA, no scleral icterus. Neck: Normal ROM. Neck supple. No lymphadenopathy, No thyromegaly. CVS: RRR, S1/S2 +, no murmurs, no gallops, no rubs Pulmonary: Effort and breath sounds normal, no stridor, rhonchi, wheezes, rales.  Abdominal: Soft. Normoactive BS,, no distension, tenderness, rebound or guarding.  Musculoskeletal: Normal range of motion. R knee is swollen and tender. Neuro: Alert.Normal muscle tone coordination. Non-focal Skin: Skin is warm and dry. No rash noted. Not diaphoretic. No erythema. No pallor. Psychiatric: Normal mood and affect. Behavior, judgment, thought content normal.  Lab Results  Component Value Date   WBC 6.8 03/15/2016   HGB 14.6 03/15/2016   HCT 42.6 03/15/2016   MCV 94.7 03/15/2016   PLT 144 (L) 03/15/2016   Lab Results  Component Value Date   CREATININE 2.26 (H) 03/15/2016   BUN 33 (H) 03/15/2016   NA 137 03/15/2016   K 3.7 03/15/2016   CL 104 03/15/2016   CO2 24 03/15/2016    Lab Results  Component Value Date   HGBA1C 5.7 (H) 12/28/2015   Lipid Panel     Component Value Date/Time   CHOL 260 (H) 12/28/2015 0843   TRIG 134 12/28/2015 0843   HDL 62 12/28/2015 0843   CHOLHDL 4.2 12/28/2015 0843   VLDL 27 12/28/2015 0843   LDLCALC 171 (H) 12/28/2015 0843       Assessment and plan:   1. Diabetes, in good control with lifestyle. - Microalbumin, urine   Return in about 3 months (around 06/29/2016) for HTN, A1C.  The patient was given clear instructions to go to ER or return to medical center if symptoms don't improve, worsen or new problems develop. The patient verbalized understanding.    Micheline Chapman FNP  03/29/2016, 9:03 AM

## 2016-03-30 LAB — MICROALBUMIN, URINE: MICROALB UR: 3.2 mg/dL

## 2016-04-01 ENCOUNTER — Encounter: Payer: Self-pay | Admitting: Cardiology

## 2016-04-01 NOTE — Consult Note (Addendum)
Cory Marks    Date of visit:  04/01/2016 DOB:  [birth year]    Age:  55 yrs. Medical record number:  [id number]     Account number:  [id number] Primary Care Provider: Arnette Norris ____________________________ CURRENT DIAGNOSES  1. Encounter for preprocedural cardiovascular examination  2. Hypertensive heart disease without heart failure  3. Atrial flutter, typical  4. Hyperlipidemia  5. 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 Obesity  7. Chronic kidney disease, stage 4 (severe)  8. Mantle Cell Lymphoma ____________________________ ALLERGIES  Oxycodone, Itching, non-specific ____________________________ MEDICATIONS  1. amlodipine 10 mg tablet, 1 p.o. daily  2. lisinopril 10 mg tablet, 1 p.o. daily  3. pyridoxine (vitamin B6) 100 mg tablet, Take as directed  4. penicillin V potassium 500 mg tablet, BID  5. gabapentin 300 mg capsule, 2 p.o. b.i.d. ____________________________ CHIEF COMPLAINTS  Followup of Atrial flutter, typical ____________________________ HISTORY OF PRESENT ILLNESS Patient seen for cardiac followup. He has a history of atrial flutter he was originally seen for preop evaluation. He was found to have worsening renal function with a creatinine around 2.5 and his surgery was canceled. He returns today for further discussion. He continues in atrial flutter with fairly slow ventricular response. His activity is limited because of significant knee pain. He denies angina and has no PND orthopnea or claudication. He is diabetic and also is obese. Previously when he was in the hospital the recommendations were for him to followup at the heart care and consider a cardioversion down the road but he really did not understand this and never took anticoagulation. ____________________________ PAST HISTORY  Past Medical Illnesses:  hypertension, DM-non-insulin dependent, hyperlipidemia, chronic kidney disease Stage 3, peripheral neuropathy, history of mantle cell  lymphoma, Bells Palsy;  Cardiovascular Illnesses:  atrial flutter;  Infectious Diseases:  no previous history of significant infectious diseases;  Surgical Procedures:  splenectomy, amputation of right first and second toe, tendon surgery;  Trauma History:  no previous history of significant trauma;  NYHA Classification:  I;  Canadian Angina Classification:  Class 0: Asymptomatic;  Cardiology Procedures-Invasive:  no previous interventional or invasive cardiology procedures;  Cardiology Procedures-Noninvasive:  echocardiogram March 2016;  Peripheral Vascular Procedures:  no previous invasive peripheral vascular procedures.;  LVEF of 45% documented via echocardiogram on 03/28/2016,   ____________________________ CARDIO-PULMONARY TEST DATES EKG Date:  03/17/2016;  Echocardiography Date: 03/28/2016;   ____________________________ FAMILY HISTORY Father -- Father dead, Carcinoma of the pancreas, Diabetes mellitus Mother -- Mother alive with problem, Diabetes mellitus, Hypertension Sister -- Sister alive with problem, Diabetes mellitus ____________________________ SOCIAL HISTORY Alcohol Use:  about 0.6 oz per week;  Smoking:  nonsmoker;  Diet:  regular diet;  Lifestyle:  1 son;  Exercise:  no regular exercise;  Occupation:  disabled;  Illicit Drug Use:  history of cocaine and mariquana abuse in past;  Residence:  lives alone;   ____________________________ REVIEW OF SYSTEMS General:  denies recent weight change, fatique or change in exercise tolerance. Respiratory: dyspnea with exertion Cardiovascular:  please review HPI  Genitourinary-Male: no dysuria, urgency, frequency, or nocturia  Musculoskeletal:  arthritis of the knees Neurological:  peripheral neuropathy Psychiatric:  denies depression or anxiety  ____________________________ PHYSICAL EXAMINATION VITAL SIGNS  Blood Pressure:  132/94 Sitting, Left arm, regular cuff  , 130/92 Standing, Left arm and large cuff   Pulse:  48/min. Weight:  209.00 lbs.  Height:  72"BMI: 28  Constitutional:  pleasant African Americian male in no acute distress Skin:  warm and dry to touch,  no apparent skin lesions, or masses noted. Head:  normocephalic, normal hair pattern, no masses or tenderness Chest:  normal symmetry, clear to auscultation., Portacath present right Cardiac:  regular rhythm, normal S1 and S2, No S3 or S4, no murmurs, gallops or rubs detected. Peripheral Pulses:  the femoral,dorsalis pedis, and posterior tibial pulses are full and equal bilaterally with no bruits auscultated. Extremities & Back:  1+edema right leg, foot drop on right, large surgical scar right knee, absent toes 1 and 2 on right Neurological:  no gross motor or sensory deficits noted, affect appropriate, oriented x3. ____________________________ MOST RECENT LIPID PANEL 12/28/15  CHOL TOTL 260 mg/dl, LDL 171 NM, HDL 62 mg/dl, TRIGLYCER 134 mg/dl, CHOL/HDL 4.2 (Calc) and VLDL 27 ____________________________ IMPRESSIONS/PLAN  1. Persistent atrial flutter with slow response in the absence of AV nodal agents 2. Hypertensive heart disease 3. History of mantle cell lymphoma with previous stem cell transplant  RECOMMENDATIONS:  At the present time I'd like to see if we can get him back into sinus rhythm prior to his knee replacement. He doesn't really want to take long-term anticoagulation if possible.  I discussed that I felt he had a couple of options here:  1. Anticoagulation for 3-4 weeks with cardioversion afterwards. 2. Referral for ablation for atrial flutter in the hopes that maybe he would not need long-term anticoagulation. He might need to have a TEE prior to that if it needed to [place] her diet otherwise he likely would need to be anticoagulated prior to that. I did explain to him that he was at risk for atrial fibrillation in the future.  After discussion of the options he would like to talk to electrophysiologist about a catheter ablation for his atrial flutter and  we will make a referral. ALso needs nephrology referral.   ____________________________ TODAYS ORDERS  1. Nephrology Consult: Schedule ASAP  2. Return Visit: 2 months  3. EP referral                       ____________________________ Cardiology Physician:  [name] MD [facility]

## 2016-04-04 ENCOUNTER — Telehealth: Payer: Self-pay | Admitting: Cardiology

## 2016-04-04 ENCOUNTER — Encounter: Payer: Self-pay | Admitting: Cardiology

## 2016-04-04 ENCOUNTER — Telehealth: Payer: Self-pay | Admitting: Nurse Practitioner

## 2016-04-04 NOTE — Telephone Encounter (Signed)
Dr Wynonia Lawman would like patient seen to discuss flutter ablation.  Dr Curt Bears has first available new patient appointment 9/12.  I have scheduled this appointment and left a message for the patient to call to confirm that he can come this day.   Chanetta Marshall, NP 04/04/2016 9:14 AM

## 2016-04-04 NOTE — Telephone Encounter (Signed)
New Message ° °Pt returning RN call. Please call back to discuss  °

## 2016-04-04 NOTE — Telephone Encounter (Signed)
See previous phone note. Pt aware of appt  Chanetta Marshall, NP 04/04/2016 10:34 AM

## 2016-04-05 NOTE — Progress Notes (Addendum)
Electrophysiology Office Note   Date:  04/06/2016   ID:  Cory Marks, DOB 1960-09-27, MRN [id number]  PCP:  Dorena Dew, FNP  Cardiologist:  Wynonia Lawman Primary Electrophysiologist:  Jaylin Benzel Meredith Leeds, MD    Chief Complaint  Patient presents with  . New Patient (Initial Visit)    discuss aflutter ablation     History of Present Illness: Farmer Kundinger is a 55 y.o. male who presents today for electrophysiology evaluation.   History of atrial flutter, hypertension, diabetes, hyperlipidemia, CKG stage III, history of mantle cell lymphoma.  He was initially supposed to have surgery on his knee, it was found to have a creatinine of 2.5 and surgery was canceled. His atrial flutter shows a slow ventricular response. He says that he has been feeling fatigued over the last few months. He says that he occasionally also has palpitations.   Today, he denies symptoms of chest pain, shortness of breath, orthopnea, PND, lower extremity edema, claudication, dizziness, presyncope, syncope, bleeding, or neurologic sequela. The patient is tolerating medications without difficulties and is otherwise without complaint today.    Past Medical History:  Diagnosis Date  . Bell's palsy   . CAD (coronary artery disease)   . Cancer [facility]) 2012   Mantle Cell Cancer- S/P stem cell transplant  . CKD (chronic kidney disease) stage 3, GFR 30-59 ml/min   . Dysrhythmia    atrial fib and atrial flutter  . History of chemotherapy last tx 2014  . History of diabetes mellitus NONE SINCE 2012 CANCER  . History of splenectomy   . Hyperlipemia   . Hypertension   . Neuromuscular disorder ([place])    peripheral neuropathy  . Neuropathy ([place])    right leg due to football injury  . Polysubstance abuse   . Sinus bradycardia    Past Surgical History:  Procedure Laterality Date  . AMPUTATION Right 10/20/2014   Procedure: AMPUTATION RIGHT GREAT TOE;  Surgeon: Gaynelle Arabian, MD;  Location: [facility];  Service: Orthopedics;   Laterality: Right;  . AMPUTATION Right 12/25/2014   Procedure: RIGHT 2ND TOE AMPUTATION ;  Surgeon: Wylene Simmer, MD;  Location: [facility];  Service: Orthopedics;  Laterality: Right;  . edentulation N/A y-2   prior to transplant  . right knee surgery Right    nerve graft back of leg  . SPLENECTOMY, TOTAL     mantle cell lymphoma  . TENOLYSIS Right 12/25/2014   Procedure: RIGHT 3,4,5 FLEXOR TENDON RELEASE ;  Surgeon: Wylene Simmer, MD;  Location: [facility];  Service: Orthopedics;  Laterality: Right;     Current Outpatient Prescriptions  Medication Sig Dispense Refill  . atorvastatin (LIPITOR) 40 MG tablet Take 1 tablet (40 mg total) by mouth daily. 90 tablet 1  . gabapentin (NEURONTIN) 300 MG capsule Take 2 capsules (600 mg total) by mouth 2 (two) times daily. 120 capsule 0  . lisinopril (PRINIVIL,ZESTRIL) 20 MG tablet Take 1 tablet (20 mg total) by mouth daily. 90 tablet 3  . penicillin v potassium (VEETID) 500 MG tablet Take 500 mg by mouth 2 (two) times daily.    Marland Kitchen pyridOXINE (VITAMIN B-6) 100 MG tablet Take 100 mg by mouth every morning.      No current facility-administered medications for this visit.     Allergies:   Oxycodone   Social History:  The patient  reports that he has never smoked. He has never used smokeless tobacco. He reports that he drinks about 0.6 oz of alcohol per  week . He reports that he uses drugs, including Marijuana.   Family History:  The patient's family history includes Cancer in his father; Diabetes in his father, mother, and sister; Heart disease in his father; Hypertension in his mother.    ROS:  Please see the history of present illness.   Otherwise, review of systems is positive for palpitations.   All other systems are reviewed and negative.    PHYSICAL EXAM: VS:  BP (!) 142/96   Pulse 60   Ht 6' (1.829 m)   Wt 207 lb 6.4 oz (94.1 kg)   BMI 28.13 kg/m  , BMI Body mass index is 28.13 kg/m. GEN: Well nourished,  well developed, in no acute distress  HEENT: normal  Neck: no JVD, carotid bruits, or masses Cardiac: RRR; no murmurs, rubs, or gallops,no edema  Respiratory:  clear to auscultation bilaterally, normal work of breathing GI: soft, nontender, nondistended, + BS MS: no deformity or atrophy  Skin: warm and dry Neuro:  Strength and sensation are intact Psych: euthymic mood, full affect  EKG:  EKG is ordered today. Personal review of the ekg ordered shows atrial flutter, rate 60  Recent Labs: 03/15/2016: ALT 23; BUN 33; Creatinine, Ser 2.26; Hemoglobin 14.6; Platelets 144; Potassium 3.7; Sodium 137    Lipid Panel     Component Value Date/Time   CHOL 260 (H) 12/28/2015 0843   TRIG 134 12/28/2015 0843   HDL 62 12/28/2015 0843   CHOLHDL 4.2 12/28/2015 0843   VLDL 27 12/28/2015 0843   LDLCALC 171 (H) 12/28/2015 0843     Wt Readings from Last 3 Encounters:  04/06/16 207 lb 6.4 oz (94.1 kg)  03/29/16 205 lb (93 kg)  03/15/16 200 lb 12.8 oz (91.1 kg)      Other studies Reviewed: Additional studies/ records that were reviewed today include: TTE 2016  Review of the above records today demonstrates:  - Left ventricle: The cavity size was normal. There was mild concentric hypertrophy. Systolic function was mildly to moderately reduced. The estimated ejection fraction was in the range of 40% to 45%. Mild diffuse hypokinesis. - Aortic valve: There was mild regurgitation. - Mitral valve: There was mild regurgitation. - Left atrium: The atrium was mildly dilated. - Right ventricle: The cavity size was dilated. Wall thickness was normal. - Right atrium: The atrium was mildly dilated.   ASSESSMENT AND PLAN:  1.  Atrial flutter: currently, he does not have a high stroke risk, but he does say that he is prediabetic so it is possibly higher than his 0.6%. He says that when he was diagnosed and treated for his lymphoma that he lost weight and became prediabetic and said of diabetic.  That being said, he does warrant anticoagulation as he has agreed to ablation. Risks and benefits were discussed. Risks include bleeding, tamponade, heart block, and stroke. We Lovell Roe start him on coumadin today and he Livie Vanderhoof  need 3 weeks of therapeutic INRs prior to ablation.he does have slow ventricular response and I talked to him about the possibility of him needing a pacemaker.  This patients CHA2DS2-VASc Score and unadjusted Ischemic Stroke Rate (% per year) is equal to 0.6 % stroke rate/year from a score of 1  Above score calculated as 1 point each if present [CHF, HTN, DM, Vascular=MI/PAD/Aortic Plaque, Age if 65-74, or Male] Above score calculated as 2 points each if present [Age > 75, or Stroke/TIA/TE]  2. Hypertension: currently well-controlled  Current medicines are reviewed at length with  the patient today.   The patient does not have concerns regarding his medicines.  The following changes were made today:  coumadin  Labs/ tests ordered today include:  No orders of the defined types were placed in this encounter.    Disposition:   FU with Jamin Panther 2 months  Signed, Khara Renaud Meredith Leeds, MD  04/06/2016 3:12 PM     [facility] the emergency room [address] [place] [address] [phone] (office) [phone] (fax)

## 2016-04-06 ENCOUNTER — Encounter: Payer: Self-pay | Admitting: Cardiology

## 2016-04-06 ENCOUNTER — Encounter: Payer: Self-pay | Admitting: *Deleted

## 2016-04-06 ENCOUNTER — Other Ambulatory Visit: Payer: Self-pay | Admitting: Cardiology

## 2016-04-06 ENCOUNTER — Ambulatory Visit (INDEPENDENT_AMBULATORY_CARE_PROVIDER_SITE_OTHER): Payer: Medicare PPO | Admitting: Cardiology

## 2016-04-06 VITALS — BP 142/96 | HR 60 | Ht 72.0 in | Wt 207.4 lb

## 2016-04-06 DIAGNOSIS — Z01812 Encounter for preprocedural laboratory examination: Secondary | ICD-10-CM

## 2016-04-06 DIAGNOSIS — I483 Typical atrial flutter: Secondary | ICD-10-CM | POA: Diagnosis not present

## 2016-04-06 MED ORDER — WARFARIN SODIUM 5 MG PO TABS: 5.0000 mg | ORAL_TABLET | Freq: Every day | ORAL | 0 refills | Status: DC

## 2016-04-06 NOTE — Addendum Note (Signed)
Addended by: Stanton Kidney on: 04/06/2016 03:31 PM   Modules accepted: Orders

## 2016-04-06 NOTE — Patient Instructions (Addendum)
Medication Instructions:  Your physician has recommended you make the following change in your medication:  1) START Coumadin 5 mg daily   Labwork: Your physician recommends that you return for pre proceudre lab work between September 11-20th.   Testing/Procedures: Your physician has recommended that you have an ablation. Catheter ablation is a medical procedure used to treat some cardiac arrhythmias (irregular heartbeats). During catheter ablation, a long, thin, flexible tube is put into a blood vessel in your groin (upper thigh), or neck. This tube is called an ablation catheter. It is then guided to your heart through the blood vessel. Radio frequency waves destroy small areas of heart tissue where abnormal heartbeats may cause an arrhythmia to start. Please see the instruction sheet given to you today.  Follow-Up: Your physician recommends that you schedule an appointment on 9/5 @ 9:30 a.m. to establish with the Coumadin clinic.  Your physician recommends that you schedule a follow-up appointment in: 4 weeks, after your ablation on 04/28/16, with Dr. Curt Bears.  If you need a refill on your cardiac medications before your next appointment, please call your pharmacy.  Thank you for choosing [facility]!!   Trinidad Curet, RN [phone]  Any Other Special Instructions Will Be Listed Below (If Applicable).  Cardiac Ablation Cardiac ablation is a procedure to disable a small amount of heart tissue in very specific places. The heart has many electrical connections. Sometimes these connections are abnormal and can cause the heart to beat very fast or irregularly. By disabling some of the problem areas, heart rhythm can be improved or made normal. Ablation is done for people who:   Have Wolff-Parkinson-White syndrome.   Have other fast heart rhythms (tachycardia).   Have taken medicines for an abnormal heart rhythm (arrhythmia) that resulted in:   No success.   Side effects.    May have a high-risk heartbeat that could result in death.  LET [facility] CARE PROVIDER KNOW ABOUT:   Any allergies you have or any previous reactions you have had to X-ray dye, food (such as seafood), medicine, or tape.   All medicines you are taking, including vitamins, herbs, eye drops, creams, and over-the-counter medicines.   Previous problems you or members of your family have had with the use of anesthetics.   Any blood disorders you have.   Previous surgeries or procedures (such as a kidney transplant) you have had.   Medical conditions you have (such as kidney failure).  RISKS AND COMPLICATIONS Generally, cardiac ablation is a safe procedure. However, problems can occur and include:   Increased risk of cancer. Depending on how long it takes to do the ablation, the dose of radiation can be high.  Bruising and bleeding where a thin, flexible tube (catheter) was inserted during the procedure.   Bleeding into the chest, especially into the sac that surrounds the heart (serious).  Need for a permanent pacemaker if the normal electrical system is damaged.   The procedure may not be fully effective, and this may not be recognized for months. Repeat ablation procedures are sometimes required. BEFORE THE PROCEDURE   Follow any instructions from your health care provider regarding eating and drinking before the procedure.   Take your medicines as directed at regular times with water, unless instructed otherwise by your health care provider. If you are taking diabetes medicine, including insulin, ask how you are to take it and if there are any special instructions you should follow. It is common to adjust insulin dosing  the day of the ablation.  PROCEDURE  An ablation is usually performed in a catheterization laboratory with the guidance of fluoroscopy. Fluoroscopy is a type of X-ray that helps your health care provider see images of your heart during the procedure.    An ablation is a minimally invasive procedure. This means a small cut (incision) is made in either your neck or groin. Your health care provider will decide where to make the incision based on your medical history and physical exam.  An IV tube will be started before the procedure begins. You will be given an anesthetic or medicine to help you relax (sedative).  The skin on your neck or groin will be numbed. A needle will be inserted into a large vein in your neck or groin and catheters will be threaded to your heart.  A special dye that shows up on fluoroscopy pictures may be injected through the catheter. The dye helps your health care provider see the area of the heart that needs treatment.  The catheter has electrodes on the tip. When the area of heart tissue that is causing the arrhythmia is found, the catheter tip will send an electrical current to the area and "scar" the tissue. Three types of energy can be used to ablate the heart tissue:   Heat (radiofrequency energy).   Laser energy.   Extreme cold (cryoablation).   When the area of the heart has been ablated, the catheter will be taken out. Pressure will be held on the insertion site. This will help the insertion site clot and keep it from bleeding. A bandage will be placed on the insertion site.  AFTER THE PROCEDURE   After the procedure, you will be taken to a recovery area where your vital signs (blood pressure, heart rate, and breathing) will be monitored. The insertion site will also be monitored for bleeding.   You will need to lie still for 4-6 hours. This is to ensure you do not bleed from the catheter insertion site.    This information is not intended to replace advice given to you by your health care provider. Make sure you discuss any questions you have with your health care provider.   Document Released: 12/11/2008 Document Revised: 08/15/2014 Document Reviewed: 12/17/2012 Elsevier Interactive Patient  Education [employer].

## 2016-04-06 NOTE — Addendum Note (Signed)
Addended by: Stanton Kidney on: 04/06/2016 04:11 PM   Modules accepted: Orders

## 2016-04-07 NOTE — Addendum Note (Signed)
Addended by: Stanton Kidney on: 04/07/2016 01:09 PM   Modules accepted: Orders

## 2016-04-13 ENCOUNTER — Other Ambulatory Visit: Payer: Self-pay | Admitting: Family Medicine

## 2016-04-18 ENCOUNTER — Encounter (INDEPENDENT_AMBULATORY_CARE_PROVIDER_SITE_OTHER): Payer: Self-pay

## 2016-04-18 ENCOUNTER — Ambulatory Visit (INDEPENDENT_AMBULATORY_CARE_PROVIDER_SITE_OTHER): Payer: Medicare PPO | Admitting: Pharmacist

## 2016-04-18 ENCOUNTER — Other Ambulatory Visit: Payer: Medicare PPO | Admitting: *Deleted

## 2016-04-18 ENCOUNTER — Other Ambulatory Visit: Payer: Self-pay | Admitting: Cardiology

## 2016-04-18 DIAGNOSIS — I4892 Unspecified atrial flutter: Secondary | ICD-10-CM

## 2016-04-18 DIAGNOSIS — Z5181 Encounter for therapeutic drug level monitoring: Secondary | ICD-10-CM | POA: Diagnosis not present

## 2016-04-18 LAB — POCT INR: INR: 1.5

## 2016-04-18 MED ORDER — WARFARIN SODIUM 5 MG PO TABS: 5.0000 mg | ORAL_TABLET | Freq: Every day | ORAL | 0 refills | Status: DC

## 2016-04-19 ENCOUNTER — Ambulatory Visit: Payer: Medicare PPO | Admitting: Cardiology

## 2016-04-20 ENCOUNTER — Other Ambulatory Visit: Payer: Self-pay | Admitting: Family Medicine

## 2016-04-21 ENCOUNTER — Telehealth: Payer: Self-pay

## 2016-04-22 NOTE — Telephone Encounter (Signed)
Spoke to patient, fax to [facility]  Was sent in on Wednesday 04/20/2016 and clarified that Lisinopril should be 20mg . Thanks! Patient advised that this was fixed. Thanks!

## 2016-04-25 ENCOUNTER — Ambulatory Visit (INDEPENDENT_AMBULATORY_CARE_PROVIDER_SITE_OTHER): Payer: Medicare PPO | Admitting: Pharmacist

## 2016-04-25 DIAGNOSIS — I4892 Unspecified atrial flutter: Secondary | ICD-10-CM

## 2016-04-25 DIAGNOSIS — Z5181 Encounter for therapeutic drug level monitoring: Secondary | ICD-10-CM

## 2016-04-25 LAB — POCT INR: INR: 2.2

## 2016-04-28 ENCOUNTER — Encounter (HOSPITAL_COMMUNITY): Payer: Self-pay

## 2016-04-28 ENCOUNTER — Ambulatory Visit (HOSPITAL_COMMUNITY): Admit: 2016-04-28 | Payer: Medicare PPO | Admitting: Cardiology

## 2016-04-28 SURGERY — A-FLUTTER/A-TACH/SVT ABLATION
Anesthesia: LOCAL

## 2016-05-02 ENCOUNTER — Ambulatory Visit (INDEPENDENT_AMBULATORY_CARE_PROVIDER_SITE_OTHER): Payer: Medicare PPO | Admitting: *Deleted

## 2016-05-02 DIAGNOSIS — I4892 Unspecified atrial flutter: Secondary | ICD-10-CM | POA: Diagnosis not present

## 2016-05-02 DIAGNOSIS — Z5181 Encounter for therapeutic drug level monitoring: Secondary | ICD-10-CM

## 2016-05-02 LAB — POCT INR: INR: 3.8

## 2016-05-09 ENCOUNTER — Ambulatory Visit (INDEPENDENT_AMBULATORY_CARE_PROVIDER_SITE_OTHER): Payer: Medicare PPO | Admitting: *Deleted

## 2016-05-09 DIAGNOSIS — Z5181 Encounter for therapeutic drug level monitoring: Secondary | ICD-10-CM | POA: Diagnosis not present

## 2016-05-09 DIAGNOSIS — I4892 Unspecified atrial flutter: Secondary | ICD-10-CM

## 2016-05-09 LAB — POCT INR: INR: 2.4

## 2016-05-09 MED ORDER — WARFARIN SODIUM 5 MG PO TABS: 5.0000 mg | ORAL_TABLET | Freq: Every day | ORAL | 1 refills | Status: DC

## 2016-05-11 ENCOUNTER — Other Ambulatory Visit: Payer: Self-pay | Admitting: Family Medicine

## 2016-05-13 ENCOUNTER — Telehealth: Payer: Self-pay | Admitting: *Deleted

## 2016-05-13 NOTE — Telephone Encounter (Signed)
Called patient to reschedule AFLutter ablation now that he has had at least 3 weeks of therapeutic INRs.  Patient would like to wait to schedule.  He would like to discuss at [place] on 10/20.  Explained that we would be more than glad to see him then to discuss. He thanks me for calling.

## 2016-05-16 ENCOUNTER — Ambulatory Visit (INDEPENDENT_AMBULATORY_CARE_PROVIDER_SITE_OTHER): Payer: Medicare PPO | Admitting: *Deleted

## 2016-05-16 DIAGNOSIS — I4892 Unspecified atrial flutter: Secondary | ICD-10-CM | POA: Diagnosis not present

## 2016-05-16 DIAGNOSIS — Z5181 Encounter for therapeutic drug level monitoring: Secondary | ICD-10-CM | POA: Diagnosis not present

## 2016-05-16 LAB — POCT INR: INR: 1.2

## 2016-05-18 ENCOUNTER — Encounter: Payer: Self-pay | Admitting: Cardiology

## 2016-05-23 ENCOUNTER — Ambulatory Visit (INDEPENDENT_AMBULATORY_CARE_PROVIDER_SITE_OTHER): Payer: Medicare PPO

## 2016-05-23 DIAGNOSIS — Z5181 Encounter for therapeutic drug level monitoring: Secondary | ICD-10-CM

## 2016-05-23 DIAGNOSIS — I4892 Unspecified atrial flutter: Secondary | ICD-10-CM | POA: Diagnosis not present

## 2016-05-23 LAB — POCT INR: INR: 1.2

## 2016-05-27 ENCOUNTER — Ambulatory Visit: Payer: Medicare PPO | Admitting: Cardiology

## 2016-05-31 NOTE — Progress Notes (Signed)
Oh [place]    Electrophysiology Office Note   Date:  06/01/2016   ID:  Cory Marks, DOB 1961/03/22, MRN [id number]  PCP:  Dorena Dew, FNP  Cardiologist:  Wynonia Lawman Primary Electrophysiologist:  Denice Cardon Meredith Leeds, MD    Chief Complaint  Patient presents with  . Follow-up    AFlutter     History of Present Illness: Cory Marks is a 55 y.o. male who presents today for electrophysiology evaluation.   History of atrial flutter, hypertension, diabetes, hyperlipidemia, CKG stage III, history of mantle cell lymphoma.  He was initially supposed to have surgery on his knee, it was found to have a creatinine of 2.5 and surgery was canceled. His atrial flutter shows a slow ventricular response.   Unfortunately, he missed a few doses of his Coumadin, and  became subtherapeutic. He is currently working with Coumadin clinic. He does have some dizziness when bending down and standing up.   Today, he denies symptoms of chest pain, shortness of breath, orthopnea, PND, lower extremity edema, claudication, dizziness, presyncope, syncope, bleeding, or neurologic sequela. The patient is tolerating medications without difficulties and is otherwise without complaint today.    Past Medical History:  Diagnosis Date  . Bell's palsy   . CAD (coronary artery disease)   . Cancer [facility]) 2012   Mantle Cell Cancer- S/P stem cell transplant  . CKD (chronic kidney disease) stage 3, GFR 30-59 ml/min   . Dysrhythmia    atrial fib and atrial flutter  . History of chemotherapy last tx 2014  . History of diabetes mellitus NONE SINCE 2012 CANCER  . History of splenectomy   . Hyperlipemia   . Hypertension   . Neuromuscular disorder ([place])    peripheral neuropathy  . Neuropathy ([place])    right leg due to football injury  . Polysubstance abuse   . Sinus bradycardia    Past Surgical History:  Procedure Laterality Date  . AMPUTATION Right 10/20/2014   Procedure: AMPUTATION RIGHT GREAT TOE;  Surgeon: Gaynelle Arabian, MD;  Location: [facility];  Service: Orthopedics;  Laterality: Right;  . AMPUTATION Right 12/25/2014   Procedure: RIGHT 2ND TOE AMPUTATION ;  Surgeon: Wylene Simmer, MD;  Location: [facility];  Service: Orthopedics;  Laterality: Right;  . edentulation N/A y-2   prior to transplant  . right knee surgery Right    nerve graft back of leg  . SPLENECTOMY, TOTAL     mantle cell lymphoma  . TENOLYSIS Right 12/25/2014   Procedure: RIGHT 3,4,5 FLEXOR TENDON RELEASE ;  Surgeon: Wylene Simmer, MD;  Location: [facility];  Service: Orthopedics;  Laterality: Right;     Current Outpatient Prescriptions  Medication Sig Dispense Refill  . amLODipine (NORVASC) 10 MG tablet TAKE 1 TABLET EVERY MORNING 90 tablet 1  . atorvastatin (LIPITOR) 40 MG tablet Take 1 tablet (40 mg total) by mouth daily. 90 tablet 1  . gabapentin (NEURONTIN) 300 MG capsule Take 2 capsules (600 mg total) by mouth 2 (two) times daily. 120 capsule 0  . lisinopril (PRINIVIL,ZESTRIL) 20 MG tablet Take 1 tablet (20 mg total) by mouth daily. 90 tablet 3  . penicillin v potassium (VEETID) 500 MG tablet Take 500 mg by mouth 2 (two) times daily.    Marland Kitchen pyridOXINE (VITAMIN B-6) 100 MG tablet Take 100 mg by mouth every morning.     . warfarin (COUMADIN) 5 MG tablet Take 1 tablet (5 mg total) by mouth daily. 40 tablet 1   No  current facility-administered medications for this visit.     Allergies:   Oxycodone   Social History:  The patient  reports that he has never smoked. He has never used smokeless tobacco. He reports that he drinks about 0.6 oz of alcohol per week . He reports that he uses drugs, including Marijuana.   Family History:  The patient's family history includes Cancer in his father; Diabetes in his father, mother, and sister; Heart disease in his father; Hypertension in his mother.    ROS:  Please see the history of present illness.   Otherwise, review of systems is positive for dizziness.   All  other systems are reviewed and negative.    PHYSICAL EXAM: VS:  BP 118/90   Pulse 79   Ht 6' (1.829 m)   Wt 203 lb (92.1 kg)   BMI 27.53 kg/m  , BMI Body mass index is 27.53 kg/m. GEN: Well nourished, well developed, in no acute distress  HEENT: normal  Neck: no JVD, carotid bruits, or masses Cardiac: RRR; no murmurs, rubs, or gallops,no edema  Respiratory:  clear to auscultation bilaterally, normal work of breathing GI: soft, nontender, nondistended, + BS MS: no deformity or atrophy  Skin: warm and dry Neuro:  Strength and sensation are intact Psych: euthymic mood, full affect  EKG:  EKG is ordered today. Personal review of the ekg ordered 8/30 shows atrial flutter, rate 60  Recent Labs: 03/15/2016: ALT 23; BUN 33; Creatinine, Ser 2.26; Hemoglobin 14.6; Platelets 144; Potassium 3.7; Sodium 137    Lipid Panel     Component Value Date/Time   CHOL 260 (H) 12/28/2015 0843   TRIG 134 12/28/2015 0843   HDL 62 12/28/2015 0843   CHOLHDL 4.2 12/28/2015 0843   VLDL 27 12/28/2015 0843   LDLCALC 171 (H) 12/28/2015 0843     Wt Readings from Last 3 Encounters:  06/01/16 203 lb (92.1 kg)  04/06/16 207 lb 6.4 oz (94.1 kg)  03/29/16 205 lb (93 kg)      Other studies Reviewed: Additional studies/ records that were reviewed today include: TTE 2016  Review of the above records today demonstrates:  - Left ventricle: The cavity size was normal. There was mild concentric hypertrophy. Systolic function was mildly to moderately reduced. The estimated ejection fraction was in the range of 40% to 45%. Mild diffuse hypokinesis. - Aortic valve: There was mild regurgitation. - Mitral valve: There was mild regurgitation. - Left atrium: The atrium was mildly dilated. - Right ventricle: The cavity size was dilated. Wall thickness was normal. - Right atrium: The atrium was mildly dilated.   ASSESSMENT AND PLAN:  1.  Atrial flutter: currently, he does not have a high stroke risk,  but he does say that he is prediabetic so it is possibly higher than his 0.6%. He is unfortunately subtherapeutic on his Coumadin, and therefore is working with Coumadin clinic for adjustments. We'll wait for 3 weeks of therapeutic INRs prior to scheduling him for ablation.  This patients CHA2DS2-VASc Score and unadjusted Ischemic Stroke Rate (% per year) is equal to 0.6 % stroke rate/year from a score of 1  Above score calculated as 1 point each if present [CHF, HTN, DM, Vascular=MI/PAD/Aortic Plaque, Age if 65-74, or Male] Above score calculated as 2 points each if present [Age > 75, or Stroke/TIA/TE]  2. Hypertension: currently well-controlled  Current medicines are reviewed at length with the patient today.   The patient does not have concerns regarding his medicines.  The  following changes were made today:  coumadin  Labs/ tests ordered today include:  No orders of the defined types were placed in this encounter.    Disposition:   FU with [place]way Paone 3 months  Signed, Khristen Cheyney Meredith Leeds, MD  06/01/2016 9:36 AM     [facility] the emergency room [address] [place] [address] [phone] (office) [phone] (fax)

## 2016-06-01 ENCOUNTER — Encounter (INDEPENDENT_AMBULATORY_CARE_PROVIDER_SITE_OTHER): Payer: Self-pay

## 2016-06-01 ENCOUNTER — Ambulatory Visit (INDEPENDENT_AMBULATORY_CARE_PROVIDER_SITE_OTHER): Payer: Medicare PPO | Admitting: *Deleted

## 2016-06-01 ENCOUNTER — Ambulatory Visit (INDEPENDENT_AMBULATORY_CARE_PROVIDER_SITE_OTHER): Payer: Medicare PPO | Admitting: Cardiology

## 2016-06-01 ENCOUNTER — Encounter: Payer: Self-pay | Admitting: Cardiology

## 2016-06-01 VITALS — BP 118/90 | HR 79 | Ht 72.0 in | Wt 203.0 lb

## 2016-06-01 DIAGNOSIS — Z5181 Encounter for therapeutic drug level monitoring: Secondary | ICD-10-CM | POA: Diagnosis not present

## 2016-06-01 DIAGNOSIS — I483 Typical atrial flutter: Secondary | ICD-10-CM | POA: Diagnosis not present

## 2016-06-01 DIAGNOSIS — I4892 Unspecified atrial flutter: Secondary | ICD-10-CM | POA: Diagnosis not present

## 2016-06-01 LAB — POCT INR: INR: 1.6

## 2016-06-09 ENCOUNTER — Ambulatory Visit (INDEPENDENT_AMBULATORY_CARE_PROVIDER_SITE_OTHER): Payer: Medicare PPO | Admitting: *Deleted

## 2016-06-09 DIAGNOSIS — I4892 Unspecified atrial flutter: Secondary | ICD-10-CM

## 2016-06-09 DIAGNOSIS — Z5181 Encounter for therapeutic drug level monitoring: Secondary | ICD-10-CM | POA: Diagnosis not present

## 2016-06-09 LAB — POCT INR: INR: 2.3

## 2016-06-09 MED ORDER — WARFARIN SODIUM 5 MG PO TABS: ORAL_TABLET | ORAL | 2 refills | Status: DC

## 2016-06-17 ENCOUNTER — Ambulatory Visit (INDEPENDENT_AMBULATORY_CARE_PROVIDER_SITE_OTHER): Payer: Medicare PPO | Admitting: *Deleted

## 2016-06-17 DIAGNOSIS — Z5181 Encounter for therapeutic drug level monitoring: Secondary | ICD-10-CM

## 2016-06-17 DIAGNOSIS — I4892 Unspecified atrial flutter: Secondary | ICD-10-CM | POA: Diagnosis not present

## 2016-06-17 LAB — POCT INR: INR: 1.8

## 2016-06-24 ENCOUNTER — Ambulatory Visit (INDEPENDENT_AMBULATORY_CARE_PROVIDER_SITE_OTHER): Payer: Medicare PPO | Admitting: *Deleted

## 2016-06-24 DIAGNOSIS — Z5181 Encounter for therapeutic drug level monitoring: Secondary | ICD-10-CM

## 2016-06-24 DIAGNOSIS — I4892 Unspecified atrial flutter: Secondary | ICD-10-CM | POA: Diagnosis not present

## 2016-06-24 LAB — POCT INR: INR: 2.1

## 2016-06-27 ENCOUNTER — Ambulatory Visit (INDEPENDENT_AMBULATORY_CARE_PROVIDER_SITE_OTHER): Payer: Medicare PPO | Admitting: Pharmacist

## 2016-06-27 DIAGNOSIS — I4892 Unspecified atrial flutter: Secondary | ICD-10-CM

## 2016-06-27 DIAGNOSIS — Z5181 Encounter for therapeutic drug level monitoring: Secondary | ICD-10-CM

## 2016-06-27 LAB — POCT INR: INR: 1.9

## 2016-06-29 ENCOUNTER — Ambulatory Visit: Payer: Medicare PPO | Admitting: Family Medicine

## 2016-07-07 ENCOUNTER — Ambulatory Visit (INDEPENDENT_AMBULATORY_CARE_PROVIDER_SITE_OTHER): Payer: Medicare PPO | Admitting: *Deleted

## 2016-07-07 DIAGNOSIS — I4892 Unspecified atrial flutter: Secondary | ICD-10-CM

## 2016-07-07 DIAGNOSIS — Z5181 Encounter for therapeutic drug level monitoring: Secondary | ICD-10-CM

## 2016-07-07 LAB — POCT INR: INR: 4.6

## 2016-07-15 ENCOUNTER — Ambulatory Visit (INDEPENDENT_AMBULATORY_CARE_PROVIDER_SITE_OTHER): Payer: Medicare PPO | Admitting: Pharmacist

## 2016-07-15 DIAGNOSIS — I4892 Unspecified atrial flutter: Secondary | ICD-10-CM

## 2016-07-15 DIAGNOSIS — Z5181 Encounter for therapeutic drug level monitoring: Secondary | ICD-10-CM | POA: Diagnosis not present

## 2016-07-15 LAB — POCT INR: INR: 2

## 2016-08-10 ENCOUNTER — Ambulatory Visit (INDEPENDENT_AMBULATORY_CARE_PROVIDER_SITE_OTHER): Payer: Medicare PPO | Admitting: *Deleted

## 2016-08-10 DIAGNOSIS — I4892 Unspecified atrial flutter: Secondary | ICD-10-CM | POA: Diagnosis not present

## 2016-08-10 DIAGNOSIS — Z5181 Encounter for therapeutic drug level monitoring: Secondary | ICD-10-CM

## 2016-08-10 LAB — POCT INR: INR: 1.7

## 2016-08-19 ENCOUNTER — Ambulatory Visit (INDEPENDENT_AMBULATORY_CARE_PROVIDER_SITE_OTHER): Payer: Medicare PPO | Admitting: Pharmacist

## 2016-08-19 DIAGNOSIS — I4892 Unspecified atrial flutter: Secondary | ICD-10-CM | POA: Diagnosis not present

## 2016-08-19 DIAGNOSIS — Z5181 Encounter for therapeutic drug level monitoring: Secondary | ICD-10-CM | POA: Diagnosis not present

## 2016-08-19 LAB — POCT INR: INR: 2.8

## 2016-08-19 MED ORDER — WARFARIN SODIUM 5 MG PO TABS: ORAL_TABLET | ORAL | 2 refills | Status: DC

## 2016-08-22 ENCOUNTER — Ambulatory Visit (INDEPENDENT_AMBULATORY_CARE_PROVIDER_SITE_OTHER): Payer: Medicare PPO | Admitting: Family Medicine

## 2016-08-22 ENCOUNTER — Encounter: Payer: Self-pay | Admitting: Family Medicine

## 2016-08-22 VITALS — BP 144/90 | HR 76 | Temp 97.8°F | Resp 12 | Ht 72.0 in | Wt 206.0 lb

## 2016-08-22 DIAGNOSIS — I10 Essential (primary) hypertension: Secondary | ICD-10-CM | POA: Diagnosis not present

## 2016-08-22 DIAGNOSIS — Z125 Encounter for screening for malignant neoplasm of prostate: Secondary | ICD-10-CM | POA: Diagnosis not present

## 2016-08-22 DIAGNOSIS — R7303 Prediabetes: Secondary | ICD-10-CM

## 2016-08-22 DIAGNOSIS — G609 Hereditary and idiopathic neuropathy, unspecified: Secondary | ICD-10-CM

## 2016-08-22 LAB — POCT GLYCOSYLATED HEMOGLOBIN (HGB A1C): Hemoglobin A1C: 5.4

## 2016-08-22 MED ORDER — HYDROCHLOROTHIAZIDE 25 MG PO TABS: 25.0000 mg | ORAL_TABLET | Freq: Every day | ORAL | 1 refills | Status: DC

## 2016-08-22 NOTE — Patient Instructions (Signed)
Have prescribe new medication to get hypertension under better control. Visit in 3 months to recheck .  Watch salt and sweets in diet. Try to exercise a little each day.

## 2016-08-22 NOTE — Progress Notes (Signed)
Cory Marks, is a 56 y.o. male  [id number]  [id number]  DOB - 13-Nov-1960  CC:  Chief Complaint  Patient presents with  . follow up    nneds forms filled out for ortho shoes, no complaints other than chronic knee pain        HPI: Cory Marks is a 56 y.o. male here for routine follow-up. He has a history of hypertension, atrial flutter prediabetes, peripheral neuropathy and lymphoma in remission. His only verbalized complaint is of chronic knee pain, foot pain and cough. He is seeing a orthopedist. He is having an upper GI procedure due to the chronic cough. That is later this month.He is followed by cardiology. His medications are amlodipine 10 lisinipril 20 neurontin 300 bid. He is on warfarin for the A. Flutter. He also has a history of CKD. He does not exercise regularly and does not follow a strict diabetic diet. He does not smoke and has abut 2 beers a week. No drug [place].   Health Maintenance: influenza, Tdap and pneumococcal up to date. He has had colon cancer screening. Will do PSA today.     Allergies  Allergen Reactions  . Oxycodone Itching and Palpitations   Past Medical History:  Diagnosis Date  . Bell's palsy   . CAD (coronary artery disease)   . Cancer [facility]) 2012   Mantle Cell Cancer- S/P stem cell transplant  . CKD (chronic kidney disease) stage 3, GFR 30-59 ml/min   . Dysrhythmia    atrial fib and atrial flutter  . History of chemotherapy last tx 2014  . History of diabetes mellitus NONE SINCE 2012 CANCER  . History of splenectomy   . Hyperlipemia   . Hypertension   . Neuromuscular disorder ([place])    peripheral neuropathy  . Neuropathy ([place])    right leg due to football injury  . Polysubstance abuse   . Sinus bradycardia    Current Outpatient Prescriptions on File Prior to Visit  Medication Sig Dispense Refill  . amLODipine (NORVASC) 10 MG tablet TAKE 1 TABLET EVERY MORNING 90 tablet 1  . gabapentin (NEURONTIN) 300 MG capsule Take 2 capsules (600  mg total) by mouth 2 (two) times daily. 120 capsule 0  . lisinopril (PRINIVIL,ZESTRIL) 20 MG tablet Take 1 tablet (20 mg total) by mouth daily. 90 tablet 3  . penicillin v potassium (VEETID) 500 MG tablet Take 500 mg by mouth 2 (two) times daily.    Marland Kitchen pyridOXINE (VITAMIN B-6) 100 MG tablet Take 100 mg by mouth every morning.     . warfarin (COUMADIN) 5 MG tablet Take as directed by coumadin clinic 50 tablet 2  . atorvastatin (LIPITOR) 40 MG tablet Take 1 tablet (40 mg total) by mouth daily. (Patient not taking: Reported on 08/22/2016) 90 tablet 1   No current facility-administered medications on file prior to visit.    Family History  Problem Relation Age of Onset  . Hypertension Mother   . Diabetes Mother   . Cancer Father     pancreatic cancer  . Diabetes Father   . Heart disease Father   . Diabetes Sister    Social History   Social History  . Marital status: Married    Spouse name: N/A  . Number of children: N/A  . Years of education: N/A   Occupational History  . Not on file.   Social History Main Topics  . Smoking status: Never Smoker  . Smokeless tobacco: Never Used  . Alcohol use 0.6  oz/week    1 Cans of beer per week     Comment: does not drink every day.  no history of withdrawal   . Drug use:     Types: Marijuana     Comment: hx cocaine use 3-4 yrs ago, hx prescipriotn drug abuse 2012, none since  . Sexual activity: Yes    Partners: Female   Other Topics Concern  . Not on file   Social History Narrative  . No narrative on file    Review of Systems: Constitutional: Negative Skin: Negative HENT: Negative  Eyes: Negative  Neck: Negative Respiratory: + for cough Cardiovascular: Negative Gastrointestinal: Negative Genitourinary: Negative  Musculoskeletal: + for low back, knee and foot pain Neurological: Negative for Hematological: Negative  Psychiatric/Behavioral: Negative    Objective:   Vitals:   08/22/16 0842 08/22/16 0909  BP: (!) 150/109  (!) 144/90  Pulse: 76   Resp: 12   Temp: 97.8 F (36.6 C)     Physical Exam: Constitutional: Patient appears well-developed and well-nourished. No distress. HENT: Normocephalic, atraumatic, External right and left ear normal. Oropharynx is clear and moist.  Eyes: Conjunctivae and EOM are normal. PERRLA, no scleral icterus. Neck: Normal ROM. Neck supple. No lymphadenopathy, No thyromegaly. CVS: RRR, S1/S2 +, no murmurs, no gallops, no rubs Pulmonary: Effort and breath sounds normal, no stridor, rhonchi, wheezes, rales.  Abdominal: Soft. Normoactive BS,, no distension, tenderness, rebound or guarding.  Musculoskeletal: Normal range of motion. No edema and no tenderness.  Neuro: Alert.Normal muscle tone coordination. Non-focal Skin: Skin is warm and dry. No rash noted. Not diaphoretic. No erythema. No pallor. Psychiatric: Normal mood and affect. Behavior, judgment, thought content normal.  Lab Results  Component Value Date   WBC 6.8 03/15/2016   HGB 14.6 03/15/2016   HCT 42.6 03/15/2016   MCV 94.7 03/15/2016   PLT 144 (L) 03/15/2016   Lab Results  Component Value Date   CREATININE 2.26 (H) 03/15/2016   BUN 33 (H) 03/15/2016   NA 137 03/15/2016   K 3.7 03/15/2016   CL 104 03/15/2016   CO2 24 03/15/2016    Lab Results  Component Value Date   HGBA1C 5.4 08/22/2016   Lipid Panel     Component Value Date/Time   CHOL 260 (H) 12/28/2015 0843   TRIG 134 12/28/2015 0843   HDL 62 12/28/2015 0843   CHOLHDL 4.2 12/28/2015 0843   VLDL 27 12/28/2015 0843   LDLCALC 171 (H) 12/28/2015 0843        Assessment and plan:   1. Essential hypertension  - TSH  2. Prediabetes  - POCT glycosylated hemoglobin (Hb A1C) - COMPLETE METABOLIC PANEL WITH GFR - CBC with Differential - Lipid panel  3. Special screening, prostate cancer  - PSA, Medicare  4. Hereditary and idiopathic peripheral neuropathy  - DME Other see comment   Return in about 3 months (around 11/20/2016) for  HTN.  The patient was given clear instructions to go to ER or return to medical center if symptoms don't improve, worsen or new problems develop. The patient verbalized understanding.    Micheline Chapman FNP  08/22/2016, 9:43 AM

## 2016-08-23 ENCOUNTER — Telehealth: Payer: Self-pay

## 2016-08-23 ENCOUNTER — Other Ambulatory Visit: Payer: Self-pay

## 2016-08-23 LAB — CBC WITH DIFFERENTIAL/PLATELET
BASOS PCT: 1 %
Basophils Absolute: 59 cells/uL (ref 0–200)
Eosinophils Absolute: 177 cells/uL (ref 15–500)
Eosinophils Relative: 3 %
HEMATOCRIT: 44.7 % (ref 38.5–50.0)
HEMOGLOBIN: 14.8 g/dL (ref 13.2–17.1)
Lymphocytes Relative: 42 %
Lymphs Abs: 2478 cells/uL (ref 850–3900)
MCH: 32.5 pg (ref 27.0–33.0)
MCHC: 33.1 g/dL (ref 32.0–36.0)
MCV: 98.2 fL (ref 80.0–100.0)
MONO ABS: 472 {cells}/uL (ref 200–950)
MPV: 9.2 fL (ref 7.5–12.5)
Monocytes Relative: 8 %
Neutro Abs: 2714 cells/uL (ref 1500–7800)
Neutrophils Relative %: 46 %
Platelets: 227 10*3/uL (ref 140–400)
RBC: 4.55 MIL/uL (ref 4.20–5.80)
RDW: 14.4 % (ref 11.0–15.0)
WBC: 5.9 10*3/uL (ref 3.8–10.8)

## 2016-08-23 LAB — COMPLETE METABOLIC PANEL WITH GFR
ALT: 18 U/L (ref 9–46)
AST: 21 U/L (ref 10–35)
Albumin: 4.1 g/dL (ref 3.6–5.1)
Alkaline Phosphatase: 36 U/L — ABNORMAL LOW (ref 40–115)
BUN: 23 mg/dL (ref 7–25)
CHLORIDE: 103 mmol/L (ref 98–110)
CO2: 23 mmol/L (ref 20–31)
Calcium: 9.7 mg/dL (ref 8.6–10.3)
Creat: 1.51 mg/dL — ABNORMAL HIGH (ref 0.70–1.33)
GFR, EST AFRICAN AMERICAN: 59 mL/min — AB (ref >=60)
GFR, EST NON AFRICAN AMERICAN: 51 mL/min — AB (ref >=60)
Glucose, Bld: 89 mg/dL (ref 65–99)
Potassium: 4.2 mmol/L (ref 3.5–5.3)
Sodium: 137 mmol/L (ref 135–146)
Total Bilirubin: 0.6 mg/dL (ref 0.2–1.2)
Total Protein: 6 g/dL — ABNORMAL LOW (ref 6.1–8.1)

## 2016-08-23 LAB — LIPID PANEL
Cholesterol: 283 mg/dL — ABNORMAL HIGH (ref <200)
HDL: 73 mg/dL (ref >40)
LDL Cholesterol: 180 mg/dL — ABNORMAL HIGH (ref <100)
TRIGLYCERIDES: 150 mg/dL — AB (ref <150)
Total CHOL/HDL Ratio: 3.9 Ratio (ref <5.0)
VLDL: 30 mg/dL (ref <30)

## 2016-08-23 LAB — TSH: TSH: 2.92 mIU/L (ref 0.40–4.50)

## 2016-08-23 MED ORDER — ATORVASTATIN CALCIUM 40 MG PO TABS: 40.0000 mg | ORAL_TABLET | Freq: Every day | ORAL | 1 refills | Status: DC

## 2016-08-23 NOTE — Telephone Encounter (Signed)
Called, no answer. Left message asking patient to call back. Thanks!

## 2016-08-23 NOTE — Telephone Encounter (Signed)
-----   Message from Micheline Chapman, NP sent at 08/23/2016 12:43 PM EST ----- tsh ok. Creat 1.51 down from 2.26 last visit.GFR 59. Up from 37. Cholesterol 282 total, ldl 180. Find our if there is a reason he is not taking a statin.

## 2016-08-23 NOTE — Telephone Encounter (Signed)
Atorvastatin sent into pharmacy. Thanks!

## 2016-08-23 NOTE — Telephone Encounter (Signed)
Cory Marks I spoke with patient, he said that he was not taking atorvastatin because of [employer] not paying for a 30 day supply they needed [place] to send in rx for 90 days. I told him this would be easily resolved and sent in new rx for 90 days. Thanks!

## 2016-08-26 ENCOUNTER — Ambulatory Visit (INDEPENDENT_AMBULATORY_CARE_PROVIDER_SITE_OTHER): Payer: Medicare PPO | Admitting: *Deleted

## 2016-08-26 DIAGNOSIS — Z5181 Encounter for therapeutic drug level monitoring: Secondary | ICD-10-CM | POA: Diagnosis not present

## 2016-08-26 DIAGNOSIS — I4892 Unspecified atrial flutter: Secondary | ICD-10-CM | POA: Diagnosis not present

## 2016-08-26 LAB — POCT INR: INR: 2.4

## 2016-09-02 ENCOUNTER — Ambulatory Visit (INDEPENDENT_AMBULATORY_CARE_PROVIDER_SITE_OTHER): Payer: Medicare PPO | Admitting: *Deleted

## 2016-09-02 DIAGNOSIS — I4892 Unspecified atrial flutter: Secondary | ICD-10-CM

## 2016-09-02 DIAGNOSIS — Z5181 Encounter for therapeutic drug level monitoring: Secondary | ICD-10-CM

## 2016-09-02 LAB — POCT INR: INR: 2.7

## 2016-09-09 ENCOUNTER — Encounter: Payer: Self-pay | Admitting: *Deleted

## 2016-09-09 ENCOUNTER — Encounter: Payer: Self-pay | Admitting: Cardiology

## 2016-09-09 ENCOUNTER — Ambulatory Visit (INDEPENDENT_AMBULATORY_CARE_PROVIDER_SITE_OTHER): Payer: Medicare PPO

## 2016-09-09 ENCOUNTER — Other Ambulatory Visit: Payer: Self-pay | Admitting: Cardiology

## 2016-09-09 ENCOUNTER — Ambulatory Visit (INDEPENDENT_AMBULATORY_CARE_PROVIDER_SITE_OTHER): Payer: Medicare PPO | Admitting: Cardiology

## 2016-09-09 VITALS — BP 126/98 | HR 48 | Ht 72.0 in | Wt 208.4 lb

## 2016-09-09 DIAGNOSIS — Z5181 Encounter for therapeutic drug level monitoring: Secondary | ICD-10-CM | POA: Diagnosis not present

## 2016-09-09 DIAGNOSIS — Z01812 Encounter for preprocedural laboratory examination: Secondary | ICD-10-CM

## 2016-09-09 DIAGNOSIS — I4892 Unspecified atrial flutter: Secondary | ICD-10-CM

## 2016-09-09 DIAGNOSIS — I10 Essential (primary) hypertension: Secondary | ICD-10-CM

## 2016-09-09 DIAGNOSIS — I483 Typical atrial flutter: Secondary | ICD-10-CM | POA: Diagnosis not present

## 2016-09-09 LAB — POCT INR: INR: 2.4

## 2016-09-09 NOTE — Progress Notes (Signed)
Electrophysiology Office Note   Date:  09/09/2016   ID:  Cory Marks, DOB 04-08-1961, MRN [id number]  PCP:  Dorena Dew, FNP  Cardiologist:  Wynonia Lawman Primary Electrophysiologist:  Dawnelle Warman Meredith Leeds, MD    Chief Complaint  Patient presents with  . Follow-up    Aflutter     History of Present Illness: Cory Marks is a 56 y.o. male who presents today for electrophysiology evaluation.   History of atrial flutter, hypertension, diabetes, hyperlipidemia, CKG stage III, history of mantle cell lymphoma.  He was initially supposed to have surgery on his knee, it was found to have a creatinine of 2.5 and surgery was canceled. His atrial flutter shows a slow ventricular response. Today, he returns in sinus rhythm. He says that he is feeling well without any major complaints other than a cough.   Today, he denies symptoms of chest pain, shortness of breath, orthopnea, PND, lower extremity edema, claudication, dizziness, presyncope, syncope, bleeding, or neurologic sequela. The patient is tolerating medications without difficulties and is otherwise without complaint today.    Past Medical History:  Diagnosis Date  . Bell's palsy   . CAD (coronary artery disease)   . Cancer [facility]) 2012   Mantle Cell Cancer- S/P stem cell transplant  . CKD (chronic kidney disease) stage 3, GFR 30-59 ml/min   . Dysrhythmia    atrial fib and atrial flutter  . History of chemotherapy last tx 2014  . History of diabetes mellitus NONE SINCE 2012 CANCER  . History of splenectomy   . Hyperlipemia   . Hypertension   . Neuromuscular disorder ([place])    peripheral neuropathy  . Neuropathy ([place])    right leg due to football injury  . Polysubstance abuse   . Sinus bradycardia    Past Surgical History:  Procedure Laterality Date  . AMPUTATION Right 10/20/2014   Procedure: AMPUTATION RIGHT GREAT TOE;  Surgeon: Gaynelle Arabian, MD;  Location: [facility];  Service: Orthopedics;  Laterality: Right;  . AMPUTATION  Right 12/25/2014   Procedure: RIGHT 2ND TOE AMPUTATION ;  Surgeon: Wylene Simmer, MD;  Location: [place];  Service: Orthopedics;  Laterality: Right;  . edentulation N/A y-2   prior to transplant  . right knee surgery Right    nerve graft back of leg  . SPLENECTOMY, TOTAL     mantle cell lymphoma  . TENOLYSIS Right 12/25/2014   Procedure: RIGHT 3,4,5 FLEXOR TENDON RELEASE ;  Surgeon: Wylene Simmer, MD;  Location: [place];  Service: Orthopedics;  Laterality: Right;     Current Outpatient Prescriptions  Medication Sig Dispense Refill  . amLODipine (NORVASC) 10 MG tablet TAKE 1 TABLET EVERY MORNING 90 tablet 1  . atorvastatin (LIPITOR) 40 MG tablet Take 1 tablet (40 mg total) by mouth daily. 90 tablet 1  . gabapentin (NEURONTIN) 300 MG capsule Take 2 capsules (600 mg total) by mouth 2 (two) times daily. 120 capsule 0  . lisinopril (PRINIVIL,ZESTRIL) 20 MG tablet Take 1 tablet (20 mg total) by mouth daily. 90 tablet 3  . pyridOXINE (VITAMIN B-6) 100 MG tablet Take 100 mg by mouth every morning.     . warfarin (COUMADIN) 5 MG tablet Take as directed by coumadin clinic 50 tablet 2   No current facility-administered medications for this visit.     Allergies:   Oxycodone   Social History:  The patient  reports that he has never smoked. He has never used smokeless tobacco. He reports that he drinks  about 0.6 oz of alcohol per week . He reports that he uses drugs, including Marijuana.   Family History:  The patient's family history includes Cancer in his father; Diabetes in his father, mother, and sister; Heart disease in his father; Hypertension in his mother.    ROS:  Please see the history of present illness.   Otherwise, review of systems is positive for palpitations, cough.   All other systems are reviewed and negative.    PHYSICAL EXAM: VS:  BP (!) 126/98   Pulse (!) 48   Ht 6' (1.829 m)   Wt 208 lb 6.4 oz (94.5 kg)   BMI 28.26 kg/m  , BMI Body mass  index is 28.26 kg/m. GEN: Well nourished, well developed, in no acute distress  HEENT: normal  Neck: no JVD, carotid bruits, or masses Cardiac: bradycardic, regular; no murmurs, rubs, or gallops,no edema  Respiratory:  clear to auscultation bilaterally, normal work of breathing GI: soft, nontender, nondistended, + BS MS: no deformity or atrophy  Skin: warm and dry Neuro:  Strength and sensation are intact Psych: euthymic mood, full affect  EKG:  EKG is ordered today. Personal review of the ekg ordered shows sinus bradycardia, rate 48  Recent Labs: 08/22/2016: ALT 18; BUN 23; Creat 1.51; Hemoglobin 14.8; Platelets 227; Potassium 4.2; Sodium 137; TSH 2.92    Lipid Panel     Component Value Date/Time   CHOL 283 (H) 08/22/2016 0911   TRIG 150 (H) 08/22/2016 0911   HDL 73 08/22/2016 0911   CHOLHDL 3.9 08/22/2016 0911   VLDL 30 08/22/2016 0911   LDLCALC 180 (H) 08/22/2016 0911     Wt Readings from Last 3 Encounters:  09/09/16 208 lb 6.4 oz (94.5 kg)  08/22/16 206 lb (93.4 kg)  06/01/16 203 lb (92.1 kg)      Other studies Reviewed: Additional studies/ records that were reviewed today include: TTE 2016  Review of the above records today demonstrates:  - Left ventricle: The cavity size was normal. There was mild concentric hypertrophy. Systolic function was mildly to moderately reduced. The estimated ejection fraction was in the range of 40% to 45%. Mild diffuse hypokinesis. - Aortic valve: There was mild regurgitation. - Mitral valve: There was mild regurgitation. - Left atrium: The atrium was mildly dilated. - Right ventricle: The cavity size was dilated. Wall thickness was normal. - Right atrium: The atrium was mildly dilated.   ASSESSMENT AND PLAN:  1.  Atrial flutter: currently, he does not have a high stroke risk, but he does say that he is prediabetic so it is possibly higher than his 0.6%. His INR is currently therapeutic and he is in sinus rhythm today.  We did discuss the option of ablation versus continued medical management. At this time he would per her ablation. Risks and benefits were discussed. Risks include bleeding, tamponade, heart block, stroke, among others. He understands these risks and has agreed to the procedure.  This patients CHA2DS2-VASc Score and unadjusted Ischemic Stroke Rate (% per year) is equal to 0.6 % stroke rate/year from a score of 1  Above score calculated as 1 point each if present [CHF, HTN, DM, Vascular=MI/PAD/Aortic Plaque, Age if 65-74, or Male] Above score calculated as 2 points each if present [Age > 75, or Stroke/TIA/TE]  2. Hypertension: currently well-controlled  Current medicines are reviewed at length with the patient today.   The patient does not have concerns regarding his medicines.  The following changes were made today:  coumadin  Labs/ tests ordered today include:  Orders Placed This Encounter  Procedures  . EKG 12-Lead     Disposition:   FU with Keoni Havey 3 months  Signed, Doyel Mulkern Meredith Leeds, MD  09/09/2016 9:40 AM     [facility] the emergency room [address] [place] [address] [phone] (office) [phone] (fax)

## 2016-09-09 NOTE — Addendum Note (Signed)
Addended by: Stanton Kidney on: 09/09/2016 09:56 AM   Modules accepted: Orders

## 2016-09-09 NOTE — Patient Instructions (Addendum)
Medication Instructions:    Your physician recommends that you continue on your current medications as directed. Please refer to the Current Medication list given to you today.  --- If you need a refill on your cardiac medications before your next appointment, please call your pharmacy. ---  Labwork:  Pre procedure labs today: BMET, CBC w/ diff & INR  Testing/Procedures: Your physician has recommended that you have an ablation. Catheter ablation is a medical procedure used to treat some cardiac arrhythmias (irregular heartbeats). During catheter ablation, a long, thin, flexible tube is put into a blood vessel in your groin (upper thigh), or neck. This tube is called an ablation catheter. It is then guided to your heart through the blood vessel. Radio frequency waves destroy small areas of heart tissue where abnormal heartbeats may cause an arrhythmia to start. Please see the instruction sheet given to you today.  Follow-Up:  Your physician recommends that you schedule a follow-up appointment in: 4 weeks, after your procedure on 09/12/2016, with Dr. Curt Bears   Thank you for choosing [facility]!!     Any Other Special Instructions Will Be Listed Below (If Applicable).   Cardiac Ablation Cardiac ablation is a procedure to stop some heart tissue from causing problems. The heart has many electrical connections. Sometimes these connections cause the heart to beat very fast or irregularly. Removing some of the problem areas can improve heart rhythm or make it normal. Ablation is done for people who:  Have Wolff-Parkinson-White syndrome.  Have other fast heart rhythms (tachycardia).  Have taken medicines for an abnormal heart rhythm (arrhythmia) and the medicines had:  No success.  Side effects.  May have a type of heartbeat that could cause death. What happens before the procedure?  Follow instructions from your doctor about eating and drinking before the procedure.  Take your  medicines as told by your doctor. Take them at regular times with water unless told differently by your doctor.  If you are taking diabetes medicine, ask your doctor how to take it. Ask if there are any special instructions you should follow. Your doctor may change how much insulin you take the day of the procedure. What happens during the procedure?  A special type of X-ray will be used. The X-ray helps your doctor see images of your heart during the procedure.  A small cut (incision) will be made in your neck or groin.  An IV tube will be started before the procedure begins.  You will be given a numbing medicine (anesthetic) or a medicine to help you relax (sedative).  The skin on your neck or groin will be numbed.  A needle will be put into a large vein in your neck or groin.  A thin, flexible tube (catheter) will be put in to reach your heart.  A dye will be put in the tube. The dye will show up on X-rays. It will help your doctor see the area of the heart that needs treatment.  When the heart tissue that is causing problems is found, the tip of the tube will send an electrical current to it. This will stop it from causing problems.  The tube will be taken out.  Pressure will be put on the area where the tube was. This will keep it from bleeding. A bandage will be placed over the area. What happens after the procedure?  You will be taken to a recovery area. Your blood pressure, heart rate, and breathing will be watched. The area  where the tube was will also be watched for bleeding.  You will need to lie still for 4-6 hours. This keeps the area where the tube was from bleeding. This information is not intended to replace advice given to you by your health care provider. Make sure you discuss any questions you have with your health care provider. Document Released: 03/27/2013 Document Revised: 12/31/2015 Document Reviewed: 12/20/2012 Elsevier Interactive Patient Education  2017  [employer].

## 2016-09-10 LAB — CBC WITH DIFFERENTIAL/PLATELET
Basophils Absolute: 0 10*3/uL (ref 0.0–0.2)
Basos: 1 %
EOS (ABSOLUTE): 0.1 10*3/uL (ref 0.0–0.4)
Eos: 2 %
Hematocrit: 42.1 % (ref 37.5–51.0)
Hemoglobin: 14.7 g/dL (ref 13.0–17.7)
Immature Grans (Abs): 0 10*3/uL (ref 0.0–0.1)
Immature Granulocytes: 0 %
Lymphocytes Absolute: 2.4 10*3/uL (ref 0.7–3.1)
Lymphs: 40 %
MCH: 32.5 pg (ref 26.6–33.0)
MCHC: 34.9 g/dL (ref 31.5–35.7)
MCV: 93 fL (ref 79–97)
MONOS ABS: 0.4 10*3/uL (ref 0.1–0.9)
Monocytes: 7 %
NEUTROS PCT: 50 %
Neutrophils Absolute: 3 10*3/uL (ref 1.4–7.0)
PLATELETS: 226 10*3/uL (ref 150–379)
RBC: 4.52 x10E6/uL (ref 4.14–5.80)
RDW: 13.9 % (ref 12.3–15.4)
WBC: 6 10*3/uL (ref 3.4–10.8)

## 2016-09-10 LAB — BASIC METABOLIC PANEL
BUN/Creatinine Ratio: 16 (ref 9–20)
BUN: 26 mg/dL — ABNORMAL HIGH (ref 6–24)
CO2: 23 mmol/L (ref 18–29)
CREATININE: 1.66 mg/dL — AB (ref 0.76–1.27)
Calcium: 10.2 mg/dL (ref 8.7–10.2)
Chloride: 102 mmol/L (ref 96–106)
GFR calc Af Amer: 52 mL/min/{1.73_m2} — ABNORMAL LOW (ref >59)
GFR calc non Af Amer: 45 mL/min/{1.73_m2} — ABNORMAL LOW (ref >59)
GLUCOSE: 75 mg/dL (ref 65–99)
POTASSIUM: 3.9 mmol/L (ref 3.5–5.2)
Sodium: 143 mmol/L (ref 134–144)

## 2016-09-10 LAB — PROTIME-INR
INR: 2.2 — ABNORMAL HIGH (ref 0.8–1.2)
Prothrombin Time: 22.4 s — ABNORMAL HIGH (ref 9.1–12.0)

## 2016-09-12 ENCOUNTER — Encounter (HOSPITAL_COMMUNITY)
Admission: RE | Disposition: A | Discharge status: Home / Self Care | Payer: Self-pay | Source: Ambulatory Visit | Attending: Cardiology

## 2016-09-12 ENCOUNTER — Ambulatory Visit (HOSPITAL_COMMUNITY)
Admission: RE | Admit: 2016-09-12 | Discharge: 2016-09-12 | Disposition: A | Discharge status: Home / Self Care | Payer: Medicare PPO | Source: Ambulatory Visit | Attending: Cardiology | Admitting: Cardiology

## 2016-09-12 DIAGNOSIS — I10 Essential (primary) hypertension: Secondary | ICD-10-CM | POA: Insufficient documentation

## 2016-09-12 DIAGNOSIS — I483 Typical atrial flutter: Secondary | ICD-10-CM | POA: Diagnosis present

## 2016-09-12 DIAGNOSIS — I4892 Unspecified atrial flutter: Secondary | ICD-10-CM | POA: Insufficient documentation

## 2016-09-12 HISTORY — PX: A-FLUTTER ABLATION: EP1230

## 2016-09-12 LAB — PROTIME-INR
INR: 3
PROTHROMBIN TIME: 31.8 s — AB (ref 11.4–15.2)

## 2016-09-12 LAB — GLUCOSE, CAPILLARY
Glucose-Capillary: 81 mg/dL (ref 65–99)
Glucose-Capillary: 85 mg/dL (ref 65–99)

## 2016-09-12 SURGERY — A-FLUTTER ABLATION
Anesthesia: LOCAL

## 2016-09-12 MED ORDER — FENTANYL CITRATE (PF) 100 MCG/2ML IJ SOLN
INTRAMUSCULAR | Status: AC
  Filled 2016-09-12: qty 2

## 2016-09-12 MED ORDER — HYDRALAZINE HCL 20 MG/ML IJ SOLN
10.0000 mg | Freq: Once | INTRAMUSCULAR | Status: AC
  Administered 2016-09-12: 10 mg via INTRAVENOUS

## 2016-09-12 MED ORDER — SODIUM CHLORIDE 0.9 % IV SOLN
INTRAVENOUS | Status: DC | PRN
  Administered 2016-09-12: 50 mL/h via INTRAVENOUS

## 2016-09-12 MED ORDER — SODIUM CHLORIDE 0.9% FLUSH: 3.0000 mL | Freq: Two times a day (BID) | INTRAVENOUS | Status: DC

## 2016-09-12 MED ORDER — HEPARIN (PORCINE) IN NACL 2-0.9 UNIT/ML-% IJ SOLN
INTRAMUSCULAR | Status: AC
  Filled 2016-09-12: qty 500

## 2016-09-12 MED ORDER — BUPIVACAINE HCL (PF) 0.25 % IJ SOLN
INTRAMUSCULAR | Status: DC | PRN
  Administered 2016-09-12: 45 mL

## 2016-09-12 MED ORDER — SODIUM CHLORIDE 0.9 % IV SOLN: 250.0000 mL | INTRAVENOUS | Status: DC | PRN

## 2016-09-12 MED ORDER — HYDRALAZINE HCL 20 MG/ML IJ SOLN
INTRAMUSCULAR | Status: AC
  Administered 2016-09-12: 10 mg via INTRAVENOUS
  Filled 2016-09-12: qty 1

## 2016-09-12 MED ORDER — SODIUM CHLORIDE 0.9% FLUSH: 3.0000 mL | INTRAVENOUS | Status: DC | PRN

## 2016-09-12 MED ORDER — ONDANSETRON HCL 4 MG/2ML IJ SOLN: 4.0000 mg | Freq: Four times a day (QID) | INTRAMUSCULAR | Status: DC | PRN

## 2016-09-12 MED ORDER — HEPARIN (PORCINE) IN NACL 2-0.9 UNIT/ML-% IJ SOLN
INTRAMUSCULAR | Status: DC | PRN
  Administered 2016-09-12: 500 mL

## 2016-09-12 MED ORDER — ACETAMINOPHEN 325 MG PO TABS
650.0000 mg | ORAL_TABLET | ORAL | Status: DC | PRN
  Filled 2016-09-12: qty 2

## 2016-09-12 MED ORDER — MIDAZOLAM HCL 5 MG/5ML IJ SOLN
INTRAMUSCULAR | Status: AC
  Filled 2016-09-12: qty 5

## 2016-09-12 MED ORDER — BUPIVACAINE HCL (PF) 0.25 % IJ SOLN
INTRAMUSCULAR | Status: AC
  Filled 2016-09-12: qty 60

## 2016-09-12 MED ORDER — FENTANYL CITRATE (PF) 100 MCG/2ML IJ SOLN
INTRAMUSCULAR | Status: DC | PRN
  Administered 2016-09-12 (×2): 25 ug via INTRAVENOUS

## 2016-09-12 MED ORDER — MIDAZOLAM HCL 5 MG/5ML IJ SOLN
INTRAMUSCULAR | Status: DC | PRN
  Administered 2016-09-12 (×3): 1 mg via INTRAVENOUS

## 2016-09-12 SURGICAL SUPPLY — 14 items
BAG SNAP BAND KOVER 36X36 (MISCELLANEOUS) ×2 IMPLANT
BLANKET WARM UNDERBOD FULL ACC (MISCELLANEOUS) ×2 IMPLANT
CATH DUODECA/ISMUS 7FR REPROC (CATHETERS) ×2 IMPLANT
CATH EZ STEER NAV 8MM D-F CUR (ABLATOR) ×2 IMPLANT
CATH JOSEPHSON QUAD-ALLRED 6FR (CATHETERS) ×2 IMPLANT
CATH WEBSTER BI DIR CS D-F CRV (CATHETERS) ×2 IMPLANT
PACK EP LATEX FREE (CUSTOM PROCEDURE TRAY) ×1
PACK EP LF (CUSTOM PROCEDURE TRAY) ×1 IMPLANT
PAD DEFIB LIFELINK (PAD) ×2 IMPLANT
PATCH CARTO3 (PAD) ×2 IMPLANT
SHEATH PINNACLE 6F 10CM (SHEATH) ×2 IMPLANT
SHEATH PINNACLE 7F 10CM (SHEATH) ×4 IMPLANT
SHEATH PINNACLE 8F 10CM (SHEATH) ×2 IMPLANT
SHIELD RADPAD SCOOP 12X17 (MISCELLANEOUS) ×2 IMPLANT

## 2016-09-12 NOTE — H&P (Signed)
Cory Marks is a 56 y.o. male with a history of typical atrial flutter.  He presents today for EP study and ablation. On exam, regular rhythm, no murmurs, lungs clear. Risks and benefits explained. Risks include but not limited to bleeding, tamponade, heart block, and stroke. All questions were discussed. The patient understands the risks of the procedure and has agreed to ablation.  Suraiya Dickerson Curt Bears, MD 09/12/2016 7:10 AM

## 2016-09-12 NOTE — Progress Notes (Signed)
Called Dr. Elliot Cousin and informed him that central monitoring had called to inform that pt's cardiac rhythm was junctional at times as well as sinus brady and occasionally dropped into the 30s. Also informed him that after IV Hydralazine, blood pressure had decreased to 110s/80s. No new orders received.

## 2016-09-12 NOTE — Discharge Instructions (Signed)
No driving for 1 week. No lifting over 5 lbs for 1 week. No vigorous or  sexual activity for 1 week. You may return to work on  09/19/16. Keep procedure site clean & dry. If you notice increased pain, swelling, bleeding or pus, call/return!  You may shower, but no soaking baths/hot tubs/pools for 1 week.   Continue your current warfarin regime uninterrupted and without changes. You have a coumadin clinic appointment on Friday

## 2016-09-12 NOTE — Progress Notes (Signed)
Patient was seen and examined by Dr. Curt Bears, cleared to discharge once bed rest is completed Groin is soft, non-tender, no bleeding HTN noted, the patient was instructed by Dr. Curt Bears to continue his home medicines, monitor his BP at home and notify him if it remains elevated (DBP >110 regularly) Activity restrictions were discussed with the patient (and his mother at bedside) Follow up has been arranged  Tommye Standard, PA-C

## 2016-09-12 NOTE — Progress Notes (Signed)
Site area:  Rt fem venous sheaths x3 Site Prior to Removal:  Level 0 Pressure Applied For: 20 min Manual:   A/O Patient Status During Pull:  A/O Post Pull Site:  Level 0 Post Pull Instructions Given:  Post instructions given and pt understands Post Pull Pulses Present: 2+ rt dp/pt Dressing Applied:  4x4 and  Tegaderm Bedrest begins @ 10:40:00 Comments: Rt groin unremarkable. Rt groin dressing is CDI.

## 2016-09-13 ENCOUNTER — Encounter (HOSPITAL_COMMUNITY): Payer: Self-pay | Admitting: Cardiology

## 2016-09-16 ENCOUNTER — Ambulatory Visit (INDEPENDENT_AMBULATORY_CARE_PROVIDER_SITE_OTHER): Payer: Medicare PPO | Admitting: Pharmacist

## 2016-09-16 DIAGNOSIS — I4892 Unspecified atrial flutter: Secondary | ICD-10-CM

## 2016-09-16 DIAGNOSIS — Z5181 Encounter for therapeutic drug level monitoring: Secondary | ICD-10-CM | POA: Diagnosis not present

## 2016-09-16 LAB — POCT INR: INR: 2.2

## 2016-09-19 ENCOUNTER — Telehealth: Payer: Self-pay | Admitting: *Deleted

## 2016-09-19 NOTE — Telephone Encounter (Signed)
Called [facility] refilled 08/19/16, #50 tablets with 2 refills.  Asked them to refill rx, called pt made aware pharmacy is refilling rx.

## 2016-09-23 ENCOUNTER — Other Ambulatory Visit: Payer: Self-pay | Admitting: Family Medicine

## 2016-10-06 ENCOUNTER — Encounter: Payer: Self-pay | Admitting: Cardiology

## 2016-10-08 ENCOUNTER — Encounter: Payer: Self-pay | Admitting: Cardiology

## 2016-10-12 ENCOUNTER — Encounter: Payer: Self-pay | Admitting: Cardiology

## 2016-10-12 ENCOUNTER — Encounter (INDEPENDENT_AMBULATORY_CARE_PROVIDER_SITE_OTHER): Payer: Self-pay

## 2016-10-12 ENCOUNTER — Ambulatory Visit (INDEPENDENT_AMBULATORY_CARE_PROVIDER_SITE_OTHER): Payer: Medicare PPO | Admitting: Cardiology

## 2016-10-12 ENCOUNTER — Ambulatory Visit (INDEPENDENT_AMBULATORY_CARE_PROVIDER_SITE_OTHER): Payer: Medicare PPO | Admitting: *Deleted

## 2016-10-12 VITALS — BP 112/86 | HR 49 | Ht 72.0 in | Wt 211.0 lb

## 2016-10-12 DIAGNOSIS — Z9889 Other specified postprocedural states: Secondary | ICD-10-CM

## 2016-10-12 DIAGNOSIS — Z8679 Personal history of other diseases of the circulatory system: Secondary | ICD-10-CM | POA: Diagnosis not present

## 2016-10-12 DIAGNOSIS — I4892 Unspecified atrial flutter: Secondary | ICD-10-CM

## 2016-10-12 DIAGNOSIS — Z5181 Encounter for therapeutic drug level monitoring: Secondary | ICD-10-CM

## 2016-10-12 DIAGNOSIS — I483 Typical atrial flutter: Secondary | ICD-10-CM

## 2016-10-12 NOTE — Progress Notes (Signed)
Electrophysiology Office Note   Date:  10/12/2016   ID:  Cory Marks, DOB [birth year], MRN [id number]  PCP:  Dorena Dew, FNP  Cardiologist:  Wynonia Lawman Primary Electrophysiologist:  Malyia Moro Meredith Leeds, MD    Chief Complaint  Patient presents with  . Follow-up    Typical Flutter     History of Present Illness: Cory Marks is a 56 y.o. male who presents today for electrophysiology evaluation.   History of atrial flutter, hypertension, diabetes, hyperlipidemia, CKG stage III, history of mantle cell lymphoma.  Had ablation for atrial flutter 09/12/16. Since that time, he had been doing well. Not having chest pain or shortness of breath, and also does not feel like he had any episode of atrial flutter. He did have an episode of syncope versus near-syncope last Saturday. He says that he was chopping wood and when he got finished he felt dizzy and possibly passed out. EMS was called which didn't EKG that showed sinus beats interspersed with junctional beats.   Today, he denies symptoms of chest pain, shortness of breath, orthopnea, PND, lower extremity edema, claudication, dizziness, presyncope, syncope, bleeding, or neurologic sequela. The patient is tolerating medications without difficulties and is otherwise without complaint today.    Past Medical History:  Diagnosis Date  . Bell's palsy   . CAD (coronary artery disease)   . Cancer [facility]) 2012   Mantle Cell Cancer- S/P stem cell transplant  . CKD (chronic kidney disease) stage 3, GFR 30-59 ml/min   . Dysrhythmia    atrial fib and atrial flutter  . History of chemotherapy last tx 2014  . History of diabetes mellitus NONE SINCE 2012 CANCER  . History of splenectomy   . Hyperlipemia   . Hypertension   . Neuromuscular disorder ([place])    peripheral neuropathy  . Neuropathy ([place])    right leg due to football injury  . Polysubstance abuse   . Sinus bradycardia    Past Surgical History:  Procedure Laterality Date  . A-FLUTTER  ABLATION N/A 09/12/2016   Procedure: A-Flutter Ablation;  Surgeon: Vermelle Cammarata Meredith Leeds, MD;  Location: [place] [facility];  Service: Cardiovascular;  Laterality: N/A;  . AMPUTATION Right 10/20/2014   Procedure: AMPUTATION RIGHT GREAT TOE;  Surgeon: Gaynelle Arabian, MD;  Location: [facility];  Service: Orthopedics;  Laterality: Right;  . AMPUTATION Right 12/25/2014   Procedure: RIGHT 2ND TOE AMPUTATION ;  Surgeon: Wylene Simmer, MD;  Location: [place];  Service: Orthopedics;  Laterality: Right;  . edentulation N/A y-2   prior to transplant  . right knee surgery Right    nerve graft back of leg  . SPLENECTOMY, TOTAL     mantle cell lymphoma  . TENOLYSIS Right 12/25/2014   Procedure: RIGHT 3,4,5 FLEXOR TENDON RELEASE ;  Surgeon: Wylene Simmer, MD;  Location: [place];  Service: Orthopedics;  Laterality: Right;     Current Outpatient Prescriptions  Medication Sig Dispense Refill  . amLODipine (NORVASC) 10 MG tablet TAKE 1 TABLET EVERY MORNING 90 tablet 1  . gabapentin (NEURONTIN) 300 MG capsule Take 2 capsules (600 mg total) by mouth 2 (two) times daily. 120 capsule 0  . lisinopril (PRINIVIL,ZESTRIL) 20 MG tablet Take 1 tablet (20 mg total) by mouth daily. 90 tablet 3  . penicillin v potassium (VEETID) 500 MG tablet Take 500 mg by mouth daily.    Marland Kitchen pyridOXINE (VITAMIN B-6) 100 MG tablet Take 100 mg by mouth every morning.      No  current facility-administered medications for this visit.     Allergies:   Oxycodone   Social History:  The patient  reports that he has never smoked. He has never used smokeless tobacco. He reports that he drinks about 0.6 oz of alcohol per week . He reports that he uses drugs, including Marijuana.   Family History:  The patient's family history includes Cancer in his father; Diabetes in his father, mother, and sister; Heart disease in his father; Hypertension in his mother.    ROS:  Please see the history of present illness.   Otherwise,  review of systems is positive for syncope.   All other systems are reviewed and negative.    PHYSICAL EXAM: VS:  BP 112/86   Pulse (!) 49   Ht 6' (1.829 m)   Wt 211 lb (95.7 kg)   BMI 28.62 kg/m  , BMI Body mass index is 28.62 kg/m. GEN: Well nourished, well developed, in no acute distress  HEENT: normal  Neck: no JVD, carotid bruits, or masses Cardiac: bradycardic, regular; no murmurs, rubs, or gallops,no edema  Respiratory:  clear to auscultation bilaterally, normal work of breathing GI: soft, nontender, nondistended, + BS MS: no deformity or atrophy  Skin: warm and dry Neuro:  Strength and sensation are intact Psych: euthymic mood, full affect  EKG:  EKG is ordered today. Personal review of the ekg ordered shows sinus rhythm, rate 50  Recent Labs: 08/22/2016: ALT 18; Hemoglobin 14.8; TSH 2.92 09/09/2016: BUN 26; Creatinine, Ser 1.66; Platelets 226; Potassium 3.9; Sodium 143    Lipid Panel     Component Value Date/Time   CHOL 283 (H) 08/22/2016 0911   TRIG 150 (H) 08/22/2016 0911   HDL 73 08/22/2016 0911   CHOLHDL 3.9 08/22/2016 0911   VLDL 30 08/22/2016 0911   LDLCALC 180 (H) 08/22/2016 0911     Wt Readings from Last 3 Encounters:  10/12/16 211 lb (95.7 kg)  09/12/16 208 lb (94.3 kg)  09/09/16 208 lb 6.4 oz (94.5 kg)      Other studies Reviewed: Additional studies/ records that were reviewed today include: TTE 2016  Review of the above records today demonstrates:  - Left ventricle: The cavity size was normal. There was mild concentric hypertrophy. Systolic function was mildly to moderately reduced. The estimated ejection fraction was in the range of 40% to 45%. Mild diffuse hypokinesis. - Aortic valve: There was mild regurgitation. - Mitral valve: There was mild regurgitation. - Left atrium: The atrium was mildly dilated. - Right ventricle: The cavity size was dilated. Wall thickness was normal. - Right atrium: The atrium was mildly  dilated.   ASSESSMENT AND PLAN:  1.  Atrial flutter: Had ablation 09/12/16. Compliant with coumadin.He is in sinus rhythm today, we'll therefore stop his Coumadin. Has had no further episodes of atrial flutter.  This patients CHA2DS2-VASc Score and unadjusted Ischemic Stroke Rate (% per year) is equal to 0.6 % stroke rate/year from a score of 1  Above score calculated as 1 point each if present [CHF, HTN, DM, Vascular=MI/PAD/Aortic Plaque, Age if 65-74, or Male] Above score calculated as 2 points each if present [Age > 75, or Stroke/TIA/TE]  2. Hypertension: currently well-controlled  3. Syncope: It appears that his syncope was related to his exertion, when he was lifting firewood and moving it around his yard. He has not had any episodes of syncope similar to that and none since. EKGs that were done showed junctional rhythm interspersed with sinus beats. It  is likely that his episode of syncope was due to his exercise. I have told him that it is likely not necessary for him to stop driving. I Sachin Ferencz see him back in 3 months for further evaluation.  Current medicines are reviewed at length with the patient today.   The patient does not have concerns regarding his medicines.  The following changes were made today: stop coumadin  Labs/ tests ordered today include:  Orders Placed This Encounter  Procedures  . EKG 12-Lead     Disposition:   FU with Andre Swander 3 months  Signed, Tacy Chavis Meredith Leeds, MD  10/12/2016 9:03 AM     [facility] [address] [place] [address] [phone] (office) [phone] (fax)

## 2016-10-12 NOTE — Patient Instructions (Signed)
Medication Instructions:    Your physician has recommended you make the following change in your medication: 1) STOP Coumadin  --- If you need a refill on your cardiac medications before your next appointment, please call your pharmacy. ---  Labwork:  None ordered  Testing/Procedures:  None ordered  Follow-Up:  Your physician recommends that you schedule a follow-up appointment in: 3 months with Dr. Curt Bears.  Thank you for choosing [facility]!!   Trinidad Curet, RN [phone]

## 2016-10-14 ENCOUNTER — Other Ambulatory Visit: Payer: Self-pay | Admitting: Family Medicine

## 2016-10-14 DIAGNOSIS — E118 Type 2 diabetes mellitus with unspecified complications: Secondary | ICD-10-CM

## 2016-10-14 DIAGNOSIS — M869 Osteomyelitis, unspecified: Secondary | ICD-10-CM

## 2016-10-26 ENCOUNTER — Telehealth: Payer: Self-pay

## 2016-10-26 NOTE — Telephone Encounter (Signed)
Rx for Diabetic Shoes has been sent into [facility] today 10/26/2016 @10 :35am. Thanks!

## 2016-11-03 ENCOUNTER — Encounter: Payer: Self-pay | Admitting: Family Medicine

## 2016-11-03 ENCOUNTER — Ambulatory Visit (INDEPENDENT_AMBULATORY_CARE_PROVIDER_SITE_OTHER): Payer: Medicare PPO | Admitting: Family Medicine

## 2016-11-03 VITALS — BP 126/84 | HR 55 | Temp 98.6°F | Ht 72.0 in | Wt 213.0 lb

## 2016-11-03 DIAGNOSIS — E785 Hyperlipidemia, unspecified: Secondary | ICD-10-CM

## 2016-11-03 DIAGNOSIS — IMO0002 Reserved for concepts with insufficient information to code with codable children: Secondary | ICD-10-CM

## 2016-11-03 DIAGNOSIS — G629 Polyneuropathy, unspecified: Secondary | ICD-10-CM | POA: Diagnosis not present

## 2016-11-03 DIAGNOSIS — I10 Essential (primary) hypertension: Secondary | ICD-10-CM

## 2016-11-03 DIAGNOSIS — M79671 Pain in right foot: Secondary | ICD-10-CM | POA: Diagnosis not present

## 2016-11-03 DIAGNOSIS — E118 Type 2 diabetes mellitus with unspecified complications: Secondary | ICD-10-CM | POA: Diagnosis not present

## 2016-11-03 DIAGNOSIS — Z89411 Acquired absence of right great toe: Secondary | ICD-10-CM | POA: Diagnosis not present

## 2016-11-03 LAB — POCT GLYCOSYLATED HEMOGLOBIN (HGB A1C): Hemoglobin A1C: 5.7

## 2016-11-03 MED ORDER — TRAMADOL HCL 50 MG PO TABS: 50.0000 mg | ORAL_TABLET | Freq: Three times a day (TID) | ORAL | 0 refills | Status: DC | PRN

## 2016-11-03 MED ORDER — KETOROLAC TROMETHAMINE 60 MG/2ML IM SOLN
60.0000 mg | Freq: Once | INTRAMUSCULAR | Status: AC
  Administered 2016-11-03: 60 mg via INTRAMUSCULAR

## 2016-11-03 MED ORDER — GABAPENTIN 300 MG PO CAPS: 300.0000 mg | ORAL_CAPSULE | Freq: Two times a day (BID) | ORAL | 5 refills | Status: DC

## 2016-11-03 NOTE — Patient Instructions (Addendum)
Patient received a Toradol 60 mg IM injection without problems.  Will start Tramadol 50 mg every 8 hours for moderate to severe right foot pain. Do not drink alcohol, drive, or operate machinery while taking this medication.  Recommend that patient follow up with podiatry.  Foot Pain Many things can cause foot pain. Some common causes are:  An injury.  A sprain.  Arthritis.  Blisters.  Bunions. Follow these instructions at home: Pay attention to any changes in your symptoms. Take these actions to help with your discomfort:  If directed, put ice on the affected area:  Put ice in a plastic bag.  Place a towel between your skin and the bag.  Leave the ice on for 15-20 minutes, 3?4 times a day for 2 days.  Take over-the-counter and prescription medicines only as told by your health care provider.  Wear comfortable, supportive shoes that fit you well. Do not wear high heels.  Do not stand or walk for long periods of time.  Do not lift a lot of weight. This can put added pressure on your feet.  Do stretches to relieve foot pain and stiffness as told by your health care provider.  Rub your foot gently.  Keep your feet clean and dry. Contact a health care provider if:  Your pain does not get better after a few days of self-care.  Your pain gets worse.  You cannot stand on your foot. Get help right away if:  Your foot is numb or tingling.  Your foot or toes are swollen.  Your foot or toes turn white or blue.  You have warmth and redness along your foot. This information is not intended to replace advice given to you by your health care provider. Make sure you discuss any questions you have with your health care provider. Document Released: 08/21/2015 Document Revised: 12/31/2015 Document Reviewed: 08/20/2014 Elsevier Interactive Patient Education  2017 [employer].

## 2016-11-03 NOTE — Progress Notes (Signed)
Subjective:    Patient ID: Cory Marks, male    DOB: 01/05/1961, 56 y.o.   MRN: [id number]  Hypertension  This is a recurrent problem. The current episode started 1 to 4 weeks ago. The problem has been gradually improving since onset. The problem is controlled. Pertinent negatives include no anxiety, blurred vision, chest pain, headaches, malaise/fatigue, neck pain, palpitations, peripheral edema or shortness of breath. Risk factors for coronary artery disease include diabetes mellitus. Past treatments include ACE inhibitors and calcium channel blockers. The current treatment provides significant improvement. There are no compliance problems.  Hypertensive end-organ damage includes kidney disease. Identifiable causes of hypertension include chronic renal disease. There is no history of sleep apnea or a thyroid problem.  Diabetes  He presents for his follow-up diabetic visit. He has type 2 diabetes mellitus. No MedicAlert identification noted. His disease course has been improving. Pertinent negatives for hypoglycemia include no dizziness or headaches. Associated symptoms include foot paresthesias. Pertinent negatives for diabetes include no blurred vision, no chest pain, no fatigue, no foot ulcerations, no polydipsia, no polyphagia, no polyuria, no visual change and no weakness. Symptoms are stable. Risk factors for coronary artery disease include diabetes mellitus, dyslipidemia and male sex. He participates in exercise intermittently. An ACE inhibitor/angiotensin II receptor blocker is being taken. He does not see a podiatrist.Eye exam is current.  Hyperlipidemia  This is a recurrent problem. The problem is uncontrolled. Recent lipid tests were reviewed and are high. Exacerbating diseases include chronic renal disease and diabetes. He has no history of hypothyroidism, liver disease, obesity or nephrotic syndrome. There are no known factors aggravating his hyperlipidemia. Pertinent negatives include no  chest pain, myalgias or shortness of breath. Current antihyperlipidemic treatment includes statins. The current treatment provides mild improvement of lipids. There are no compliance problems.  Risk factors for coronary artery disease include dyslipidemia, hypertension and diabetes mellitus.  Foot Pain  This is a chronic (Previous amputation to right 1st, 2nd, and 3rd toes, pain occurring in those areas. He states that it feels as if he's "growing new toes". ) problem. The current episode started more than 1 year ago. The problem occurs constantly. The problem has been unchanged. Associated symptoms include arthralgias. Pertinent negatives include no abdominal pain, anorexia, chest pain, chills, congestion, fatigue, headaches, myalgias, neck pain, rash, vertigo, visual change, vomiting or weakness. The symptoms are aggravated by walking and standing.   Past Medical History:  Diagnosis Date  . Bell's palsy   . CAD (coronary artery disease)   . Cancer [facility]) 2012   Mantle Cell Cancer- S/P stem cell transplant  . CKD (chronic kidney disease) stage 3, GFR 30-59 ml/min   . Dysrhythmia    atrial fib and atrial flutter  . History of chemotherapy last tx 2014  . History of diabetes mellitus NONE SINCE 2012 CANCER  . History of splenectomy   . Hyperlipemia   . Hypertension   . Neuromuscular disorder ([place])    peripheral neuropathy  . Neuropathy ([place])    right leg due to football injury  . Polysubstance abuse   . Sinus bradycardia    Social History   Social History  . Marital status: Married    Spouse name: N/A  . Number of children: N/A  . Years of education: N/A   Occupational History  . Not on file.   Social History Main Topics  . Smoking status: Never Smoker  . Smokeless tobacco: Never Used  . Alcohol use 0.6 oz/week  1 Cans of beer per week     Comment: does not drink every day.  no history of withdrawal   . Drug use: Yes    Types: Marijuana     Comment: hx cocaine use 3-4 yrs  ago, hx prescipriotn drug abuse 2012, none since  . Sexual activity: Yes    Partners: Female   Other Topics Concern  . Not on file   Social History Narrative  . No narrative on file   Allergies  Allergen Reactions  . Oxycodone Itching and Palpitations    Immunization History  Administered Date(s) Administered  . DTaP 05/30/2013  . Influenza,inj,Quad PF,36+ Mos 05/30/2014, 06/15/2015, 03/29/2016  . MMR 05/30/2013  . Pneumococcal Conjugate-13 12/11/2014  . Pneumococcal-Unspecified 05/30/2013  . Tdap 05/30/2013   Review of Systems  Constitutional: Negative.  Negative for chills, fatigue and malaise/fatigue.  HENT: Negative.  Negative for congestion.   Eyes: Negative.  Negative for blurred vision.  Respiratory: Negative.  Negative for shortness of breath.   Cardiovascular: Negative.  Negative for chest pain, palpitations and leg swelling.  Gastrointestinal: Negative.  Negative for abdominal pain, anorexia, constipation and vomiting.  Endocrine: Negative.  Negative for polydipsia, polyphagia and polyuria.  Genitourinary: Negative.  Negative for hematuria.  Musculoskeletal: Positive for arthralgias. Negative for myalgias and neck pain.       Previous amputation to right toes. Right foot pain  Skin: Negative.  Negative for rash and wound.  Allergic/Immunologic: Negative.   Neurological: Negative.  Negative for dizziness, vertigo, weakness and headaches.  Hematological: Negative.   Psychiatric/Behavioral: Negative.  Negative for sleep disturbance.       Objective:   Physical Exam  Constitutional: He is oriented to person, place, and time. He appears well-developed and well-nourished.  HENT:  Head: Normocephalic and atraumatic.  Right Ear: External ear normal.  Left Ear: External ear normal.  Mouth/Throat: Oropharynx is clear and moist.  Eyes: Conjunctivae and EOM are normal. Pupils are equal, round, and reactive to light.  Neck: Normal range of motion. Neck supple.   Cardiovascular: Normal rate, regular rhythm, normal heart sounds and intact distal pulses.   Pulmonary/Chest: Effort normal and breath sounds normal.  Abdominal: Soft. Bowel sounds are normal.  Musculoskeletal: Normal range of motion.  Neurological: He is alert and oriented to person, place, and time. He has normal reflexes.  Monofilament test abnormal Pain to right foot, Previous amputations to 1st, 2nd, and 3rd toes of right foot.   Skin: Skin is warm and dry.  Psychiatric: He has a normal mood and affect. His behavior is normal. Judgment and thought content normal.      BP 126/84 (BP Location: Right Arm, Patient Position: Sitting, Cuff Size: Small)   Pulse (!) 55   Temp 98.6 F (37 C) (Oral)   Ht 6' (1.829 m)   Wt 213 lb (96.6 kg)   SpO2 96%   BMI 28.89 kg/m   Assessment & Plan:  1. Controlled type 2 diabetes mellitus with complication, without long-term current use of insulin (HCC) Recommend a lowfat, low carbohydrate diet divided over 5-6 small meals, increase water intake to 6-8 glasses, and 150 minutes per week of cardiovascular exercise.   - HgB A1c  2. Essential hypertension Blood pressure is at goal on current medication regimen. Patient is to continue medications.   3. Hyperlipidemia, unspecified hyperlipidemia type Continue statin therapy. The patient is asked to make an attempt to improve diet and exercise patterns to aid in medical management of this problem.  4. Right foot pain - traMADol (ULTRAM) 50 MG tablet; Take 1 tablet (50 mg total) by mouth every 8 (eight) hours as needed.  Dispense: 30 tablet; Refill: 0 - ketorolac (TORADOL) injection 60 mg; Inject 2 mLs (60 mg total) into the muscle once.  5. Great toe amputation status, right ([place]) Refer to #4  6. Neuropathy (HCC) - gabapentin (NEURONTIN) 300 MG capsule; Take 1 capsule (300 mg total) by mouth 2 (two) times daily.  Dispense: 120 capsule; Refill: 5  Routine Health Maintenance: Colonoscopy 2 years  ago Follow up for opthalmology exam as scheduled     RTC: 3 months for chronic conditions    Tecla Mailloux Al Decant  MSN, FNP-C [facility] [place], [place] [address] [phone]

## 2016-11-04 DIAGNOSIS — G629 Polyneuropathy, unspecified: Secondary | ICD-10-CM | POA: Insufficient documentation

## 2016-11-04 DIAGNOSIS — S98131S Complete traumatic amputation of one right lesser toe, sequela: Secondary | ICD-10-CM | POA: Insufficient documentation

## 2016-11-04 DIAGNOSIS — M79671 Pain in right foot: Secondary | ICD-10-CM | POA: Insufficient documentation

## 2016-11-10 ENCOUNTER — Telehealth: Payer: Self-pay

## 2016-11-10 NOTE — Telephone Encounter (Signed)
[place], Can you re-print rx's so we can fax them into pharmacy. Please advise. Thanks!

## 2016-11-11 NOTE — Telephone Encounter (Signed)
Faxed in rx's for Gabapentin and Tramadol to [facility] today 11/11/2016@1 :53pm. Thanks!

## 2016-11-21 ENCOUNTER — Ambulatory Visit: Payer: Medicare PPO | Admitting: Family Medicine

## 2016-12-13 ENCOUNTER — Other Ambulatory Visit: Payer: Self-pay | Admitting: Family Medicine

## 2016-12-13 DIAGNOSIS — G629 Polyneuropathy, unspecified: Secondary | ICD-10-CM

## 2016-12-13 MED ORDER — GABAPENTIN 300 MG PO CAPS: 300.0000 mg | ORAL_CAPSULE | Freq: Three times a day (TID) | ORAL | 5 refills | Status: DC

## 2016-12-27 ENCOUNTER — Ambulatory Visit: Payer: Self-pay | Admitting: Orthopedic Surgery

## 2017-01-12 NOTE — Progress Notes (Signed)
Electrophysiology Office Note   Date:  01/16/2017   ID:  Cory Marks, DOB 1960-11-27, MRN [id number]  PCP:  Dorena Dew, FNP  Cardiologist:  Wynonia Lawman Primary Electrophysiologist:  Dyan Creelman Meredith Leeds, MD    Chief Complaint  Patient presents with  . Follow-up    Typical Aflutter     History of Present Illness: Cory Marks is a 56 y.o. male who presents today for electrophysiology evaluation.   History of atrial flutter, hypertension, diabetes, hyperlipidemia, CKG stage III, history of mantle cell lymphoma.  Had ablation for atrial flutter 09/12/16. He has been doing well since that time. He has not noted any limitations. He does have pain in his left arm he feels is due to yard work over the weekend. He does not have pain with exertion, shortness of breath, fatigue, or weakness.   Today, denies symptoms of palpitations, chest pain, shortness of breath, orthopnea, PND, lower extremity edema, claudication, dizziness, presyncope, syncope, bleeding, or neurologic sequela. The patient is tolerating medications without difficulties and is otherwise without complaint today.     Past Medical History:  Diagnosis Date  . Bell's palsy   . CAD (coronary artery disease)   . Cancer [facility]) 2012   Mantle Cell Cancer- S/P stem cell transplant  . CKD (chronic kidney disease) stage 3, GFR 30-59 ml/min   . Dysrhythmia    atrial fib and atrial flutter  . History of chemotherapy last tx 2014  . History of diabetes mellitus NONE SINCE 2012 CANCER  . History of splenectomy   . Hyperlipemia   . Hypertension   . Neuromuscular disorder ([place])    peripheral neuropathy  . Neuropathy ([place])    right leg due to football injury  . Polysubstance abuse   . Sinus bradycardia    Past Surgical History:  Procedure Laterality Date  . A-FLUTTER ABLATION N/A 09/12/2016   Procedure: A-Flutter Ablation;  Surgeon: Jeyson Deshotel Meredith Leeds, MD;  Location: [facility];  Service: Cardiovascular;  Laterality:  N/A;  . AMPUTATION Right 10/20/2014   Procedure: AMPUTATION RIGHT GREAT TOE;  Surgeon: Gaynelle Arabian, MD;  Location: [facility];  Service: Orthopedics;  Laterality: Right;  . AMPUTATION Right 12/25/2014   Procedure: RIGHT 2ND TOE AMPUTATION ;  Surgeon: Wylene Simmer, MD;  Location: [facility];  Service: Orthopedics;  Laterality: Right;  . edentulation N/A y-2   prior to transplant  . right knee surgery Right    nerve graft back of leg  . SPLENECTOMY, TOTAL     mantle cell lymphoma  . TENOLYSIS Right 12/25/2014   Procedure: RIGHT 3,4,5 FLEXOR TENDON RELEASE ;  Surgeon: Wylene Simmer, MD;  Location: [facility];  Service: Orthopedics;  Laterality: Right;     Current Outpatient Prescriptions  Medication Sig Dispense Refill  . amLODipine (NORVASC) 10 MG tablet TAKE 1 TABLET EVERY MORNING 90 tablet 1  . gabapentin (NEURONTIN) 300 MG capsule Take 1 capsule (300 mg total) by mouth 3 (three) times daily. 90 capsule 5  . lisinopril (PRINIVIL,ZESTRIL) 20 MG tablet Take 1 tablet (20 mg total) by mouth daily. 90 tablet 3  . penicillin v potassium (VEETID) 500 MG tablet Take 500 mg by mouth daily.    Marland Kitchen pyridOXINE (VITAMIN B-6) 100 MG tablet Take 100 mg by mouth every morning.     . traMADol (ULTRAM) 50 MG tablet Take 1 tablet (50 mg total) by mouth every 8 (eight) hours as needed. 30 tablet 0   No current facility-administered  medications for this visit.     Allergies:   Oxycodone   Social History:  The patient  reports that he has never smoked. He has never used smokeless tobacco. He reports that he drinks about 0.6 oz of alcohol per week . He reports that he uses drugs, including Marijuana.   Family History:  The patient's family history includes Cancer in his father; Diabetes in his father, mother, and sister; Heart disease in his father; Hypertension in his mother.    ROS:  Please see the history of present illness.   Otherwise, review of systems is positive for none.    All other systems are reviewed and negative.     PHYSICAL EXAM: VS:  There were no vitals taken for this visit. , BMI There is no height or weight on file to calculate BMI. GEN: Well nourished, well developed, in no acute distress  HEENT: normal  Neck: no JVD, carotid bruits, or masses Cardiac: RRR; no murmurs, rubs, or gallops,no edema  Respiratory:  clear to auscultation bilaterally, normal work of breathing GI: soft, nontender, nondistended, + BS MS: no deformity or atrophy  Skin: warm and dry Neuro:  Strength and sensation are intact Psych: euthymic mood, full affect  EKG:  EKG is ordered today. Personal review of the ekg ordered shows sinus rhythm, rate 53  Recent Labs: 08/22/2016: ALT 18; TSH 2.92 09/09/2016: BUN 26; Creatinine, Ser 1.66; Hemoglobin 14.7; Platelets 226; Potassium 3.9; Sodium 143    Lipid Panel     Component Value Date/Time   CHOL 283 (H) 08/22/2016 0911   TRIG 150 (H) 08/22/2016 0911   HDL 73 08/22/2016 0911   CHOLHDL 3.9 08/22/2016 0911   VLDL 30 08/22/2016 0911   LDLCALC 180 (H) 08/22/2016 0911     Wt Readings from Last 3 Encounters:  11/03/16 213 lb (96.6 kg)  10/12/16 211 lb (95.7 kg)  09/12/16 208 lb (94.3 kg)      Other studies Reviewed: Additional studies/ records that were reviewed today include: TTE 2016  Review of the above records today demonstrates:  - Left ventricle: The cavity size was normal. There was mild concentric hypertrophy. Systolic function was mildly to moderately reduced. The estimated ejection fraction was in the range of 40% to 45%. Mild diffuse hypokinesis. - Aortic valve: There was mild regurgitation. - Mitral valve: There was mild regurgitation. - Left atrium: The atrium was mildly dilated. - Right ventricle: The cavity size was dilated. Wall thickness was normal. - Right atrium: The atrium was mildly dilated.   ASSESSMENT AND PLAN:  1.  Atrial flutter: Status post ablation without recurrence of  typical atrial flutter. Currently not anticoagulated. No changes at this time. We'll see him back on an as-needed basis.  This patients CHA2DS2-VASc Score and unadjusted Ischemic Stroke Rate (% per year) is equal to 0.6 % stroke rate/year from a score of 1  Above score calculated as 1 point each if present [CHF, HTN, DM, Vascular=MI/PAD/Aortic Plaque, Age if 65-74, or Male] Above score calculated as 2 points each if present [Age > 75, or Stroke/TIA/TE]  2. Hypertension: Currently well controlled. No changes at this time.  3. Syncope: Has had no further episodes. Continue monitoring.  4. Preoperative cardiovascular evaluation: Plan to have knee replacement on his left knee later this month. Currently has no chest pain or shortness of breath. He would be at intermediate risk for an intermediate risk procedure. No medication changes necessary at this time.  Current medicines are reviewed at  length with the patient today.   The patient does not have concerns regarding his medicines.  The following changes were made today: stop coumadin  Labs/ tests ordered today include:  No orders of the defined types were placed in this encounter.    Disposition:   FU with Kiylah Loyer PRN  Signed, Filippa Yarbough Meredith Leeds, MD  01/16/2017 9:44 AM     [place] [place] [address] [place] [address] [phone] (office) [phone] (fax)

## 2017-01-16 ENCOUNTER — Encounter: Payer: Self-pay | Admitting: Cardiology

## 2017-01-16 ENCOUNTER — Encounter (INDEPENDENT_AMBULATORY_CARE_PROVIDER_SITE_OTHER): Payer: Self-pay

## 2017-01-16 ENCOUNTER — Ambulatory Visit (INDEPENDENT_AMBULATORY_CARE_PROVIDER_SITE_OTHER): Payer: Medicare PPO | Admitting: Cardiology

## 2017-01-16 VITALS — BP 122/88 | HR 53 | Ht 72.0 in | Wt 203.6 lb

## 2017-01-16 DIAGNOSIS — I483 Typical atrial flutter: Secondary | ICD-10-CM | POA: Diagnosis not present

## 2017-01-16 NOTE — Patient Instructions (Signed)
Medication Instructions:  Your physician recommends that you continue on your current medications as directed. Please refer to the Current Medication list given to you today.  * If you need a refill on your cardiac medications before your next appointment, please call your pharmacy.   Labwork: None ordered  Testing/Procedures: None ordered  Follow-Up: No follow up is needed at this time with Dr. Camnitz.  He will see you on an as needed basis.   Thank you for choosing [facility]!!   Aiya Keach, RN [phone]     

## 2017-01-23 NOTE — Patient Instructions (Signed)
Cory Marks  01/23/2017   Your procedure is scheduled on:  01/30/2017   Report to [facility] Main  Entrance   Report to admitting at   0700 AM   Call this number if you have problems the morning of surgery  [phone]   Remember: ONLY 1 PERSON MAY GO WITH YOU TO SHORT STAY TO GET  READY MORNING OF YOUR SURGERY.  Do not eat food or drink liquids :After Midnight.     Take these medicines the morning of surgery with A SIP OF WATER: amlodipine ( NORvasc), Gabapentin                                 You may not have any metal on your body including hair pins and              piercings  Do not wear jewelry,  lotions, powders or perfumes, deodorant                        Men may shave face and neck.   Do not bring valuables to the hospital. [place].  Contacts, dentures or bridgework may not be worn into surgery.  Leave suitcase in the car. After surgery it may be brought to your room.                 Please read over the following fact sheets you were given: _____________________________________________________________________             [facility] - Preparing for Surgery Before surgery, you can play an important role.  Because skin is not sterile, your skin needs to be as free of germs as possible.  You can reduce the number of germs on your skin by washing with CHG (chlorahexidine gluconate) soap before surgery.  CHG is an antiseptic cleaner which kills germs and bonds with the skin to continue killing germs even after washing. Please DO NOT use if you have an allergy to CHG or antibacterial soaps.  If your skin becomes reddened/irritated stop using the CHG and inform your nurse when you arrive at Short Stay. Do not shave (including legs and underarms) for at least 48 hours prior to the first CHG shower.  You may shave your face/neck. Please follow these instructions carefully:  1.  Shower with CHG  Soap the night before surgery and the  morning of Surgery.  2.  If you choose to wash your hair, wash your hair first as usual with your  normal  shampoo.  3.  After you shampoo, rinse your hair and body thoroughly to remove the  shampoo.                           4.  Use CHG as you would any other liquid soap.  You can apply chg directly  to the skin and wash                       Gently with a scrungie or clean washcloth.  5.  Apply the CHG Soap to your body ONLY FROM THE NECK DOWN.   Do not use on face/  open                           Wound or open sores. Avoid contact with eyes, ears mouth and genitals (private parts).                       Wash face,  Genitals (private parts) with your normal soap.             6.  Wash thoroughly, paying special attention to the area where your surgery  will be performed.  7.  Thoroughly rinse your body with warm water from the neck down.  8.  DO NOT shower/wash with your normal soap after using and rinsing off  the CHG Soap.                9.  Pat yourself dry with a clean towel.            10.  Wear clean pajamas.            11.  Place clean sheets on your bed the night of your first shower and do not  sleep with pets. Day of Surgery : Do not apply any lotions/deodorants the morning of surgery.  Please wear clean clothes to the hospital/surgery center.  FAILURE TO FOLLOW THESE INSTRUCTIONS MAY RESULT IN THE CANCELLATION OF YOUR SURGERY PATIENT SIGNATURE_________________________________  NURSE SIGNATURE__________________________________  ________________________________________________________________________  WHAT IS A BLOOD TRANSFUSION? Blood Transfusion Information  A transfusion is the replacement of blood or some of its parts. Blood is made up of multiple cells which provide different functions.  Red blood cells carry oxygen and are used for blood loss replacement.  White blood cells fight against infection.  Platelets control  bleeding.  Plasma helps clot blood.  Other blood products are available for specialized needs, such as hemophilia or other clotting disorders. BEFORE THE TRANSFUSION  Who gives blood for transfusions?   Healthy volunteers who are fully evaluated to make sure their blood is safe. This is blood bank blood. Transfusion therapy is the safest it has ever been in the practice of medicine. Before blood is taken from a donor, a complete history is taken to make sure that person has no history of diseases nor engages in risky social behavior (examples are intravenous drug use or sexual activity with multiple partners). The donor's travel history is screened to minimize risk of transmitting infections, such as malaria. The donated blood is tested for signs of infectious diseases, such as HIV and hepatitis. The blood is then tested to be sure it is compatible with you in order to minimize the chance of a transfusion reaction. If you or a relative donates blood, this is often done in anticipation of surgery and is not appropriate for emergency situations. It takes many days to process the donated blood. RISKS AND COMPLICATIONS Although transfusion therapy is very safe and saves many lives, the main dangers of transfusion include:   Getting an infectious disease.  Developing a transfusion reaction. This is an allergic reaction to something in the blood you were given. Every precaution is taken to prevent this. The decision to have a blood transfusion has been considered carefully by your caregiver before blood is given. Blood is not given unless the benefits outweigh the risks. AFTER THE TRANSFUSION  Right after receiving a blood transfusion, you will usually feel much better and more energetic. This is especially true if your red blood  cells have gotten low (anemic). The transfusion raises the level of the red blood cells which carry oxygen, and this usually causes an energy increase.  The nurse  administering the transfusion will monitor you carefully for complications. HOME CARE INSTRUCTIONS  No special instructions are needed after a transfusion. You may find your energy is better. Speak with your caregiver about any limitations on activity for underlying diseases you may have. SEEK MEDICAL CARE IF:   Your condition is not improving after your transfusion.  You develop redness or irritation at the intravenous (IV) site. SEEK IMMEDIATE MEDICAL CARE IF:  Any of the following symptoms occur over the next 12 hours:  Shaking chills.  You have a temperature by mouth above 102 F (38.9 C), not controlled by medicine.  Chest, back, or muscle pain.  People around you feel you are not acting correctly or are confused.  Shortness of breath or difficulty breathing.  Dizziness and fainting.  You get a rash or develop hives.  You have a decrease in urine output.  Your urine turns a dark color or changes to pink, red, or brown. Any of the following symptoms occur over the next 10 days:  You have a temperature by mouth above 102 F (38.9 C), not controlled by medicine.  Shortness of breath.  Weakness after normal activity.  The white part of the eye turns yellow (jaundice).  You have a decrease in the amount of urine or are urinating less often.  Your urine turns a dark color or changes to pink, red, or brown. Document Released: 07/22/2000 Document Revised: 10/17/2011 Document Reviewed: 03/10/2008 ExitCare Patient Information 2014 [place].  _______________________________________________________________________  Incentive Spirometer  An incentive spirometer is a tool that can help keep your lungs clear and active. This tool measures how well you are filling your lungs with each breath. Taking long deep breaths may help reverse or decrease the chance of developing breathing (pulmonary) problems (especially infection) following:  A long period of time when you are  unable to move or be active. BEFORE THE PROCEDURE   If the spirometer includes an indicator to show your best effort, your nurse or respiratory therapist will set it to a desired goal.  If possible, sit up straight or lean slightly forward. Try not to slouch.  Hold the incentive spirometer in an upright position. INSTRUCTIONS FOR USE  1. Sit on the edge of your bed if possible, or sit up as far as you can in bed or on a chair. 2. Hold the incentive spirometer in an upright position. 3. Breathe out normally. 4. Place the mouthpiece in your mouth and seal your lips tightly around it. 5. Breathe in slowly and as deeply as possible, raising the piston or the ball toward the top of the column. 6. Hold your breath for 3-5 seconds or for as long as possible. Allow the piston or ball to fall to the bottom of the column. 7. Remove the mouthpiece from your mouth and breathe out normally. 8. Rest for a few seconds and repeat Steps 1 through 7 at least 10 times every 1-2 hours when you are awake. Take your time and take a few normal breaths between deep breaths. 9. The spirometer may include an indicator to show your best effort. Use the indicator as a goal to work toward during each repetition. 10. After each set of 10 deep breaths, practice coughing to be sure your lungs are clear. If you have an incision (the cut made  at the time of surgery), support your incision when coughing by placing a pillow or rolled up towels firmly against it. Once you are able to get out of bed, walk around indoors and cough well. You may stop using the incentive spirometer when instructed by your caregiver.  RISKS AND COMPLICATIONS  Take your time so you do not get dizzy or light-headed.  If you are in pain, you may need to take or ask for pain medication before doing incentive spirometry. It is harder to take a deep breath if you are having pain. AFTER USE  Rest and breathe slowly and easily.  It can be helpful to  keep track of a log of your progress. Your caregiver can provide you with a simple table to help with this. If you are using the spirometer at home, follow these instructions: [place] IF:   You are having difficultly using the spirometer.  You have trouble using the spirometer as often as instructed.  Your pain medication is not giving enough relief while using the spirometer.  You develop fever of 100.5 F (38.1 C) or higher. SEEK IMMEDIATE MEDICAL CARE IF:   You cough up bloody sputum that had not been present before.  You develop fever of 102 F (38.9 C) or greater.  You develop worsening pain at or near the incision site. MAKE SURE YOU:   Understand these instructions.  Will watch your condition.  Will get help right away if you are not doing well or get worse. Document Released: 12/05/2006 Document Revised: 10/17/2011 Document Reviewed: 02/05/2007 [facility] Patient Information 2014 [place], [place].   ________________________________________________________________________

## 2017-01-25 ENCOUNTER — Encounter (HOSPITAL_COMMUNITY)
Admission: RE | Admit: 2017-01-25 | Discharge: 2017-01-25 | Disposition: A | Discharge status: Home / Self Care | Payer: Medicare PPO | Source: Ambulatory Visit | Attending: Orthopedic Surgery | Admitting: Orthopedic Surgery

## 2017-01-25 ENCOUNTER — Encounter (HOSPITAL_COMMUNITY): Payer: Self-pay

## 2017-01-25 DIAGNOSIS — Z01812 Encounter for preprocedural laboratory examination: Secondary | ICD-10-CM | POA: Diagnosis not present

## 2017-01-25 DIAGNOSIS — M1712 Unilateral primary osteoarthritis, left knee: Secondary | ICD-10-CM | POA: Diagnosis not present

## 2017-01-25 DIAGNOSIS — Z0183 Encounter for blood typing: Secondary | ICD-10-CM | POA: Insufficient documentation

## 2017-01-25 HISTORY — DX: Type 2 diabetes mellitus without complications: E11.9

## 2017-01-25 HISTORY — DX: Unspecified osteoarthritis, unspecified site: M19.90

## 2017-01-25 LAB — CBC
HCT: 41.7 % (ref 39.0–52.0)
Hemoglobin: 14 g/dL (ref 13.0–17.0)
MCH: 32.9 pg (ref 26.0–34.0)
MCHC: 33.6 g/dL (ref 30.0–36.0)
MCV: 97.9 fL (ref 78.0–100.0)
Platelets: 196 10*3/uL (ref 150–400)
RBC: 4.26 MIL/uL (ref 4.22–5.81)
RDW: 14.3 % (ref 11.5–15.5)
WBC: 5 10*3/uL (ref 4.0–10.5)

## 2017-01-25 LAB — SURGICAL PCR SCREEN
MRSA, PCR: NEGATIVE
Staphylococcus aureus: NEGATIVE

## 2017-01-25 LAB — COMPREHENSIVE METABOLIC PANEL
ALT: 14 U/L — ABNORMAL LOW (ref 17–63)
AST: 35 U/L (ref 15–41)
Albumin: 4.1 g/dL (ref 3.5–5.0)
Alkaline Phosphatase: 41 U/L (ref 38–126)
Anion gap: 10 (ref 5–15)
BUN: 27 mg/dL — ABNORMAL HIGH (ref 6–20)
CALCIUM: 9.6 mg/dL (ref 8.9–10.3)
CO2: 24 mmol/L (ref 22–32)
CREATININE: 1.68 mg/dL — AB (ref 0.61–1.24)
Chloride: 108 mmol/L (ref 101–111)
GFR calc Af Amer: 51 mL/min — ABNORMAL LOW (ref >60)
GFR calc non Af Amer: 44 mL/min — ABNORMAL LOW (ref >60)
Glucose, Bld: 115 mg/dL — ABNORMAL HIGH (ref 65–99)
Potassium: 4.4 mmol/L (ref 3.5–5.1)
SODIUM: 142 mmol/L (ref 135–145)
Total Bilirubin: 1 mg/dL (ref 0.3–1.2)
Total Protein: 6.2 g/dL — ABNORMAL LOW (ref 6.5–8.1)

## 2017-01-25 LAB — APTT: aPTT: 28 seconds (ref 24–36)

## 2017-01-25 LAB — PROTIME-INR
INR: 0.91
Prothrombin Time: 12.2 seconds (ref 11.4–15.2)

## 2017-01-25 NOTE — Progress Notes (Signed)
CMp done 01/25/17 faxed via epic to Dr Wynelle Link.

## 2017-01-25 NOTE — Progress Notes (Signed)
Clearance- 09/27/16- EKG-01/16/17-epic  LOV with cardiology- 01/16/17-epic  09/12/16-aflutter ablation  ECHO- 2016-epic

## 2017-01-28 ENCOUNTER — Ambulatory Visit: Payer: Self-pay | Admitting: Orthopedic Surgery

## 2017-01-28 NOTE — H&P (Signed)
Cory Marks DOB: [birth year] Single / Language: Cleophus Molt / Race: Black or African American Male Date of Admission: 01/30/2017 CC:  Left Knee Pain History of Present Illness The patient is a 56 year old male who comes in for a preoperative History and Physical. The patient is scheduled for a left total knee arthroplasty to be performed by Dr. Dione Plover. Aluisio, MD at [facility] on 01-30-2017. The patient is a 56 year old male who presented for follow up of their knee. The patient is being followed for their left knee pain and osteoarthritis. They are now month(s) out from Euflexxa series. Symptoms reported include: pain, swelling, aching, stiffness, catching and difficulty ambulating. The patient feels that they are doing poorly and report their pain level to be moderate to severe. Current treatment includes: bracing. The following medication has been used for pain control: Tylenol. The patient has not gotten any relief of their symptoms with Cortisone injections or viscosupplementation. He feels like the left knee is getting progressively worse. The Euflexxa did not help at all. He is having progressive pain and swelling in the left knee. Even starting to get some pain in the right knee at times. The left is what bothers him the most. He feels as though the knee is getting much worse at this time. He states that it is bothering him at all times. His knees starting to get more unstable on him. He has reached a point where he would like to get the knee replaced at thtis time.  They have been treated conservatively in the past for the above stated problem and despite conservative measures, they continue to have progressive pain and severe functional limitations and dysfunction. They have failed non-operative management including home exercise, medications, and injections. It is felt that they would benefit from undergoing total joint replacement. Risks and benefits of the procedure have been  discussed with the patient and they elect to proceed with surgery. There are no active contraindications to surgery such as ongoing infection or rapidly progressive neurological disease.  Problem List/Past Medical  Primary osteoarthritis of left knee (M17.12)  Toe amputation status, right (I43.329)  Neuropathic ulcer of toe of right foot, limited to breakdown of skin (L97.511)  Hypercholesterolemia  High blood pressure  Cancer  Right Drop Foot  Peripheral Neuropathy   Allergies OxyCODONE HCl *ANALGESICS - OPIOID*  Itching.  Family History Hypertension  mother Diabetes Mellitus  father and sister Cancer  father  Social History Number of flights of stairs before winded  greater than 5 Marital status  married Living situation  live alone Tobacco use  never smoker Tobacco / smoke exposure  yes outdoors only Pain Contract  no Illicit drug use  no Current work status  disabled Children  1 Alcohol use  current drinker; drinks beer, wine and hard liquor; only occasionally per week Exercise  Exercises rarely; does running / walking Drug/Alcohol Rehab (Previously)  no Drug/Alcohol Rehab (Currently)  no  Medication History  Lisinopril (10MG  Tablet, Oral) Active. Vitamin B-6 Active. Atorvastatin Calcium (40MG  Tablet, Oral) Active. AmLODIPine Besylate (10MG  Tablet, Oral) Active. Gabapentin (300MG  Capsule, Oral) Active. Penicillin V Potassium (500MG  Tablet, Oral) Active.  Past Surgical History Arthroscopy of Knee  right Foot Surgery  right Status post amputation of great toe, right (Z89.411)   Review of Systems General Not Present- Chills, Fatigue, Fever, Memory Loss, Night Sweats, Weight Gain and Weight Loss. Skin Not Present- Eczema, Hives, Itching, Lesions and Rash. HEENT Not Present- Dentures, Double Vision,  Headache, Hearing Loss, Tinnitus and Visual Loss. Respiratory Not Present- Allergies, Chronic Cough, Coughing up blood, Shortness of  breath at rest and Shortness of breath with exertion. Cardiovascular Not Present- Chest Pain, Difficulty Breathing Lying Down, Murmur, Palpitations, Racing/skipping heartbeats and Swelling. Gastrointestinal Not Present- Abdominal Pain, Bloody Stool, Constipation, Diarrhea, Difficulty Swallowing, Heartburn, Jaundice, Loss of appetitie, Nausea and Vomiting. Male Genitourinary Not Present- Blood in Urine, Discharge, Flank Pain, Incontinence, Painful Urination, Urgency, Urinary frequency, Urinary Retention, Urinating at Night and Weak urinary stream. Musculoskeletal Present- Joint Pain. Not Present- Back Pain, Joint Swelling, Morning Stiffness, Muscle Pain, Muscle Weakness and Spasms. Neurological Not Present- Blackout spells, Difficulty with balance, Dizziness, Paralysis, Tremor and Weakness. Psychiatric Not Present- Insomnia.  Vitals Weight: 215 lb Height: 72in Weight was reported by patient. Height was reported by patient. Body Surface Area: 2.2 m Body Mass Index: 29.16 kg/m  Pulse: 64 (Regular)  BP: 132/78 (Sitting, Left Arm, Standard)  Physical Exam  General Mental Status -Alert, cooperative and good historian. General Appearance-pleasant, Not in acute distress. Orientation-Oriented X3. Build & Nutrition-Well nourished and Well developed.  Head and Neck Head-normocephalic, atraumatic . Neck Global Assessment - supple, no bruit auscultated on the right, no bruit auscultated on the left.  Eye Pupil - Bilateral-Regular and Round. Motion - Bilateral-EOMI.  Chest and Lung Exam Auscultation Breath sounds - clear at anterior chest wall and clear at posterior chest wall. Adventitious sounds - No Adventitious sounds.  Cardiovascular Auscultation Rhythm - Regular rate and rhythm. Heart Sounds - S1 WNL and S2 WNL. Murmurs & Other Heart Sounds - Auscultation of the heart reveals - No Murmurs.  Abdomen Palpation/Percussion Tenderness - Abdomen is non-tender to  palpation. Rigidity (guarding) - Abdomen is soft. Auscultation Auscultation of the abdomen reveals - Bowel sounds normal.  Male Genitourinary Note: Not done, not pertinent to present illness   Musculoskeletal Note: He is alert and oriented, no apparent distress. His left knee shows no effusion. There is a large valgus deformity of the knee. His range is 5 to 130. There is no instability.  RADIOGRAPHS His radiographs do show severe bone on bone arthritis in the lateral compartment with about a 20-degree valgus deformity.  Assessment & Plan  Primary osteoarthritis of left knee (M17.12)  Note:Surgical Plans: Left Total Knee Replacement  Disposition: Home with Family, Start with HHPT and then progress to outpatient therapy  Patient has been seen preoperatively by his primary office and felt to be stable for surgery.  Topical TXA  Anesthesia Issues: None  Patient was instructed on what medications to stop prior to surgery.  Signed electronically by Joelene Millin, III PA-C

## 2017-01-30 ENCOUNTER — Inpatient Hospital Stay (HOSPITAL_COMMUNITY): Payer: Medicare PPO | Admitting: Anesthesiology

## 2017-01-30 ENCOUNTER — Encounter (HOSPITAL_COMMUNITY): Payer: Self-pay

## 2017-01-30 ENCOUNTER — Encounter (HOSPITAL_COMMUNITY)
Admission: RE | Disposition: A | Discharge status: Home Health | Payer: Self-pay | Source: Ambulatory Visit | Attending: Orthopedic Surgery

## 2017-01-30 ENCOUNTER — Inpatient Hospital Stay (HOSPITAL_COMMUNITY)
Admission: RE | Admit: 2017-01-30 | Discharge: 2017-02-01 | DRG: 470 | Disposition: A | Discharge status: Home Health | Payer: Medicare PPO | Source: Ambulatory Visit | Attending: Orthopedic Surgery | Admitting: Orthopedic Surgery

## 2017-01-30 DIAGNOSIS — I129 Hypertensive chronic kidney disease with stage 1 through stage 4 chronic kidney disease, or unspecified chronic kidney disease: Secondary | ICD-10-CM | POA: Diagnosis present

## 2017-01-30 DIAGNOSIS — M21062 Valgus deformity, not elsewhere classified, left knee: Secondary | ICD-10-CM | POA: Diagnosis present

## 2017-01-30 DIAGNOSIS — M1712 Unilateral primary osteoarthritis, left knee: Secondary | ICD-10-CM | POA: Diagnosis present

## 2017-01-30 DIAGNOSIS — R110 Nausea: Secondary | ICD-10-CM | POA: Diagnosis not present

## 2017-01-30 DIAGNOSIS — Z9221 Personal history of antineoplastic chemotherapy: Secondary | ICD-10-CM

## 2017-01-30 DIAGNOSIS — Z89411 Acquired absence of right great toe: Secondary | ICD-10-CM | POA: Diagnosis not present

## 2017-01-30 DIAGNOSIS — E11621 Type 2 diabetes mellitus with foot ulcer: Secondary | ICD-10-CM | POA: Diagnosis present

## 2017-01-30 DIAGNOSIS — L97509 Non-pressure chronic ulcer of other part of unspecified foot with unspecified severity: Secondary | ICD-10-CM | POA: Diagnosis present

## 2017-01-30 DIAGNOSIS — E7800 Pure hypercholesterolemia, unspecified: Secondary | ICD-10-CM | POA: Diagnosis present

## 2017-01-30 DIAGNOSIS — I2510 Atherosclerotic heart disease of native coronary artery without angina pectoris: Secondary | ICD-10-CM | POA: Diagnosis present

## 2017-01-30 DIAGNOSIS — Z9081 Acquired absence of spleen: Secondary | ICD-10-CM

## 2017-01-30 DIAGNOSIS — I4891 Unspecified atrial fibrillation: Secondary | ICD-10-CM | POA: Diagnosis present

## 2017-01-30 DIAGNOSIS — Z79899 Other long term (current) drug therapy: Secondary | ICD-10-CM | POA: Diagnosis not present

## 2017-01-30 DIAGNOSIS — Z8249 Family history of ischemic heart disease and other diseases of the circulatory system: Secondary | ICD-10-CM | POA: Diagnosis not present

## 2017-01-30 DIAGNOSIS — Z8572 Personal history of non-Hodgkin lymphomas: Secondary | ICD-10-CM | POA: Diagnosis not present

## 2017-01-30 DIAGNOSIS — Z9484 Stem cells transplant status: Secondary | ICD-10-CM

## 2017-01-30 DIAGNOSIS — M179 Osteoarthritis of knee, unspecified: Secondary | ICD-10-CM | POA: Diagnosis present

## 2017-01-30 DIAGNOSIS — E1122 Type 2 diabetes mellitus with diabetic chronic kidney disease: Secondary | ICD-10-CM | POA: Diagnosis present

## 2017-01-30 DIAGNOSIS — I4892 Unspecified atrial flutter: Secondary | ICD-10-CM | POA: Diagnosis present

## 2017-01-30 DIAGNOSIS — M25762 Osteophyte, left knee: Secondary | ICD-10-CM | POA: Diagnosis present

## 2017-01-30 DIAGNOSIS — Z885 Allergy status to narcotic agent status: Secondary | ICD-10-CM | POA: Diagnosis not present

## 2017-01-30 DIAGNOSIS — Z833 Family history of diabetes mellitus: Secondary | ICD-10-CM | POA: Diagnosis not present

## 2017-01-30 DIAGNOSIS — N183 Chronic kidney disease, stage 3 (moderate): Secondary | ICD-10-CM | POA: Diagnosis present

## 2017-01-30 DIAGNOSIS — E785 Hyperlipidemia, unspecified: Secondary | ICD-10-CM | POA: Diagnosis present

## 2017-01-30 DIAGNOSIS — G629 Polyneuropathy, unspecified: Secondary | ICD-10-CM | POA: Diagnosis present

## 2017-01-30 DIAGNOSIS — M1710 Unilateral primary osteoarthritis, unspecified knee: Secondary | ICD-10-CM | POA: Diagnosis present

## 2017-01-30 HISTORY — PX: TOTAL KNEE ARTHROPLASTY: SHX125

## 2017-01-30 LAB — TYPE AND SCREEN
ABO/RH(D): A POS
Antibody Screen: NEGATIVE

## 2017-01-30 LAB — GLUCOSE, CAPILLARY
GLUCOSE-CAPILLARY: 148 mg/dL — AB (ref 65–99)
GLUCOSE-CAPILLARY: 267 mg/dL — AB (ref 65–99)
Glucose-Capillary: 75 mg/dL (ref 65–99)
Glucose-Capillary: 102 mg/dL — ABNORMAL HIGH (ref 65–99)
Glucose-Capillary: 150 mg/dL — ABNORMAL HIGH (ref 65–99)

## 2017-01-30 SURGERY — ARTHROPLASTY, KNEE, TOTAL
Anesthesia: Monitor Anesthesia Care | Site: Knee | Laterality: Left

## 2017-01-30 MED ORDER — ONDANSETRON HCL 4 MG/2ML IJ SOLN: 4.0000 mg | Freq: Once | INTRAMUSCULAR | Status: DC | PRN

## 2017-01-30 MED ORDER — PROPOFOL 10 MG/ML IV BOLUS
INTRAVENOUS | Status: AC
  Filled 2017-01-30: qty 20

## 2017-01-30 MED ORDER — BISACODYL 10 MG RE SUPP: 10.0000 mg | Freq: Every day | RECTAL | Status: DC | PRN

## 2017-01-30 MED ORDER — ACETAMINOPHEN 10 MG/ML IV SOLN
1000.0000 mg | Freq: Once | INTRAVENOUS | Status: AC
  Administered 2017-01-30: 1000 mg via INTRAVENOUS

## 2017-01-30 MED ORDER — SODIUM CHLORIDE 0.9 % IR SOLN
Status: DC | PRN
  Administered 2017-01-30: 1000 mL

## 2017-01-30 MED ORDER — DIPHENHYDRAMINE HCL 12.5 MG/5ML PO ELIX: 12.5000 mg | ORAL_SOLUTION | ORAL | Status: DC | PRN

## 2017-01-30 MED ORDER — ACETAMINOPHEN 10 MG/ML IV SOLN
INTRAVENOUS | Status: AC
  Filled 2017-01-30: qty 100

## 2017-01-30 MED ORDER — SODIUM CHLORIDE 0.9 % IJ SOLN
INTRAMUSCULAR | Status: AC
  Filled 2017-01-30: qty 50

## 2017-01-30 MED ORDER — MIDAZOLAM HCL 5 MG/ML IJ SOLN
2.0000 mg | Freq: Once | INTRAMUSCULAR | Status: AC
  Administered 2017-01-30: 2 mg via INTRAVENOUS

## 2017-01-30 MED ORDER — CEFAZOLIN SODIUM-DEXTROSE 2-4 GM/100ML-% IV SOLN
2.0000 g | Freq: Four times a day (QID) | INTRAVENOUS | Status: AC
  Administered 2017-01-30 (×2): 2 g via INTRAVENOUS
  Filled 2017-01-30 (×2): qty 100

## 2017-01-30 MED ORDER — FLEET ENEMA 7-19 GM/118ML RE ENEM: 1.0000 | ENEMA | Freq: Once | RECTAL | Status: DC | PRN

## 2017-01-30 MED ORDER — MEPERIDINE HCL 50 MG/ML IJ SOLN: 6.2500 mg | INTRAMUSCULAR | Status: DC | PRN

## 2017-01-30 MED ORDER — TRANEXAMIC ACID 1000 MG/10ML IV SOLN
2000.0000 mg | Freq: Once | INTRAVENOUS | Status: DC
  Filled 2017-01-30: qty 20

## 2017-01-30 MED ORDER — DEXAMETHASONE SODIUM PHOSPHATE 10 MG/ML IJ SOLN
10.0000 mg | Freq: Once | INTRAMUSCULAR | Status: AC
  Administered 2017-01-31: 10 mg via INTRAVENOUS
  Filled 2017-01-30: qty 1

## 2017-01-30 MED ORDER — PHENOL 1.4 % MT LIQD: 1.0000 | OROMUCOSAL | Status: DC | PRN

## 2017-01-30 MED ORDER — GLYCOPYRROLATE 0.2 MG/ML IV SOSY
PREFILLED_SYRINGE | INTRAVENOUS | Status: AC
Start: 2017-01-30 — End: 2017-01-30
  Filled 2017-01-30: qty 5

## 2017-01-30 MED ORDER — PROPOFOL 10 MG/ML IV BOLUS
INTRAVENOUS | Status: AC
  Filled 2017-01-30: qty 40

## 2017-01-30 MED ORDER — BUPIVACAINE LIPOSOME 1.3 % IJ SUSP
INTRAMUSCULAR | Status: DC | PRN
  Administered 2017-01-30: 20 mL

## 2017-01-30 MED ORDER — RIVAROXABAN 10 MG PO TABS
10.0000 mg | ORAL_TABLET | Freq: Every day | ORAL | Status: DC
  Administered 2017-01-31 – 2017-02-01 (×2): 10 mg via ORAL
  Filled 2017-01-30 (×2): qty 1

## 2017-01-30 MED ORDER — ONDANSETRON HCL 4 MG/2ML IJ SOLN: 4.0000 mg | Freq: Four times a day (QID) | INTRAMUSCULAR | Status: DC | PRN

## 2017-01-30 MED ORDER — CEFAZOLIN SODIUM-DEXTROSE 2-4 GM/100ML-% IV SOLN
INTRAVENOUS | Status: AC
  Filled 2017-01-30: qty 100

## 2017-01-30 MED ORDER — FENTANYL CITRATE (PF) 100 MCG/2ML IJ SOLN
INTRAMUSCULAR | Status: DC | PRN
  Administered 2017-01-30: 50 ug via INTRAVENOUS

## 2017-01-30 MED ORDER — FENTANYL CITRATE (PF) 100 MCG/2ML IJ SOLN
100.0000 ug | Freq: Once | INTRAMUSCULAR | Status: AC
  Administered 2017-01-30: 100 ug via INTRAVENOUS

## 2017-01-30 MED ORDER — INSULIN ASPART 100 UNIT/ML ~~LOC~~ SOLN: 0.0000 [IU] | Freq: Every day | SUBCUTANEOUS | Status: DC

## 2017-01-30 MED ORDER — HYDROMORPHONE HCL 1 MG/ML IJ SOLN
0.5000 mg | INTRAMUSCULAR | Status: DC | PRN
  Administered 2017-01-30 (×4): 0.5 mg via INTRAVENOUS
  Filled 2017-01-30 (×4): qty 0.5

## 2017-01-30 MED ORDER — MIDAZOLAM HCL 5 MG/5ML IJ SOLN
INTRAMUSCULAR | Status: DC | PRN
  Administered 2017-01-30 (×2): 1 mg via INTRAVENOUS

## 2017-01-30 MED ORDER — FENTANYL CITRATE (PF) 100 MCG/2ML IJ SOLN
INTRAMUSCULAR | Status: AC
  Administered 2017-01-30: 100 ug via INTRAVENOUS
  Filled 2017-01-30: qty 2

## 2017-01-30 MED ORDER — POLYETHYLENE GLYCOL 3350 17 G PO PACK: 17.0000 g | PACK | Freq: Every day | ORAL | Status: DC | PRN

## 2017-01-30 MED ORDER — INSULIN ASPART 100 UNIT/ML ~~LOC~~ SOLN
0.0000 [IU] | Freq: Three times a day (TID) | SUBCUTANEOUS | Status: DC
  Administered 2017-01-31: 08:00:00 2 [IU] via SUBCUTANEOUS

## 2017-01-30 MED ORDER — LACTATED RINGERS IV SOLN
INTRAVENOUS | Status: DC
  Administered 2017-01-30: 1000 mL via INTRAVENOUS
  Administered 2017-01-30: 10:00:00 via INTRAVENOUS

## 2017-01-30 MED ORDER — HYDROMORPHONE HCL 2 MG PO TABS
2.0000 mg | ORAL_TABLET | ORAL | Status: DC | PRN
  Administered 2017-01-30: 19:00:00 2 mg via ORAL
  Administered 2017-01-31: 4 mg via ORAL
  Administered 2017-01-31: 19:00:00 2 mg via ORAL
  Administered 2017-01-31 – 2017-02-01 (×7): 4 mg via ORAL
  Filled 2017-01-30 (×2): qty 2
  Filled 2017-01-30: qty 1
  Filled 2017-01-30 (×7): qty 2

## 2017-01-30 MED ORDER — MIDAZOLAM HCL 2 MG/2ML IJ SOLN
INTRAMUSCULAR | Status: AC
  Filled 2017-01-30: qty 2

## 2017-01-30 MED ORDER — EPHEDRINE 5 MG/ML INJ
INTRAVENOUS | Status: AC
  Filled 2017-01-30: qty 10

## 2017-01-30 MED ORDER — SODIUM CHLORIDE 0.9 % IJ SOLN
INTRAMUSCULAR | Status: DC | PRN
  Administered 2017-01-30: 50 mL
  Administered 2017-01-30: 10 mL

## 2017-01-30 MED ORDER — TRAMADOL HCL 50 MG PO TABS
50.0000 mg | ORAL_TABLET | Freq: Four times a day (QID) | ORAL | Status: DC | PRN
  Administered 2017-01-31: 18:00:00 50 mg via ORAL
  Administered 2017-01-31 – 2017-02-01 (×2): 100 mg via ORAL
  Filled 2017-01-30 (×3): qty 2

## 2017-01-30 MED ORDER — EPHEDRINE SULFATE-NACL 50-0.9 MG/10ML-% IV SOSY
PREFILLED_SYRINGE | INTRAVENOUS | Status: DC | PRN
  Administered 2017-01-30 (×2): 5 mg via INTRAVENOUS

## 2017-01-30 MED ORDER — BUPIVACAINE HCL (PF) 0.75 % IJ SOLN
INTRAMUSCULAR | Status: DC | PRN
  Administered 2017-01-30: 15 mg via INTRATHECAL

## 2017-01-30 MED ORDER — ROPIVACAINE HCL 5 MG/ML IJ SOLN
INTRAMUSCULAR | Status: DC | PRN
  Administered 2017-01-30: 25 mL via PERINEURAL

## 2017-01-30 MED ORDER — CEFAZOLIN SODIUM-DEXTROSE 2-4 GM/100ML-% IV SOLN
2.0000 g | INTRAVENOUS | Status: AC
  Administered 2017-01-30: 2 g via INTRAVENOUS

## 2017-01-30 MED ORDER — METOCLOPRAMIDE HCL 5 MG/ML IJ SOLN: 5.0000 mg | Freq: Three times a day (TID) | INTRAMUSCULAR | Status: DC | PRN

## 2017-01-30 MED ORDER — TRANEXAMIC ACID 1000 MG/10ML IV SOLN
INTRAVENOUS | Status: AC | PRN
  Administered 2017-01-30: 2000 mg via TOPICAL

## 2017-01-30 MED ORDER — GLYCOPYRROLATE 0.2 MG/ML IV SOSY
PREFILLED_SYRINGE | INTRAVENOUS | Status: DC | PRN
Start: 2017-01-30 — End: 2017-01-30
  Administered 2017-01-30: 0.4 mg via INTRAVENOUS

## 2017-01-30 MED ORDER — PENICILLIN V POTASSIUM 250 MG PO TABS
500.0000 mg | ORAL_TABLET | Freq: Two times a day (BID) | ORAL | Status: DC
  Administered 2017-01-31 – 2017-02-01 (×3): 500 mg via ORAL
  Filled 2017-01-30 (×3): qty 2

## 2017-01-30 MED ORDER — METHOCARBAMOL 500 MG PO TABS
500.0000 mg | ORAL_TABLET | Freq: Four times a day (QID) | ORAL | Status: DC | PRN
  Administered 2017-01-31 – 2017-02-01 (×5): 500 mg via ORAL
  Filled 2017-01-30 (×5): qty 1

## 2017-01-30 MED ORDER — MENTHOL 3 MG MT LOZG: 1.0000 | LOZENGE | OROMUCOSAL | Status: DC | PRN

## 2017-01-30 MED ORDER — BUPIVACAINE LIPOSOME 1.3 % IJ SUSP
20.0000 mL | Freq: Once | INTRAMUSCULAR | Status: DC
  Filled 2017-01-30: qty 20

## 2017-01-30 MED ORDER — PROPOFOL 500 MG/50ML IV EMUL
INTRAVENOUS | Status: DC | PRN
  Administered 2017-01-30: 100 ug/kg/min via INTRAVENOUS

## 2017-01-30 MED ORDER — ACETAMINOPHEN 650 MG RE SUPP: 650.0000 mg | Freq: Four times a day (QID) | RECTAL | Status: DC | PRN

## 2017-01-30 MED ORDER — METOCLOPRAMIDE HCL 5 MG PO TABS: 5.0000 mg | ORAL_TABLET | Freq: Three times a day (TID) | ORAL | Status: DC | PRN

## 2017-01-30 MED ORDER — GABAPENTIN 300 MG PO CAPS
600.0000 mg | ORAL_CAPSULE | Freq: Three times a day (TID) | ORAL | Status: DC
  Administered 2017-01-30 – 2017-02-01 (×6): 600 mg via ORAL
  Filled 2017-01-30 (×7): qty 2

## 2017-01-30 MED ORDER — DEXAMETHASONE SODIUM PHOSPHATE 10 MG/ML IJ SOLN
INTRAMUSCULAR | Status: AC
  Filled 2017-01-30: qty 1

## 2017-01-30 MED ORDER — ATORVASTATIN CALCIUM 40 MG PO TABS
40.0000 mg | ORAL_TABLET | Freq: Every day | ORAL | Status: DC
  Administered 2017-01-31 – 2017-02-01 (×2): 40 mg via ORAL
  Filled 2017-01-30 (×2): qty 1

## 2017-01-30 MED ORDER — FENTANYL CITRATE (PF) 100 MCG/2ML IJ SOLN: 25.0000 ug | INTRAMUSCULAR | Status: DC | PRN

## 2017-01-30 MED ORDER — DOCUSATE SODIUM 100 MG PO CAPS
100.0000 mg | ORAL_CAPSULE | Freq: Two times a day (BID) | ORAL | Status: DC
  Administered 2017-01-30 – 2017-02-01 (×4): 100 mg via ORAL
  Filled 2017-01-30 (×4): qty 1

## 2017-01-30 MED ORDER — ACETAMINOPHEN 500 MG PO TABS
1000.0000 mg | ORAL_TABLET | Freq: Four times a day (QID) | ORAL | Status: AC
  Administered 2017-01-30 – 2017-01-31 (×4): 1000 mg via ORAL
  Filled 2017-01-30 (×4): qty 2

## 2017-01-30 MED ORDER — GABAPENTIN 300 MG PO CAPS
ORAL_CAPSULE | ORAL | Status: AC
  Filled 2017-01-30: qty 1

## 2017-01-30 MED ORDER — SODIUM CHLORIDE 0.9 % IV SOLN
INTRAVENOUS | Status: DC
  Administered 2017-01-30 – 2017-01-31 (×2): via INTRAVENOUS

## 2017-01-30 MED ORDER — CHLORHEXIDINE GLUCONATE 4 % EX LIQD: 60.0000 mL | Freq: Once | CUTANEOUS | Status: DC

## 2017-01-30 MED ORDER — ONDANSETRON HCL 4 MG PO TABS: 4.0000 mg | ORAL_TABLET | Freq: Four times a day (QID) | ORAL | Status: DC | PRN

## 2017-01-30 MED ORDER — GABAPENTIN 300 MG PO CAPS: 300.0000 mg | ORAL_CAPSULE | Freq: Once | ORAL | Status: DC

## 2017-01-30 MED ORDER — FENTANYL CITRATE (PF) 100 MCG/2ML IJ SOLN
INTRAMUSCULAR | Status: AC
  Filled 2017-01-30: qty 2

## 2017-01-30 MED ORDER — DEXAMETHASONE SODIUM PHOSPHATE 10 MG/ML IJ SOLN
10.0000 mg | Freq: Once | INTRAMUSCULAR | Status: AC
  Administered 2017-01-30: 10 mg via INTRAVENOUS

## 2017-01-30 MED ORDER — ACETAMINOPHEN 325 MG PO TABS
650.0000 mg | ORAL_TABLET | Freq: Four times a day (QID) | ORAL | Status: DC | PRN
  Administered 2017-01-31: 18:00:00 650 mg via ORAL
  Filled 2017-01-30 (×2): qty 2

## 2017-01-30 MED ORDER — AMLODIPINE BESYLATE 10 MG PO TABS
10.0000 mg | ORAL_TABLET | Freq: Every morning | ORAL | Status: DC
  Administered 2017-01-31 – 2017-02-01 (×2): 10 mg via ORAL
  Filled 2017-01-30 (×2): qty 1

## 2017-01-30 MED ORDER — SODIUM CHLORIDE 0.9 % IJ SOLN
INTRAMUSCULAR | Status: AC
  Filled 2017-01-30: qty 10

## 2017-01-30 MED ORDER — ONDANSETRON HCL 4 MG/2ML IJ SOLN
INTRAMUSCULAR | Status: AC
  Filled 2017-01-30: qty 2

## 2017-01-30 MED ORDER — ONDANSETRON HCL 4 MG/2ML IJ SOLN
INTRAMUSCULAR | Status: DC | PRN
  Administered 2017-01-30: 4 mg via INTRAVENOUS

## 2017-01-30 MED ORDER — METHOCARBAMOL 1000 MG/10ML IJ SOLN
500.0000 mg | Freq: Four times a day (QID) | INTRAVENOUS | Status: DC | PRN
  Administered 2017-01-30 (×2): 500 mg via INTRAVENOUS
  Filled 2017-01-30 (×2): qty 550

## 2017-01-30 SURGICAL SUPPLY — 49 items
BAG DECANTER FOR FLEXI CONT (MISCELLANEOUS) ×3 IMPLANT
BAG ZIPLOCK 12X15 (MISCELLANEOUS) ×3 IMPLANT
BANDAGE ACE 6X5 VEL STRL LF (GAUZE/BANDAGES/DRESSINGS) ×3 IMPLANT
BLADE SAG 18X100X1.27 (BLADE) ×3 IMPLANT
BLADE SAW SGTL 11.0X1.19X90.0M (BLADE) ×3 IMPLANT
BOWL SMART MIX CTS (DISPOSABLE) ×3 IMPLANT
CAP KNEE TOTAL 3 SIGMA ×3 IMPLANT
CEMENT HV SMART SET (Cement) ×6 IMPLANT
CLOSURE WOUND 1/2 X4 (GAUZE/BANDAGES/DRESSINGS) ×1
COVER SURGICAL LIGHT HANDLE (MISCELLANEOUS) ×3 IMPLANT
CUFF TOURN SGL QUICK 34 (TOURNIQUET CUFF) ×2
CUFF TRNQT CYL 34X4X40X1 (TOURNIQUET CUFF) ×1 IMPLANT
DECANTER SPIKE VIAL GLASS SM (MISCELLANEOUS) ×3 IMPLANT
DRAPE U-SHAPE 47X51 STRL (DRAPES) ×3 IMPLANT
DRSG ADAPTIC 3X8 NADH LF (GAUZE/BANDAGES/DRESSINGS) ×3 IMPLANT
DRSG PAD ABDOMINAL 8X10 ST (GAUZE/BANDAGES/DRESSINGS) ×3 IMPLANT
DURAPREP 26ML APPLICATOR (WOUND CARE) ×3 IMPLANT
ELECT REM PT RETURN 15FT ADLT (MISCELLANEOUS) ×3 IMPLANT
EVACUATOR 1/8 PVC DRAIN (DRAIN) ×3 IMPLANT
GAUZE SPONGE 4X4 12PLY STRL (GAUZE/BANDAGES/DRESSINGS) ×3 IMPLANT
GLOVE BIO SURGEON STRL SZ7.5 (GLOVE) IMPLANT
GLOVE BIO SURGEON STRL SZ8 (GLOVE) ×3 IMPLANT
GLOVE BIOGEL PI IND STRL 6.5 (GLOVE) ×6 IMPLANT
GLOVE BIOGEL PI IND STRL 8 (GLOVE) ×1 IMPLANT
GLOVE BIOGEL PI INDICATOR 6.5 (GLOVE) ×12
GLOVE BIOGEL PI INDICATOR 8 (GLOVE) ×2
GLOVE SURG SS PI 6.5 STRL IVOR (GLOVE) IMPLANT
GOWN STRL REUS W/TWL LRG LVL3 (GOWN DISPOSABLE) ×6 IMPLANT
GOWN STRL REUS W/TWL XL LVL3 (GOWN DISPOSABLE) ×6 IMPLANT
HANDPIECE INTERPULSE COAX TIP (DISPOSABLE) ×2
IMMOBILIZER KNEE 20 (SOFTGOODS) ×3
IMMOBILIZER KNEE 20 THIGH 36 (SOFTGOODS) ×1 IMPLANT
MANIFOLD NEPTUNE II (INSTRUMENTS) ×3 IMPLANT
NS IRRIG 1000ML POUR BTL (IV SOLUTION) ×3 IMPLANT
PACK TOTAL KNEE CUSTOM (KITS) ×3 IMPLANT
PADDING CAST COTTON 6X4 STRL (CAST SUPPLIES) ×3 IMPLANT
POSITIONER SURGICAL ARM (MISCELLANEOUS) ×3 IMPLANT
SET HNDPC FAN SPRY TIP SCT (DISPOSABLE) ×1 IMPLANT
STRIP CLOSURE SKIN 1/2X4 (GAUZE/BANDAGES/DRESSINGS) ×2 IMPLANT
SUT MNCRL AB 4-0 PS2 18 (SUTURE) ×3 IMPLANT
SUT STRATAFIX 0 PDS 27 VIOLET (SUTURE) ×3
SUT VIC AB 2-0 CT1 27 (SUTURE) ×6
SUT VIC AB 2-0 CT1 TAPERPNT 27 (SUTURE) ×3 IMPLANT
SUTURE STRATFX 0 PDS 27 VIOLET (SUTURE) ×1 IMPLANT
SYR 30ML LL (SYRINGE) ×6 IMPLANT
TRAY FOLEY W/METER SILVER 16FR (SET/KITS/TRAYS/PACK) ×3 IMPLANT
WATER STERILE IRR 1000ML POUR (IV SOLUTION) ×6 IMPLANT
WRAP KNEE MAXI GEL POST OP (GAUZE/BANDAGES/DRESSINGS) ×3 IMPLANT
YANKAUER SUCT BULB TIP 10FT TU (MISCELLANEOUS) ×3 IMPLANT

## 2017-01-30 NOTE — Anesthesia Procedure Notes (Signed)
Anesthesia Regional Block: Adductor canal block   Pre-Anesthetic Checklist: ,, timeout performed, Correct Patient, Correct Site, Correct Laterality, Correct Procedure, Correct Position, site marked, Risks and benefits discussed,  Surgical consent,  Pre-op evaluation,  At surgeon's request and post-op pain management  Laterality: Left  Prep: Dura Prep       Needles:  Injection technique: Single-shot  Needle Type: Stimiplex     Needle Length: 9cm  Needle Gauge: 21     Additional Needles:   Procedures: ultrasound guided,,,,,,,,  Narrative:  Start time: 01/30/2017 8:10 AM End time: 01/30/2017 8:19 AM Injection made incrementally with aspirations every 5 mL.  Performed by: Personally  Anesthesiologist: Zaylynn Rickett

## 2017-01-30 NOTE — Progress Notes (Signed)
Care reassumed at this time.

## 2017-01-30 NOTE — H&P (View-Only) (Signed)
Cory Marks DOB: 1961/05/15 Single / Language: Cleophus Molt / Race: Black or African American Male Date of Admission: 01/30/2017 CC:  Left Knee Pain History of Present Illness The patient is a 56 year old male who comes in for a preoperative History and Physical. The patient is scheduled for a left total knee arthroplasty to be performed by Dr. Dione Plover. Aluisio, MD at [facility] on 01-30-2017. The patient is a 56 year old male who presented for follow up of their knee. The patient is being followed for their left knee pain and osteoarthritis. They are now month(s) out from Euflexxa series. Symptoms reported include: pain, swelling, aching, stiffness, catching and difficulty ambulating. The patient feels that they are doing poorly and report their pain level to be moderate to severe. Current treatment includes: bracing. The following medication has been used for pain control: Tylenol. The patient has not gotten any relief of their symptoms with Cortisone injections or viscosupplementation. He feels like the left knee is getting progressively worse. The Euflexxa did not help at all. He is having progressive pain and swelling in the left knee. Even starting to get some pain in the right knee at times. The left is what bothers him the most. He feels as though the knee is getting much worse at this time. He states that it is bothering him at all times. His knees starting to get more unstable on him. He has reached a point where he would like to get the knee replaced at thtis time.  They have been treated conservatively in the past for the above stated problem and despite conservative measures, they continue to have progressive pain and severe functional limitations and dysfunction. They have failed non-operative management including home exercise, medications, and injections. It is felt that they would benefit from undergoing total joint replacement. Risks and benefits of the procedure have been  discussed with the patient and they elect to proceed with surgery. There are no active contraindications to surgery such as ongoing infection or rapidly progressive neurological disease.  Problem List/Past Medical  Primary osteoarthritis of left knee (M17.12)  Toe amputation status, right (M35.361)  Neuropathic ulcer of toe of right foot, limited to breakdown of skin (L97.511)  Hypercholesterolemia  High blood pressure  Cancer  Right Drop Foot  Peripheral Neuropathy   Allergies OxyCODONE HCl *ANALGESICS - OPIOID*  Itching.  Family History Hypertension  mother Diabetes Mellitus  father and sister Cancer  father  Social History Number of flights of stairs before winded  greater than 5 Marital status  married Living situation  live alone Tobacco use  never smoker Tobacco / smoke exposure  yes outdoors only Pain Contract  no Illicit drug use  no Current work status  disabled Children  1 Alcohol use  current drinker; drinks beer, wine and hard liquor; only occasionally per week Exercise  Exercises rarely; does running / walking Drug/Alcohol Rehab (Previously)  no Drug/Alcohol Rehab (Currently)  no  Medication History  Lisinopril (10MG  Tablet, Oral) Active. Vitamin B-6 Active. Atorvastatin Calcium (40MG  Tablet, Oral) Active. AmLODIPine Besylate (10MG  Tablet, Oral) Active. Gabapentin (300MG  Capsule, Oral) Active. Penicillin V Potassium (500MG  Tablet, Oral) Active.  Past Surgical History Arthroscopy of Knee  right Foot Surgery  right Status post amputation of great toe, right (Z89.411)   Review of Systems General Not Present- Chills, Fatigue, Fever, Memory Loss, Night Sweats, Weight Gain and Weight Loss. Skin Not Present- Eczema, Hives, Itching, Lesions and Rash. HEENT Not Present- Dentures, Double Vision,  Headache, Hearing Loss, Tinnitus and Visual Loss. Respiratory Not Present- Allergies, Chronic Cough, Coughing up blood, Shortness of  breath at rest and Shortness of breath with exertion. Cardiovascular Not Present- Chest Pain, Difficulty Breathing Lying Down, Murmur, Palpitations, Racing/skipping heartbeats and Swelling. Gastrointestinal Not Present- Abdominal Pain, Bloody Stool, Constipation, Diarrhea, Difficulty Swallowing, Heartburn, Jaundice, Loss of appetitie, Nausea and Vomiting. Male Genitourinary Not Present- Blood in Urine, Discharge, Flank Pain, Incontinence, Painful Urination, Urgency, Urinary frequency, Urinary Retention, Urinating at Night and Weak urinary stream. Musculoskeletal Present- Joint Pain. Not Present- Back Pain, Joint Swelling, Morning Stiffness, Muscle Pain, Muscle Weakness and Spasms. Neurological Not Present- Blackout spells, Difficulty with balance, Dizziness, Paralysis, Tremor and Weakness. Psychiatric Not Present- Insomnia.  Vitals Weight: 215 lb Height: 72in Weight was reported by patient. Height was reported by patient. Body Surface Area: 2.2 m Body Mass Index: 29.16 kg/m  Pulse: 64 (Regular)  BP: 132/78 (Sitting, Left Arm, Standard)  Physical Exam  General Mental Status -Alert, cooperative and good historian. General Appearance-pleasant, Not in acute distress. Orientation-Oriented X3. Build & Nutrition-Well nourished and Well developed.  Head and Neck Head-normocephalic, atraumatic . Neck Global Assessment - supple, no bruit auscultated on the right, no bruit auscultated on the left.  Eye Pupil - Bilateral-Regular and Round. Motion - Bilateral-EOMI.  Chest and Lung Exam Auscultation Breath sounds - clear at anterior chest wall and clear at posterior chest wall. Adventitious sounds - No Adventitious sounds.  Cardiovascular Auscultation Rhythm - Regular rate and rhythm. Heart Sounds - S1 WNL and S2 WNL. Murmurs & Other Heart Sounds - Auscultation of the heart reveals - No Murmurs.  Abdomen Palpation/Percussion Tenderness - Abdomen is non-tender to  palpation. Rigidity (guarding) - Abdomen is soft. Auscultation Auscultation of the abdomen reveals - Bowel sounds normal.  Male Genitourinary Note: Not done, not pertinent to present illness   Musculoskeletal Note: He is alert and oriented, no apparent distress. His left knee shows no effusion. There is a large valgus deformity of the knee. His range is 5 to 130. There is no instability.  RADIOGRAPHS His radiographs do show severe bone on bone arthritis in the lateral compartment with about a 20-degree valgus deformity.  Assessment & Plan  Primary osteoarthritis of left knee (M17.12)  Note:Surgical Plans: Left Total Knee Replacement  Disposition: Home with Family, Start with HHPT and then progress to outpatient therapy  Patient has been seen preoperatively by his primary office and felt to be stable for surgery.  Topical TXA  Anesthesia Issues: None  Patient was instructed on what medications to stop prior to surgery.  Signed electronically by Joelene Millin, III PA-C

## 2017-01-30 NOTE — Progress Notes (Signed)
Orthopedic Tech Progress Note Patient Details:  Cory Marks 1961-01-27 [id number]  Placed Pt in CPM at 1405. Did not apply OHF w/ Trapeze as Pt is not on Ortho Bed. Notified RN. CPM Left Knee CPM Left Knee: On Left Knee Flexion (Degrees): 40 Left Knee Extension (Degrees): 10 Additional Comments: Increase 10 degrees/day, 4 hours/day   [name] 01/30/2017, 2:12 PM

## 2017-01-30 NOTE — Anesthesia Postprocedure Evaluation (Signed)
Anesthesia Post Note  Patient: Cory Marks  Procedure(s) Performed: Procedure(s) (LRB): LEFT TOTAL KNEE ARTHROPLASTY (Left)     Patient location during evaluation: PACU Anesthesia Type: Regional Level of consciousness: awake and alert Pain management: pain level controlled Vital Signs Assessment: post-procedure vital signs reviewed and stable Respiratory status: spontaneous breathing, nonlabored ventilation, respiratory function stable and patient connected to nasal cannula oxygen Cardiovascular status: blood pressure returned to baseline and stable Postop Assessment: no signs of nausea or vomiting Anesthetic complications: no    Last Vitals:  Vitals:   01/30/17 0900 01/30/17 1102  BP: 114/88 107/78  Pulse: (!) 48 65  Resp: 12 15  Temp:  37.1 C    Last Pain:  Vitals:   01/30/17 0733  TempSrc:   PainSc: 8             L Sensory Level: T11 (01/30/17 1102) R Sensory Level: T11 (01/30/17 1102)  Araeya Lamb

## 2017-01-30 NOTE — Progress Notes (Signed)
Report given to Michelle, RN at this time.

## 2017-01-30 NOTE — Anesthesia Procedure Notes (Signed)
Spinal  Patient location during procedure: OR Start time: 01/30/2017 9:23 AM End time: 01/30/2017 9:25 AM Staffing Resident/CRNA: Harle Stanford R Performed: resident/CRNA  Preanesthetic Checklist Completed: patient identified, site marked, surgical consent, pre-op evaluation, timeout performed, IV checked, risks and benefits discussed and monitors and equipment checked Spinal Block Patient position: sitting Prep: Betadine Patient monitoring: heart rate, cardiac monitor, continuous pulse ox and blood pressure Approach: midline Location: L3-4 Injection technique: single-shot Needle Needle type: Pencan  Needle gauge: 24 G Needle length: 10 cm Needle insertion depth: 7 cm Assessment Sensory level: T6 Additional Notes Timeout performed. SAB kit date checked. SAB without difficulty.

## 2017-01-30 NOTE — Interval H&P Note (Signed)
History and Physical Interval Note:  01/30/2017 7:13 AM  Cory Marks  has presented today for surgery, with the diagnosis of Osteoarthritis Left knee  The various methods of treatment have been discussed with the patient and family. After consideration of risks, benefits and other options for treatment, the patient has consented to  Procedure(s): LEFT TOTAL KNEE ARTHROPLASTY (Left) as a surgical intervention .  The patient's history has been reviewed, patient examined, no change in status, stable for surgery.  I have reviewed the patient's chart and labs.  Questions were answered to the patient's satisfaction.     Gearlean Alf

## 2017-01-30 NOTE — Progress Notes (Signed)
Care assumed for this patient at this time. Report received from [place], [place]. Pt is alert, calm and has no s/s of distress. VS and orders assessed and will continue to monitor and tx pt according to MD orders.

## 2017-01-30 NOTE — Transfer of Care (Signed)
Immediate Anesthesia Transfer of Care Note  Patient: Cory Marks  Procedure(s) Performed: Procedure(s): LEFT TOTAL KNEE ARTHROPLASTY (Left)  Patient Location: PACU  Anesthesia Type:Spinal and MAC combined with regional for post-op pain  Level of Consciousness: sedated  Airway & Oxygen Therapy: Patient Spontanous Breathing and Patient connected to face mask oxygen  Post-op Assessment: Report given to RN and Post -op Vital signs reviewed and stable  Post vital signs: Reviewed and stable  Last Vitals:  Vitals:   01/30/17 0845 01/30/17 0900  BP: 112/84 114/88  Pulse: (!) 45 (!) 48  Resp: 16 12  Temp:      Last Pain:  Vitals:   01/30/17 0733  TempSrc:   PainSc: 8       Patients Stated Pain Goal: 4 (47/42/59 5638)  Complications: No apparent anesthesia complications

## 2017-01-30 NOTE — Evaluation (Signed)
Physical Therapy Evaluation Patient Details Name: Cory Marks MRN: [id number] DOB: 11-01-1960 Today's Date: 01/30/2017   History of Present Illness  Pt is a 56 year old male s/p L TKA with hx signifcant for but not limited to Bells Palsy, neuropathy, R toe amputations, R foot drop (from high school football injury  Clinical Impression  Pt is s/p TKA resulting in the deficits listed below (see PT Problem List).  Pt will benefit from skilled PT to increase their independence and safety with mobility to allow discharge to the venue listed below.  Pt reports bilateral foot hx and requested to wear appropriate footwear for mobility so assisted with donning shoes.  Pt plans to d/c home to his mother's house.     Follow Up Recommendations Home health PT    Equipment Recommendations  None recommended by PT    Recommendations for Other Services       Precautions / Restrictions Precautions Precautions: Fall;Knee Precaution Comments: hx of toe amputations R foot and foot drop so pt wears special shoe, also reports fracture to sesamoid bone L foot and prefers shoe (states no restrictions) Required Braces or Orthoses: Knee Immobilizer - Left Restrictions Other Position/Activity Restrictions: WBAT      Mobility  Bed Mobility Overal bed mobility: Needs Assistance Bed Mobility: Supine to Sit     Supine to sit: Min assist;HOB elevated     General bed mobility comments: assist for L LE  Transfers Overall transfer level: Needs assistance Equipment used: Rolling walker (2 wheeled) Transfers: Sit to/from Stand Sit to Stand: Min assist         General transfer comment: verbal cues for UE and LE positioning, assist to rise  Ambulation/Gait Ambulation/Gait assistance: Min guard Ambulation Distance (Feet): 120 Feet Assistive device: Rolling walker (2 wheeled) Gait Pattern/deviations: Step-to pattern;Decreased stance time - left;Antalgic     General Gait Details: verbal cues for  sequence, RW positioning, weight bearing through RW, encouraged pt to perform less distance for pain control tonight and tomorrow however per his preference ambulated above distance  Stairs            Wheelchair Mobility    Modified Rankin (Stroke Patients Only)       Balance                                             Pertinent Vitals/Pain Pain Assessment: 0-10 Pain Score: 8  Pain Location: L knee Pain Descriptors / Indicators: Sore Pain Intervention(s): Limited activity within patient's tolerance;Monitored during session;Repositioned;Ice applied;Patient requesting pain meds-RN notified (wanted pain meds after therapy, RN notified after session)    Home Living Family/patient expects to be discharged to:: Private residence Living Arrangements: Parent Available Help at Discharge: Family Type of Home: House Home Access: Stairs to enter Entrance Stairs-Rails: None Entrance Stairs-Number of Steps: 2 Home Layout: Able to live on main level with bedroom/bathroom Home Equipment: Walker - 2 wheels Additional Comments: pt reports d/c home with his mother (as above)    Prior Function Level of Independence: Independent               Hand Dominance        Extremity/Trunk Assessment        Lower Extremity Assessment Lower Extremity Assessment: RLE deficits/detail;LLE deficits/detail RLE Deficits / Details: hx of foot drop LLE Deficits / Details: able to perform SLR, maintained  KI for safety, ROM TBA       Communication   Communication: No difficulties  Cognition Arousal/Alertness: Awake/alert Behavior During Therapy: WFL for tasks assessed/performed Overall Cognitive Status: Within Functional Limits for tasks assessed                                        General Comments      Exercises     Assessment/Plan    PT Assessment Patient needs continued PT services  PT Problem List Decreased strength;Decreased knowledge  of use of DME;Decreased mobility;Decreased range of motion;Pain;Decreased knowledge of precautions       PT Treatment Interventions Functional mobility training;Stair training;Gait training;Therapeutic exercise;DME instruction;Therapeutic activities;Patient/family education    PT Goals (Current goals can be found in the Care Plan section)  Acute Rehab PT Goals PT Goal Formulation: With patient Time For Goal Achievement: 02/04/17 Potential to Achieve Goals: Good    Frequency 7X/week   Barriers to discharge        Co-evaluation               AM-PAC PT "6 Clicks" Daily Activity  Outcome Measure Difficulty turning over in bed (including adjusting bedclothes, sheets and blankets)?: Total Difficulty moving from lying on back to sitting on the side of the bed? : Total Difficulty sitting down on and standing up from a chair with arms (e.g., wheelchair, bedside commode, etc,.)?: Total Help needed moving to and from a bed to chair (including a wheelchair)?: A Little Help needed walking in hospital room?: A Little Help needed climbing 3-5 steps with a railing? : A Little 6 Click Score: 12    End of Session Equipment Utilized During Treatment: Gait belt;Left knee immobilizer Activity Tolerance: Patient tolerated treatment well Patient left: in chair;with call bell/phone within reach Nurse Communication: Mobility status;Patient requests pain meds PT Visit Diagnosis: Other abnormalities of gait and mobility (R26.89)    Time: 5035-4656 PT Time Calculation (min) (ACUTE ONLY): 21 min   Charges:   PT Evaluation $PT Eval Low Complexity: 1 Procedure     PT G Codes:        Carmelia Bake, PT, DPT 01/30/2017 Pager: [phone]  York Ram E 01/30/2017, 5:56 PM

## 2017-01-30 NOTE — Anesthesia Preprocedure Evaluation (Signed)
Anesthesia Evaluation  Patient identified by MRN, date of birth, ID band Patient awake    Reviewed: Allergy & Precautions, H&P , NPO status , Patient's Chart, lab work & pertinent test results  Airway Mallampati: II  TM Distance: >3 FB Neck ROM: Full    Dental no notable dental hx. (+) Teeth Intact, Dental Advisory Given   Pulmonary neg pulmonary ROS,    Pulmonary exam normal breath sounds clear to auscultation       Cardiovascular hypertension, Pt. on medications + CAD and +CHF  + dysrhythmias Atrial Fibrillation  Rhythm:Irregular Rate:Normal     Neuro/Psych negative neurological ROS  negative psych ROS   GI/Hepatic negative GI ROS, Neg liver ROS,   Endo/Other  diabetes, Type 2, Oral Hypoglycemic Agents  Renal/GU Renal disease  negative genitourinary   Musculoskeletal  (+) Arthritis , Osteoarthritis,    Abdominal   Peds  Hematology negative hematology ROS (+)   Anesthesia Other Findings   Reproductive/Obstetrics negative OB ROS                             Anesthesia Physical  Anesthesia Plan  ASA: III  Anesthesia Plan: Regional, MAC and Spinal   Post-op Pain Management:  Regional for Post-op pain   Induction: Intravenous  PONV Risk Score and Plan: 1 and Ondansetron, Propofol, Midazolam and Treatment may vary due to age or medical condition  Airway Management Planned: Simple Face Mask  Additional Equipment:   Intra-op Plan:   Post-operative Plan:   Informed Consent: I have reviewed the patients History and Physical, chart, labs and discussed the procedure including the risks, benefits and alternatives for the proposed anesthesia with the patient or authorized representative who has indicated his/her understanding and acceptance.   Dental advisory given  Plan Discussed with: CRNA  Anesthesia Plan Comments:         Anesthesia Quick Evaluation

## 2017-01-30 NOTE — Progress Notes (Signed)
Dr. Ambrose Pancoast is at the bedside at this time to assess pt HR. Dr. Shann Medal that pt is free to go to room on the floor. Pt is alert calm and talkative and has no s/s of distress. VS and orders assessed and pt denies complaints. Will continue to monitor and tx pt according to MD orders.

## 2017-01-30 NOTE — Op Note (Signed)
OPERATIVE REPORT-TOTAL KNEE ARTHROPLASTY   Pre-operative diagnosis- Osteoarthritis  Left knee(s)  Post-operative diagnosis- Osteoarthritis Left knee(s)  Procedure-  Left  Total Knee Arthroplasty  Surgeon- Dione Plover. Eleina Jergens, MD  Assistant- Ardeen Jourdain, PA-C   Anesthesia-  Adductor canal block and spinal  EBL-* No blood loss amount entered *   Drains Hemovac  Tourniquet time-  Total Tourniquet Time Documented: Thigh (Left) - 30 minutes Total: Thigh (Left) - 30 minutes     Complications- None  Condition-PACU - hemodynamically stable.   Brief Clinical Note   Cory Marks is a 56 y.o. year old male with end stage OA of his left knee with progressively worsening pain and dysfunction. He has constant pain, with activity and at rest and significant functional deficits with difficulties even with ADLs. He has had extensive non-op management including analgesics, injections of cortisone and viscosupplements, and home exercise program, but remains in significant pain with significant dysfunction. Radiographs show bone on bone arthritis lateral and patellofemoral with large valgus deformity. He presents now for left Total Knee Arthroplasty.    Procedure in detail---   The patient is brought into the operating room and positioned supine on the operating table. After successful administration of  Adductor canal block and spinal,   a tourniquet is placed high on the  Left thigh(s) and the lower extremity is prepped and draped in the usual sterile fashion. Time out is performed by the operating team and then the  Left lower extremity is wrapped in Esmarch, knee flexed and the tourniquet inflated to 300 mmHg.       A midline incision is made with a ten blade through the subcutaneous tissue to the level of the extensor mechanism. A fresh blade is used to make a medial parapatellar arthrotomy. Soft tissue over the proximal medial tibia is subperiosteally elevated to the joint line with a  knife and into the semimembranosus bursa with a Cobb elevator. Soft tissue over the proximal lateral tibia is elevated with attention being paid to avoiding the patellar tendon on the tibial tubercle. The patella is everted, knee flexed 90 degrees and the ACL and PCL are removed. Findings are bone on bone lateral and patellofemoral with large global osteophytes.        The drill is used to create a starting hole in the distal femur and the canal is thoroughly irrigated with sterile saline to remove the fatty contents. The 5 degree Left  valgus alignment guide is placed into the femoral canal and the distal femoral cutting block is pinned to remove 10 mm off the distal femur. Resection is made with an oscillating saw.      The tibia is subluxed forward and the menisci are removed. The extramedullary alignment guide is placed referencing proximally at the medial aspect of the tibial tubercle and distally along the second metatarsal axis and tibial crest. The block is pinned to remove 90mm off the more deficient lateral  side. Resection is made with an oscillating saw. Size 5is the most appropriate size for the tibia and the proximal tibia is prepared with the modular drill and keel punch for that size.      The femoral sizing guide is placed and size 5 is most appropriate. Rotation is marked off the epicondylar axis and confirmed by creating a rectangular flexion gap at 90 degrees. The size 5 cutting block is pinned in this rotation and the anterior, posterior and chamfer cuts are made with the oscillating saw. The intercondylar  block is then placed and that cut is made.      Trial size 5 tibial component, trial size 5 posterior stabilized femur and a 10  mm posterior stabilized rotating platform insert trial is placed. Full extension is achieved with excellent varus/valgus and anterior/posterior balance throughout full range of motion. The patella is everted and thickness measured to be 27  mm. Free hand resection  is taken to 15 mm, a 41 template is placed, lug holes are drilled, trial patella is placed, and it tracks normally. Osteophytes are removed off the posterior femur with the trial in place. All trials are removed and the cut bone surfaces prepared with pulsatile lavage. Cement is mixed and once ready for implantation, the size 5 tibial implant, size  5 posterior stabilized femoral component, and the size 41 patella are cemented in place and the patella is held with the clamp. The trial insert is placed and the knee held in full extension. The Exparel (20 ml mixed with 60 ml saline) is injected into the extensor mechanism, posterior capsule, medial and lateral gutters and subcutaneous tissues.  All extruded cement is removed and once the cement is hard the permanent 10 mm posterior stabilized rotating platform insert is placed into the tibial tray.      The wound is copiously irrigated with saline solution and the extensor mechanism closed over a hemovac drain with #1 V-loc suture. The tourniquet is released for a total tourniquet time of 30  minutes. Flexion against gravity is 135 degrees and the patella tracks normally. The valgus deformity is corrected. Subcutaneous tissue is closed with 2.0 vicryl and subcuticular with running 4.0 Monocryl. The incision is cleaned and dried and steri-strips and a bulky sterile dressing are applied. The limb is placed into a knee immobilizer and the patient is awakened and transported to recovery in stable condition.      Please note that a surgical assistant was a medical necessity for this procedure in order to perform it in a safe and expeditious manner. Surgical assistant was necessary to retract the ligaments and vital neurovascular structures to prevent injury to them and also necessary for proper positioning of the limb to allow for anatomic placement of the prosthesis.   Dione Plover Alois Colgan, MD    01/30/2017, 10:38 AM

## 2017-01-31 LAB — CBC
HCT: 34.3 % — ABNORMAL LOW (ref 39.0–52.0)
Hemoglobin: 11.7 g/dL — ABNORMAL LOW (ref 13.0–17.0)
MCH: 32.6 pg (ref 26.0–34.0)
MCHC: 34.1 g/dL (ref 30.0–36.0)
MCV: 95.5 fL (ref 78.0–100.0)
PLATELETS: 154 10*3/uL (ref 150–400)
RBC: 3.59 MIL/uL — ABNORMAL LOW (ref 4.22–5.81)
RDW: 13.5 % (ref 11.5–15.5)
WBC: 10.7 10*3/uL — ABNORMAL HIGH (ref 4.0–10.5)

## 2017-01-31 LAB — BASIC METABOLIC PANEL
ANION GAP: 7 (ref 5–15)
BUN: 30 mg/dL — ABNORMAL HIGH (ref 6–20)
CALCIUM: 8.7 mg/dL — AB (ref 8.9–10.3)
CO2: 24 mmol/L (ref 22–32)
Chloride: 106 mmol/L (ref 101–111)
Creatinine, Ser: 1.42 mg/dL — ABNORMAL HIGH (ref 0.61–1.24)
GFR, EST NON AFRICAN AMERICAN: 54 mL/min — AB (ref >60)
GLUCOSE: 133 mg/dL — AB (ref 65–99)
Potassium: 4.5 mmol/L (ref 3.5–5.1)
Sodium: 137 mmol/L (ref 135–145)

## 2017-01-31 LAB — GLUCOSE, CAPILLARY
Glucose-Capillary: 127 mg/dL — ABNORMAL HIGH (ref 65–99)
Glucose-Capillary: 133 mg/dL — ABNORMAL HIGH (ref 65–99)
Glucose-Capillary: 130 mg/dL — ABNORMAL HIGH (ref 65–99)
Glucose-Capillary: 137 mg/dL — ABNORMAL HIGH (ref 65–99)

## 2017-01-31 MED ORDER — HYDROMORPHONE HCL 2 MG PO TABS: 2.0000 mg | ORAL_TABLET | ORAL | 0 refills | Status: DC | PRN

## 2017-01-31 MED ORDER — METHOCARBAMOL 500 MG PO TABS: 500.0000 mg | ORAL_TABLET | Freq: Four times a day (QID) | ORAL | 0 refills | Status: DC | PRN

## 2017-01-31 MED ORDER — TRAMADOL HCL 50 MG PO TABS: 50.0000 mg | ORAL_TABLET | Freq: Four times a day (QID) | ORAL | 0 refills | Status: DC | PRN

## 2017-01-31 MED ORDER — RIVAROXABAN 10 MG PO TABS: 10.0000 mg | ORAL_TABLET | Freq: Every day | ORAL | 0 refills | Status: DC

## 2017-01-31 NOTE — Progress Notes (Signed)
   01/31/17 1600  PT Visit Information  Last PT Received On 01/31/17  Assistance Needed +1  History of Present Illness Pt is a 56 year old male s/p L TKA with hx signifcant for but not limited to Bells Palsy, neuropathy, R toe amputations, R foot drop (from high school football injury  Subjective Data  Subjective Pt ambulated in hallway and assisted back to bed.  Pt performed LE exercises.  Precautions  Precautions Fall;Knee  Precaution Comments hx of toe amputations R foot and foot drop so pt wears special shoe, also reports fracture to sesamoid bone L foot and prefers shoe (states no restrictions)  Required Braces or Orthoses Knee Immobilizer - Left  Restrictions  Other Position/Activity Restrictions WBAT  Pain Assessment  Pain Assessment 0-10  Pain Score 5  Pain Location L knee  Pain Descriptors / Indicators Sore;Aching  Pain Intervention(s) Limited activity within patient's tolerance;Monitored during session;Repositioned;Ice applied  Cognition  Arousal/Alertness Awake/alert  Behavior During Therapy WFL for tasks assessed/performed  Overall Cognitive Status Within Functional Limits for tasks assessed  Bed Mobility  Bed Mobility Sit to Supine  Sit to supine Supervision  Transfers  Overall transfer level Needs assistance  Equipment used Rolling walker (2 wheeled)  Transfers Sit to/from Stand  Sit to Stand Min guard  General transfer comment verbal cues for UE and LE positioning  Ambulation/Gait  Ambulation/Gait assistance Min guard  Ambulation Distance (Feet) 240 Feet  Assistive device Rolling walker (2 wheeled)  Gait Pattern/deviations Step-to pattern;Decreased stance time - left;Antalgic  General Gait Details verbal cues for RW positioning, posture  Exercises  Exercises Total Joint  Total Joint Exercises  Ankle Circles/Pumps AROM;10 reps;Both  Quad Sets AROM;Both;10 reps  Short Arc [employer];Left;10 reps  Heel Slides AAROM;Right;10 reps  Hip ABduction/ADduction  AROM;Left;10 reps  Straight Leg Raises AAROM;Left;10 reps  Goniometric ROM approx 65* AAROM knee flexion during heel slides  PT - End of Session  Equipment Utilized During Treatment Left knee immobilizer  Activity Tolerance Patient tolerated treatment well  Patient left in bed;with call bell/phone within reach;with family/visitor present  PT - Assessment/Plan  PT Plan Current plan remains appropriate  PT Visit Diagnosis Other abnormalities of gait and mobility (R26.89)  PT Frequency (ACUTE ONLY) 7X/week  Follow Up Recommendations Home health PT  PT equipment None recommended by PT  AM-PAC PT "6 Clicks" Daily Activity Outcome Measure  Difficulty turning over in bed (including adjusting bedclothes, sheets and blankets)? 4  Difficulty moving from lying on back to sitting on the side of the bed?  3  Difficulty sitting down on and standing up from a chair with arms (e.g., wheelchair, bedside commode, etc,.)? 3  Help needed moving to and from a bed to chair (including a wheelchair)? 3  Help needed walking in hospital room? 3  Help needed climbing 3-5 steps with a railing?  3  6 Click Score 19  Mobility G Code  CJ  PT Goal Progression  Progress towards PT goals Progressing toward goals  PT Time Calculation  PT Start Time (ACUTE ONLY) 1440  PT Stop Time (ACUTE ONLY) 1503  PT Time Calculation (min) (ACUTE ONLY) 23 min  PT General Charges  $$ ACUTE PT VISIT 1 Procedure  PT Treatments  $Gait Training 8-22 mins  $Therapeutic Exercise 8-22 mins   Carmelia Bake, PT, DPT 01/31/2017 Pager: [phone]

## 2017-01-31 NOTE — Discharge Summary (Signed)
Physician Discharge Summary   Patient ID: Cory Marks MRN: [id number] DOB/AGE: 1960-12-11 56 y.o.  Admit date: 01/30/2017 Discharge date: 02/01/2017  Primary Diagnosis:  Osteoarthritis  Left knee(s) Admission Diagnoses:  Past Medical History:  Diagnosis Date  . Arthritis   . Bell's palsy   . CAD (coronary artery disease)   . Cancer [facility]) 2012   Mantle Cell Cancer- S/P stem cell transplant  . CKD (chronic kidney disease) stage 3, GFR 30-59 ml/min   . Diabetes mellitus without complication (HCC)    hx of lost weight due to cancer not on medications since 2014   . Dysrhythmia    atrial fib and atrial flutter  . History of chemotherapy last tx 2014  . History of diabetes mellitus NONE SINCE 2012 CANCER  . History of splenectomy   . Hyperlipemia   . Hypertension   . Neuromuscular disorder ([place])    peripheral neuropathy  . Neuropathy    right leg due to football injury  . Polysubstance abuse   . Sinus bradycardia    Discharge Diagnoses:   Principal Problem:   OA (osteoarthritis) of knee  Estimated body mass index is 27.53 kg/m as calculated from the following:   Height as of this encounter: 6' (1.829 m).   Weight as of this encounter: 92.1 kg (203 lb).  Procedure:  Procedure(s) (LRB): LEFT TOTAL KNEE ARTHROPLASTY (Left)   Consults: None  HPI: Cory Marks is a 56 y.o. year old male with end stage OA of his left knee with progressively worsening pain and dysfunction. He has constant pain, with activity and at rest and significant functional deficits with difficulties even with ADLs. He has had extensive non-op management including analgesics, injections of cortisone and viscosupplements, and home exercise program, but remains in significant pain with significant dysfunction. Radiographs show bone on bone arthritis lateral and patellofemoral with large valgus deformity. He presents now for left Total Knee Arthroplasty.   Laboratory Data: Admission on 01/30/2017    Component Date Value Ref Range Status  . Glucose-Capillary 01/30/2017 75  65 - 99 mg/dL Final  . Glucose-Capillary 01/30/2017 102* 65 - 99 mg/dL Final  . Glucose-Capillary 01/30/2017 148* 65 - 99 mg/dL Final  . WBC 01/31/2017 10.7* 4.0 - 10.5 K/uL Final  . RBC 01/31/2017 3.59* 4.22 - 5.81 MIL/uL Final  . Hemoglobin 01/31/2017 11.7* 13.0 - 17.0 g/dL Final  . HCT 01/31/2017 34.3* 39.0 - 52.0 % Final  . MCV 01/31/2017 95.5  78.0 - 100.0 fL Final  . MCH 01/31/2017 32.6  26.0 - 34.0 pg Final  . MCHC 01/31/2017 34.1  30.0 - 36.0 g/dL Final  . RDW 01/31/2017 13.5  11.5 - 15.5 % Final  . Platelets 01/31/2017 154  150 - 400 K/uL Final  . Sodium 01/31/2017 137  135 - 145 mmol/L Final  . Potassium 01/31/2017 4.5  3.5 - 5.1 mmol/L Final  . Chloride 01/31/2017 106  101 - 111 mmol/L Final  . CO2 01/31/2017 24  22 - 32 mmol/L Final  . Glucose, Bld 01/31/2017 133* 65 - 99 mg/dL Final  . BUN 01/31/2017 30* 6 - 20 mg/dL Final  . Creatinine, Ser 01/31/2017 1.42* 0.61 - 1.24 mg/dL Final  . Calcium 01/31/2017 8.7* 8.9 - 10.3 mg/dL Final  . GFR calc non Af Amer 01/31/2017 54* >60 mL/min Final  . GFR calc Af Amer 01/31/2017 >60  >60 mL/min Final   Comment: (NOTE) The eGFR has been calculated using the CKD EPI equation. This calculation  has not been validated in all clinical situations. eGFR's persistently <60 mL/min signify possible Chronic Kidney Disease.   . Anion gap 01/31/2017 7  5 - 15 Final  . Glucose-Capillary 01/30/2017 267* 65 - 99 mg/dL Final  . Glucose-Capillary 01/30/2017 150* 65 - 99 mg/dL Final  . Glucose-Capillary 01/31/2017 127* 65 - 99 mg/dL Final  . Glucose-Capillary 01/31/2017 133* 65 - 99 mg/dL Final  . Glucose-Capillary 01/31/2017 130* 65 - 99 mg/dL Final  Hospital Outpatient Visit on 01/25/2017  Component Date Value Ref Range Status  . aPTT 01/25/2017 28  24 - 36 seconds Final  . WBC 01/25/2017 5.0  4.0 - 10.5 K/uL Final  . RBC 01/25/2017 4.26  4.22 - 5.81 MIL/uL Final  .  Hemoglobin 01/25/2017 14.0  13.0 - 17.0 g/dL Final  . HCT 01/25/2017 41.7  39.0 - 52.0 % Final  . MCV 01/25/2017 97.9  78.0 - 100.0 fL Final  . MCH 01/25/2017 32.9  26.0 - 34.0 pg Final  . MCHC 01/25/2017 33.6  30.0 - 36.0 g/dL Final  . RDW 01/25/2017 14.3  11.5 - 15.5 % Final  . Platelets 01/25/2017 196  150 - 400 K/uL Final  . Sodium 01/25/2017 142  135 - 145 mmol/L Final  . Potassium 01/25/2017 4.4  3.5 - 5.1 mmol/L Final  . Chloride 01/25/2017 108  101 - 111 mmol/L Final  . CO2 01/25/2017 24  22 - 32 mmol/L Final  . Glucose, Bld 01/25/2017 115* 65 - 99 mg/dL Final  . BUN 01/25/2017 27* 6 - 20 mg/dL Final  . Creatinine, Ser 01/25/2017 1.68* 0.61 - 1.24 mg/dL Final  . Calcium 01/25/2017 9.6  8.9 - 10.3 mg/dL Final  . Total Protein 01/25/2017 6.2* 6.5 - 8.1 g/dL Final  . Albumin 01/25/2017 4.1  3.5 - 5.0 g/dL Final  . AST 01/25/2017 35  15 - 41 U/L Final  . ALT 01/25/2017 14* 17 - 63 U/L Final  . Alkaline Phosphatase 01/25/2017 41  38 - 126 U/L Final  . Total Bilirubin 01/25/2017 1.0  0.3 - 1.2 mg/dL Final  . GFR calc non Af Amer 01/25/2017 44* >60 mL/min Final  . GFR calc Af Amer 01/25/2017 51* >60 mL/min Final   Comment: (NOTE) The eGFR has been calculated using the CKD EPI equation. This calculation has not been validated in all clinical situations. eGFR's persistently <60 mL/min signify possible Chronic Kidney Disease.   . Anion gap 01/25/2017 10  5 - 15 Final  . ABO/RH(D) 01/25/2017 A POS   Final  . Antibody Screen 01/25/2017 NEG   Final  . Sample Expiration 01/25/2017 02/02/2017   Final  . Extend sample reason 01/25/2017 NO TRANSFUSIONS OR PREGNANCY IN THE PAST 3 MONTHS   Final  . MRSA, PCR 01/25/2017 NEGATIVE  NEGATIVE Final  . Staphylococcus aureus 01/25/2017 NEGATIVE  NEGATIVE Final   Comment:        The Xpert SA Assay (FDA approved for NASAL specimens in patients over 11 years of age), is one component of a comprehensive surveillance program.  Test performance  has been validated by [facility] for patients greater than or equal to 28 year old. It is not intended to diagnose infection nor to guide or monitor treatment.   . Prothrombin Time 01/25/2017 12.2  11.4 - 15.2 seconds Final  . INR 01/25/2017 0.91   Final     X-Rays:No results found.  EKG: Orders placed or performed in visit on 01/16/17  . EKG 12-Lead  Hospital Course: YEHUDAH STANDING is a 56 y.o. who was admitted to [facility]. They were brought to the operating room on 01/30/2017 and underwent Procedure(s): LEFT TOTAL KNEE ARTHROPLASTY.  Patient tolerated the procedure well and was later transferred to the recovery room and then to the orthopaedic floor for postoperative care.  They were given PO and IV analgesics for pain control following their surgery.  They were given 24 hours of postoperative antibiotics of  Anti-infectives    Start     Dose/Rate Route Frequency Ordered Stop   01/31/17 1000  penicillin v potassium (VEETID) tablet 500 mg     500 mg Oral 2 times daily 01/30/17 1343     01/30/17 1600  ceFAZolin (ANCEF) IVPB 2g/100 mL premix     2 g 200 mL/hr over 30 Minutes Intravenous Every 6 hours 01/30/17 1343 01/30/17 2230   01/30/17 0709  ceFAZolin (ANCEF) 2-4 GM/100ML-% IVPB    Comments:  [place] Session   : cabinet override      01/30/17 0709 01/30/17 0926   01/30/17 0705  ceFAZolin (ANCEF) IVPB 2g/100 mL premix     2 g 200 mL/hr over 30 Minutes Intravenous On call to O.R. 01/30/17 7858 01/30/17 0926     and started on DVT prophylaxis in the form of Xarelto.   PT and OT were ordered for total joint protocol.  Discharge planning consulted to help with postop disposition and equipment needs.  Patient had a decent night on the evening of surgery.  They started to get up OOB with therapy on day one. Hemovac drain was pulled without difficulty.  Continued to work with therapy into day two.  Dressing was changed on day two and the incision was healing well. Patient  was seen in rounds and was ready to go home.   Diet: Cardiac diet Activity:WBAT Follow-up:in 2 weeks Disposition - Home Discharged Condition: good   Discharge Instructions    Call MD / Call 911    Complete by:  As directed    If you experience chest pain or shortness of breath, CALL 911 and be transported to the hospital emergency room.  If you develope a fever above 101 F, pus (white drainage) or increased drainage or redness at the wound, or calf pain, call your surgeon's office.   Change dressing    Complete by:  As directed    Change dressing daily with sterile 4 x 4 inch gauze dressing and apply TED hose. Do not submerge the incision under water.   Constipation Prevention    Complete by:  As directed    Drink plenty of fluids.  Prune juice may be helpful.  You may use a stool softener, such as Colace (over the counter) 100 mg twice a day.  Use MiraLax (over the counter) for constipation as needed.   Diet - low sodium heart healthy    Complete by:  As directed    Diet Carb Modified    Complete by:  As directed    Discharge instructions    Complete by:  As directed    Take Xarelto for two and a half more weeks, then discontinue Xarelto. Once the patient has completed the blood thinner regimen, then take a Baby 81 mg Aspirin daily for three more weeks.   Pick up stool softner and laxative for home use following surgery while on pain medications. Do not submerge incision under water. Please use good hand washing techniques while changing dressing each day. May  shower starting three days after surgery. Please use a clean towel to pat the incision dry following showers. Continue to use ice for pain and swelling after surgery. Do not use any lotions or creams on the incision until instructed by your surgeon.  Wear both TED hose on both legs during the day every day for three weeks, but may remove the TED hose at night at home.  Postoperative Constipation Protocol  Constipation  - defined medically as fewer than three stools per week and severe constipation as less than one stool per week.  One of the most common issues patients have following surgery is constipation.  Even if you have a regular bowel pattern at home, your normal regimen is likely to be disrupted due to multiple reasons following surgery.  Combination of anesthesia, postoperative narcotics, change in appetite and fluid intake all can affect your bowels.  In order to avoid complications following surgery, here are some recommendations in order to help you during your recovery period.  Colace (docusate) - Pick up an over-the-counter form of Colace or another stool softener and take twice a day as long as you are requiring postoperative pain medications.  Take with a full glass of water daily.  If you experience loose stools or diarrhea, hold the colace until you stool forms back up.  If your symptoms do not get better within 1 week or if they get worse, check with your doctor.  Dulcolax (bisacodyl) - Pick up over-the-counter and take as directed by the product packaging as needed to assist with the movement of your bowels.  Take with a full glass of water.  Use this product as needed if not relieved by Colace only.   MiraLax (polyethylene glycol) - Pick up over-the-counter to have on hand.  MiraLax is a solution that will increase the amount of water in your bowels to assist with bowel movements.  Take as directed and can mix with a glass of water, juice, soda, coffee, or tea.  Take if you go more than two days without a movement. Do not use MiraLax more than once per day. Call your doctor if you are still constipated or irregular after using this medication for 7 days in a row.  If you continue to have problems with postoperative constipation, please contact the office for further assistance and recommendations.  If you experience "the worst abdominal pain ever" or develop nausea or vomiting, please contact the  office immediatly for further recommendations for treatment.   Do not put a pillow under the knee. Place it under the heel.    Complete by:  As directed    Do not sit on low chairs, stoools or toilet seats, as it may be difficult to get up from low surfaces    Complete by:  As directed    Driving restrictions    Complete by:  As directed    No driving until released by the physician.   Increase activity slowly as tolerated    Complete by:  As directed    Lifting restrictions    Complete by:  As directed    No lifting until released by the physician.   Patient may shower    Complete by:  As directed    You may shower without a dressing once there is no drainage.  Do not wash over the wound.  If drainage remains, do not shower until drainage stops.   TED hose    Complete by:  As directed  Use stockings (TED hose) for 3 weeks on both leg(s).  You may remove them at night for sleeping.   Weight bearing as tolerated    Complete by:  As directed    Laterality:  left   Extremity:  Lower     Allergies as of 01/31/2017      Reactions   Oxycodone Itching, Palpitations      Medication List    STOP taking these medications   b complex vitamins tablet   pyridOXINE 100 MG tablet Commonly known as:  VITAMIN B-6     TAKE these medications   amLODipine 10 MG tablet Commonly known as:  NORVASC TAKE 1 TABLET EVERY MORNING   atorvastatin 40 MG tablet Commonly known as:  LIPITOR Take 40 mg by mouth daily.   gabapentin 300 MG capsule Commonly known as:  NEURONTIN Take 1 capsule (300 mg total) by mouth 3 (three) times daily. What changed:  how much to take   HYDROmorphone 2 MG tablet Commonly known as:  DILAUDID Take 1-2 tablets (2-4 mg total) by mouth every 4 (four) hours as needed for severe pain.   lisinopril 20 MG tablet Commonly known as:  PRINIVIL,ZESTRIL Take 1 tablet (20 mg total) by mouth daily.   methocarbamol 500 MG tablet Commonly known as:  ROBAXIN Take 1 tablet  (500 mg total) by mouth every 6 (six) hours as needed for muscle spasms.   penicillin v potassium 500 MG tablet Commonly known as:  VEETID Take 500 mg by mouth 2 (two) times daily.   rivaroxaban 10 MG Tabs tablet Commonly known as:  XARELTO Take 1 tablet (10 mg total) by mouth daily with breakfast. Take Xarelto for two and a half more weeks following discharge from the hospital, then discontinue Xarelto. Once the patient has completed the blood thinner regimen, then take a Baby 81 mg Aspirin daily for three more weeks. Start taking on:  02/01/2017   traMADol 50 MG tablet Commonly known as:  ULTRAM Take 1-2 tablets (50-100 mg total) by mouth every 6 (six) hours as needed for moderate pain. What changed:  how much to take  when to take this  reasons to take this            Durable Medical Equipment        Start     Ordered   01/31/17 1457  For home use only DME Shower stool  Once     01/31/17 1457   01/31/17 1455  For home use only DME Walker rolling  Once    Question:  Patient needs a walker to treat with the following condition  Answer:  S/P knee replacement   01/31/17 1457     Follow-up Information    [facility] Follow up.   Specialty:  Home Health Services Why:  [facility] physical therapy Contact information: [address] [place]  [address] [place] Follow up.   Why:  shower chair,rolling walker w/wheels Contact information: [address] [place] [address] [phone]        Gaynelle Arabian, MD. Schedule an appointment as soon as possible for a visit on 02/14/2017.   Specialty:  Orthopedic Surgery Contact information: [address] [place] [address] [phone]           Signed: Arlee Muslim, PA-C Orthopaedic Surgery 01/31/2017, 9:00 PM

## 2017-01-31 NOTE — Progress Notes (Signed)
   Subjective: 1 Day Post-Op Procedure(s) (LRB): LEFT TOTAL KNEE ARTHROPLASTY (Left) Patient reports pain as mild.   Patient seen in rounds for Dr. Wynelle Link.  Had nausea earlier today. Voiding okay. Patient is well, but has had some minor complaints of pain in the knee, requiring pain medications Started therapy today and walked 320 feet and then later 240 feet. Plan is to go Home after hospital stay.  Objective: Vital signs in last 24 hours: Temp:  [98 F (36.7 C)-98.9 F (37.2 C)] 98.1 F (36.7 C) (06/26 2044) Pulse Rate:  [45-55] 51 (06/26 2044) Resp:  [14-20] 17 (06/26 2044) BP: (121-140)/(77-95) 139/82 (06/26 2044) SpO2:  [95 %-99 %] 95 % (06/26 2044)  Intake/Output from previous day:  Intake/Output Summary (Last 24 hours) at 01/31/17 2053 Last data filed at 01/31/17 2045  Gross per 24 hour  Intake             4145 ml  Output             3360 ml  Net              785 ml    Intake/Output this shift: Total I/O In: 240 [P.O.:240] Out: 350 [Urine:350]  Labs:  Recent Labs  01/31/17 0416  HGB 11.7*    Recent Labs  01/31/17 0416  WBC 10.7*  RBC 3.59*  HCT 34.3*  PLT 154    Recent Labs  01/31/17 0416  NA 137  K 4.5  CL 106  CO2 24  BUN 30*  CREATININE 1.42*  GLUCOSE 133*  CALCIUM 8.7*   No results for input(s): LABPT, INR in the last 72 hours.  EXAM General - Patient is Alert and Appropriate Extremity - Neurovascular intact Sensation intact distally Intact pulses distally Dorsiflexion/Plantar flexion intact Dressing - dressing C/D/I Motor Function - intact, moving foot and toes well on exam.  Hemovac pulled without difficulty.  Past Medical History:  Diagnosis Date  . Arthritis   . Bell's palsy   . CAD (coronary artery disease)   . Cancer [facility]) 2012   Mantle Cell Cancer- S/P stem cell transplant  . CKD (chronic kidney disease) stage 3, GFR 30-59 ml/min   . Diabetes mellitus without complication (HCC)    hx of lost weight due to cancer  not on medications since 2014   . Dysrhythmia    atrial fib and atrial flutter  . History of chemotherapy last tx 2014  . History of diabetes mellitus NONE SINCE 2012 CANCER  . History of splenectomy   . Hyperlipemia   . Hypertension   . Neuromuscular disorder ([place])    peripheral neuropathy  . Neuropathy    right leg due to football injury  . Polysubstance abuse   . Sinus bradycardia     Assessment/Plan: 1 Day Post-Op Procedure(s) (LRB): LEFT TOTAL KNEE ARTHROPLASTY (Left) Principal Problem:   OA (osteoarthritis) of knee  Estimated body mass index is 27.53 kg/m as calculated from the following:   Height as of this encounter: 6' (1.829 m).   Weight as of this encounter: 92.1 kg (203 lb). Advance diet Up with therapy Plan for discharge tomorrow Discharge home with home health  DVT Prophylaxis - Xarelto Weight-Bearing as tolerated to left leg D/C O2 and Pulse OX and try on Room Air  Arlee Muslim, PA-C Orthopaedic Surgery 01/31/2017, 8:53 PM

## 2017-01-31 NOTE — Care Management Note (Signed)
Case Management Note  Patient Details  Name: Cory Marks MRN: [id number] Date of Birth: 06-May-1961  Subjective/Objective: 56 y/o m admitted w/OA knee. From home, has 3n1,standard rw. PT-recc [facility]PT[facility] rep [name] already following. OT-no f/u.Ordered for shower chair,rw w/wheels-[facility] rep [name] aware of orders. Patient will stay w/hi smother address is:[address] [place] [address], c# [phone], h#[phone]. No further CM needs.                   Action/Plan:d/c home w/HHC/dme.   Expected Discharge Date:  02/01/17               Expected Discharge Plan:  [place]  In-House Referral:     Discharge planning Services  CM Consult  Post Acute Care Choice:  Durable Medical Equipment (3n1) Choice offered to:  Patient  DME Arranged:  Shower stool, Walker rolling DME Agency:  [facility] [employer] [facility] [employer]  [place]:  PT [place]:     Status of Service:  Completed, signed off  If discussed at [employer], dates discussed:    Additional Comments:  Dessa Phi, RN 01/31/2017, 2:19 PM

## 2017-01-31 NOTE — Progress Notes (Signed)
Occupational Therapy Evaluation Patient Details Name: Cory Marks MRN: [id number] DOB: 12/17/1960 Today's Date: 01/31/2017    History of Present Illness Pt is a 56 year old male s/p L TKA with hx signifcant for but not limited to Bells Palsy, neuropathy, R toe amputations, R foot drop (from high school football injury   Clinical Impression   PTA, pt independent with ADL and mobility. Living with his Mom since his Father passed away 2 years ago. Pt currently requires min A with ADL and mobility. Will follow acutely to maximize functional level of independence to facilitate safe DC home.     Follow Up Recommendations  No OT follow up;Supervision - Intermittent    Equipment Recommendations  None recommended by OT    Recommendations for Other Services       Precautions / Restrictions Precautions Precautions: Fall;Knee Precaution Comments: hx of toe amputations R foot and foot drop so pt wears special shoe, also reports fracture to sesamoid bone L foot and prefers shoe (states no restrictions) Required Braces or Orthoses: Knee Immobilizer - Left Restrictions Weight Bearing Restrictions: No Other Position/Activity Restrictions: WBAT      Mobility Bed Mobility Overal bed mobility: Modified Independent                Transfers Overall transfer level: Needs assistance Equipment used: Rolling walker (2 wheeled) Transfers: Sit to/from Stand Sit to Stand: Min guard         General transfer comment: good carry over of hand position from prior session    Balance Overall balance assessment: Needs assistance   Sitting balance-Leahy Scale: Good       Standing balance-Leahy Scale: Fair                             ADL either performed or assessed with clinical judgement   ADL Overall ADL's : Needs assistance/impaired         Upper Body Bathing: Set up   Lower Body Bathing: Minimal assistance;Sit to/from stand   Upper Body Dressing : Set  up;Sitting   Lower Body Dressing: Minimal assistance;Sit to/from stand   Toilet Transfer: Min guard;RW;Ambulation   Toileting- Water quality scientist and Hygiene: Supervision/safety;Sit to/from stand       Functional mobility during ADLs: Min guard;Rolling walker General ADL Comments: Pt plans to use his father's BSC. Will benefit from practice with 3in1 in tub. Uses an orthopedic shoe on R foot. will need to practice LB ADL     Vision         Perception     Praxis      Pertinent Vitals/Pain Pain Assessment: 0-10 Pain Score: 5  Pain Location: L knee Pain Descriptors / Indicators: Sore Pain Intervention(s): Limited activity within patient's tolerance;Ice applied     Hand Dominance Right   Extremity/Trunk Assessment Upper Extremity Assessment Upper Extremity Assessment: Overall WFL for tasks assessed   Lower Extremity Assessment Lower Extremity Assessment: Defer to PT evaluation RLE Deficits / Details: hx of foot drop LLE Deficits / Details: able to perform SLR, maintained KI for safety, ROM TBA   Cervical / Trunk Assessment Cervical / Trunk Assessment: Normal   Communication Communication Communication: No difficulties   Cognition Arousal/Alertness: Awake/alert Behavior During Therapy: WFL for tasks assessed/performed Overall Cognitive Status: Within Functional Limits for tasks assessed  General Comments       Exercises     Shoulder Instructions      Home Living Family/patient expects to be discharged to:: Private residence Living Arrangements: Parent Available Help at Discharge: Family Type of Home: House Home Access: Stairs to enter Technical brewer of Steps: 2 Entrance Stairs-Rails: None Home Layout: Able to live on main level with bedroom/bathroom     Bathroom Shower/Tub: Teacher, early years/pre: Standard Bathroom Accessibility: Yes How Accessible: Accessible via walker Home  Equipment: Gosper - 2 wheels;Bedside commode   Additional Comments: pt reports d/c home with his mother (as above)      Prior Functioning/Environment Level of Independence: Independent        Comments: on disability        OT Problem List: Decreased strength;Decreased range of motion;Impaired balance (sitting and/or standing);Decreased safety awareness;Decreased knowledge of use of DME or AE;Pain      OT Treatment/Interventions: Self-care/ADL training;DME and/or AE instruction;Therapeutic activities;Patient/family education    OT Goals(Current goals can be found in the care plan section) Acute Rehab OT Goals Patient Stated Goal: to go home OT Goal Formulation: With patient Time For Goal Achievement: 02/07/17 Potential to Achieve Goals: Good ADL Goals Pt Will Perform Lower Body Bathing: with set-up;sit to/from stand Pt Will Perform Lower Body Dressing: with set-up;sit to/from stand Pt Will Transfer to Toilet: with supervision;bedside commode;ambulating Pt Will Perform Tub/Shower Transfer: with min guard assist;rolling walker;3 in 1;Tub transfer;ambulating  OT Frequency: Min 2X/week   Barriers to D/C:            Co-evaluation              AM-PAC PT "6 Clicks" Daily Activity     Outcome Measure Help from another person eating meals?: None Help from another person taking care of personal grooming?: None Help from another person toileting, which includes using toliet, bedpan, or urinal?: A Little Help from another person bathing (including washing, rinsing, drying)?: A Little Help from another person to put on and taking off regular upper body clothing?: None Help from another person to put on and taking off regular lower body clothing?: A Little 6 Click Score: 21   End of Session Equipment Utilized During Treatment: Rolling walker;Left knee immobilizer CPM Left Knee CPM Left Knee: Off Additional Comments: Increase 10 degrees/day, 4 hours/day Nurse Communication:  Mobility status  Activity Tolerance: Patient tolerated treatment well Patient left: in bed;with call bell/phone within reach;with family/visitor present  OT Visit Diagnosis: Unsteadiness on feet (R26.81);Pain Pain - Right/Left: Left Pain - part of body: Knee                Time: 1101-1118 OT Time Calculation (min): 17 min Charges:  OT General Charges $OT Visit: 1 Procedure OT Evaluation $OT Eval Low Complexity: 1 Procedure G-Codes:     Clyda Smyth, OT/L  [phone] 01/31/2017  Cory Marks,HILLARY 01/31/2017, 11:26 AM

## 2017-01-31 NOTE — Progress Notes (Signed)
Physical Therapy Treatment Patient Details Name: Cory Marks MRN: [id number] DOB: 06-26-1961 Today's Date: 01/31/2017    History of Present Illness Pt is a 56 year old male s/p L TKA with hx signifcant for but not limited to Bells Palsy, neuropathy, R toe amputations, R foot drop (from high school football injury    PT Comments    Pt ambulated increased distance per his preference then assisted back to bed.  Follow Up Recommendations  Home health PT     Equipment Recommendations  None recommended by PT    Recommendations for Other Services       Precautions / Restrictions Precautions Precautions: Fall;Knee Precaution Comments: hx of toe amputations R foot and foot drop so pt wears special shoe, also reports fracture to sesamoid bone L foot and prefers shoe (states no restrictions) Required Braces or Orthoses: Knee Immobilizer - Left Restrictions Weight Bearing Restrictions: No Other Position/Activity Restrictions: WBAT    Mobility  Bed Mobility Overal bed mobility: Modified Independent Bed Mobility: Supine to Sit;Sit to Supine     Supine to sit: Supervision Sit to supine: Min assist   General bed mobility comments: pt self assisted L LE out of bed and assist for L LE onto bed only for better pain control/fatigue from ambulation  Transfers Overall transfer level: Needs assistance Equipment used: Rolling walker (2 wheeled) Transfers: Sit to/from Stand Sit to Stand: Min guard         General transfer comment: verbal cues for UE and LE positioning  Ambulation/Gait Ambulation/Gait assistance: Min guard Ambulation Distance (Feet): 320 Feet Assistive device: Rolling walker (2 wheeled) Gait Pattern/deviations: Step-to pattern;Decreased stance time - left;Antalgic     General Gait Details: verbal cues for sequence, RW positioning, weight bearing through RW, pt preferred increased distance again today, more fatigued upon returning to room requiring a couple  standing rest breaks   Stairs            Wheelchair Mobility    Modified Rankin (Stroke Patients Only)       Balance Overall balance assessment: Needs assistance   Sitting balance-Leahy Scale: Good       Standing balance-Leahy Scale: Fair                              Cognition Arousal/Alertness: Awake/alert Behavior During Therapy: WFL for tasks assessed/performed Overall Cognitive Status: Within Functional Limits for tasks assessed                                        Exercises      General Comments        Pertinent Vitals/Pain Pain Assessment: 0-10 Pain Score: 4  Pain Location: L knee Pain Descriptors / Indicators: Sore;Aching Pain Intervention(s): Limited activity within patient's tolerance;Monitored during session;Repositioned;Ice applied;Premedicated before session    [place] expects to be discharged to:: Private residence Living Arrangements: Parent Available Help at Discharge: Family Type of Home: House Home Access: Stairs to enter Entrance Stairs-Rails: None Home Layout: Able to live on main level with bedroom/bathroom Home Equipment: Environmental consultant - 2 wheels;Bedside commode Additional Comments: pt reports d/c home with his mother (as above)    Prior Function Level of Independence: Independent      Comments: on disability   PT Goals (current goals can now be found in the care plan section) Acute Rehab  PT Goals Patient Stated Goal: to go home Progress towards PT goals: Progressing toward goals    Frequency    7X/week      PT Plan Current plan remains appropriate    Co-evaluation              AM-PAC PT "6 Clicks" Daily Activity  Outcome Measure  Difficulty turning over in bed (including adjusting bedclothes, sheets and blankets)?: A Little Difficulty moving from lying on back to sitting on the side of the bed? : Total Difficulty sitting down on and standing up from a chair with  arms (e.g., wheelchair, bedside commode, etc,.)?: Total Help needed moving to and from a bed to chair (including a wheelchair)?: A Little Help needed walking in hospital room?: A Little Help needed climbing 3-5 steps with a railing? : A Little 6 Click Score: 14    End of Session Equipment Utilized During Treatment: Gait belt;Left knee immobilizer Activity Tolerance: Patient limited by fatigue Patient left: with call bell/phone within reach;in bed   PT Visit Diagnosis: Other abnormalities of gait and mobility (R26.89)     Time: 3893-7342 PT Time Calculation (min) (ACUTE ONLY): 24 min  Charges:  $Gait Training: 23-37 mins                    G Codes:       Carmelia Bake, PT, DPT 01/31/2017 Pager: [phone]   York Ram E 01/31/2017, 1:44 PM

## 2017-01-31 NOTE — Discharge Instructions (Signed)
°  ° °Dr. Frank Aluisio °Total Joint Specialist °[facility] °[address] °[place], [place] [address] °[phone] ° °TOTAL KNEE REPLACEMENT POSTOPERATIVE DIRECTIONS ° °Knee Rehabilitation, Guidelines Following Surgery  °Results after knee surgery are often greatly improved when you follow the exercise, range of motion and muscle strengthening exercises prescribed by your doctor. Safety measures are also important to protect the knee from further injury. Any time any of these exercises cause you to have increased pain or swelling in your knee joint, decrease the amount until you are comfortable again and slowly increase them. If you have problems or questions, call your caregiver or physical therapist for advice.  ° °HOME CARE INSTRUCTIONS  °Remove items at home which could result in a fall. This includes throw rugs or furniture in walking pathways.  °· ICE to the affected knee every three hours for 30 minutes at a time and then as needed for pain and swelling.  Continue to use ice on the knee for pain and swelling from surgery. You may notice swelling that will progress down to the foot and ankle.  This is normal after surgery.  Elevate the leg when you are not up walking on it.   °· Continue to use the breathing machine which will help keep your temperature down.  It is common for your temperature to cycle up and down following surgery, especially at night when you are not up moving around and exerting yourself.  The breathing machine keeps your lungs expanded and your temperature down. °· Do not place pillow under knee, focus on keeping the knee straight while resting ° °DIET °You may resume your previous home diet once your are discharged from the hospital. ° °DRESSING / WOUND CARE / SHOWERING °You may shower 3 days after surgery, but keep the wounds dry during showering.  You may use an occlusive plastic wrap (Press'n Seal for example), NO SOAKING/SUBMERGING IN THE BATHTUB.  If the  bandage gets wet, change with a clean dry gauze.  If the incision gets wet, pat the wound dry with a clean towel. °You may start showering once you are discharged home but do not submerge the incision under water. Just pat the incision dry and apply a dry gauze dressing on daily. °Change the surgical dressing daily and reapply a dry dressing each time. ° °ACTIVITY °Walk with your walker as instructed. °Use walker as long as suggested by your caregivers. °Avoid periods of inactivity such as sitting longer than an hour when not asleep. This helps prevent blood clots.  °You may resume a sexual relationship in one month or when given the OK by your doctor.  °You may return to work once you are cleared by your doctor.  °Do not drive a car for 6 weeks or until released by you surgeon.  °Do not drive while taking narcotics. ° °WEIGHT BEARING °Weight bearing as tolerated with assist device (walker, cane, etc) as directed, use it as long as suggested by your surgeon or therapist, typically at least 4-6 weeks. ° °POSTOPERATIVE CONSTIPATION PROTOCOL °Constipation - defined medically as fewer than three stools per week and severe constipation as less than one stool per week. ° °One of the most common issues patients have following surgery is constipation.  Even if you have a regular bowel pattern at home, your normal regimen is likely to be disrupted due to multiple reasons following surgery.  Combination of anesthesia, postoperative narcotics, change in appetite and fluid intake all can affect your   bowels.  In order to avoid complications following surgery, here are some recommendations in order to help you during your recovery period. ° °Colace (docusate) - Pick up an over-the-counter form of Colace or another stool softener and take twice a day as long as you are requiring postoperative pain medications.  Take with a full glass of water daily.  If you experience loose stools or diarrhea, hold the colace until you stool forms  back up.  If your symptoms do not get better within 1 week or if they get worse, check with your doctor. ° °Dulcolax (bisacodyl) - Pick up over-the-counter and take as directed by the product packaging as needed to assist with the movement of your bowels.  Take with a full glass of water.  Use this product as needed if not relieved by Colace only.  ° °MiraLax (polyethylene glycol) - Pick up over-the-counter to have on hand.  MiraLax is a solution that will increase the amount of water in your bowels to assist with bowel movements.  Take as directed and can mix with a glass of water, juice, soda, coffee, or tea.  Take if you go more than two days without a movement. °Do not use MiraLax more than once per day. Call your doctor if you are still constipated or irregular after using this medication for 7 days in a row. ° °If you continue to have problems with postoperative constipation, please contact the office for further assistance and recommendations.  If you experience "the worst abdominal pain ever" or develop nausea or vomiting, please contact the office immediatly for further recommendations for treatment. ° °ITCHING ° If you experience itching with your medications, try taking only a single pain pill, or even half a pain pill at a time.  You can also use Benadryl over the counter for itching or also to help with sleep.  ° °TED HOSE STOCKINGS °Wear the elastic stockings on both legs for three weeks following surgery during the day but you may remove then at night for sleeping. ° °MEDICATIONS °See your medication summary on the “After Visit Summary” that the nursing staff will review with you prior to discharge.  You may have some home medications which will be placed on hold until you complete the course of blood thinner medication.  It is important for you to complete the blood thinner medication as prescribed by your surgeon.  Continue your approved medications as instructed at time of  discharge. ° °PRECAUTIONS °If you experience chest pain or shortness of breath - call 911 immediately for transfer to the hospital emergency department.  °If you develop a fever greater that 101 F, purulent drainage from wound, increased redness or drainage from wound, foul odor from the wound/dressing, or calf pain - CONTACT YOUR SURGEON.   °                                                °FOLLOW-UP APPOINTMENTS °Make sure you keep all of your appointments after your operation with your surgeon and caregivers. You should call the office at the above phone number and make an appointment for approximately two weeks after the date of your surgery or on the date instructed by your surgeon outlined in the "After Visit Summary". ° ° °RANGE OF MOTION AND STRENGTHENING EXERCISES  °Rehabilitation of the knee is important following a knee   injury or an operation. After just a few days of immobilization, the muscles of the thigh which control the knee become weakened and shrink (atrophy). Knee exercises are designed to build up the tone and strength of the thigh muscles and to improve knee motion. Often times heat used for twenty to thirty minutes before working out will loosen up your tissues and help with improving the range of motion but do not use heat for the first two weeks following surgery. These exercises can be done on a training (exercise) mat, on the floor, on a table or on a bed. Use what ever works the best and is most comfortable for you Knee exercises include:  °Leg Lifts - While your knee is still immobilized in a splint or cast, you can do straight leg raises. Lift the leg to 60 degrees, hold for 3 sec, and slowly lower the leg. Repeat 10-20 times 2-3 times daily. Perform this exercise against resistance later as your knee gets better.  °Quad and Hamstring Sets - Tighten up the muscle on the front of the thigh (Quad) and hold for 5-10 sec. Repeat this 10-20 times hourly. Hamstring sets are done by pushing the  foot backward against an object and holding for 5-10 sec. Repeat as with quad sets.  °· Leg Slides: Lying on your back, slowly slide your foot toward your buttocks, bending your knee up off the floor (only go as far as is comfortable). Then slowly slide your foot back down until your leg is flat on the floor again. °· Angel Wings: Lying on your back spread your legs to the side as far apart as you can without causing discomfort.  °A rehabilitation program following serious knee injuries can speed recovery and prevent re-injury in the future due to weakened muscles. Contact your doctor or a physical therapist for more information on knee rehabilitation.  ° °IF YOU ARE TRANSFERRED TO A SKILLED REHAB FACILITY °If the patient is transferred to a skilled rehab facility following release from the hospital, a list of the current medications will be sent to the facility for the patient to continue.  When discharged from the skilled rehab facility, please have the facility set up the patient's Home Health Physical Therapy prior to being released. Also, the skilled facility will be responsible for providing the patient with their medications at time of release from the facility to include their pain medication, the muscle relaxants, and their blood thinner medication. If the patient is still at the rehab facility at time of the two week follow up appointment, the skilled rehab facility will also need to assist the patient in arranging follow up appointment in our office and any transportation needs. ° °MAKE SURE YOU:  °Understand these instructions.  °Get help right away if you are not doing well or get worse.  ° ° °Pick up stool softner and laxative for home use following surgery while on pain medications. °Do not submerge incision under water. °Please use good hand washing techniques while changing dressing each day. °May shower starting three days after surgery. °Please use a clean towel to pat the incision dry following  showers. °Continue to use ice for pain and swelling after surgery. °Do not use any lotions or creams on the incision until instructed by your surgeon. ° °Take Xarelto for two and a half more weeks following discharge from the hospital, then discontinue Xarelto. °Once the patient has completed the blood thinner regimen, then take a Baby 81 mg Aspirin daily   for three more weeks. ° ° °Information on my medicine - XARELTO® (Rivaroxaban) ° ° °Why was Xarelto® prescribed for you? °Xarelto® was prescribed for you to reduce the risk of blood clots forming after orthopedic surgery. The medical term for these abnormal blood clots is venous thromboembolism (VTE). ° °What do you need to know about xarelto® ? °Take your Xarelto® ONCE DAILY at the same time every day. °You may take it either with or without food. ° °If you have difficulty swallowing the tablet whole, you may crush it and mix in applesauce just prior to taking your dose. ° °Take Xarelto® exactly as prescribed by your doctor and DO NOT stop taking Xarelto® without talking to the doctor who prescribed the medication.  Stopping without other VTE prevention medication to take the place of Xarelto® may increase your risk of developing a clot. ° °After discharge, you should have regular check-up appointments with your healthcare provider that is prescribing your Xarelto®.   ° °What do you do if you miss a dose? °If you miss a dose, take it as soon as you remember on the same day then continue your regularly scheduled once daily regimen the next day. Do not take two doses of Xarelto® on the same day.  ° °Important Safety Information °A possible side effect of Xarelto® is bleeding. You should call your healthcare provider right away if you experience any of the following: °? Bleeding from an injury or your nose that does not stop. °? Unusual colored urine (red or dark brown) or unusual colored stools (red or black). °? Unusual bruising for unknown reasons. °? A serious  fall or if you hit your head (even if there is no bleeding). ° °Some medicines may interact with Xarelto® and might increase your risk of bleeding while on Xarelto®. To help avoid this, consult your healthcare provider or pharmacist prior to using any new prescription or non-prescription medications, including herbals, vitamins, non-steroidal anti-inflammatory drugs (NSAIDs) and supplements. ° °This website has more information on Xarelto®: www.xarelto.com. ° ° °

## 2017-02-01 LAB — BASIC METABOLIC PANEL
Anion gap: 7 (ref 5–15)
BUN: 27 mg/dL — ABNORMAL HIGH (ref 6–20)
CHLORIDE: 99 mmol/L — AB (ref 101–111)
CO2: 29 mmol/L (ref 22–32)
Calcium: 9.3 mg/dL (ref 8.9–10.3)
Creatinine, Ser: 1.27 mg/dL — ABNORMAL HIGH (ref 0.61–1.24)
GFR calc Af Amer: >60 mL/min (ref >60)
GFR calc non Af Amer: >60 mL/min (ref >60)
Glucose, Bld: 99 mg/dL (ref 65–99)
Potassium: 4.2 mmol/L (ref 3.5–5.1)
Sodium: 135 mmol/L (ref 135–145)

## 2017-02-01 LAB — CBC
HEMATOCRIT: 31.1 % — AB (ref 39.0–52.0)
HEMOGLOBIN: 10.8 g/dL — AB (ref 13.0–17.0)
MCH: 32.7 pg (ref 26.0–34.0)
MCHC: 34.7 g/dL (ref 30.0–36.0)
MCV: 94.2 fL (ref 78.0–100.0)
Platelets: 168 10*3/uL (ref 150–400)
RBC: 3.3 MIL/uL — ABNORMAL LOW (ref 4.22–5.81)
RDW: 13.3 % (ref 11.5–15.5)
WBC: 13.3 10*3/uL — ABNORMAL HIGH (ref 4.0–10.5)

## 2017-02-01 LAB — GLUCOSE, CAPILLARY
GLUCOSE-CAPILLARY: 78 mg/dL (ref 65–99)
Glucose-Capillary: 78 mg/dL (ref 65–99)

## 2017-02-01 NOTE — Progress Notes (Signed)
Occupational Therapy Treatment Patient Details Name: Cory Marks MRN: [id number] DOB: [birth year] Today's Date: 02/01/2017    History of present illness Pt is a 56 year old male s/p L TKA with hx signifcant for but not limited to Bells Palsy, neuropathy, R toe amputations, R foot drop (from high school football injury   OT comments  Wife will A as needed  Follow Up Recommendations  No OT follow up;Supervision - Intermittent    Equipment Recommendations  None recommended by OT    Recommendations for Other Services      Precautions / Restrictions Precautions Precautions: Fall;Knee Precaution Comments: hx of toe amputations R foot and foot drop so pt wears special shoe, also reports fracture to sesamoid bone L foot and prefers shoe (states no restrictions) Required Braces or Orthoses: Knee Immobilizer - Left Restrictions Weight Bearing Restrictions: No Other Position/Activity Restrictions: WBAT       Mobility Bed Mobility               General bed mobility comments: pt in chair  Transfers Overall transfer level: Needs assistance Equipment used: Rolling walker (2 wheeled) Transfers: Sit to/from [facility] Sit to Stand: Supervision Stand pivot transfers: Supervision       General transfer comment: verbal cues for UE and LE positioning    Balance                                           ADL either performed or assessed with clinical judgement   ADL Overall ADL's : Needs assistance/impaired                     Lower Body Dressing: Minimal assistance;Sit to/from stand;Cueing for safety;Cueing for sequencing   Toilet Transfer: Supervision/safety;RW;Ambulation;Cueing for safety;Comfort height toilet   Toileting- Clothing Manipulation and Hygiene: Supervision/safety;Sit to/from stand;Cueing for safety;Cueing for sequencing     Tub/Shower Transfer Details (indicate cue type and reason): verbalized safety   General  ADL Comments: Wife will A as needed.  Pt opted for tub seat. Educated on use.     Vision Patient Visual Report: No change from baseline     Perception     Praxis                       Pertinent Vitals/ Pain             Prior Functioning/Environment              Frequency  Min 2X/week        Progress Toward Goals  OT Goals(current goals can now be found in the care plan section)  Progress towards OT goals: Progressing toward goals     Plan Discharge plan remains appropriate    Co-evaluation                 AM-PAC PT "6 Clicks" Daily Activity     Outcome Measure   Help from another person eating meals?: None Help from another person taking care of personal grooming?: None Help from another person toileting, which includes using toliet, bedpan, or urinal?: A Little Help from another person bathing (including washing, rinsing, drying)?: A Little Help from another person to put on and taking off regular upper body clothing?: None Help from another person to put on and taking off regular lower body clothing?: A Little 6  Click Score: 21    End of Session Equipment Utilized During Treatment: Rolling walker;Left knee immobilizer CPM Left Knee CPM Left Knee: Off  OT Visit Diagnosis: Unsteadiness on feet (R26.81);Pain Pain - Right/Left: Left Pain - part of body: Knee   Activity Tolerance Patient tolerated treatment well   Patient Left in bed;with call bell/phone within reach;with family/visitor present   Nurse Communication Mobility status        Time: 1045-1105 OT Time Calculation (min): 20 min  Charges: OT General Charges $OT Visit: 1 Procedure OT Treatments $Self Care/Home Management : 8-22 mins  Kari Baars, [place]   Payton Mccallum D 02/01/2017, 11:21 AM

## 2017-02-01 NOTE — Progress Notes (Signed)
Physical Therapy Treatment Patient Details Name: Cory Marks MRN: [id number] DOB: Feb 03, 1961 Today's Date: 02/01/2017    History of Present Illness Pt is a 56 year old male s/p L TKA with hx signifcant for but not limited to Bells Palsy, neuropathy, R toe amputations, R foot drop (from high school football injury    PT Comments    Pt ambulated in hallway and practiced safe stair technique.  Pt reports his cousin will assist him home today so provided handout for steps.  Pt also performed LE exercises and provided with HEP handout.  Pt had no further questions and feels ready for d/c home later today.   Follow Up Recommendations  Home health PT     Equipment Recommendations  None recommended by PT    Recommendations for Other Services       Precautions / Restrictions Precautions Precautions: Fall;Knee Precaution Comments: hx of toe amputations R foot and foot drop so pt wears special shoe, also reports fracture to sesamoid bone L foot and prefers shoe (states no restrictions) Required Braces or Orthoses: Knee Immobilizer - Left Restrictions Weight Bearing Restrictions: No Other Position/Activity Restrictions: WBAT    Mobility  Bed Mobility Overal bed mobility: Needs Assistance Bed Mobility: Supine to Sit     Supine to sit: Supervision     General bed mobility comments: pt self assisted L LE  Transfers Overall transfer level: Needs assistance Equipment used: Rolling walker (2 wheeled) Transfers: Sit to/from Stand Sit to Stand: Supervision Stand pivot transfers: Supervision       General transfer comment: verbal cues for UE and LE positioning  Ambulation/Gait Ambulation/Gait assistance: Min guard;Supervision Ambulation Distance (Feet): 200 Feet Assistive device: Rolling walker (2 wheeled) Gait Pattern/deviations: Step-to pattern;Decreased stance time - left;Antalgic     General Gait Details: verbal cues for RW positioning, posture   Stairs Stairs:  Yes   Stair Management: Step to pattern;Backwards;With walker Number of Stairs: 3 General stair comments: verbal cues for sequence, safety, RW positioning, performed twice, provided handout  Wheelchair Mobility    Modified Rankin (Stroke Patients Only)       Balance                                            Cognition Arousal/Alertness: Awake/alert Behavior During Therapy: WFL for tasks assessed/performed Overall Cognitive Status: Within Functional Limits for tasks assessed                                        Exercises Total Joint Exercises Ankle Circles/Pumps: AROM;10 reps;Both Quad Sets: AROM;Both;10 reps Short Arc QuadSinclair Ship;Left;10 reps Heel Slides: AAROM;Right;10 reps Hip ABduction/ADduction: AROM;Left;10 reps Straight Leg Raises: AAROM;Left;10 reps    General Comments        Pertinent Vitals/Pain Pain Assessment: 0-10 Pain Score: 5  Pain Location: L knee Pain Descriptors / Indicators: Sore;Aching Pain Intervention(s): Limited activity within patient's tolerance;Repositioned;Monitored during session;Ice applied    Home Living                      Prior Function            PT Goals (current goals can now be found in the care plan section) Progress towards PT goals: Progressing toward goals    Frequency  7X/week      PT Plan Current plan remains appropriate    Co-evaluation              AM-PAC PT "6 Clicks" Daily Activity  Outcome Measure  Difficulty turning over in bed (including adjusting bedclothes, sheets and blankets)?: None Difficulty moving from lying on back to sitting on the side of the bed? : None Difficulty sitting down on and standing up from a chair with arms (e.g., wheelchair, bedside commode, etc,.)?: A Little Help needed moving to and from a bed to chair (including a wheelchair)?: A Little Help needed walking in hospital room?: A Little Help needed climbing 3-5 steps with  a railing? : A Little 6 Click Score: 20    End of Session Equipment Utilized During Treatment: Left knee immobilizer Activity Tolerance: Patient tolerated treatment well Patient left: in bed;with call bell/phone within reach Nurse Communication: Mobility status PT Visit Diagnosis: Other abnormalities of gait and mobility (R26.89)     Time: 0315-9458 PT Time Calculation (min) (ACUTE ONLY): 26 min  Charges:  $Gait Training: 8-22 mins $Therapeutic Exercise: 8-22 mins                    G Codes:       Carmelia Bake, PT, DPT 02/01/2017 Pager: [phone]  York Ram E 02/01/2017, 12:02 PM

## 2017-02-02 NOTE — Progress Notes (Signed)
   LATE ENTRY NOTE Date of Service of Visit - 02/01/2017  Subjective: 2 Days Post-Op Procedure(s) (LRB): LEFT TOTAL KNEE ARTHROPLASTY (Left) Patient reports pain as mild.   Patient seen in rounds for Dr. Wynelle Link. Patient is well, but has had some minor complaints of pain in the knee, requiring pain medications Patient is ready to go home  Objective: Vital signs in last 24 hours:    Intake/Output from previous day: No intake or output data in the 24 hours ending 02/02/17 0927  Intake/Output this shift: Total I/O In: 1230 [P.O.:1230] Out: 3445 [Urine:3445]  Labs:  Recent Labs  01/31/17 0416 02/01/17 0421  HGB 11.7* 10.8*    Recent Labs  01/31/17 0416 02/01/17 0421  WBC 10.7* 13.3*  RBC 3.59* 3.30*  HCT 34.3* 31.1*  PLT 154 168    Recent Labs  01/31/17 0416 02/01/17 0421  NA 137 135  K 4.5 4.2  CL 106 99*  CO2 24 29  BUN 30* 27*  CREATININE 1.42* 1.27*  GLUCOSE 133* 99  CALCIUM 8.7* 9.3   No results for input(s): LABPT, INR in the last 72 hours.  EXAM: General - Patient is Alert, Appropriate and Oriented Extremity - Neurovascular intact Sensation intact distally Intact pulses distally Dorsiflexion/Plantar flexion intact Incision - clean, scant drainage for the distal tip of the incision. Motor Function - intact, moving foot and toes well on exam.   Assessment/Plan: 3 Days Post-Op Procedure(s) (LRB): LEFT TOTAL KNEE ARTHROPLASTY (Left) Procedure(s) (LRB): LEFT TOTAL KNEE ARTHROPLASTY (Left) Past Medical History:  Diagnosis Date  . Arthritis   . Bell's palsy   . CAD (coronary artery disease)   . Cancer [facility]) 2012   Mantle Cell Cancer- S/P stem cell transplant  . CKD (chronic kidney disease) stage 3, GFR 30-59 ml/min   . Diabetes mellitus without complication (HCC)    hx of lost weight due to cancer not on medications since 2014   . Dysrhythmia    atrial fib and atrial flutter  . History of chemotherapy last tx 2014  . History of diabetes  mellitus NONE SINCE 2012 CANCER  . History of splenectomy   . Hyperlipemia   . Hypertension   . Neuromuscular disorder ([place])    peripheral neuropathy  . Neuropathy    right leg due to football injury  . Polysubstance abuse   . Sinus bradycardia    Principal Problem:   OA (osteoarthritis) of knee  Estimated body mass index is 27.53 kg/m as calculated from the following:   Height as of this encounter: 6' (1.829 m).   Weight as of this encounter: 92.1 kg (203 lb). Up with therapy Discharge home with home health Diet - Cardiac diet, Diabetic diet and Renal diet Follow up - in 2 weeks Activity - WBAT Disposition - Home Condition Upon Discharge - Stable D/C Meds - See DC Summary DVT Prophylaxis - Xarelto  Arlee Muslim, PA-C Orthopaedic Surgery 02/02/2017, 9:27 AM

## 2017-02-03 ENCOUNTER — Ambulatory Visit (INDEPENDENT_AMBULATORY_CARE_PROVIDER_SITE_OTHER): Payer: Medicare PPO | Admitting: Family Medicine

## 2017-02-03 ENCOUNTER — Encounter: Payer: Self-pay | Admitting: Family Medicine

## 2017-02-03 VITALS — BP 105/77 | HR 76 | Temp 98.7°F | Resp 16 | Ht 72.0 in | Wt 212.0 lb

## 2017-02-03 DIAGNOSIS — E118 Type 2 diabetes mellitus with unspecified complications: Secondary | ICD-10-CM | POA: Diagnosis not present

## 2017-02-03 DIAGNOSIS — L299 Pruritus, unspecified: Secondary | ICD-10-CM

## 2017-02-03 DIAGNOSIS — K5900 Constipation, unspecified: Secondary | ICD-10-CM

## 2017-02-03 DIAGNOSIS — N183 Chronic kidney disease, stage 3 unspecified: Secondary | ICD-10-CM

## 2017-02-03 DIAGNOSIS — E785 Hyperlipidemia, unspecified: Secondary | ICD-10-CM

## 2017-02-03 LAB — POCT URINALYSIS DIP (DEVICE)
Bilirubin Urine: NEGATIVE
GLUCOSE, UA: NEGATIVE mg/dL
HGB URINE DIPSTICK: NEGATIVE
KETONES UR: NEGATIVE mg/dL
Leukocytes, UA: NEGATIVE
Nitrite: NEGATIVE
PH: 6 (ref 5.0–8.0)
PROTEIN: NEGATIVE mg/dL
SPECIFIC GRAVITY, URINE: 1.02 (ref 1.005–1.030)
UROBILINOGEN UA: 0.2 mg/dL (ref 0.0–1.0)

## 2017-02-03 MED ORDER — DIPHENHYDRAMINE HCL 25 MG PO CAPS: 25.0000 mg | ORAL_CAPSULE | Freq: Four times a day (QID) | ORAL | 0 refills | Status: DC | PRN

## 2017-02-03 MED ORDER — DOCUSATE SODIUM 100 MG PO CAPS: 100.0000 mg | ORAL_CAPSULE | Freq: Two times a day (BID) | ORAL | 0 refills | Status: DC

## 2017-02-03 NOTE — Progress Notes (Signed)
Subjective:    Patient ID: Cory Marks, male    DOB: 1961-05-01, 56 y.o.   MRN: [id number]  Cory Marks, a 56 year old male presents for a follow up of chronic conditions. Cory Marks underwent a left total knee replacement surgery on 01/28/2017. He says that surgery went well. He has a follow up appointment scheduled in 1 week.  He says that he feels well and has minimal complaints. He says that he has been taking all medications consistently.    Hypertension  This is a recurrent problem. The current episode started 1 to 4 weeks ago. The problem has been gradually improving since onset. The problem is controlled. Pertinent negatives include no anxiety, blurred vision, malaise/fatigue, palpitations, peripheral edema or shortness of breath. Risk factors for coronary artery disease include diabetes mellitus. Past treatments include ACE inhibitors and calcium channel blockers. The current treatment provides significant improvement. There are no compliance problems.  Hypertensive end-organ damage includes kidney disease. Identifiable causes of hypertension include chronic renal disease. There is no history of sleep apnea or a thyroid problem.  Diabetes  He presents for his follow-up diabetic visit. He has type 2 diabetes mellitus. No MedicAlert identification noted. His disease course has been improving. Pertinent negatives for hypoglycemia include no dizziness. Associated symptoms include foot paresthesias. Pertinent negatives for diabetes include no blurred vision, no foot ulcerations, no polydipsia, no polyphagia and no polyuria. Symptoms are stable. Risk factors for coronary artery disease include diabetes mellitus, dyslipidemia and male sex. He participates in exercise intermittently. An ACE inhibitor/angiotensin II receptor blocker is being taken. He does not see a podiatrist.Eye exam is current.  Hyperlipidemia  This is a recurrent problem. The problem is uncontrolled. Recent lipid tests were  reviewed and are high. Exacerbating diseases include chronic renal disease and diabetes. He has no history of hypothyroidism, liver disease, obesity or nephrotic syndrome. There are no known factors aggravating his hyperlipidemia. Pertinent negatives include no shortness of breath. Current antihyperlipidemic treatment includes statins. The current treatment provides mild improvement of lipids. There are no compliance problems.  Risk factors for coronary artery disease include dyslipidemia, hypertension and diabetes mellitus.   Past Medical History:  Diagnosis Date  . Arthritis   . Bell's palsy   . CAD (coronary artery disease)   . Cancer [facility]) 2012   Mantle Cell Cancer- S/P stem cell transplant  . CKD (chronic kidney disease) stage 3, GFR 30-59 ml/min   . Diabetes mellitus without complication (HCC)    hx of lost weight due to cancer not on medications since 2014   . Dysrhythmia    atrial fib and atrial flutter  . History of chemotherapy last tx 2014  . History of diabetes mellitus NONE SINCE 2012 CANCER  . History of splenectomy   . Hyperlipemia   . Hypertension   . Neuromuscular disorder ([place])    peripheral neuropathy  . Neuropathy    right leg due to football injury  . Polysubstance abuse   . Sinus bradycardia    Social History   Social History  . Marital status: Married    Spouse name: N/A  . Number of children: N/A  . Years of education: N/A   Occupational History  . Not on file.   Social History Main Topics  . Smoking status: Never Smoker  . Smokeless tobacco: Never Used  . Alcohol use 0.6 oz/week    1 Cans of beer per week     Comment: occasional   . Drug  use: No     Comment: last use few years ago per patient - used marijuana   . Sexual activity: Yes    Partners: Female   Other Topics Concern  . Not on file   Social History Narrative  . No narrative on file   Allergies  Allergen Reactions  . Oxycodone Itching and Palpitations    Immunization History   Administered Date(s) Administered  . DTaP 05/30/2013  . Influenza,inj,Quad PF,36+ Mos 05/30/2014, 06/15/2015, 03/29/2016  . MMR 05/30/2013  . Pneumococcal Conjugate-13 12/11/2014  . Pneumococcal-Unspecified 05/30/2013  . Tdap 05/30/2013   Review of Systems  Constitutional: Negative.  Negative for malaise/fatigue.  HENT: Negative.   Eyes: Negative.  Negative for blurred vision.  Respiratory: Negative.  Negative for shortness of breath.   Cardiovascular: Negative.  Negative for palpitations and leg swelling.  Gastrointestinal: Negative.  Negative for constipation.  Endocrine: Negative.  Negative for polydipsia, polyphagia and polyuria.  Genitourinary: Negative.  Negative for hematuria.  Musculoskeletal:       Previous amputation to right toes.   Left Knee pain-s/p total knee pain  Skin: Negative.  Negative for wound.  Allergic/Immunologic: Negative.   Neurological: Negative.  Negative for dizziness.  Hematological: Negative.   Psychiatric/Behavioral: Negative.  Negative for sleep disturbance.       Objective:   Physical Exam  Constitutional: He is oriented to person, place, and time. He appears well-developed and well-nourished.  HENT:  Head: Normocephalic and atraumatic.  Right Ear: External ear normal.  Left Ear: External ear normal.  Mouth/Throat: Oropharynx is clear and moist.  Eyes: Conjunctivae and EOM are normal. Pupils are equal, round, and reactive to light.  Neck: Normal range of motion. Neck supple.  Cardiovascular: Normal rate, regular rhythm, normal heart sounds and intact distal pulses.   Pulmonary/Chest: Effort normal and breath sounds normal.  Abdominal: Soft. Bowel sounds are normal.  Musculoskeletal: Normal range of motion.  Bandage to left knee, clean and dry  Neurological: He is alert and oriented to person, place, and time. He has normal reflexes.   Previous amputations to 1st, 2nd, and 3rd toes of right foot.   Skin: Skin is warm and dry.   Psychiatric: He has a normal mood and affect. His behavior is normal. Judgment and thought content normal.      BP 105/77 (BP Location: Right Arm, Patient Position: Sitting, Cuff Size: Normal)   Pulse 76   Temp 98.7 F (37.1 C) (Oral)   Resp 16   Ht 6' (1.829 m)   Wt 212 lb (96.2 kg)   SpO2 98%   BMI 28.75 kg/m   Assessment & Plan:  1. Controlled type 2 diabetes mellitus with complication, without long-term current use of insulin (HCC) Diabetes has resolved due to weight loss. He does not warrant medication at this time. Continue a carbohydrate modified diet as discussed.   2. Hyperlipidemia, unspecified hyperlipidemia type The patient is asked to make an attempt to improve diet  to aid in medical management of this problem. The 10-year ASCVD risk score Mikey Bussing DC Brooke Bonito., et al., 2013) is: 14.5%   Values used to calculate the score:     Age: 89 years     Sex: Male     Is Non-Hispanic African American: Yes     Diabetic: Yes     Tobacco smoker: No     Systolic Blood Pressure: 539 mmHg     Is BP treated: Yes     HDL Cholesterol: 73 mg/dL  Total Cholesterol: 283 mg/dL  - Lipid Panel; Future  3. CKD (chronic kidney disease) stage 3, GFR 30-59 ml/min - COMPLETE METABOLIC PANEL WITH GFR; Future  4. Constipation, unspecified constipation type A high fiber diet with plenty of fluids (up to 8 glasses of water daily) is suggested to relieve these symptoms.   - docusate sodium (COLACE) 100 MG capsule; Take 1 capsule (100 mg total) by mouth 2 (two) times daily.  Dispense: 30 capsule; Refill: 0  5. Pruritus He is s/p left total knee replacement and was prescribed opiate medications. He is complaining of itching after taking medications.  - diphenhydrAMINE (BENADRYL) 25 mg capsule; Take 1 capsule (25 mg total) by mouth every 6 (six) hours as needed for itching.  Dispense: 30 capsule; Refill: 0   RTC: 3 months for chronic conditions   Donia Pounds  MSN, FNP-C [facility] [address] [place], [place] [address] [phone]

## 2017-02-03 NOTE — Patient Instructions (Signed)
Constipation:  Increase water intake to 4-5 glasses per day. A high fiber diet with plenty of fluids (up to 8 glasses of water daily) is suggested to relieve these symptoms.    Colace 100 mg twice daily for 10 day   Itching: Benadryl 12.5 mg every 6 hours as needed   Resume antihypertensive medications as scheduled.

## 2017-02-22 ENCOUNTER — Other Ambulatory Visit: Payer: Self-pay

## 2017-02-22 MED ORDER — LISINOPRIL 20 MG PO TABS: 20.0000 mg | ORAL_TABLET | Freq: Every day | ORAL | 3 refills | Status: DC

## 2017-02-22 NOTE — Telephone Encounter (Signed)
Refill for lisinopril sent into pharmacy. Thanks!  

## 2017-02-27 ENCOUNTER — Other Ambulatory Visit: Payer: Self-pay

## 2017-02-27 MED ORDER — ATORVASTATIN CALCIUM 40 MG PO TABS: 40.0000 mg | ORAL_TABLET | Freq: Every day | ORAL | 1 refills | Status: DC

## 2017-02-27 NOTE — Telephone Encounter (Signed)
Refill for atorvastatin sent into mail order pharmacy. Thanks!

## 2017-03-06 ENCOUNTER — Other Ambulatory Visit: Payer: Medicare PPO

## 2017-03-06 ENCOUNTER — Other Ambulatory Visit: Payer: Self-pay

## 2017-03-06 DIAGNOSIS — N183 Chronic kidney disease, stage 3 unspecified: Secondary | ICD-10-CM

## 2017-03-06 DIAGNOSIS — E785 Hyperlipidemia, unspecified: Secondary | ICD-10-CM

## 2017-03-06 LAB — COMPLETE METABOLIC PANEL WITH GFR
ALBUMIN: 4 g/dL (ref 3.6–5.1)
ALK PHOS: 56 U/L (ref 40–115)
ALT: 11 U/L (ref 9–46)
AST: 13 U/L (ref 10–35)
BUN: 28 mg/dL — AB (ref 7–25)
CO2: 25 mmol/L (ref 20–31)
Calcium: 9.7 mg/dL (ref 8.6–10.3)
Chloride: 104 mmol/L (ref 98–110)
Creat: 1.7 mg/dL — ABNORMAL HIGH (ref 0.70–1.33)
GFR, EST NON AFRICAN AMERICAN: 44 mL/min — AB (ref >=60)
GFR, Est African American: 51 mL/min — ABNORMAL LOW (ref >=60)
Glucose, Bld: 78 mg/dL (ref 65–99)
Potassium: 4.1 mmol/L (ref 3.5–5.3)
Sodium: 140 mmol/L (ref 135–146)
TOTAL PROTEIN: 5.7 g/dL — AB (ref 6.1–8.1)
Total Bilirubin: 0.5 mg/dL (ref 0.2–1.2)

## 2017-03-06 LAB — LIPID PANEL
Cholesterol: 178 mg/dL (ref <200)
HDL: 64 mg/dL (ref >40)
LDL Cholesterol: 92 mg/dL (ref <100)
Total CHOL/HDL Ratio: 2.8 Ratio (ref <5.0)
Triglycerides: 110 mg/dL (ref <150)
VLDL: 22 mg/dL (ref <30)

## 2017-03-06 MED ORDER — ATORVASTATIN CALCIUM 40 MG PO TABS: 40.0000 mg | ORAL_TABLET | Freq: Every day | ORAL | 1 refills | Status: DC

## 2017-05-08 ENCOUNTER — Encounter: Payer: Self-pay | Admitting: Family Medicine

## 2017-05-08 ENCOUNTER — Ambulatory Visit (INDEPENDENT_AMBULATORY_CARE_PROVIDER_SITE_OTHER): Payer: Medicare PPO | Admitting: Family Medicine

## 2017-05-08 VITALS — BP 124/84 | HR 48 | Temp 97.7°F | Resp 16 | Ht 72.0 in | Wt 207.0 lb

## 2017-05-08 DIAGNOSIS — G8929 Other chronic pain: Secondary | ICD-10-CM

## 2017-05-08 DIAGNOSIS — G629 Polyneuropathy, unspecified: Secondary | ICD-10-CM | POA: Diagnosis not present

## 2017-05-08 DIAGNOSIS — I10 Essential (primary) hypertension: Secondary | ICD-10-CM | POA: Diagnosis not present

## 2017-05-08 DIAGNOSIS — S4992XA Unspecified injury of left shoulder and upper arm, initial encounter: Secondary | ICD-10-CM

## 2017-05-08 DIAGNOSIS — Z23 Encounter for immunization: Secondary | ICD-10-CM

## 2017-05-08 DIAGNOSIS — M25512 Pain in left shoulder: Secondary | ICD-10-CM | POA: Diagnosis not present

## 2017-05-08 LAB — POCT URINALYSIS DIP (DEVICE)
Bilirubin Urine: NEGATIVE
GLUCOSE, UA: NEGATIVE mg/dL
Hgb urine dipstick: NEGATIVE
Ketones, ur: NEGATIVE mg/dL
Leukocytes, UA: NEGATIVE
Nitrite: NEGATIVE
PROTEIN: NEGATIVE mg/dL
SPECIFIC GRAVITY, URINE: 1.02 (ref 1.005–1.030)
Urobilinogen, UA: 0.2 mg/dL (ref 0.0–1.0)
pH: 6 (ref 5.0–8.0)

## 2017-05-08 LAB — BASIC METABOLIC PANEL WITH GFR
BUN / CREAT RATIO: 16 (calc) (ref 6–22)
BUN: 23 mg/dL (ref 7–25)
CO2: 26 mmol/L (ref 20–32)
CREATININE: 1.42 mg/dL — AB (ref 0.70–1.33)
Calcium: 9.6 mg/dL (ref 8.6–10.3)
Chloride: 102 mmol/L (ref 98–110)
GFR, Est African American: 63 mL/min/{1.73_m2} (ref >=60)
GFR, Est Non African American: 54 mL/min/{1.73_m2} — ABNORMAL LOW (ref >=60)
GLUCOSE: 84 mg/dL (ref 65–99)
Potassium: 4.5 mmol/L (ref 3.5–5.3)
Sodium: 138 mmol/L (ref 135–146)

## 2017-05-08 MED ORDER — ACETAMINOPHEN-CODEINE 300-30 MG PO TABS: 1.0000 | ORAL_TABLET | Freq: Four times a day (QID) | ORAL | 0 refills | Status: DC | PRN

## 2017-05-08 MED ORDER — PREGABALIN 75 MG PO CAPS: 75.0000 mg | ORAL_CAPSULE | Freq: Two times a day (BID) | ORAL | 2 refills | Status: DC

## 2017-05-08 NOTE — Progress Notes (Signed)
Subjective:    Patient ID: Cory Marks, male    DOB: November 10, 1960, 56 y.o.   MRN: [id number]  HPI Cory Marks, a 56 year old male with a history of hypertension, type 2 diabetes, and CHF presents for a follow up of chronic conditions. Patient is also status post left total knee replacement on 01/30/2017. Cory Marks has been taking all antihypertensive medications consistently. He does not exercise routinely and has been following a low sodium diet. He does not check blood pressure at home. He denies chest pain, dizziness, headache, syncope, or bilateral lower extremity swelling.   Cory Marks also complains of left shoulder pain. He does not recall  This is evaluated as a personal injury. The pain is described as throbbing.  The onset of the pain was sudden, related to self injury. Mechanism of injury: gripping, lifting and reaching.  The pain occurs continuously .   Symptoms are aggravated by lifting overhead. Daily activities have been limited due to left shoulder pain. Current pain intensity is 9/10. He has taken OTC Aleve with minimal relief.   Past Medical History:  Diagnosis Date  . Arthritis   . Bell's palsy   . CAD (coronary artery disease)   . Cancer [facility]) 2012   Mantle Cell Cancer- S/P stem cell transplant  . CKD (chronic kidney disease) stage 3, GFR 30-59 ml/min (HCC)   . Diabetes mellitus without complication (HCC)    hx of lost weight due to cancer not on medications since 2014   . Dysrhythmia    atrial fib and atrial flutter  . History of chemotherapy last tx 2014  . History of diabetes mellitus NONE SINCE 2012 CANCER  . History of splenectomy   . Hyperlipemia   . Hypertension   . Neuromuscular disorder ([place])    peripheral neuropathy  . Neuropathy    right leg due to football injury  . Polysubstance abuse ([place])   . Sinus bradycardia    Social History   Social History  . Marital status: Married    Spouse name: N/A  . Number of children: N/A  . Years of  education: N/A   Occupational History  . Not on file.   Social History Main Topics  . Smoking status: Never Smoker  . Smokeless tobacco: Never Used  . Alcohol use 0.6 oz/week    1 Cans of beer per week     Comment: occasional   . Drug use: No     Comment: last use few years ago per patient - used marijuana   . Sexual activity: Yes    Partners: Female   Other Topics Concern  . Not on file   Social History Narrative  . No narrative on file   Allergies  Allergen Reactions  . Oxycodone Itching and Palpitations   Immunization History  Administered Date(s) Administered  . DTaP 05/30/2013  . Influenza,inj,Quad PF,6+ Mos 05/30/2014, 06/15/2015, 03/29/2016, 05/08/2017  . MMR 05/30/2013  . Pneumococcal Conjugate-13 12/11/2014  . Pneumococcal-Unspecified 05/30/2013  . Tdap 05/30/2013     Review of Systems  Constitutional: Negative for fatigue.  Eyes: Negative.   Respiratory: Negative.   Cardiovascular: Negative.   Gastrointestinal: Negative.   Endocrine: Negative.  Negative for polydipsia, polyphagia and polyuria.  Genitourinary: Negative.   Musculoskeletal: Positive for arthralgias and myalgias.       Left shoulder pain  Allergic/Immunologic: Negative.   Neurological: Negative.   Hematological: Negative.   Psychiatric/Behavioral: Negative.        Objective:  Physical Exam  Constitutional: He is oriented to person, place, and time.  HENT:  Head: Normocephalic and atraumatic.  Right Ear: External ear normal.  Left Ear: External ear normal.  Nose: Nose normal.  Mouth/Throat: Oropharynx is clear and moist.  Eyes: Pupils are equal, round, and reactive to light.  Neck: Normal range of motion. Neck supple.  Cardiovascular: Normal rate, regular rhythm, normal heart sounds and intact distal pulses.   Pulmonary/Chest: Effort normal.  Abdominal: Soft. Bowel sounds are normal.  Musculoskeletal:       Left shoulder: He exhibits decreased range of motion, pain and  decreased strength (3/5).  Neurological: He is alert and oriented to person, place, and time.  Skin: Skin is warm and dry.  Psychiatric: He has a normal mood and affect. His behavior is normal. Judgment and thought content normal.      BP 124/84 (BP Location: Left Arm, Patient Position: Sitting, Cuff Size: Normal) Comment: manually  Pulse (!) 48   Temp 97.7 F (36.5 C) (Oral)   Resp 16   Ht 6' (1.829 m)   Wt 207 lb (93.9 kg)   SpO2 100%   BMI 28.07 kg/m  Assessment & Plan:  1. Essential hypertension Blood pressure is at goal on current medication regimen No medication changes warranted Continue DASH diet. Reminder to go to the ER if any CP, SOB, nausea, dizziness, severe HA, changes vision/speech, left arm numbness and tingling and jaw pain.   Will review renal functioning as results become available.  - BASIC METABOLIC PANEL WITH GFR - POCT urinalysis dip (device)  2. Chronic left shoulder pain Advised to refrain from taking NSAIDs or other nephrotoxic pain relievers.  Will start a trial of Tylenol #3 every 6 hours for moderate to severe pain Reviewed [place] Substance Reporting system prior to prescribing opiate medications. No inconsistencies noted.   Follow up with orthopedic provider as scheduled for further workup and evaluation  - Acetaminophen-Codeine (TYLENOL/CODEINE #3) 300-30 MG tablet; Take 1 tablet by mouth every 6 (six) hours as needed for pain.  Dispense: 15 tablet; Refill: 0  3. Injury of left shoulder, initial encounter - Acetaminophen-Codeine (TYLENOL/CODEINE #3) 300-30 MG tablet; Take 1 tablet by mouth every 6 (six) hours as needed for pain.  Dispense: 15 tablet; Refill: 0  4. Need for immunization against influenza  - Flu Vaccine QUAD 36+ mos IM  5. Neuropathy Will discontinue Gabapentin due to insurance constraints Will start Lyrica at 75 mg BID. Discussed medication and potential side effects at length. Also, given written information.  - pregabalin  (LYRICA) 75 MG capsule; Take 1 capsule (75 mg total) by mouth 2 (two) times daily.  Dispense: 60 capsule; Refill: 2   RTC: 3 months for chronic conditions   Donia Pounds  MSN, FNP-C [facility] [address] [place], [place] [address] [phone]

## 2017-05-08 NOTE — Patient Instructions (Signed)
Will discontinue Gabapentin due to insurance alternative recommendations.  Will start Lyrica 75 mg twice daily. Refrain from drinking, driving,or operating machinery while taking this medication  Your blood pressure is at goal, will continue medication at current dosage.   Left shoulder pain:  Tylenol # 3 every 6 hours as needed for moderate to severe pain Follow up with orthopedic physician for further evaluation  Pregabalin capsules What is this medicine? PREGABALIN (pre GAB a lin) is used to treat nerve pain from diabetes, shingles, spinal cord injury, and fibromyalgia. It is also used to control seizures in epilepsy. This medicine may be used for other purposes; ask your health care provider or pharmacist if you have questions. COMMON BRAND NAME(S): Lyrica What should I tell my health care provider before I take this medicine? They need to know if you have any of these conditions: -bleeding problems -heart disease, including heart failure -history of alcohol or drug abuse -kidney disease -suicidal thoughts, plans, or attempt; a previous suicide attempt by you or a family member -an unusual or allergic reaction to pregabalin, gabapentin, other medicines, foods, dyes, or preservatives -pregnant or trying to get pregnant or trying to conceive with your partner -breast-feeding How should I use this medicine? Take this medicine by mouth with a glass of water. Follow the directions on the prescription label. You can take this medicine with or without food. Take your doses at regular intervals. Do not take your medicine more often than directed. Do not stop taking except on your doctor's advice. A special MedGuide will be given to you by the pharmacist with each prescription and refill. Be sure to read this information carefully each time. Talk to your pediatrician regarding the use of this medicine in children. Special care may be needed. Overdosage: If you think you have taken too much of  this medicine contact a poison control center or emergency room at once. NOTE: This medicine is only for you. Do not share this medicine with others. What if I miss a dose? If you miss a dose, take it as soon as you can. If it is almost time for your next dose, take only that dose. Do not take double or extra doses. What may interact with this medicine? -alcohol -certain medicines for blood pressure like captopril, enalapril, or lisinopril -certain medicines for diabetes, like pioglitazone or rosiglitazone -certain medicines for anxiety or sleep -narcotic medicines for pain This list may not describe all possible interactions. Give your health care provider a list of all the medicines, herbs, non-prescription drugs, or dietary supplements you use. Also tell them if you smoke, drink alcohol, or use illegal drugs. Some items may interact with your medicine. What should I watch for while using this medicine? Tell your doctor or healthcare professional if your symptoms do not start to get better or if they get worse. Visit your doctor or health care professional for regular checks on your progress. Do not stop taking except on your doctor's advice. You may develop a severe reaction. Your doctor will tell you how much medicine to take. Wear a medical identification bracelet or chain if you are taking this medicine for seizures, and carry a card that describes your disease and details of your medicine and dosage times. You may get drowsy or dizzy. Do not drive, use machinery, or do anything that needs mental alertness until you know how this medicine affects you. Do not stand or sit up quickly, especially if you are an older patient. This reduces  the risk of dizzy or fainting spells. Alcohol may interfere with the effect of this medicine. Avoid alcoholic drinks. If you have a heart condition, like congestive heart failure, and notice that you are retaining water and have swelling in your hands or feet,  contact your health care provider immediately. The use of this medicine may increase the chance of suicidal thoughts or actions. Pay special attention to how you are responding while on this medicine. Any worsening of mood, or thoughts of suicide or dying should be reported to your health care professional right away. This medicine has caused reduced sperm counts in some men. This may interfere with the ability to father a child. You should talk to your doctor or health care professional if you are concerned about your fertility. Women who become pregnant while using this medicine for seizures may enroll in the [place] Pregnancy Registry by calling [phone]. This registry collects information about the safety of antiepileptic drug use during pregnancy. What side effects may I notice from receiving this medicine? Side effects that you should report to your doctor or health care professional as soon as possible: -allergic reactions like skin rash, itching or hives, swelling of the face, lips, or tongue -breathing problems -changes in vision -chest pain -confusion -jerking or unusual movements of any part of your body -loss of memory -muscle pain, tenderness, or weakness -suicidal thoughts or other mood changes -swelling of the ankles, feet, hands -unusual bruising or bleeding Side effects that usually do not require medical attention (report to your doctor or health care professional if they continue or are bothersome): -dizziness -drowsiness -dry mouth -headache -nausea -tremors -trouble sleeping -weight gain This list may not describe all possible side effects. Call your doctor for medical advice about side effects. You may report side effects to FDA at 1-800-FDA-1088. Where should I keep my medicine? Keep out of the reach of children. This medicine can be abused. Keep your medicine in a safe place to protect it from theft. Do not share this medicine with  anyone. Selling or giving away this medicine is dangerous and against the law. This medicine may cause accidental overdose and death if it taken by other adults, children, or pets. Mix any unused medicine with a substance like cat litter or coffee grounds. Then throw the medicine away in a sealed container like a sealed bag or a coffee can with a lid. Do not use the medicine after the expiration date. Store at room temperature between 15 and 30 degrees C (59 and 86 degrees F). NOTE: This sheet is a summary. It may not cover all possible information. If you have questions about this medicine, talk to your doctor, pharmacist, or health care provider.  2018 Elsevier/Gold Standard (2015-08-27 10:26:12)

## 2017-05-10 ENCOUNTER — Telehealth: Payer: Self-pay

## 2017-05-10 NOTE — Telephone Encounter (Signed)
Called, no answer. Left a message for patient to call back. Thanks!  

## 2017-05-10 NOTE — Telephone Encounter (Signed)
-----   Message from Dorena Dew, [place] sent at 05/10/2017  3:03 PM EDT ----- Please inform patient that that kidney functioning has improved slightly since previous visit. Will maintain tight control of blood pressure. Continue to take anti hypertensive medications consistently and check blood pressure regularly.  Thanks   ----- Message ----- From: Cheyenne Adas Lab Results In Sent: 05/08/2017  11:54 PM To: Dorena Dew, FNP

## 2017-05-11 NOTE — Telephone Encounter (Signed)
Called no answer. Left a message for patient to call back. Thanks!  

## 2017-05-11 NOTE — Telephone Encounter (Signed)
Patient returned call, I advised that kidney function has improved slightly and that he should continue to take bp meds consistently and check bp regularly. Patient verbalized understanding. Thanks!

## 2017-06-01 ENCOUNTER — Telehealth: Payer: Self-pay

## 2017-06-01 ENCOUNTER — Other Ambulatory Visit: Payer: Self-pay

## 2017-06-01 NOTE — Telephone Encounter (Signed)
Patient called and wants to know if you can change the Lyrica back to Gabapentin ? He says the price is too high for the Lyrica. Please advise and if so send in to [facility]. Thanks!

## 2017-06-02 ENCOUNTER — Other Ambulatory Visit: Payer: Self-pay | Admitting: Family Medicine

## 2017-06-02 DIAGNOSIS — G629 Polyneuropathy, unspecified: Secondary | ICD-10-CM

## 2017-06-02 MED ORDER — GABAPENTIN 300 MG PO CAPS: 300.0000 mg | ORAL_CAPSULE | Freq: Three times a day (TID) | ORAL | 1 refills | Status: DC

## 2017-06-02 NOTE — Progress Notes (Signed)
Meds ordered this encounter  Medications  . gabapentin (NEURONTIN) 300 MG capsule    Sig: Take 1 capsule (300 mg total) by mouth 3 (three) times daily.    Dispense:  90 capsule    Refill:  [place]  MSN, FNP-C [facility] [address] [place], [place] [address] [phone]

## 2017-06-30 ENCOUNTER — Other Ambulatory Visit: Payer: Self-pay | Admitting: Family Medicine

## 2017-07-04 ENCOUNTER — Other Ambulatory Visit: Payer: Self-pay

## 2017-07-04 MED ORDER — AMLODIPINE BESYLATE 10 MG PO TABS: 10.0000 mg | ORAL_TABLET | Freq: Every morning | ORAL | 1 refills | Status: AC

## 2017-08-07 ENCOUNTER — Telehealth: Payer: Self-pay | Admitting: *Deleted

## 2017-08-07 NOTE — Telephone Encounter (Signed)
   [facility] Pre-operative Risk Assessment    Request for surgical clearance:  1. What type of surgery is being performed? CATARACT EXTRACTION W/INTROCULAR LENS IMPLANTATION OF LEFT EYE FOLLOWED BY THE RIGHT EYE   2. When is this surgery scheduled? 09/21/17   3. Are there any medications that need to be held prior to surgery and how long? NONE    4. Practice name and name of physician performing surgery? [facility]   5. What is your office phone and fax number? PH# [phone], FAX # [phone]   6. Anesthesia type (None, local, MAC, general) ? TOPICAL ANESTHESIA W/IV MEDICATION   Julaine Hua 08/07/2017, 1:30 PM  _________________________________________________________________   (provider comments below)

## 2017-08-07 NOTE — Telephone Encounter (Addendum)
   Primary Cardiologist: Will Meredith Leeds, MD  Chart reviewed as part of pre-operative protocol coverage. As cataract surgery is considered low risk without formal cardiac clearance needed, based on ACC/AHA guidelines, Cory Marks would be at acceptable risk for the planned procedure without further cardiovascular testing.   I will route this recommendation to the requesting party via Epic fax function and remove from pre-op pool.  Please call with questions.  Charlie Pitter, PA-C 08/07/2017, 2:41 PM

## 2017-08-08 HISTORY — PX: OTHER SURGICAL HISTORY: SHX169

## 2017-08-11 ENCOUNTER — Ambulatory Visit: Payer: Medicare PPO | Admitting: Family Medicine

## 2017-08-17 ENCOUNTER — Encounter: Payer: Self-pay | Admitting: Family Medicine

## 2017-08-17 ENCOUNTER — Ambulatory Visit (INDEPENDENT_AMBULATORY_CARE_PROVIDER_SITE_OTHER): Payer: Medicare PPO | Admitting: Family Medicine

## 2017-08-17 ENCOUNTER — Ambulatory Visit (HOSPITAL_COMMUNITY)
Admission: RE | Admit: 2017-08-17 | Discharge: 2017-08-17 | Disposition: A | Discharge status: Home / Self Care | Payer: Medicare PPO | Source: Ambulatory Visit | Attending: Family Medicine | Admitting: Family Medicine

## 2017-08-17 VITALS — BP 116/86 | HR 52 | Temp 98.2°F | Resp 14 | Ht 72.0 in | Wt 215.0 lb

## 2017-08-17 DIAGNOSIS — G629 Polyneuropathy, unspecified: Secondary | ICD-10-CM

## 2017-08-17 DIAGNOSIS — G8929 Other chronic pain: Secondary | ICD-10-CM | POA: Diagnosis present

## 2017-08-17 DIAGNOSIS — M19012 Primary osteoarthritis, left shoulder: Secondary | ICD-10-CM | POA: Insufficient documentation

## 2017-08-17 DIAGNOSIS — M25512 Pain in left shoulder: Secondary | ICD-10-CM | POA: Diagnosis present

## 2017-08-17 DIAGNOSIS — I10 Essential (primary) hypertension: Secondary | ICD-10-CM | POA: Diagnosis not present

## 2017-08-17 DIAGNOSIS — N183 Chronic kidney disease, stage 3 unspecified: Secondary | ICD-10-CM

## 2017-08-17 LAB — POCT URINALYSIS DIP (DEVICE)
BILIRUBIN URINE: NEGATIVE
GLUCOSE, UA: NEGATIVE mg/dL
Hgb urine dipstick: NEGATIVE
KETONES UR: NEGATIVE mg/dL
Leukocytes, UA: NEGATIVE
NITRITE: NEGATIVE
PROTEIN: NEGATIVE mg/dL
Specific Gravity, Urine: 1.025 (ref 1.005–1.030)
Urobilinogen, UA: 0.2 mg/dL (ref 0.0–1.0)
pH: 5.5 (ref 5.0–8.0)

## 2017-08-17 MED ORDER — PREDNISONE 20 MG PO TABS: 20.0000 mg | ORAL_TABLET | Freq: Every day | ORAL | 0 refills | Status: DC

## 2017-08-17 MED ORDER — GABAPENTIN 300 MG PO CAPS: 300.0000 mg | ORAL_CAPSULE | Freq: Three times a day (TID) | ORAL | 5 refills | Status: DC

## 2017-08-17 NOTE — Progress Notes (Signed)
Subjective:    Patient ID: RICHIE BONANNO, male    DOB: 07/02/1961, 57 y.o.   MRN: [id number]  HPI Chantry Headen, a 57 year old male with a history of hypertension, type 2 diabetes, and CHF presents for a follow up of chronic conditions. Patient is also status post left total knee replacement on 01/30/2017. Mr. Muellner has been taking all antihypertensive medications consistently. He does not exercise routinely and has been following a low sodium diet. He does not check blood pressure at home. He denies chest pain, dizziness, headache, syncope, or bilateral lower extremity swelling.   Mr. Kirk also complains of chronic left shoulder pain. Patient says that left shoulder pain has been present over the past several months. The pain is characterizedthrobbing.  The onset of the pain was sudden, related to self injury. Mechanism of injury: gripping, lifting and reaching.  The pain occurs continuously .   Symptoms are aggravated by lifting overhead. Daily activities have been limited due to left shoulder pain. Current pain intensity is 6/10. He has taken OTC Aleve with minimal relief.   Past Medical History:  Diagnosis Date  . Arthritis   . Bell's palsy   . CAD (coronary artery disease)   . Cancer [facility]) 2012   Mantle Cell Cancer- S/P stem cell transplant  . CKD (chronic kidney disease) stage 3, GFR 30-59 ml/min (HCC)   . Diabetes mellitus without complication (HCC)    hx of lost weight due to cancer not on medications since 2014   . Dysrhythmia    atrial fib and atrial flutter  . History of chemotherapy last tx 2014  . History of diabetes mellitus NONE SINCE 2012 CANCER  . History of splenectomy   . Hyperlipemia   . Hypertension   . Neuromuscular disorder ([place])    peripheral neuropathy  . Neuropathy    right leg due to football injury  . Polysubstance abuse ([place])   . Sinus bradycardia    Social History   Socioeconomic History  . Marital status: Married    Spouse name: Not on file  .  Number of children: Not on file  . Years of education: Not on file  . Highest education level: Not on file  Social Needs  . Financial resource strain: Not on file  . Food insecurity - worry: Not on file  . Food insecurity - inability: Not on file  . Transportation needs - medical: Not on file  . Transportation needs - non-medical: Not on file  Occupational History  . Not on file  Tobacco Use  . Smoking status: Never Smoker  . Smokeless tobacco: Never Used  Substance and Sexual Activity  . Alcohol use: Yes    Alcohol/week: 0.6 oz    Types: 1 Cans of beer per week    Comment: occasional   . Drug use: No    Comment: last use few years ago per patient - used marijuana   . Sexual activity: Yes    Partners: Female  Other Topics Concern  . Not on file  Social History Narrative  . Not on file   Allergies  Allergen Reactions  . Oxycodone Itching and Palpitations   Immunization History  Administered Date(s) Administered  . DTaP 05/30/2013  . Influenza,inj,Quad PF,6+ Mos 05/30/2014, 06/15/2015, 03/29/2016, 05/08/2017  . MMR 05/30/2013  . Pneumococcal Conjugate-13 12/11/2014  . Pneumococcal-Unspecified 05/30/2013  . Tdap 05/30/2013     Review of Systems  Constitutional: Negative for fatigue.  Eyes: Negative.  Respiratory: Negative.   Cardiovascular: Negative.   Gastrointestinal: Negative.   Endocrine: Negative.  Negative for polydipsia, polyphagia and polyuria.  Genitourinary: Negative.   Musculoskeletal: Positive for arthralgias and myalgias.       Left shoulder pain  Allergic/Immunologic: Negative.   Neurological: Negative.   Hematological: Negative.   Psychiatric/Behavioral: Negative.        Objective:   Physical Exam  Constitutional: He is oriented to person, place, and time.  HENT:  Head: Normocephalic and atraumatic.  Right Ear: External ear normal.  Left Ear: External ear normal.  Nose: Nose normal.  Mouth/Throat: Oropharynx is clear and moist.  Eyes:  Pupils are equal, round, and reactive to light.  Neck: Normal range of motion. Neck supple.  Cardiovascular: Normal rate, regular rhythm, normal heart sounds and intact distal pulses.  Pulmonary/Chest: Effort normal.  Abdominal: Soft. Bowel sounds are normal.  Musculoskeletal:       Left shoulder: He exhibits decreased range of motion, pain and decreased strength (3/5).  Neurological: He is alert and oriented to person, place, and time.  Skin: Skin is warm and dry.  Psychiatric: He has a normal mood and affect. His behavior is normal. Judgment and thought content normal.      BP 116/86 (BP Location: Right Arm, Patient Position: Sitting, Cuff Size: Large)   Pulse (!) 52   Temp 98.2 F (36.8 C) (Oral)   Resp 14   Ht 6' (1.829 m)   Wt 215 lb (97.5 kg)   SpO2 99%   BMI 29.16 kg/m  Assessment & Plan:  1. Essential hypertension Blood pressure is at goal on current medication regimen No medication changes warranted Continue DASH diet. Reminder to go to the ER if any CP, SOB, nausea, dizziness, severe HA, changes vision/speech, left arm numbness and tingling and jaw pain.   Will review renal functioning as results become available.  - Basic Metabolic Panel - POCT urinalysis dip (device)  2. CKD (chronic kidney disease) stage 3, GFR 30-59 ml/min (HCC) - Basic Metabolic Panel - POCT urinalysis dip (device)  3. Neuropathy - gabapentin (NEURONTIN) 300 MG capsule; Take 1 capsule (300 mg total) by mouth 3 (three) times daily.  Dispense: 270 capsule; Refill: 5  4. Chronic left shoulder pain Will follow up by phone after reviewing left shoulder xray Will start a trial of prednisone 20 mg daily for 5 days.  - DG Shoulder Left; Future - predniSONE (DELTASONE) 20 MG tablet; Take 1 tablet (20 mg total) by mouth daily with breakfast.  Dispense: 5 tablet; Refill: 0   RTC: 3 months for chronic conditions   Donia Pounds  MSN, FNP-C [facility] [address] [place], [place] [address] [phone]

## 2017-08-17 NOTE — Patient Instructions (Signed)
Your blood pressure is at goal on today. No medication changes warranted.  You are complaining of left shoulder pain for the past 3 months. Will start a trial of prednisone 20 mg daily for 5 days. Will also send for a left shoulder xray. I will follow up with you by phone following the xray. If abnormal, will possibly send a referral to orthopedics for further workup and evaluation.

## 2017-08-18 ENCOUNTER — Telehealth: Payer: Self-pay

## 2017-08-18 LAB — BASIC METABOLIC PANEL
BUN/Creatinine Ratio: 14 (ref 9–20)
BUN: 28 mg/dL — ABNORMAL HIGH (ref 6–24)
CHLORIDE: 105 mmol/L (ref 96–106)
CO2: 22 mmol/L (ref 20–29)
Calcium: 9.8 mg/dL (ref 8.7–10.2)
Creatinine, Ser: 1.95 mg/dL — ABNORMAL HIGH (ref 0.76–1.27)
GFR calc Af Amer: 43 mL/min/{1.73_m2} — ABNORMAL LOW (ref >59)
GFR calc non Af Amer: 37 mL/min/{1.73_m2} — ABNORMAL LOW (ref >59)
Glucose: 91 mg/dL (ref 65–99)
Potassium: 3.9 mmol/L (ref 3.5–5.2)
Sodium: 142 mmol/L (ref 134–144)

## 2017-08-18 NOTE — Telephone Encounter (Signed)
-----   Message from Dorena Dew, [place] sent at 08/17/2017  4:52 PM EST ----- Regarding: xray results Please inform patient that left shoulder x-ray showed degenerative changes that are consistent with osteoarthritis.  Recommend Tylenol 500 mg every 6 hours as needed for mild to moderate pain.  Also continue trial of prednisone for 5 days.  If pain worsens, schedule follow-up appointment. Thanks

## 2017-08-18 NOTE — Telephone Encounter (Signed)
Called and spoke with patient, Advised of x-ray showing degenerative changes that are consistent with ostearthritis. Advised that patient can take Tylenol 500 mg every 6 hours as needed and to continue to prednisone for 5 days. Thanks!

## 2017-08-28 ENCOUNTER — Other Ambulatory Visit: Payer: Self-pay | Admitting: Family Medicine

## 2017-08-28 DIAGNOSIS — M25512 Pain in left shoulder: Principal | ICD-10-CM

## 2017-08-28 DIAGNOSIS — G8929 Other chronic pain: Secondary | ICD-10-CM

## 2017-09-21 DIAGNOSIS — H2512 Age-related nuclear cataract, left eye: Secondary | ICD-10-CM | POA: Diagnosis not present

## 2017-09-21 DIAGNOSIS — H59222 Accidental puncture and laceration of left eye and adnexa during other procedure: Secondary | ICD-10-CM | POA: Diagnosis not present

## 2017-09-22 DIAGNOSIS — E1136 Type 2 diabetes mellitus with diabetic cataract: Secondary | ICD-10-CM | POA: Diagnosis not present

## 2017-09-22 DIAGNOSIS — H2702 Aphakia, left eye: Secondary | ICD-10-CM | POA: Diagnosis not present

## 2017-09-22 DIAGNOSIS — N183 Chronic kidney disease, stage 3 (moderate): Secondary | ICD-10-CM | POA: Diagnosis not present

## 2017-09-22 DIAGNOSIS — I499 Cardiac arrhythmia, unspecified: Secondary | ICD-10-CM | POA: Diagnosis not present

## 2017-09-22 DIAGNOSIS — H59022 Cataract (lens) fragments in eye following cataract surgery, left eye: Secondary | ICD-10-CM | POA: Insufficient documentation

## 2017-09-22 DIAGNOSIS — H278 Other specified disorders of lens: Secondary | ICD-10-CM | POA: Diagnosis not present

## 2017-09-22 DIAGNOSIS — H25811 Combined forms of age-related cataract, right eye: Secondary | ICD-10-CM | POA: Diagnosis not present

## 2017-09-22 DIAGNOSIS — I129 Hypertensive chronic kidney disease with stage 1 through stage 4 chronic kidney disease, or unspecified chronic kidney disease: Secondary | ICD-10-CM | POA: Diagnosis not present

## 2017-09-22 DIAGNOSIS — H4052X4 Glaucoma secondary to other eye disorders, left eye, indeterminate stage: Secondary | ICD-10-CM | POA: Diagnosis not present

## 2017-09-22 DIAGNOSIS — E1151 Type 2 diabetes mellitus with diabetic peripheral angiopathy without gangrene: Secondary | ICD-10-CM | POA: Diagnosis not present

## 2017-09-22 DIAGNOSIS — E1122 Type 2 diabetes mellitus with diabetic chronic kidney disease: Secondary | ICD-10-CM | POA: Diagnosis not present

## 2017-09-22 DIAGNOSIS — H4302 Vitreous prolapse, left eye: Secondary | ICD-10-CM | POA: Diagnosis not present

## 2017-09-22 DIAGNOSIS — I2510 Atherosclerotic heart disease of native coronary artery without angina pectoris: Secondary | ICD-10-CM | POA: Diagnosis not present

## 2017-09-22 DIAGNOSIS — H3322 Serous retinal detachment, left eye: Secondary | ICD-10-CM | POA: Diagnosis not present

## 2017-09-22 DIAGNOSIS — H2511 Age-related nuclear cataract, right eye: Secondary | ICD-10-CM | POA: Diagnosis not present

## 2017-09-22 DIAGNOSIS — E1142 Type 2 diabetes mellitus with diabetic polyneuropathy: Secondary | ICD-10-CM | POA: Diagnosis not present

## 2017-10-04 DIAGNOSIS — H278 Other specified disorders of lens: Secondary | ICD-10-CM | POA: Diagnosis not present

## 2017-10-04 DIAGNOSIS — H2702 Aphakia, left eye: Secondary | ICD-10-CM | POA: Diagnosis not present

## 2017-10-04 DIAGNOSIS — H4052X4 Glaucoma secondary to other eye disorders, left eye, indeterminate stage: Secondary | ICD-10-CM | POA: Diagnosis not present

## 2017-10-04 DIAGNOSIS — H33312 Horseshoe tear of retina without detachment, left eye: Secondary | ICD-10-CM | POA: Diagnosis not present

## 2017-10-04 DIAGNOSIS — H2511 Age-related nuclear cataract, right eye: Secondary | ICD-10-CM | POA: Diagnosis not present

## 2017-10-04 DIAGNOSIS — Z48810 Encounter for surgical aftercare following surgery on the sense organs: Secondary | ICD-10-CM | POA: Diagnosis not present

## 2017-10-04 DIAGNOSIS — H40052 Ocular hypertension, left eye: Secondary | ICD-10-CM | POA: Diagnosis not present

## 2017-10-04 DIAGNOSIS — H59022 Cataract (lens) fragments in eye following cataract surgery, left eye: Secondary | ICD-10-CM | POA: Diagnosis not present

## 2017-10-17 DIAGNOSIS — H33002 Unspecified retinal detachment with retinal break, left eye: Secondary | ICD-10-CM | POA: Diagnosis not present

## 2017-10-17 DIAGNOSIS — H59022 Cataract (lens) fragments in eye following cataract surgery, left eye: Secondary | ICD-10-CM | POA: Diagnosis not present

## 2017-10-17 DIAGNOSIS — H40052 Ocular hypertension, left eye: Secondary | ICD-10-CM | POA: Diagnosis not present

## 2017-11-03 DIAGNOSIS — H59022 Cataract (lens) fragments in eye following cataract surgery, left eye: Secondary | ICD-10-CM | POA: Diagnosis not present

## 2017-11-16 ENCOUNTER — Encounter: Payer: Self-pay | Admitting: Family Medicine

## 2017-11-16 ENCOUNTER — Ambulatory Visit (INDEPENDENT_AMBULATORY_CARE_PROVIDER_SITE_OTHER): Payer: Self-pay | Admitting: Family Medicine

## 2017-11-16 VITALS — BP 114/87 | HR 54 | Temp 97.8°F | Resp 16 | Ht 73.0 in | Wt 209.0 lb

## 2017-11-16 DIAGNOSIS — N183 Chronic kidney disease, stage 3 unspecified: Secondary | ICD-10-CM

## 2017-11-16 DIAGNOSIS — S98111A Complete traumatic amputation of right great toe, initial encounter: Secondary | ICD-10-CM

## 2017-11-16 DIAGNOSIS — I10 Essential (primary) hypertension: Secondary | ICD-10-CM

## 2017-11-16 DIAGNOSIS — E1122 Type 2 diabetes mellitus with diabetic chronic kidney disease: Secondary | ICD-10-CM

## 2017-11-16 DIAGNOSIS — R7303 Prediabetes: Secondary | ICD-10-CM | POA: Diagnosis not present

## 2017-11-16 DIAGNOSIS — E785 Hyperlipidemia, unspecified: Secondary | ICD-10-CM

## 2017-11-16 DIAGNOSIS — I129 Hypertensive chronic kidney disease with stage 1 through stage 4 chronic kidney disease, or unspecified chronic kidney disease: Secondary | ICD-10-CM

## 2017-11-16 DIAGNOSIS — IMO0002 Reserved for concepts with insufficient information to code with codable children: Secondary | ICD-10-CM

## 2017-11-16 LAB — POCT GLYCOSYLATED HEMOGLOBIN (HGB A1C): Hemoglobin A1C: 5.8

## 2017-11-16 NOTE — Progress Notes (Signed)
Subjective:    Patient ID: Cory Marks, male    DOB: Sep 29, 1960, 57 y.o.   MRN: [id number]  Indalecio Malmstrom, a 57 year old male with a history of hypertension, controlled type 2 diabetes mellitus, and CHF presents for a follow up of chronic conditions. He says that he is taking all medications consistently. He is s/p total knee replacement and right great toe amputation and has not been able to exercise routinely. He says that diet has been balanced, he mostly prepares meals at [place]. He has not been checking blood pressure at home.   Hypertension  The problem is controlled. Pertinent negatives include no blurred vision, chest pain, headaches, neck pain, orthopnea, palpitations, peripheral edema, PND, shortness of breath or sweats. Risk factors for coronary artery disease include obesity, sedentary lifestyle, dyslipidemia and diabetes mellitus. There are no compliance problems.  Hypertensive end-organ damage includes kidney disease and heart failure. There is no history of sleep apnea or a thyroid problem.  Diabetes  He has type 2 diabetes mellitus. His disease course has been stable. Pertinent negatives for hypoglycemia include no headaches or sweats. Pertinent negatives for diabetes include no blurred vision, no chest pain, no fatigue, no polydipsia, no polyphagia and no polyuria. Risk factors for coronary artery disease include sedentary lifestyle and hypertension. He is following a generally healthy diet. He has not had a previous visit with a dietitian. He participates in exercise daily. An ACE inhibitor/angiotensin II receptor blocker is being taken.    Past Medical History:  Diagnosis Date  . Arthritis   . Bell's palsy   . CAD (coronary artery disease)   . Cancer [facility]) 2012   Mantle Cell Cancer- S/P stem cell transplant  . CKD (chronic kidney disease) stage 3, GFR 30-59 ml/min (HCC)   . Diabetes mellitus without complication (HCC)    hx of lost weight due to cancer not on medications  since 2014   . Dysrhythmia    atrial fib and atrial flutter  . History of chemotherapy last tx 2014  . History of diabetes mellitus NONE SINCE 2012 CANCER  . History of splenectomy   . Hyperlipemia   . Hypertension   . Neuromuscular disorder ([place])    peripheral neuropathy  . Neuropathy    right leg due to football injury  . Polysubstance abuse ([place])   . Sinus bradycardia    Social History   Socioeconomic History  . Marital status: Married    Spouse name: Not on file  . Number of children: Not on file  . Years of education: Not on file  . Highest education level: Not on file  Occupational History  . Not on file  Social Needs  . Financial resource strain: Not on file  . Food insecurity:    Worry: Not on file    Inability: Not on file  . Transportation needs:    Medical: Not on file    Non-medical: Not on file  Tobacco Use  . Smoking status: Never Smoker  . Smokeless tobacco: Never Used  Substance and Sexual Activity  . Alcohol use: Yes    Alcohol/week: 0.6 oz    Types: 1 Cans of beer per week    Comment: occasional   . Drug use: No    Comment: last use few years ago per patient - used marijuana   . Sexual activity: Yes    Partners: Female  Lifestyle  . Physical activity:    Days per week: Not on file  Minutes per session: Not on file  . Stress: Not on file  Relationships  . Social connections:    Talks on phone: Not on file    Gets together: Not on file    Attends religious service: Not on file    Active member of club or organization: Not on file    Attends meetings of clubs or organizations: Not on file    Relationship status: Not on file  . Intimate partner violence:    Fear of current or ex partner: Not on file    Emotionally abused: Not on file    Physically abused: Not on file    Forced sexual activity: Not on file  Other Topics Concern  . Not on file  Social History Narrative  . Not on file   Allergies  Allergen Reactions  . Oxycodone  Itching and Palpitations   Immunization History  Administered Date(s) Administered  . DTaP 05/30/2013  . Influenza,inj,Quad PF,6+ Mos 05/30/2014, 06/15/2015, 03/29/2016, 05/08/2017  . MMR 05/30/2013  . Pneumococcal Conjugate-13 12/11/2014  . Pneumococcal-Unspecified 05/30/2013  . Tdap 05/30/2013     Review of Systems  Constitutional: Negative for fatigue.  Eyes: Negative.  Negative for blurred vision.  Respiratory: Negative.  Negative for shortness of breath.   Cardiovascular: Negative.  Negative for chest pain, palpitations, orthopnea and PND.  Gastrointestinal: Negative.   Endocrine: Negative.  Negative for polydipsia, polyphagia and polyuria.  Genitourinary: Negative.   Musculoskeletal: Positive for arthralgias. Negative for neck pain.  Allergic/Immunologic: Negative.   Neurological: Negative.  Negative for headaches.  Hematological: Negative.   Psychiatric/Behavioral: Negative.        Objective:   Physical Exam  Constitutional: He is oriented to person, place, and time.  HENT:  Head: Normocephalic and atraumatic.  Right Ear: External ear normal.  Left Ear: External ear normal.  Nose: Nose normal.  Mouth/Throat: Oropharynx is clear and moist.  Eyes: Pupils are equal, round, and reactive to light.  Neck: Normal range of motion. Neck supple.  Cardiovascular: Normal rate, regular rhythm, normal heart sounds and intact distal pulses.  Pulmonary/Chest: Effort normal.  Abdominal: Soft. Bowel sounds are normal.  Musculoskeletal:       Right ankle: He exhibits decreased range of motion.  Right great toe amputation  Neurological: He is alert and oriented to person, place, and time.  Skin: Skin is warm and dry.  Psychiatric: He has a normal mood and affect. His behavior is normal. Judgment and thought content normal.      BP 114/87 (BP Location: Left Arm, Patient Position: Sitting, Cuff Size: Normal)   Pulse (!) 54   Temp 97.8 F (36.6 C) (Oral)   Resp 16   Ht 6' 1"   (1.854 m)   Wt 209 lb (94.8 kg)   SpO2 100%   BMI 27.57 kg/m  Assessment & Plan:  1. Essential hypertension Blood pressure is at goal on current medication regimen Continue medication, monitor blood pressure at home. Continue DASH diet. Reminded to go to the ER if any CP, SOB, nausea, dizziness, severe HA, changes vision/speech, left arm numbness and tingling and jaw pain.  - Urinalysis - Basic Metabolic Panel  2. Type 2 DM with CKD stage 3 and hypertension (HCC) Hemoglobin a1C is 5.8, which is at goal.  Will review renal function as results become available - Basic Metabolic Panel - Hemoglobin A1c - HgB A1c  3. Hyperlipidemia, unspecified hyperlipidemia type The 10-year ASCVD risk score Mikey Bussing DC Jr., et al., 2013) is: 15.9%  Values used to calculate the score:     Age: 57 years     Sex: Male     Is Non-Hispanic African American: Yes     Diabetic: Yes     Tobacco smoker: No     Systolic Blood Pressure: 037 mmHg     Is BP treated: Yes     HDL Cholesterol: 64 mg/dL     Total Cholesterol: 178 mg/dL  - Lipid Panel; Future  4. Great toe amputation status, right [facility]) Mr. Perales primarily wears custom fit orthopedic shoes due to right great toe amputation. He is requesting order for shoes.    RTC: 3 months for chronic conditions  Donia Pounds  MSN, FNP-C Patient [facility] [address] [place], [place] [address] [phone]

## 2017-11-16 NOTE — Patient Instructions (Signed)
Your blood pressure is at goal, no medication changes warranted on today.  We have discussed target BP range and blood pressure goal. I have advised patient to check BP regularly and to call us back or report to clinic if the numbers are consistently higher than 140/90. We discussed the importance of compliance with medical therapy and DASH diet recommended, consequences of uncontrolled hypertension discussed.  - continue current BP medications

## 2017-11-17 LAB — BASIC METABOLIC PANEL
BUN / CREAT RATIO: 19 (ref 9–20)
BUN: 37 mg/dL — ABNORMAL HIGH (ref 6–24)
CHLORIDE: 106 mmol/L (ref 96–106)
CO2: 22 mmol/L (ref 20–29)
Calcium: 9.4 mg/dL (ref 8.7–10.2)
Creatinine, Ser: 1.96 mg/dL — ABNORMAL HIGH (ref 0.76–1.27)
GFR calc Af Amer: 43 mL/min/{1.73_m2} — ABNORMAL LOW (ref >59)
GFR calc non Af Amer: 37 mL/min/{1.73_m2} — ABNORMAL LOW (ref >59)
Glucose: 93 mg/dL (ref 65–99)
Potassium: 4.5 mmol/L (ref 3.5–5.2)
SODIUM: 142 mmol/L (ref 134–144)

## 2017-11-17 LAB — URINALYSIS
Bilirubin, UA: NEGATIVE
GLUCOSE, UA: NEGATIVE
Ketones, UA: NEGATIVE
Leukocytes, UA: NEGATIVE
Nitrite, UA: NEGATIVE
PROTEIN UA: NEGATIVE
RBC, UA: NEGATIVE
Specific Gravity, UA: 1.019 (ref 1.005–1.030)
Urobilinogen, Ur: 0.2 mg/dL (ref 0.2–1.0)
pH, UA: 5.5 (ref 5.0–7.5)

## 2017-11-18 ENCOUNTER — Other Ambulatory Visit (INDEPENDENT_AMBULATORY_CARE_PROVIDER_SITE_OTHER): Payer: Self-pay | Admitting: Family Medicine

## 2017-11-18 DIAGNOSIS — IMO0002 Reserved for concepts with insufficient information to code with codable children: Secondary | ICD-10-CM

## 2017-11-18 DIAGNOSIS — S98111A Complete traumatic amputation of right great toe, initial encounter: Secondary | ICD-10-CM

## 2017-11-18 NOTE — Progress Notes (Signed)
Orders Placed This Encounter  Procedures  . [facility] [place]  MSN, FNP-C [facility] [address] [place], [place] [address] [phone]

## 2017-11-21 ENCOUNTER — Telehealth: Payer: Self-pay

## 2017-11-21 NOTE — Telephone Encounter (Signed)
Called and spoke with patient, advised that labs are consistent with baseline and creatinine level remains elevated. Advised that we will repeat at follow up.   Advised that orthopedic shoe form is available for pick up. Patient verbalized understanding. Thanks!

## 2017-11-21 NOTE — Telephone Encounter (Signed)
-----   Message from Dorena Dew, [place] sent at 11/18/2017  7:14 AM EDT ----- Regarding: lab results Please inform patient that all labs are consistent with baseline. Creatinine level remains elevated, which is indicative of stage 3 chronic kidney disease (has remained stable). Will repeat at follow up appointment.   Also, hard copy of order for orthopedic shoes is available for pick up. He can take it to any medical supply that offers fitting for orthotics. The medical supply will more than likely have additional forms for medicare that will need to be completed and signed. The patient can have them fax forms.   Donia Pounds  MSN, FNP-C Patient [facility] [address] [place], [place] [address] [phone]

## 2017-11-28 DIAGNOSIS — G629 Polyneuropathy, unspecified: Secondary | ICD-10-CM | POA: Diagnosis not present

## 2017-11-28 DIAGNOSIS — I10 Essential (primary) hypertension: Secondary | ICD-10-CM | POA: Diagnosis not present

## 2017-11-28 DIAGNOSIS — M1990 Unspecified osteoarthritis, unspecified site: Secondary | ICD-10-CM | POA: Diagnosis not present

## 2017-11-28 DIAGNOSIS — G510 Bell's palsy: Secondary | ICD-10-CM | POA: Diagnosis not present

## 2017-11-28 DIAGNOSIS — E663 Overweight: Secondary | ICD-10-CM | POA: Diagnosis not present

## 2017-11-28 DIAGNOSIS — M545 Low back pain: Secondary | ICD-10-CM | POA: Diagnosis not present

## 2017-11-28 DIAGNOSIS — E785 Hyperlipidemia, unspecified: Secondary | ICD-10-CM | POA: Diagnosis not present

## 2017-11-28 DIAGNOSIS — Z6829 Body mass index (BMI) 29.0-29.9, adult: Secondary | ICD-10-CM | POA: Diagnosis not present

## 2018-01-01 DIAGNOSIS — H2702 Aphakia, left eye: Secondary | ICD-10-CM | POA: Insufficient documentation

## 2018-01-01 DIAGNOSIS — H40052 Ocular hypertension, left eye: Secondary | ICD-10-CM | POA: Insufficient documentation

## 2018-01-01 DIAGNOSIS — H2511 Age-related nuclear cataract, right eye: Secondary | ICD-10-CM | POA: Insufficient documentation

## 2018-01-02 DIAGNOSIS — H2511 Age-related nuclear cataract, right eye: Secondary | ICD-10-CM | POA: Diagnosis not present

## 2018-01-02 DIAGNOSIS — H40052 Ocular hypertension, left eye: Secondary | ICD-10-CM | POA: Diagnosis not present

## 2018-01-02 DIAGNOSIS — H59022 Cataract (lens) fragments in eye following cataract surgery, left eye: Secondary | ICD-10-CM | POA: Diagnosis not present

## 2018-01-02 DIAGNOSIS — H2702 Aphakia, left eye: Secondary | ICD-10-CM | POA: Diagnosis not present

## 2018-01-24 DIAGNOSIS — H2702 Aphakia, left eye: Secondary | ICD-10-CM | POA: Diagnosis not present

## 2018-01-24 DIAGNOSIS — E119 Type 2 diabetes mellitus without complications: Secondary | ICD-10-CM | POA: Diagnosis not present

## 2018-01-24 DIAGNOSIS — H4302 Vitreous prolapse, left eye: Secondary | ICD-10-CM | POA: Diagnosis not present

## 2018-01-25 DIAGNOSIS — H2702 Aphakia, left eye: Secondary | ICD-10-CM | POA: Diagnosis not present

## 2018-01-25 DIAGNOSIS — Z79899 Other long term (current) drug therapy: Secondary | ICD-10-CM | POA: Diagnosis not present

## 2018-01-25 DIAGNOSIS — Z48810 Encounter for surgical aftercare following surgery on the sense organs: Secondary | ICD-10-CM | POA: Diagnosis not present

## 2018-02-15 ENCOUNTER — Ambulatory Visit (INDEPENDENT_AMBULATORY_CARE_PROVIDER_SITE_OTHER): Payer: Medicare HMO | Admitting: Family Medicine

## 2018-02-15 ENCOUNTER — Encounter: Payer: Self-pay | Admitting: Family Medicine

## 2018-02-15 VITALS — BP 131/88 | HR 50 | Temp 98.9°F | Resp 16 | Ht 73.0 in | Wt 211.0 lb

## 2018-02-15 DIAGNOSIS — N183 Chronic kidney disease, stage 3 unspecified: Secondary | ICD-10-CM

## 2018-02-15 DIAGNOSIS — I129 Hypertensive chronic kidney disease with stage 1 through stage 4 chronic kidney disease, or unspecified chronic kidney disease: Secondary | ICD-10-CM

## 2018-02-15 DIAGNOSIS — I5022 Chronic systolic (congestive) heart failure: Secondary | ICD-10-CM

## 2018-02-15 DIAGNOSIS — I10 Essential (primary) hypertension: Secondary | ICD-10-CM | POA: Diagnosis not present

## 2018-02-15 DIAGNOSIS — E785 Hyperlipidemia, unspecified: Secondary | ICD-10-CM

## 2018-02-15 DIAGNOSIS — R7303 Prediabetes: Secondary | ICD-10-CM | POA: Diagnosis not present

## 2018-02-15 DIAGNOSIS — Z89421 Acquired absence of other right toe(s): Secondary | ICD-10-CM

## 2018-02-15 LAB — POCT URINALYSIS DIPSTICK
Bilirubin, UA: NEGATIVE
Blood, UA: NEGATIVE
Glucose, UA: NEGATIVE
Ketones, UA: NEGATIVE
Leukocytes, UA: NEGATIVE
Nitrite, UA: NEGATIVE
Protein, UA: NEGATIVE
Spec Grav, UA: 1.025 (ref 1.010–1.025)
Urobilinogen, UA: 0.2 E.U./dL
pH, UA: 5.5 (ref 5.0–8.0)

## 2018-02-15 NOTE — Progress Notes (Signed)
Subjective:    Patient here for follow-up of elevated blood pressure.  He is not exercising and is not adherent to a low-salt diet.  Blood pressure is well controlled at home. Cardiac symptoms: lower extremity edema and neuropathy. Patient denies: chest pain, dyspnea, fatigue, palpitations and syncope. Cardiovascular risk factors: advanced age (older than 31 for men, 69 for women), dyslipidemia, male gender, obesity (BMI >= 30 kg/m2) and sedentary lifestyle. Use of agents associated with hypertension: none. History of target organ damage: chronic kidney disease. Patient with hx of mantle cell cancer and has not had issues related to diabetes since 2014. Patient states that he is having pain and neuropathy in the right foot. S/p great toe amputation and injury to the 5th digit of the same foot since playing high school football. Would like a referral to podiatry for further evaluation.  The following portions of the patient's history were reviewed and updated as appropriate: allergies, current medications, past family history, past medical history, past social history, past surgical history and problem list.  Review of Systems Pertinent items noted in HPI and remainder of comprehensive ROS otherwise negative.     Objective:    BP 131/88 (BP Location: Left Arm, Patient Position: Sitting, Cuff Size: Large)   Pulse (!) 50   Temp 98.9 F (37.2 C) (Oral)   Resp 16   Ht 6\' 1"  (1.854 m)   Wt 211 lb (95.7 kg)   SpO2 98%   BMI 27.84 kg/m   General Appearance:    Alert, cooperative, no distress, appears stated age  Head:    Normocephalic, without obvious abnormality, atraumatic           Throat:   Lips, mucosa, and tongue normal; teeth and gums normal  Neck:   Supple, symmetrical, trachea midline, no adenopathy;       thyroid:  No enlargement/tenderness/nodules; no carotid   bruit or JVD  Back:     Symmetric, no curvature, ROM normal, no CVA tenderness  Lungs:     Clear to auscultation  bilaterally, respirations unlabored  Chest wall:    No tenderness or deformity  Heart:    Bradycardic rate and rhythm, S1 and S2 normal, no murmur, rub   or gallop  Abdomen:     Soft, non-tender, bowel sounds active all four quadrants,    no masses, no organomegaly        Extremities:   Extremities normal, atraumatic, no cyanosis or edema  Pulses:   2+ and symmetric all extremities  Skin:   Skin color, texture, turgor normal, no rashes or lesions  Lymph nodes:   Cervical, supraclavicular, and axillary nodes normal  Neurologic:   CNII-XII intact. Normal strength, sensation and reflexes      throughout      Assessment:    Hypertension, normal blood pressure. Evidence of target organ damage: chronic kidney disease.    Plan:  1. Essential hypertension Continue with current medications.  - Urinalysis Dipstick - CBC with Differential - Lipid Panel - Comprehensive metabolic panel - Urinalysis Dipstick  2. CKD (chronic kidney disease) stage 3, GFR 30-59 ml/min (HCC) Continue with current medications.  - CBC with Differential - Lipid Panel - Comprehensive metabolic panel - Urinalysis Dipstick  3. Hyperlipidemia, unspecified hyperlipidemia type Continue with current medications.  - CBC with Differential - Lipid Panel - Comprehensive metabolic panel - Urinalysis Dipstick  4. Prediabetes Continue with current medications.  - CBC with Differential - Lipid Panel - Comprehensive metabolic panel - Urinalysis  Dipstick  5. Chronic systolic congestive heart failure (HCC) Continue with current medications.  - CBC with Differential - Lipid Panel - Comprehensive metabolic panel - Urinalysis Dipstick  6. Hypertensive renal disease Continue with current medications.   7. Dyslipidemia Continue with current medications.   8. Hx of amputation of lesser toe, right ([place]) Continue with current medications.  - Ambulatory referral to Podiatry

## 2018-02-15 NOTE — Patient Instructions (Signed)
Hypertension Hypertension, commonly called high blood pressure, is when the force of blood pumping through the arteries is too strong. The arteries are the blood vessels that carry blood from the heart throughout the body. Hypertension forces the heart to work harder to pump blood and may cause arteries to become narrow or stiff. Having untreated or uncontrolled hypertension can cause heart attacks, strokes, kidney disease, and other problems. A blood pressure reading consists of a higher number over a lower number. Ideally, your blood pressure should be below 120/80. The first ("top") number is called the systolic pressure. It is a measure of the pressure in your arteries as your heart beats. The second ("bottom") number is called the diastolic pressure. It is a measure of the pressure in your arteries as the heart relaxes. What are the causes? The cause of this condition is not known. What increases the risk? Some risk factors for high blood pressure are under your control. Others are not. Factors you can change  Smoking.  Having type 2 diabetes mellitus, high cholesterol, or both.  Not getting enough exercise or physical activity.  Being overweight.  Having too much fat, sugar, calories, or salt (sodium) in your diet.  Drinking too much alcohol. Factors that are difficult or impossible to change  Having chronic kidney disease.  Having a family history of high blood pressure.  Age. Risk increases with age.  Race. You may be at higher risk if you are African-American.  Gender. Men are at higher risk than women before age 45. After age 65, women are at higher risk than men.  Having obstructive sleep apnea.  Stress. What are the signs or symptoms? Extremely high blood pressure (hypertensive crisis) may cause:  Headache.  Anxiety.  Shortness of breath.  Nosebleed.  Nausea and vomiting.  Severe chest pain.  Jerky movements you cannot control (seizures).  How is this  diagnosed? This condition is diagnosed by measuring your blood pressure while you are seated, with your arm resting on a surface. The cuff of the blood pressure monitor will be placed directly against the skin of your upper arm at the level of your heart. It should be measured at least twice using the same arm. Certain conditions can cause a difference in blood pressure between your right and left arms. Certain factors can cause blood pressure readings to be lower or higher than normal (elevated) for a short period of time:  When your blood pressure is higher when you are in a health care provider's office than when you are at home, this is called white coat hypertension. Most people with this condition do not need medicines.  When your blood pressure is higher at home than when you are in a health care provider's office, this is called masked hypertension. Most people with this condition may need medicines to control blood pressure.  If you have a high blood pressure reading during one visit or you have normal blood pressure with other risk factors:  You may be asked to return on a different day to have your blood pressure checked again.  You may be asked to monitor your blood pressure at home for 1 week or longer.  If you are diagnosed with hypertension, you may have other blood or imaging tests to help your health care provider understand your overall risk for other conditions. How is this treated? This condition is treated by making healthy lifestyle changes, such as eating healthy foods, exercising more, and reducing your alcohol intake. Your   Your health care provider may prescribe medicine if lifestyle changes are not enough to get your blood pressure under control, and if:  Your systolic blood pressure is above 130.  Your diastolic blood pressure is above 80.  Your personal target blood pressure may vary depending on your medical conditions, your age, and other factors. Follow these  instructions at home: Eating and drinking  Eat a diet that is high in fiber and potassium, and low in sodium, added sugar, and fat. An example eating plan is called the DASH (Dietary Approaches to Stop Hypertension) diet. To eat this way: ? Eat plenty of fresh fruits and vegetables. Try to fill half of your plate at each meal with fruits and vegetables. ? Eat whole grains, such as whole wheat pasta, brown rice, or whole grain bread. Fill about one quarter of your plate with whole grains. ? Eat or drink low-fat dairy products, such as skim milk or low-fat yogurt. ? Avoid fatty cuts of meat, processed or cured meats, and poultry with skin. Fill about one quarter of your plate with lean proteins, such as fish, chicken without skin, beans, eggs, and tofu. ? Avoid premade and processed foods. These tend to be higher in sodium, added sugar, and fat.  Reduce your daily sodium intake. Most people with hypertension should eat less than 1,500 mg of sodium a day.  Limit alcohol intake to no more than 1 drink a day for nonpregnant women and 2 drinks a day for men. One drink equals 12 oz of beer, 5 oz of wine, or 1 oz of hard liquor. Lifestyle  Work with your health care provider to maintain a healthy body weight or to lose weight. Ask what an ideal weight is for you.  Get at least 30 minutes of exercise that causes your heart to beat faster (aerobic exercise) most days of the week. Activities may include walking, swimming, or biking.  Include exercise to strengthen your muscles (resistance exercise), such as pilates or lifting weights, as part of your weekly exercise routine. Try to do these types of exercises for 30 minutes at least 3 days a week.  Do not use any products that contain nicotine or tobacco, such as cigarettes and e-cigarettes. If you need help quitting, ask your health care provider.  Monitor your blood pressure at home as told by your health care provider.  Keep all follow-up visits as  told by your health care provider. This is important. Medicines  Take over-the-counter and prescription medicines only as told by your health care provider. Follow directions carefully. Blood pressure medicines must be taken as prescribed.  Do not skip doses of blood pressure medicine. Doing this puts you at risk for problems and can make the medicine less effective.  Ask your health care provider about side effects or reactions to medicines that you should watch for. Contact a health care provider if:  You think you are having a reaction to a medicine you are taking.  You have headaches that keep coming back (recurring).  You feel dizzy.  You have swelling in your ankles.  You have trouble with your vision. Get help right away if:  You develop a severe headache or confusion.  You have unusual weakness or numbness.  You feel faint.  You have severe pain in your chest or abdomen.  You vomit repeatedly.  You have trouble breathing. Summary  Hypertension is when the force of blood pumping through your arteries is too strong. If this condition is  controlled, it may put you at risk for serious complications.  Your personal target blood pressure may vary depending on your medical conditions, your age, and other factors. For most people, a normal blood pressure is less than 120/80.  Hypertension is treated with lifestyle changes, medicines, or a combination of both. Lifestyle changes include weight loss, eating a healthy, low-sodium diet, exercising more, and limiting alcohol. This information is not intended to replace advice given to you by your health care provider. Make sure you discuss any questions you have with your health care provider. Document Released: 07/25/2005 Document Revised: 06/22/2016 Document Reviewed: 06/22/2016 Elsevier Interactive Patient Education  2018 [employer] DASH Eating Plan DASH stands for "Dietary Approaches to Stop Hypertension." The DASH  eating plan is a healthy eating plan that has been shown to reduce high blood pressure (hypertension). It may also reduce your risk for type 2 diabetes, heart disease, and stroke. The DASH eating plan may also help with weight loss. What are tips for following this plan? General guidelines  Avoid eating more than 2,300 mg (milligrams) of salt (sodium) a day. If you have hypertension, you may need to reduce your sodium intake to 1,500 mg a day.  Limit alcohol intake to no more than 1 drink a day for nonpregnant women and 2 drinks a day for men. One drink equals 12 oz of beer, 5 oz of wine, or 1 oz of hard liquor.  Work with your health care provider to maintain a healthy body weight or to lose weight. Ask what an ideal weight is for you.  Get at least 30 minutes of exercise that causes your heart to beat faster (aerobic exercise) most days of the week. Activities may include walking, swimming, or biking.  Work with your health care provider or diet and nutrition specialist (dietitian) to adjust your eating plan to your individual calorie needs. Reading food labels  Check food labels for the amount of sodium per serving. Choose foods with less than 5 percent of the Daily Value of sodium. Generally, foods with less than 300 mg of sodium per serving fit into this eating plan.  To find whole grains, look for the word "whole" as the first word in the ingredient list. Shopping  Buy products labeled as "low-sodium" or "no salt added."  Buy fresh foods. Avoid canned foods and premade or frozen meals. Cooking  Avoid adding salt when cooking. Use salt-free seasonings or herbs instead of table salt or sea salt. Check with your health care provider or pharmacist before using salt substitutes.  Do not fry foods. Cook foods using healthy methods such as baking, boiling, grilling, and broiling instead.  Cook with heart-healthy oils, such as olive, canola, soybean, or sunflower oil. Meal  planning   Eat a balanced diet that includes: ? 5 or more servings of fruits and vegetables each day. At each meal, try to fill half of your plate with fruits and vegetables. ? Up to 6-8 servings of whole grains each day. ? Less than 6 oz of lean meat, poultry, or fish each day. A 3-oz serving of meat is about the same size as a deck of cards. One egg equals 1 oz. ? 2 servings of low-fat dairy each day. ? A serving of nuts, seeds, or beans 5 times each week. ? Heart-healthy fats. Healthy fats called Omega-3 fatty acids are found in foods such as flaxseeds and coldwater fish, like sardines, salmon, and mackerel.  Limit how much you eat of the   following: ? Canned or prepackaged foods. ? Food that is high in trans fat, such as fried foods. ? Food that is high in saturated fat, such as fatty meat. ? Sweets, desserts, sugary drinks, and other foods with added sugar. ? Full-fat dairy products.  Do not salt foods before eating.  Try to eat at least 2 vegetarian meals each week.  Eat more home-cooked food and less restaurant, buffet, and fast food.  When eating at a restaurant, ask that your food be prepared with less salt or no salt, if possible. What foods are recommended? The items listed may not be a complete list. Talk with your dietitian about what dietary choices are best for you. Grains Whole-grain or whole-wheat bread. Whole-grain or whole-wheat pasta. Brown rice. Oatmeal. Quinoa. Bulgur. Whole-grain and low-sodium cereals. Pita bread. Low-fat, low-sodium crackers. Whole-wheat flour tortillas. Vegetables Fresh or frozen vegetables (raw, steamed, roasted, or grilled). Low-sodium or reduced-sodium tomato and vegetable juice. Low-sodium or reduced-sodium tomato sauce and tomato paste. Low-sodium or reduced-sodium canned vegetables. Fruits All fresh, dried, or frozen fruit. Canned fruit in natural juice (without added sugar). Meat and other protein foods Skinless chicken or turkey.  Ground chicken or turkey. Pork with fat trimmed off. Fish and seafood. Egg whites. Dried beans, peas, or lentils. Unsalted nuts, nut butters, and seeds. Unsalted canned beans. Lean cuts of beef with fat trimmed off. Low-sodium, lean deli meat. Dairy Low-fat (1%) or fat-free (skim) milk. Fat-free, low-fat, or reduced-fat cheeses. Nonfat, low-sodium ricotta or cottage cheese. Low-fat or nonfat yogurt. Low-fat, low-sodium cheese. Fats and oils Soft margarine without trans fats. Vegetable oil. Low-fat, reduced-fat, or light mayonnaise and salad dressings (reduced-sodium). Canola, safflower, olive, soybean, and sunflower oils. Avocado. Seasoning and other foods Herbs. Spices. Seasoning mixes without salt. Unsalted popcorn and pretzels. Fat-free sweets. What foods are not recommended? The items listed may not be a complete list. Talk with your dietitian about what dietary choices are best for you. Grains Baked goods made with fat, such as croissants, muffins, or some breads. Dry pasta or rice meal packs. Vegetables Creamed or fried vegetables. Vegetables in a cheese sauce. Regular canned vegetables (not low-sodium or reduced-sodium). Regular canned tomato sauce and paste (not low-sodium or reduced-sodium). Regular tomato and vegetable juice (not low-sodium or reduced-sodium). Pickles. Olives. Fruits Canned fruit in a light or heavy syrup. Fried fruit. Fruit in cream or butter sauce. Meat and other protein foods Fatty cuts of meat. Ribs. Fried meat. Bacon. Sausage. Bologna and other processed lunch meats. Salami. Fatback. Hotdogs. Bratwurst. Salted nuts and seeds. Canned beans with added salt. Canned or smoked fish. Whole eggs or egg yolks. Chicken or turkey with skin. Dairy Whole or 2% milk, cream, and half-and-half. Whole or full-fat cream cheese. Whole-fat or sweetened yogurt. Full-fat cheese. Nondairy creamers. Whipped toppings. Processed cheese and cheese spreads. Fats and oils Butter. Stick  margarine. Lard. Shortening. Ghee. Bacon fat. Tropical oils, such as coconut, palm kernel, or palm oil. Seasoning and other foods Salted popcorn and pretzels. Onion salt, garlic salt, seasoned salt, table salt, and sea salt. Worcestershire sauce. Tartar sauce. Barbecue sauce. Teriyaki sauce. Soy sauce, including reduced-sodium. Steak sauce. Canned and packaged gravies. Fish sauce. Oyster sauce. Cocktail sauce. Horseradish that you find on the shelf. Ketchup. Mustard. Meat flavorings and tenderizers. Bouillon cubes. Hot sauce and Tabasco sauce. Premade or packaged marinades. Premade or packaged taco seasonings. Relishes. Regular salad dressings. Where to find more information:  National Heart, Lung, and Blood Institute: www.nhlbi.nih.gov  American Heart Association: www.heart.org   Summary  The DASH eating plan is a healthy eating plan that has been shown to reduce high blood pressure (hypertension). It may also reduce your risk for type 2 diabetes, heart disease, and stroke.  With the DASH eating plan, you should limit salt (sodium) intake to 2,300 mg a day. If you have hypertension, you may need to reduce your sodium intake to 1,500 mg a day.  When on the DASH eating plan, aim to eat more fresh fruits and vegetables, whole grains, lean proteins, low-fat dairy, and heart-healthy fats.  Work with your health care provider or diet and nutrition specialist (dietitian) to adjust your eating plan to your individual calorie needs. This information is not intended to replace advice given to you by your health care provider. Make sure you discuss any questions you have with your health care provider. Document Released: 07/14/2011 Document Revised: 07/18/2016 Document Reviewed: 07/18/2016 Elsevier Interactive Patient Education  2018 Elsevier Inc.  

## 2018-02-16 LAB — COMPREHENSIVE METABOLIC PANEL
ALT: 14 IU/L (ref 0–44)
AST: 11 IU/L (ref 0–40)
Albumin/Globulin Ratio: 2.9 — ABNORMAL HIGH (ref 1.2–2.2)
Albumin: 4.1 g/dL (ref 3.5–5.5)
Alkaline Phosphatase: 47 IU/L (ref 39–117)
BUN/Creatinine Ratio: 14 (ref 9–20)
BUN: 28 mg/dL — ABNORMAL HIGH (ref 6–24)
Bilirubin Total: 0.4 mg/dL (ref 0.0–1.2)
CO2: 22 mmol/L (ref 20–29)
Calcium: 9.6 mg/dL (ref 8.7–10.2)
Chloride: 104 mmol/L (ref 96–106)
Creatinine, Ser: 1.98 mg/dL — ABNORMAL HIGH (ref 0.76–1.27)
GFR calc Af Amer: 42 mL/min/{1.73_m2} — ABNORMAL LOW (ref >59)
GFR calc non Af Amer: 36 mL/min/{1.73_m2} — ABNORMAL LOW (ref >59)
Globulin, Total: 1.4 g/dL — ABNORMAL LOW (ref 1.5–4.5)
Glucose: 83 mg/dL (ref 65–99)
Potassium: 4.5 mmol/L (ref 3.5–5.2)
Sodium: 143 mmol/L (ref 134–144)
Total Protein: 5.5 g/dL — ABNORMAL LOW (ref 6.0–8.5)

## 2018-02-16 LAB — LIPID PANEL
Chol/HDL Ratio: 2.7 ratio (ref 0.0–5.0)
Cholesterol, Total: 184 mg/dL (ref 100–199)
HDL: 67 mg/dL
LDL Calculated: 72 mg/dL (ref 0–99)
Triglycerides: 224 mg/dL — ABNORMAL HIGH (ref 0–149)
VLDL Cholesterol Cal: 45 mg/dL — ABNORMAL HIGH (ref 5–40)

## 2018-02-16 LAB — CBC WITH DIFFERENTIAL/PLATELET
Basophils Absolute: 0 10*3/uL (ref 0.0–0.2)
Basos: 1 %
EOS (ABSOLUTE): 0.3 10*3/uL (ref 0.0–0.4)
Eos: 5 %
Hematocrit: 44.5 % (ref 37.5–51.0)
Hemoglobin: 14.6 g/dL (ref 13.0–17.7)
Immature Grans (Abs): 0 10*3/uL (ref 0.0–0.1)
Immature Granulocytes: 0 %
Lymphocytes Absolute: 2.4 10*3/uL (ref 0.7–3.1)
Lymphs: 43 %
MCH: 33.2 pg — ABNORMAL HIGH (ref 26.6–33.0)
MCHC: 32.8 g/dL (ref 31.5–35.7)
MCV: 101 fL — ABNORMAL HIGH (ref 79–97)
Monocytes Absolute: 0.4 10*3/uL (ref 0.1–0.9)
Monocytes: 7 %
Neutrophils Absolute: 2.4 10*3/uL (ref 1.4–7.0)
Neutrophils: 44 %
Platelets: 163 10*3/uL (ref 150–450)
RBC: 4.4 x10E6/uL (ref 4.14–5.80)
RDW: 14.4 % (ref 12.3–15.4)
WBC: 5.5 10*3/uL (ref 3.4–10.8)

## 2018-02-20 ENCOUNTER — Telehealth: Payer: Self-pay

## 2018-02-20 NOTE — Telephone Encounter (Signed)
-----   Message from Lanae Boast, [place] sent at 02/19/2018  5:20 PM EDT ----- You labs were stable except your triglycerides are elevated.Your LDL (bad cholesterol) decreased.  I encourage you to walk/exercise 30 minutes per day 5 days a week. Eat a low carb diet with lean meats, vegetables and fruits such as strawberries, blueberries, raspberries. Limit fried and fatty foods.  Drink water instead of soda. Please remember your follow up appointment.

## 2018-02-20 NOTE — Telephone Encounter (Signed)
Called, no answer. Left a message for patient to call back. Thanks!  

## 2018-02-21 NOTE — Telephone Encounter (Signed)
Called, no answer. Left a message for patient to call back. Thanks!  

## 2018-02-22 NOTE — Telephone Encounter (Signed)
I have been unable to contact patient by phone, will mail letter. Thanks!~

## 2018-03-13 ENCOUNTER — Ambulatory Visit (INDEPENDENT_AMBULATORY_CARE_PROVIDER_SITE_OTHER): Payer: Medicare HMO

## 2018-03-13 ENCOUNTER — Ambulatory Visit: Payer: Medicare HMO | Admitting: Podiatry

## 2018-03-13 ENCOUNTER — Encounter: Payer: Self-pay | Admitting: Podiatry

## 2018-03-13 DIAGNOSIS — M2041 Other hammer toe(s) (acquired), right foot: Secondary | ICD-10-CM

## 2018-03-13 DIAGNOSIS — E1149 Type 2 diabetes mellitus with other diabetic neurological complication: Secondary | ICD-10-CM | POA: Diagnosis not present

## 2018-03-13 DIAGNOSIS — M2042 Other hammer toe(s) (acquired), left foot: Secondary | ICD-10-CM

## 2018-03-13 DIAGNOSIS — Z89429 Acquired absence of other toe(s), unspecified side: Secondary | ICD-10-CM | POA: Diagnosis not present

## 2018-03-13 NOTE — Patient Instructions (Signed)

## 2018-03-13 NOTE — Progress Notes (Signed)
Subjective:    Patient ID: Cory Marks, male    DOB: 11/23/1960, 57 y.o.   MRN: [id number]  HPI 57 year old male presents the office today for concerns of some pain to the right foot mostly on the right fifth toe.  He states that he has had his right first and second toes amputated and is now since fifth toe has been controlled and he gets pain to the fifth toe with pressure at times this is been a chronic issue.  Denies any recent injury or trauma to his foot.  He also states he has scab or callus on the left big toe and he did try to take this off himself.  Denies any open sores or any drainage or swelling.   Review of Systems  All other systems reviewed and are negative.  Past Medical History:  Diagnosis Date  . Arthritis   . Bell's palsy   . CAD (coronary artery disease)   . Cancer [facility]) 2012   Mantle Cell Cancer- S/P stem cell transplant  . CKD (chronic kidney disease) stage 3, GFR 30-59 ml/min (HCC)   . Diabetes mellitus without complication (HCC)    hx of lost weight due to cancer not on medications since 2014   . Dysrhythmia    atrial fib and atrial flutter  . History of chemotherapy last tx 2014  . History of diabetes mellitus NONE SINCE 2012 CANCER  . History of splenectomy   . Hyperlipemia   . Hypertension   . Neuromuscular disorder ([place])    peripheral neuropathy  . Neuropathy    right leg due to football injury  . Polysubstance abuse ([place])   . Sinus bradycardia     Past Surgical History:  Procedure Laterality Date  . A-FLUTTER ABLATION N/A 09/12/2016   Procedure: A-Flutter Ablation;  Surgeon: Will Meredith Leeds, MD;  Location: [place] [facility];  Service: Cardiovascular;  Laterality: N/A;  . AMPUTATION Right 10/20/2014   Procedure: AMPUTATION RIGHT GREAT TOE;  Surgeon: Gaynelle Arabian, MD;  Location: [facility];  Service: Orthopedics;  Laterality: Right;  . AMPUTATION Right 12/25/2014   Procedure: RIGHT 2ND TOE AMPUTATION ;  Surgeon: Wylene Simmer, MD;  Location:  [facility] [place];  Service: Orthopedics;  Laterality: Right;  . edentulation N/A y-2   prior to transplant  . EYE SURGERY    . right knee surgery Right    nerve graft back of leg  . SPLENECTOMY, TOTAL     mantle cell lymphoma  . TENOLYSIS Right 12/25/2014   Procedure: RIGHT 3,4,5 FLEXOR TENDON RELEASE ;  Surgeon: Wylene Simmer, MD;  Location: [facility];  Service: Orthopedics;  Laterality: Right;  . TOTAL KNEE ARTHROPLASTY Left 01/30/2017   Procedure: LEFT TOTAL KNEE ARTHROPLASTY;  Surgeon: Gaynelle Arabian, MD;  Location: [facility];  Service: Orthopedics;  Laterality: Left;     Current Outpatient Medications:  .  amLODipine (NORVASC) 10 MG tablet, Take 1 tablet (10 mg total) by mouth every morning., Disp: 90 tablet, Rfl: 1 .  atorvastatin (LIPITOR) 40 MG tablet, Take 1 tablet (40 mg total) by mouth daily., Disp: 90 tablet, Rfl: 1 .  diphenhydrAMINE (BENADRYL) 25 mg capsule, Take 1 capsule (25 mg total) by mouth every 6 (six) hours as needed for itching., Disp: 30 capsule, Rfl: 0 .  gabapentin (NEURONTIN) 300 MG capsule, Take 1 capsule (300 mg total) by mouth 3 (three) times daily., Disp: 270 capsule, Rfl: 5 .  lisinopril (PRINIVIL,ZESTRIL) 20 MG tablet, Take  1 tablet (20 mg total) by mouth daily., Disp: 90 tablet, Rfl: 3 .  penicillin v potassium (VEETID) 500 MG tablet, Take 500 mg by mouth 2 (two) times daily. , Disp: , Rfl:   Allergies  Allergen Reactions  . Oxycodone Itching and Palpitations         Objective:   Physical Exam  General: AAO x3, NAD  Dermatological: On the left hallux there appears to be some new, pink skin present there is some dry skin present as well in appearance this is where he is taking the scab or callus off the area.  However there is no definitive areas of skin breakdown identified bilaterally.  No significant swelling.        Vascular: Dorsalis Pedis artery and Posterior Tibial artery pedal pulses are 2/4 bilateral with immedate  capillary fill time.  There is no pain with calf compression, swelling, warmth, erythema.   Neruologic: Sensation decreased with Derrel Nip monofilament  Musculoskeletal: On the right fifth toe there is significant adductovarus present.  There is no area pinpoint tenderness identified bilaterally there is no edema, erythema, muscular strength 5/5 in all groups tested bilateral.  Gait: Unassisted, Nonantalgic.      Assessment & Plan:  57 year old male with history of amputation; digital deformity -Treatment options discussed including all alternatives, risks, and complications -Etiology of symptoms were discussed -X-rays were obtained and reviewed with the patient.  There is no evidence of acute fracture identified.  Arthritic changes present of the midfoot. -Discussed with him not to try to peel off dead skin to his feet. -Given digital deformity as well as history of irritation neuropathy I do think he will benefit from diabetic shoes.  Paperwork completed today for precertification diabetic shoes. -Discussed the importance of daily foot inspection.  Moisturizer daily but not interdigitally.  Trula Slade DPM

## 2018-03-21 DIAGNOSIS — Z96652 Presence of left artificial knee joint: Secondary | ICD-10-CM | POA: Insufficient documentation

## 2018-03-23 DIAGNOSIS — M1712 Unilateral primary osteoarthritis, left knee: Secondary | ICD-10-CM | POA: Diagnosis not present

## 2018-03-23 DIAGNOSIS — M545 Low back pain: Secondary | ICD-10-CM | POA: Diagnosis not present

## 2018-03-28 ENCOUNTER — Other Ambulatory Visit: Payer: Self-pay | Admitting: Family Medicine

## 2018-04-12 ENCOUNTER — Telehealth: Payer: Self-pay | Admitting: Podiatry

## 2018-04-12 DIAGNOSIS — M545 Low back pain: Secondary | ICD-10-CM | POA: Diagnosis not present

## 2018-04-12 NOTE — Telephone Encounter (Signed)
Pt returned call and is going to call his doctors office and schedule an appt with a md/do and will let me know when so I can send paperwork over to them for the diabetic shoes.

## 2018-04-12 NOTE — Telephone Encounter (Signed)
Left message for pt that he has to see md/do to sign off on the diabetic shoes. He was seen by FNP and medicare quidelines will not let them sign off on them.Marland KitchenMarland KitchenAsked pt to please call me with any questions and to let me know when he is scheduled to see the md/do.

## 2018-05-01 DIAGNOSIS — M5136 Other intervertebral disc degeneration, lumbar region: Secondary | ICD-10-CM | POA: Diagnosis not present

## 2018-05-09 DIAGNOSIS — H59022 Cataract (lens) fragments in eye following cataract surgery, left eye: Secondary | ICD-10-CM | POA: Diagnosis not present

## 2018-05-09 DIAGNOSIS — H2511 Age-related nuclear cataract, right eye: Secondary | ICD-10-CM | POA: Diagnosis not present

## 2018-05-09 DIAGNOSIS — H2702 Aphakia, left eye: Secondary | ICD-10-CM | POA: Diagnosis not present

## 2018-05-17 ENCOUNTER — Telehealth: Payer: Self-pay

## 2018-05-17 NOTE — Telephone Encounter (Signed)
Left a vm for patient letting him know that his appointment is tomorrow at 9am and to call reschedule if he is unable to make his appointment

## 2018-05-18 ENCOUNTER — Ambulatory Visit (HOSPITAL_COMMUNITY)
Admission: RE | Admit: 2018-05-18 | Discharge: 2018-05-18 | Disposition: A | Discharge status: Home / Self Care | Payer: Medicare HMO | Source: Ambulatory Visit | Attending: Family Medicine | Admitting: Family Medicine

## 2018-05-18 ENCOUNTER — Ambulatory Visit (INDEPENDENT_AMBULATORY_CARE_PROVIDER_SITE_OTHER): Payer: Medicare HMO | Admitting: Family Medicine

## 2018-05-18 ENCOUNTER — Encounter (HOSPITAL_COMMUNITY): Payer: Self-pay

## 2018-05-18 ENCOUNTER — Encounter: Payer: Self-pay | Admitting: Family Medicine

## 2018-05-18 VITALS — BP 124/86 | HR 60 | Temp 98.0°F | Resp 16 | Ht 73.0 in | Wt 215.0 lb

## 2018-05-18 DIAGNOSIS — I10 Essential (primary) hypertension: Secondary | ICD-10-CM | POA: Diagnosis not present

## 2018-05-18 DIAGNOSIS — E118 Type 2 diabetes mellitus with unspecified complications: Secondary | ICD-10-CM

## 2018-05-18 DIAGNOSIS — N183 Chronic kidney disease, stage 3 unspecified: Secondary | ICD-10-CM

## 2018-05-18 DIAGNOSIS — G629 Polyneuropathy, unspecified: Secondary | ICD-10-CM | POA: Diagnosis not present

## 2018-05-18 DIAGNOSIS — M25511 Pain in right shoulder: Secondary | ICD-10-CM | POA: Insufficient documentation

## 2018-05-18 DIAGNOSIS — R936 Abnormal findings on diagnostic imaging of limbs: Secondary | ICD-10-CM | POA: Insufficient documentation

## 2018-05-18 DIAGNOSIS — Z23 Encounter for immunization: Secondary | ICD-10-CM

## 2018-05-18 DIAGNOSIS — S98111A Complete traumatic amputation of right great toe, initial encounter: Secondary | ICD-10-CM | POA: Diagnosis not present

## 2018-05-18 MED ORDER — GABAPENTIN 600 MG PO TABS: 600.0000 mg | ORAL_TABLET | Freq: Three times a day (TID) | ORAL | 2 refills | Status: DC

## 2018-05-18 MED ORDER — PNEUMOCOCCAL 13-VAL CONJ VACC IM SUSP
0.5000 mL | Freq: Once | INTRAMUSCULAR | Status: AC
  Administered 2018-05-18: 0.5 mL via INTRAMUSCULAR

## 2018-05-18 NOTE — Patient Instructions (Addendum)
I have increased your neurontin to 600mg  three times a day to help with neuropathy. I also ordered a shoulder xray for you today.   Your PCP is Dr. Ned Clines, MD.    [facility] Eating Plan DASH stands for "Dietary Approaches to Stop Hypertension." The DASH eating plan is a healthy eating plan that has been shown to reduce high blood pressure (hypertension). It may also reduce your risk for type 2 diabetes, heart disease, and stroke. The DASH eating plan may also help with weight loss. What are tips for following this plan? General guidelines  Avoid eating more than 2,300 mg (milligrams) of salt (sodium) a day. If you have hypertension, you may need to reduce your sodium intake to 1,500 mg a day.  Limit alcohol intake to no more than 1 drink a day for nonpregnant women and 2 drinks a day for men. One drink equals 12 oz of beer, 5 oz of wine, or 1 oz of hard liquor.  Work with your health care provider to maintain a healthy body weight or to lose weight. Ask what an ideal weight is for you.  Get at least 30 minutes of exercise that causes your heart to beat faster (aerobic exercise) most days of the week. Activities may include walking, swimming, or biking.  Work with your health care provider or diet and nutrition specialist (dietitian) to adjust your eating plan to your individual calorie needs. Reading food labels  Check food labels for the amount of sodium per serving. Choose foods with less than 5 percent of the Daily Value of sodium. Generally, foods with less than 300 mg of sodium per serving fit into this eating plan.  To find whole grains, look for the word "whole" as the first word in the ingredient list. Shopping  Buy products labeled as "low-sodium" or "no salt added."  Buy fresh foods. Avoid canned foods and premade or frozen meals. Cooking  Avoid adding salt when cooking. Use salt-free seasonings or herbs instead of table salt or sea salt. Check with your health care provider  or pharmacist before using salt substitutes.  Do not fry foods. Cook foods using healthy methods such as baking, boiling, grilling, and broiling instead.  Cook with heart-healthy oils, such as olive, canola, soybean, or sunflower oil. Meal planning   Eat a balanced diet that includes: ? 5 or more servings of fruits and vegetables each day. At each meal, try to fill half of your plate with fruits and vegetables. ? Up to 6-8 servings of whole grains each day. ? Less than 6 oz of lean meat, poultry, or fish each day. A 3-oz serving of meat is about the same size as a deck of cards. One egg equals 1 oz. ? 2 servings of low-fat dairy each day. ? A serving of nuts, seeds, or beans 5 times each week. ? Heart-healthy fats. Healthy fats called Omega-3 fatty acids are found in foods such as flaxseeds and coldwater fish, like sardines, salmon, and mackerel.  Limit how much you eat of the following: ? Canned or prepackaged foods. ? Food that is high in trans fat, such as fried foods. ? Food that is high in saturated fat, such as fatty meat. ? Sweets, desserts, sugary drinks, and other foods with added sugar. ? Full-fat dairy products.  Do not salt foods before eating.  Try to eat at least 2 vegetarian meals each week.  Eat more home-cooked food and less restaurant, buffet, and fast food.  When eating  at a restaurant, ask that your food be prepared with less salt or no salt, if possible. What foods are recommended? The items listed may not be a complete list. Talk with your dietitian about what dietary choices are best for you. Grains Whole-grain or whole-wheat bread. Whole-grain or whole-wheat pasta. Brown rice. [place]. Bulgur. Whole-grain and low-sodium cereals. Pita bread. Low-fat, low-sodium crackers. Whole-wheat flour tortillas. Vegetables Fresh or frozen vegetables (raw, steamed, roasted, or grilled). Low-sodium or reduced-sodium tomato and vegetable juice. Low-sodium or  reduced-sodium tomato sauce and tomato paste. Low-sodium or reduced-sodium canned vegetables. Fruits All fresh, dried, or frozen fruit. Canned fruit in natural juice (without added sugar). Meat and other protein foods Skinless chicken or [place]. Ground chicken or [place]. Pork with fat trimmed off. Fish and seafood. Egg whites. Dried beans, peas, or lentils. Unsalted nuts, nut butters, and seeds. Unsalted canned beans. Lean cuts of beef with fat trimmed off. Low-sodium, lean deli meat. Dairy Low-fat (1%) or fat-free (skim) milk. Fat-free, low-fat, or reduced-fat cheeses. Nonfat, low-sodium ricotta or cottage cheese. Low-fat or nonfat yogurt. Low-fat, low-sodium cheese. Fats and oils Soft margarine without trans fats. Vegetable oil. Low-fat, reduced-fat, or light mayonnaise and salad dressings (reduced-sodium). Canola, safflower, olive, soybean, and sunflower oils. Avocado. Seasoning and other foods Herbs. Spices. Seasoning mixes without salt. Unsalted popcorn and pretzels. Fat-free sweets. What foods are not recommended? The items listed may not be a complete list. Talk with your dietitian about what dietary choices are best for you. Grains Baked goods made with fat, such as croissants, muffins, or some breads. Dry pasta or rice meal packs. Vegetables Creamed or fried vegetables. Vegetables in a cheese sauce. Regular canned vegetables (not low-sodium or reduced-sodium). Regular canned tomato sauce and paste (not low-sodium or reduced-sodium). Regular tomato and vegetable juice (not low-sodium or reduced-sodium). Angie Fava. Olives. Fruits Canned fruit in a light or heavy syrup. Fried fruit. Fruit in cream or butter sauce. Meat and other protein foods Fatty cuts of meat. Ribs. Fried meat. Berniece Salines. Sausage. Bologna and other processed lunch meats. Salami. Fatback. Hotdogs. Bratwurst. Salted nuts and seeds. Canned beans with added salt. Canned or smoked fish. Whole eggs or egg yolks. Chicken or [place]  with skin. Dairy Whole or 2% milk, cream, and half-and-half. Whole or full-fat cream cheese. Whole-fat or sweetened yogurt. Full-fat cheese. Nondairy creamers. Whipped toppings. Processed cheese and cheese spreads. Fats and oils Butter. Stick margarine. Lard. Shortening. Ghee. Bacon fat. Tropical oils, such as coconut, palm kernel, or palm oil. Seasoning and other foods Salted popcorn and pretzels. Onion salt, garlic salt, seasoned salt, table salt, and sea salt. Worcestershire sauce. Tartar sauce. Barbecue sauce. Teriyaki sauce. Soy sauce, including reduced-sodium. Steak sauce. Canned and packaged gravies. Fish sauce. Oyster sauce. Cocktail sauce. Horseradish that you find on the shelf. Ketchup. Mustard. Meat flavorings and tenderizers. Bouillon cubes. Hot sauce and Tabasco sauce. Premade or packaged marinades. Premade or packaged taco seasonings. Relishes. Regular salad dressings. Where to find more information:  National Heart, Lung, and [place]: https://wilson-eaton.com/  American Heart Association: www.heart.org Summary  The DASH eating plan is a healthy eating plan that has been shown to reduce high blood pressure (hypertension). It may also reduce your risk for type 2 diabetes, heart disease, and stroke.  With the DASH eating plan, you should limit salt (sodium) intake to 2,300 mg a day. If you have hypertension, you may need to reduce your sodium intake to 1,500 mg a day.  When on the DASH eating plan, aim to  eat more fresh fruits and vegetables, whole grains, lean proteins, low-fat dairy, and heart-healthy fats.  Work with your health care provider or diet and nutrition specialist (dietitian) to adjust your eating plan to your individual calorie needs. This information is not intended to replace advice given to you by your health care provider. Make sure you discuss any questions you have with your health care provider. Document Released: 07/14/2011 Document Revised: 07/18/2016  Document Reviewed: 07/18/2016 Elsevier Interactive Patient Education  [employer].

## 2018-05-18 NOTE — Progress Notes (Signed)
[facility]  Provider: Lanae Boast, FNP   Hypertension Follow Up Visit  SUBJECTIVE:  Cory Marks is a 57 y.o. male who  has a past medical history of Arthritis, Bell's palsy, CAD (coronary artery disease), Cancer ([place]) (2012), CKD (chronic kidney disease) stage 3, GFR 30-59 ml/min ([place]), Diabetes mellitus without complication ([place]), Dysrhythmia, History of chemotherapy (last tx 2014), History of diabetes mellitus (NONE SINCE 2012 CANCER), History of splenectomy, Hyperlipemia, Hypertension, Neuromuscular disorder ([place]), Neuropathy, Polysubstance abuse ([place]), and Sinus bradycardia. .   New concerns: Patient with a history of right great toe amputation in March 2016.  Second toe amputation was performed on the right foot second digit a few months after..  Both due to osteomyelitis.  he states that he needs medical doctor to write him an order for orthopedic shoe for the right foot due to this.  An order was placed in April 2019 by the former nurse practitioner. Patient's BUN and creatinine were both elevated at the last visit.  He has a history of stage II chronic kidney disease.  We will do CMP today. Current Outpatient Medications  Medication Sig Dispense Refill  . amLODipine (NORVASC) 10 MG tablet Take 1 tablet (10 mg total) by mouth every morning. 90 tablet 1  . atorvastatin (LIPITOR) 40 MG tablet Take 1 tablet (40 mg total) by mouth daily. 90 tablet 1  . diphenhydrAMINE (BENADRYL) 25 mg capsule Take 1 capsule (25 mg total) by mouth every 6 (six) hours as needed for itching. 30 capsule 0  . gabapentin (NEURONTIN) 300 MG capsule Take 1 capsule (300 mg total) by mouth 3 (three) times daily. 270 capsule 5  . lisinopril (PRINIVIL,ZESTRIL) 20 MG tablet TAKE 1 TABLET EVERY DAY 90 tablet 3  . penicillin v potassium (VEETID) 500 MG tablet Take 500 mg by mouth 2 (two) times daily.      No current facility-administered medications for this  visit.     No results found for this or any previous visit (from the past 2160 hour(s)).  Hypertension ROS: ROS   OBJECTIVE:   BP 124/86 (BP Location: Right Arm, Patient Position: Sitting, Cuff Size: Normal)   Pulse 60   Temp 98 F (36.7 C) (Oral)   Resp 16   Ht 6\' 1"  (1.854 m)   Wt 215 lb (97.5 kg)   SpO2 100%   BMI 28.37 kg/m   Physical Exam  Constitutional: He is oriented to person, place, and time.  HENT:  Head: Normocephalic and atraumatic.  Eyes: Pupils are equal, round, and reactive to light. Conjunctivae are normal.  Neck: Normal range of motion.  Cardiovascular: Normal rate, regular rhythm, normal heart sounds and intact distal pulses.  Pulmonary/Chest: Effort normal and breath sounds normal.  Musculoskeletal: He exhibits tenderness (right shoulder) and deformity (of right shoulder. Amputation of the great toe and second digit of right foot. ). He exhibits no edema.  Neurological: He is alert and oriented to person, place, and time.  Skin: Skin is warm and dry.  Psychiatric: He has a normal mood and affect. His behavior is normal. Judgment and thought content normal.  Nursing note and vitals reviewed.        ASSESSMENT/PLAN:  1. Essential hypertension Pending labs. Will adjust medications accordingly.   - Urinalysis, Routine w reflex microscopic - Comprehensive metabolic panel - pneumococcal 13-valent conjugate vaccine (PREVNAR 13) injection 0.5 mL  2. Right shoulder pain, unspecified chronicity - DG Shoulder Right; Future  3. Neuropathy Increased gabapentin to 600mg  TID.   4. Influenza vaccination administered at current visit Influenza vaccination given in the office visit today.  - HM INFLUENZA SHOT  5. Amputation of right great toe ([place]) - PR ORTHOPEDIC MENS SHOES DPTH I    The patient is asked to make an attempt to improve diet and exercise patterns to aid in medical management of this problem.  Return to care as scheduled and prn. Patient  verbalized understanding and agreed with plan of care.    Ms. Cory Marks. Cory Canary, FNP-BC [facility] [address] [place], [place] [address] [phone]

## 2018-05-19 LAB — COMPREHENSIVE METABOLIC PANEL
ALT: 18 IU/L (ref 0–44)
AST: 17 IU/L (ref 0–40)
Albumin/Globulin Ratio: 2.6 — ABNORMAL HIGH (ref 1.2–2.2)
Albumin: 4.5 g/dL (ref 3.5–5.5)
Alkaline Phosphatase: 46 IU/L (ref 39–117)
BUN/Creatinine Ratio: 13 (ref 9–20)
BUN: 27 mg/dL — ABNORMAL HIGH (ref 6–24)
Bilirubin Total: 1.1 mg/dL (ref 0.0–1.2)
CO2: 22 mmol/L (ref 20–29)
Calcium: 9.6 mg/dL (ref 8.7–10.2)
Chloride: 102 mmol/L (ref 96–106)
Creatinine, Ser: 2.12 mg/dL — ABNORMAL HIGH (ref 0.76–1.27)
GFR calc Af Amer: 38 mL/min/{1.73_m2} — ABNORMAL LOW (ref >59)
GFR calc non Af Amer: 33 mL/min/{1.73_m2} — ABNORMAL LOW (ref >59)
Globulin, Total: 1.7 g/dL (ref 1.5–4.5)
Glucose: 81 mg/dL (ref 65–99)
Potassium: 4.3 mmol/L (ref 3.5–5.2)
Sodium: 140 mmol/L (ref 134–144)
Total Protein: 6.2 g/dL (ref 6.0–8.5)

## 2018-05-19 LAB — URINALYSIS, ROUTINE W REFLEX MICROSCOPIC
Bilirubin, UA: NEGATIVE
Ketones, UA: NEGATIVE
Leukocytes, UA: NEGATIVE
Nitrite, UA: NEGATIVE
RBC, UA: NEGATIVE
Specific Gravity, UA: 1.016 (ref 1.005–1.030)
Urobilinogen, Ur: 0.2 mg/dL (ref 0.2–1.0)
pH, UA: 6 (ref 5.0–7.5)

## 2018-05-22 DIAGNOSIS — Z96652 Presence of left artificial knee joint: Secondary | ICD-10-CM | POA: Diagnosis not present

## 2018-05-22 DIAGNOSIS — E785 Hyperlipidemia, unspecified: Secondary | ICD-10-CM | POA: Diagnosis not present

## 2018-05-22 DIAGNOSIS — E1142 Type 2 diabetes mellitus with diabetic polyneuropathy: Secondary | ICD-10-CM | POA: Diagnosis not present

## 2018-05-22 DIAGNOSIS — I10 Essential (primary) hypertension: Secondary | ICD-10-CM | POA: Diagnosis not present

## 2018-05-22 DIAGNOSIS — Z008 Encounter for other general examination: Secondary | ICD-10-CM | POA: Diagnosis not present

## 2018-05-22 DIAGNOSIS — G510 Bell's palsy: Secondary | ICD-10-CM | POA: Diagnosis not present

## 2018-05-22 DIAGNOSIS — Z8679 Personal history of other diseases of the circulatory system: Secondary | ICD-10-CM | POA: Diagnosis not present

## 2018-05-22 DIAGNOSIS — Z0000 Encounter for general adult medical examination without abnormal findings: Secondary | ICD-10-CM | POA: Diagnosis not present

## 2018-05-22 DIAGNOSIS — S98131A Complete traumatic amputation of one right lesser toe, initial encounter: Secondary | ICD-10-CM | POA: Diagnosis not present

## 2018-06-03 NOTE — Addendum Note (Signed)
Addended by: Genelle Bal on: 06/03/2018 07:14 AM   Modules accepted: Orders

## 2018-06-13 ENCOUNTER — Ambulatory Visit: Payer: Medicare HMO | Admitting: Podiatry

## 2018-06-19 ENCOUNTER — Ambulatory Visit: Payer: Medicare HMO | Admitting: Internal Medicine

## 2018-06-20 ENCOUNTER — Telehealth: Payer: Self-pay

## 2018-06-20 ENCOUNTER — Other Ambulatory Visit: Payer: Self-pay | Admitting: Family Medicine

## 2018-06-20 DIAGNOSIS — M25511 Pain in right shoulder: Secondary | ICD-10-CM

## 2018-06-20 DIAGNOSIS — M75121 Complete rotator cuff tear or rupture of right shoulder, not specified as traumatic: Secondary | ICD-10-CM

## 2018-06-20 NOTE — Telephone Encounter (Signed)
-----   Message from Lanae Boast, [place] sent at 06/20/2018  6:57 AM EST ----- I am ordering a MRI for further evaluation as suggested by the radiologist. There may be a rotator cuff tear. This would not be visible on an Xray.

## 2018-06-20 NOTE — Telephone Encounter (Signed)
Called and spoke with patient. Advised that a MRI needs to be done to rule out rotator cuff tear. This has been scheduled for 06/26/2018 @5 :00pm. Patient is aware. Thanks!

## 2018-06-26 ENCOUNTER — Ambulatory Visit (HOSPITAL_COMMUNITY): Payer: Medicare HMO

## 2018-07-09 ENCOUNTER — Ambulatory Visit (HOSPITAL_COMMUNITY)
Admission: RE | Admit: 2018-07-09 | Discharge: 2018-07-09 | Disposition: A | Discharge status: Home / Self Care | Payer: Medicare HMO | Source: Ambulatory Visit | Attending: Family Medicine | Admitting: Family Medicine

## 2018-07-09 ENCOUNTER — Telehealth: Payer: Self-pay

## 2018-07-09 DIAGNOSIS — M75121 Complete rotator cuff tear or rupture of right shoulder, not specified as traumatic: Secondary | ICD-10-CM | POA: Diagnosis not present

## 2018-07-09 DIAGNOSIS — M19011 Primary osteoarthritis, right shoulder: Secondary | ICD-10-CM | POA: Insufficient documentation

## 2018-07-09 DIAGNOSIS — M25511 Pain in right shoulder: Secondary | ICD-10-CM | POA: Insufficient documentation

## 2018-07-09 NOTE — Telephone Encounter (Signed)
-----   Message from Lanae Boast, Edith Endave sent at 07/09/2018 10:52 AM EST ----- Please let patient know that he has a rotator cuff tear in the right shoulder. I will refer to ortho for further evaluation .

## 2018-07-09 NOTE — Telephone Encounter (Signed)
Called, no answer. Left a message for patient to call back. Thanks!  

## 2018-07-09 NOTE — Addendum Note (Signed)
Addended by: Genelle Bal on: 07/09/2018 10:54 AM   Modules accepted: Orders

## 2018-07-09 NOTE — Progress Notes (Signed)
Please let patient know that he has a rotator cuff tear in the right shoulder. I will refer to ortho for further evaluation .

## 2018-07-10 ENCOUNTER — Other Ambulatory Visit: Payer: Self-pay | Admitting: Nephrology

## 2018-07-10 DIAGNOSIS — G510 Bell's palsy: Secondary | ICD-10-CM

## 2018-07-10 DIAGNOSIS — D631 Anemia in chronic kidney disease: Secondary | ICD-10-CM

## 2018-07-10 DIAGNOSIS — Z9081 Acquired absence of spleen: Secondary | ICD-10-CM

## 2018-07-10 DIAGNOSIS — I129 Hypertensive chronic kidney disease with stage 1 through stage 4 chronic kidney disease, or unspecified chronic kidney disease: Secondary | ICD-10-CM

## 2018-07-10 DIAGNOSIS — N2581 Secondary hyperparathyroidism of renal origin: Secondary | ICD-10-CM

## 2018-07-10 DIAGNOSIS — N183 Chronic kidney disease, stage 3 unspecified: Secondary | ICD-10-CM

## 2018-07-10 DIAGNOSIS — N189 Chronic kidney disease, unspecified: Secondary | ICD-10-CM

## 2018-07-10 NOTE — Telephone Encounter (Signed)
Called and spoke with patient. Advised of torn rotator cuff and that we are sending him to orhto for this. Patient verbalized understanding. Thanks!

## 2018-07-11 ENCOUNTER — Encounter: Payer: Self-pay | Admitting: Podiatry

## 2018-07-11 ENCOUNTER — Ambulatory Visit: Payer: Medicare HMO | Admitting: Podiatry

## 2018-07-11 VITALS — BP 168/110

## 2018-07-11 DIAGNOSIS — E1142 Type 2 diabetes mellitus with diabetic polyneuropathy: Secondary | ICD-10-CM

## 2018-07-11 DIAGNOSIS — E11621 Type 2 diabetes mellitus with foot ulcer: Secondary | ICD-10-CM

## 2018-07-11 DIAGNOSIS — M79674 Pain in right toe(s): Secondary | ICD-10-CM

## 2018-07-11 DIAGNOSIS — L97511 Non-pressure chronic ulcer of other part of right foot limited to breakdown of skin: Secondary | ICD-10-CM | POA: Diagnosis not present

## 2018-07-11 DIAGNOSIS — M79675 Pain in left toe(s): Secondary | ICD-10-CM

## 2018-07-11 DIAGNOSIS — B449 Aspergillosis, unspecified: Secondary | ICD-10-CM | POA: Insufficient documentation

## 2018-07-11 DIAGNOSIS — R1312 Dysphagia, oropharyngeal phase: Secondary | ICD-10-CM | POA: Insufficient documentation

## 2018-07-11 DIAGNOSIS — B351 Tinea unguium: Secondary | ICD-10-CM | POA: Diagnosis not present

## 2018-07-11 NOTE — Progress Notes (Signed)
Subjective: Cory Marks presents with diabetes, diabetic neuropathy, amputations right hallux right and second toe.  He is here for high risk foot care.  He currently has a pre-ulcerative lesion noted on the right fifth digit.  Objective: Vitals:   07/11/18 0924  BP: (!) 168/110   Vascular Examination: Capillary refill time immediate x 10 digits Dorsalis pedis and Posterior tibial pulses present b/l No digital hair x 10 digits Skin temperature within normal limits b/l  Dermatological Examination: Skin with normal turgor, texture and tone b/l  Toenails 3-5 right, 1-5 left  and digits  are discolored, thick, dystrophic with subungual debris and pain with palpation to nailbeds due to thickness of nails.  Hyperkeratotic lesion lateral aspect of right fifth digit.  There is a moderate amount of hyperkeratotic tissue with subdermal hemorrhage present.  There is no flocculence, no erythema, no edema noted.  Musculoskeletal: Muscle strength 5/5 to all LE muscle groups  Amputation noted right great toe and right 2nd digit  Neurological: Sensation diminished with 10 gram monofilament. Vibratory sensation diminished  Assessment: 1. Painful onychomycosis toenails 3-5 right, 1-5 left  2. Pre-ulcerative lesion noted right fifth digit 3. NIDDM with Diabetic neuropathy 4.   Amputation status right great toe, right 2nd toe  Plan: 1. Continue diabetic foot care principles.  2. Toenails 3-5 right, 1-5 left were debrided in length and girth without iatrogenic bleeding. 3. Pre-ulcerative lesion pared right 5th digit with sterile chisel blade.  Gently smoothed with burr.  Silicone digital toe dispensed today he is to apply every morning and remove every evening. Cory Marks related understanding. 4. Patient to continue soft, supportive shoe gear 5. Patient to report any pedal injuries to medical professional  6. Follow up 3 months. Patient/POA to call should there be a concern in the  interim.

## 2018-07-11 NOTE — Patient Instructions (Signed)
Diabetes and Foot Care Diabetes may cause you to have problems because of poor blood supply (circulation) to your feet and legs. This may cause the skin on your feet to become thinner, break easier, and heal more slowly. Your skin may become dry, and the skin may peel and crack. You may also have nerve damage in your legs and feet causing decreased feeling in them. You may not notice minor injuries to your feet that could lead to infections or more serious problems. Taking care of your feet is one of the most important things you can do for yourself. Follow these instructions at home:  Wear shoes at all times, even in the house. Do not go barefoot. Bare feet are easily injured.  Check your feet daily for blisters, cuts, and redness. If you cannot see the bottom of your feet, use a mirror or ask someone for help.  Wash your feet with warm water (do not use hot water) and mild soap. Then pat your feet and the areas between your toes until they are completely dry. Do not soak your feet as this can dry your skin.  Apply a moisturizing lotion or petroleum jelly (that does not contain alcohol and is unscented) to the skin on your feet and to dry, brittle toenails. Do not apply lotion between your toes.  Trim your toenails straight across. Do not dig under them or around the cuticle. File the edges of your nails with an emery board or nail file.  Do not cut corns or calluses or try to remove them with medicine.  Wear clean socks or stockings every day. Make sure they are not too tight. Do not wear knee-high stockings since they may decrease blood flow to your legs.  Wear shoes that fit properly and have enough cushioning. To break in new shoes, wear them for just a few hours a day. This prevents you from injuring your feet. Always look in your shoes before you put them on to be sure there are no objects inside.  Do not cross your legs. This may decrease the blood flow to your feet.  If you find a  minor scrape, cut, or break in the skin on your feet, keep it and the skin around it clean and dry. These areas may be cleansed with mild soap and water. Do not cleanse the area with peroxide, alcohol, or iodine.  When you remove an adhesive bandage, be sure not to damage the skin around it.  If you have a wound, look at it several times a day to make sure it is healing.  Do not use heating pads or hot water bottles. They may burn your skin. If you have lost feeling in your feet or legs, you may not know it is happening until it is too late.  Make sure your health care provider performs a complete foot exam at least annually or more often if you have foot problems. Report any cuts, sores, or bruises to your health care provider immediately. Contact a health care provider if:  You have an injury that is not healing.  You have cuts or breaks in the skin.  You have an ingrown nail.  You notice redness on your legs or feet.  You feel burning or tingling in your legs or feet.  You have pain or cramps in your legs and feet.  Your legs or feet are numb.  Your feet always feel cold. Get help right away if:  There is increasing   redness, swelling, or pain in or around a wound.  There is a red line that goes up your leg.  Pus is coming from a wound.  You develop a fever or as directed by your health care provider.  You notice a bad smell coming from an ulcer or wound. This information is not intended to replace advice given to you by your health care provider. Make sure you discuss any questions you have with your health care provider. Document Released: 07/22/2000 Document Revised: 12/31/2015 Document Reviewed: 01/01/2013 Elsevier Interactive Patient Education  2017 [employer]  Diabetic Neuropathy Diabetic neuropathy is a nerve disease or nerve damage that is caused by diabetes mellitus. About half of all people with diabetes mellitus have some form of nerve damage. Nerve damage  is more common in those who have had diabetes mellitus for many years and who generally have not had good control of their blood sugar (glucose) level. Diabetic neuropathy is a common complication of diabetes mellitus. There are three common types of diabetic neuropathy and a fourth type that is less common and less understood:  Peripheral neuropathy-This is the most common type of diabetic neuropathy. It causes damage to the nerves of the feet and legs first and then eventually the hands and arms. The damage affects the ability to sense touch.  Autonomic neuropathy-This type causes damage to the autonomic nervous system, which controls the following functions: ? Heartbeat. ? Body temperature. ? Blood pressure. ? Urination. ? Digestion. ? Sweating. ? Sexual function.  Focal neuropathy-Focal neuropathy can be painful and unpredictable and occurs most often in older adults with diabetes mellitus. It involves a specific nerve or one area and often comes on suddenly. It usually does not cause long-term problems.  Radiculoplexus neuropathy- Sometimes called lumbosacral radiculoplexus neuropathy, radiculoplexus neuropathy affects the nerves of the thighs, hips, buttocks, or legs. It is more common in people with type 2 diabetes mellitus and in older men. It is characterized by debilitating pain, weakness, and atrophy, usually in the thigh muscles.  What are the causes? The cause of peripheral, autonomic, and focal neuropathies is diabetes mellitus that is uncontrolled and high glucose levels. The cause of radiculoplexus neuropathy is unknown. However, it is thought to be caused by inflammation related to uncontrolled glucose levels. What are the signs or symptoms? Peripheral Neuropathy Peripheral neuropathy develops slowly over time. When the nerves of the feet and legs no longer work there may be:  Burning, stabbing, or aching pain in the legs or feet.  Inability to feel pressure or pain in your  feet. This can lead to: ? Thick calluses over pressure areas. ? Pressure sores. ? Ulcers.  Foot deformities.  Reduced ability to feel temperature changes.  Muscle weakness.  Autonomic Neuropathy The symptoms of autonomic neuropathy vary depending on which nerves are affected. Symptoms may include:  Problems with digestion, such as: ? Feeling sick to your stomach (nausea). ? Vomiting. ? Bloating. ? Constipation. ? Diarrhea. ? Abdominal pain.  Difficulty with urination. This occurs if you lose your ability to sense when your bladder is full. Problems include: ? Urine leakage (incontinence). ? Inability to empty your bladder completely (retention).  Rapid or irregular heartbeat (palpitations).  Blood pressure drops when you stand up (orthostatic hypotension). When you stand up you may feel: ? Dizzy. ? Weak. ? Faint.  In men, inability to attain and maintain an erection.  In women, vaginal dryness and problems with decreased sexual desire and arousal.  Problems with body temperature   regulation.  Increased or decreased sweating.  Focal Neuropathy  Abnormal eye movements or abnormal alignment of both eyes.  Weakness in the wrist.  Foot drop. This results in an inability to lift the foot properly and abnormal walking or foot movement.  Paralysis on one side of your face (Bell palsy).  Chest or abdominal pain. Radiculoplexus Neuropathy  Sudden, severe pain in your hip, thigh, or buttocks.  Weakness and wasting of thigh muscles.  Difficulty rising from a seated position.  Abdominal swelling.  Unexplained weight loss (usually more than 10 lb [4.5 kg]). How is this diagnosed? Peripheral Neuropathy Your senses may be tested. Sensory function testing can be done with:  A light touch using a monofilament.  A vibration with tuning fork.  A sharp sensation with a pin prick.  Other tests that can help diagnose neuropathy are:  Nerve conduction velocity. This  test checks the transmission of an electrical current through a nerve.  Electromyography. This shows how muscles respond to electrical signals transmitted by nearby nerves.  Quantitative sensory testing. This is used to assess how your nerves respond to vibrations and changes in temperature.  Autonomic Neuropathy Diagnosis is often based on reported symptoms. Tell your health care provider if you experience:  Dizziness.  Constipation.  Diarrhea.  Inappropriate urination or inability to urinate.  Inability to get or maintain an erection.  Tests that may be done include:  Electrocardiography or Holter monitor. These are tests that can help show problems with the heart rate or heart rhythm.  An X-ray exam may be done.  Focal Neuropathy Diagnosis is made based on your symptoms and what your health care provider finds during your exam. Other tests may be done. They may include:  Nerve conduction velocities. This checks the transmission of electrical current through a nerve.  Electromyography. This shows how muscles respond to electrical signals transmitted by nearby nerves.  Quantitative sensory testing. This test is used to assess how your nerves respond to vibration and changes in temperature.  Radiculoplexus Neuropathy  Often the first thing is to eliminate any other issue or problems that might be the cause, as there is no standard test for diagnosis.  X-ray exam of your spine and lumbar region.  Spinal tap to rule out cancer.  MRI to rule out other lesions. How is this treated? Once nerve damage occurs, it cannot be reversed. The goal of treatment is to keep the disease or nerve damage from getting worse and affecting more nerve fibers. Controlling your blood glucose level is the key. Most people with radiculoplexus neuropathy see at least a partial improvement over time. You will need to keep your blood glucose and HbA1c levels in the target range determined by your  health care provider. Things that help control blood glucose levels include:  Blood glucose monitoring.  Meal planning.  Physical activity.  Diabetes medicine.  Over time, maintaining lower blood glucose levels helps lessen symptoms. Sometimes, prescription pain medicine is needed. Follow these instructions at home:  Do not smoke.  Keep your blood glucose level in the range that you and your health care provider have determined acceptable for you.  Keep your blood pressure level in the range that you and your health care provider have determined acceptable for you.  Eat a well-balanced diet.  Be physically active every day. Include strength training and balance exercises.  Protect your feet. ? Check your feet every day for sores, cuts, blisters, or signs of infection. ? Wear padded socks   and supportive shoes. Use orthotic inserts, if necessary. ? Regularly check the insides of your shoes for worn spots. Make sure there are no rocks or other items inside your shoes before you put them on. Contact a health care provider if:  You have burning, stabbing, or aching pain in the legs or feet.  You are unable to feel pressure or pain in your feet.  You develop problems with digestion such as: ? Nausea. ? Vomiting. ? Bloating. ? Constipation. ? Diarrhea. ? Abdominal pain.  You have difficulty with urination, such as: ? Incontinence. ? Retention.  You have palpitations.  You develop orthostatic hypotension. When you stand up you may feel: ? Dizzy. ? Weak. ? Faint.  You cannot attain and maintain an erection (in men).  You have vaginal dryness and problems with decreased sexual desire and arousal (in women).  You have severe pain in your thighs, legs, or buttocks.  You have unexplained weight loss. This information is not intended to replace advice given to you by your health care provider. Make sure you discuss any questions you have with your health care  provider. Document Released: 10/03/2001 Document Revised: 12/31/2015 Document Reviewed: 01/03/2013 Elsevier Interactive Patient Education  2017 Elsevier Inc.  

## 2018-07-16 ENCOUNTER — Other Ambulatory Visit: Payer: Medicare HMO

## 2018-07-17 ENCOUNTER — Ambulatory Visit (INDEPENDENT_AMBULATORY_CARE_PROVIDER_SITE_OTHER): Payer: Medicare HMO | Admitting: Orthopaedic Surgery

## 2018-07-23 ENCOUNTER — Other Ambulatory Visit: Payer: Medicare HMO

## 2018-07-24 ENCOUNTER — Encounter (INDEPENDENT_AMBULATORY_CARE_PROVIDER_SITE_OTHER): Payer: Self-pay | Admitting: Orthopaedic Surgery

## 2018-07-24 ENCOUNTER — Ambulatory Visit (INDEPENDENT_AMBULATORY_CARE_PROVIDER_SITE_OTHER): Payer: Medicare HMO | Admitting: Orthopaedic Surgery

## 2018-07-24 DIAGNOSIS — M12811 Other specific arthropathies, not elsewhere classified, right shoulder: Secondary | ICD-10-CM | POA: Diagnosis not present

## 2018-07-24 MED ORDER — METHYLPREDNISOLONE ACETATE 40 MG/ML IJ SUSP
40.0000 mg | INTRAMUSCULAR | Status: AC | PRN
  Administered 2018-07-24: 40 mg via INTRA_ARTICULAR

## 2018-07-24 MED ORDER — BUPIVACAINE HCL 0.5 % IJ SOLN
3.0000 mL | INTRAMUSCULAR | Status: AC | PRN
  Administered 2018-07-24: 3 mL via INTRA_ARTICULAR

## 2018-07-24 MED ORDER — LIDOCAINE HCL 1 % IJ SOLN
3.0000 mL | INTRAMUSCULAR | Status: AC | PRN
  Administered 2018-07-24: 3 mL

## 2018-07-24 NOTE — Progress Notes (Signed)
Office Visit Note   Patient: Cory Marks           Date of Birth: Jul 16, 1961           MRN: [id number] Visit Date: 07/24/2018              Requested by: Lanae Boast, [address], [place] [address] PCP: Tsosie Billing, MD   Assessment & Plan: Visit Diagnoses:  1. Rotator cuff arthropathy of right shoulder     Plan: Impression is advanced right shoulder rotator cuff arthropathy.  MRI is consistent with these findings.  Unfortunately at this point he is not a candidate for rotator cuff repair and given his significant level of glenohumeral arthrosis he is not a candidate for superior capsular reconstruction.  At this point his only real good surgical option is a reverse shoulder replacement but given his age I have recommended that he try to wait as long as he can.  We did perform an injection today into the subacromial space to hopefully give him pain relief.  I would also like to get him set up with physical therapy.  Questions encouraged and answered.  Follow-up as needed.  Follow-Up Instructions: Return if symptoms worsen or fail to improve.   Orders:  No orders of the defined types were placed in this encounter.  No orders of the defined types were placed in this encounter.     Procedures: Large Joint Inj: R subacromial bursa on 07/24/2018 8:46 AM Indications: pain Details: 22 G needle  Arthrogram: No  Medications: 3 mL lidocaine 1 %; 3 mL bupivacaine 0.5 %; 40 mg methylPREDNISolone acetate 40 MG/ML Outcome: tolerated well, no immediate complications Consent was given by the patient. Patient was prepped and draped in the usual sterile fashion.       Clinical Data: No additional findings.   Subjective: Chief Complaint  Patient presents with  . Right Shoulder - Pain    Cory Marks is a 57 year old gentleman who is right-hand dominant who comes in for chronic right shoulder pain that has gotten worse over the last 2 months.  He first  started having problems about a year and a half ago.  He denies any injuries.  He states that he is having trouble with raising his arm and with weakness with overhead activities.  He is currently on disability for mantle cell lymphoma.  He has not had any surgeries to his right shoulder in the past.   Review of Systems  Constitutional: Negative.   All other systems reviewed and are negative.    Objective: Vital Signs: There were no vitals taken for this visit.  Physical Exam Vitals signs and nursing note reviewed.  Constitutional:      Appearance: He is well-developed.  HENT:     Head: Normocephalic and atraumatic.  Eyes:     Pupils: Pupils are equal, round, and reactive to light.  Neck:     Musculoskeletal: Neck supple.  Pulmonary:     Effort: Pulmonary effort is normal.  Abdominal:     Palpations: Abdomen is soft.  Musculoskeletal: Normal range of motion.  Skin:    General: Skin is warm.  Neurological:     Mental Status: He is alert and oriented to person, place, and time.  Psychiatric:        Behavior: Behavior normal.        Thought Content: Thought content normal.        Judgment: Judgment normal.  Ortho Exam Right shoulder exam is consistent with chronic rotator cuff arthropathy.  He is able to raise his arm against gravity but he does have weakness against resistance to supraspinatus, infraspinatus, subscapularis.  His range of motion is normal. Specialty Comments:  No specialty comments available.  Imaging: No results found.   PMFS History: Patient Active Problem List   Diagnosis Date Noted  . Oropharyngeal dysphagia 07/11/2018  . Pneumonia in aspergillosis ([place]) 07/11/2018  . Degeneration of lumbar intervertebral disc 05/01/2018  . History of total knee replacement, left 03/21/2018  . Age-related nuclear cataract of right eye 01/01/2018  . Aphakia of eye, left 01/01/2018  . Ocular hypertension of left eye 01/01/2018  . Retained lens material  following cataract surgery of left eye 09/22/2017  . OA (osteoarthritis) of knee 01/30/2017  . Right foot pain 11/04/2016  . Great toe amputation status, right 11/04/2016  . Neuropathy 11/04/2016  . Typical atrial flutter ([place]) 09/12/2016  . Encounter for therapeutic drug monitoring 04/18/2016  . H/O asplenia 06/15/2015  . Chronic systolic congestive heart failure ([place]) 10/20/2014  . Osteomyelitis ([place]) 10/19/2014  . Osteomyelitis of toe of right foot ([place]) 10/17/2014  . Septic arthritis of interphalangeal joint of toe of right foot ([place]) 10/17/2014  . Atrial flutter ([place]) 10/17/2014  . History of lymphoma 05/30/2014  . CKD (chronic kidney disease) stage 3, GFR 30-59 ml/min (HCC) 02/27/2014  . Hyperlipidemia 01/23/2014  . Foot ulcer, right ([place]) 01/23/2014  . Right foot drop 01/23/2014  . CAD in native artery 01/23/2014  . Metabolic syndrome 53/61/4431  . Encounter for screening colonoscopy 12/26/2013  . History of bradycardia 12/26/2013  . HTN (hypertension) 09/12/2012  . Lymphoma ([place]) 09/12/2012  . Diabetic peripheral neuropathy ([place]) 12/05/2011  . Erectile dysfunction 12/05/2011  . H/O autologous stem cell transplant ([place]) 05/30/2011  . Mantle cell lymphoma ([place]) 05/05/2011   Past Medical History:  Diagnosis Date  . Arthritis   . Bell's palsy   . CAD (coronary artery disease)   . Cancer [facility]) 2012   Mantle Cell Cancer- S/P stem cell transplant  . CKD (chronic kidney disease) stage 3, GFR 30-59 ml/min (HCC)   . Diabetes mellitus without complication (HCC)    hx of lost weight due to cancer not on medications since 2014   . Dysrhythmia    atrial fib and atrial flutter  . History of chemotherapy last tx 2014  . History of diabetes mellitus NONE SINCE 2012 CANCER  . History of splenectomy   . Hyperlipemia   . Hypertension   . Neuromuscular disorder ([place])    peripheral neuropathy  . Neuropathy    right leg due to football injury  . Polysubstance abuse ([place])   . Sinus  bradycardia     Family History  Problem Relation Age of Onset  . Hypertension Mother   . Diabetes Mother   . Cancer Father        pancreatic cancer  . Diabetes Father   . Heart disease Father   . Diabetes Sister     Past Surgical History:  Procedure Laterality Date  . A-FLUTTER ABLATION N/A 09/12/2016   Procedure: A-Flutter Ablation;  Surgeon: Will Meredith Leeds, MD;  Location: [facility];  Service: Cardiovascular;  Laterality: N/A;  . AMPUTATION Right 10/20/2014   Procedure: AMPUTATION RIGHT GREAT TOE;  Surgeon: Gaynelle Arabian, MD;  Location: [facility];  Service: Orthopedics;  Laterality: Right;  . AMPUTATION Right 12/25/2014   Procedure: RIGHT 2ND TOE AMPUTATION ;  Surgeon: Wylene Simmer, MD;  Location: [facility];  Service: Orthopedics;  Laterality: Right;  . edentulation N/A y-2   prior to transplant  . EYE SURGERY    . right knee surgery Right    nerve graft back of leg  . SPLENECTOMY, TOTAL     mantle cell lymphoma  . TENOLYSIS Right 12/25/2014   Procedure: RIGHT 3,4,5 FLEXOR TENDON RELEASE ;  Surgeon: Wylene Simmer, MD;  Location: [facility];  Service: Orthopedics;  Laterality: Right;  . TOTAL KNEE ARTHROPLASTY Left 01/30/2017   Procedure: LEFT TOTAL KNEE ARTHROPLASTY;  Surgeon: Gaynelle Arabian, MD;  Location: [facility];  Service: Orthopedics;  Laterality: Left;   Social History   Occupational History  . Not on file  Tobacco Use  . Smoking status: Never Smoker  . Smokeless tobacco: Never Used  Substance and Sexual Activity  . Alcohol use: Yes    Alcohol/week: 1.0 standard drinks    Types: 1 Cans of beer per week    Comment: occasional   . Drug use: No    Comment: last use few years ago per patient - used marijuana   . Sexual activity: Yes    Partners: Female

## 2018-07-30 ENCOUNTER — Ambulatory Visit (INDEPENDENT_AMBULATORY_CARE_PROVIDER_SITE_OTHER): Payer: Medicare HMO | Admitting: Orthotics

## 2018-07-30 DIAGNOSIS — L97511 Non-pressure chronic ulcer of other part of right foot limited to breakdown of skin: Secondary | ICD-10-CM | POA: Diagnosis not present

## 2018-07-30 DIAGNOSIS — E1142 Type 2 diabetes mellitus with diabetic polyneuropathy: Secondary | ICD-10-CM

## 2018-07-30 DIAGNOSIS — Z89411 Acquired absence of right great toe: Secondary | ICD-10-CM

## 2018-07-30 DIAGNOSIS — S98131S Complete traumatic amputation of one right lesser toe, sequela: Secondary | ICD-10-CM

## 2018-07-30 DIAGNOSIS — E11621 Type 2 diabetes mellitus with foot ulcer: Secondary | ICD-10-CM

## 2018-07-30 DIAGNOSIS — M2041 Other hammer toe(s) (acquired), right foot: Secondary | ICD-10-CM

## 2018-07-30 DIAGNOSIS — Z89429 Acquired absence of other toe(s), unspecified side: Secondary | ICD-10-CM

## 2018-07-30 NOTE — Progress Notes (Signed)

## 2018-08-15 ENCOUNTER — Ambulatory Visit
Admission: RE | Admit: 2018-08-15 | Discharge: 2018-08-15 | Disposition: A | Discharge status: Home / Self Care | Payer: Medicare HMO | Source: Ambulatory Visit | Attending: Nephrology | Admitting: Nephrology

## 2018-08-15 DIAGNOSIS — D631 Anemia in chronic kidney disease: Secondary | ICD-10-CM

## 2018-08-15 DIAGNOSIS — N2581 Secondary hyperparathyroidism of renal origin: Secondary | ICD-10-CM

## 2018-08-15 DIAGNOSIS — Z9081 Acquired absence of spleen: Secondary | ICD-10-CM

## 2018-08-15 DIAGNOSIS — N183 Chronic kidney disease, stage 3 unspecified: Secondary | ICD-10-CM

## 2018-08-15 DIAGNOSIS — I129 Hypertensive chronic kidney disease with stage 1 through stage 4 chronic kidney disease, or unspecified chronic kidney disease: Secondary | ICD-10-CM

## 2018-08-15 DIAGNOSIS — G510 Bell's palsy: Secondary | ICD-10-CM

## 2018-08-15 DIAGNOSIS — N189 Chronic kidney disease, unspecified: Secondary | ICD-10-CM

## 2018-08-20 ENCOUNTER — Ambulatory Visit: Payer: Medicare HMO | Admitting: Family Medicine

## 2018-09-03 ENCOUNTER — Ambulatory Visit: Payer: Medicare HMO | Admitting: Orthotics

## 2018-09-03 DIAGNOSIS — S98131S Complete traumatic amputation of one right lesser toe, sequela: Secondary | ICD-10-CM

## 2018-09-03 DIAGNOSIS — L97511 Non-pressure chronic ulcer of other part of right foot limited to breakdown of skin: Principal | ICD-10-CM

## 2018-09-03 DIAGNOSIS — E11621 Type 2 diabetes mellitus with foot ulcer: Secondary | ICD-10-CM

## 2018-09-03 DIAGNOSIS — M2041 Other hammer toe(s) (acquired), right foot: Secondary | ICD-10-CM

## 2018-09-03 DIAGNOSIS — E1142 Type 2 diabetes mellitus with diabetic polyneuropathy: Secondary | ICD-10-CM

## 2018-09-03 NOTE — Progress Notes (Signed)
Adjusted f/o to take pressure off painful blisters forming in arch.Marland KitchenMarland Kitchen

## 2018-10-09 ENCOUNTER — Other Ambulatory Visit: Payer: Self-pay

## 2018-10-10 ENCOUNTER — Encounter: Payer: Self-pay | Admitting: Podiatry

## 2018-10-10 ENCOUNTER — Ambulatory Visit: Payer: Medicare HMO | Admitting: Podiatry

## 2018-10-10 DIAGNOSIS — L97511 Non-pressure chronic ulcer of other part of right foot limited to breakdown of skin: Secondary | ICD-10-CM | POA: Diagnosis not present

## 2018-10-10 DIAGNOSIS — M79674 Pain in right toe(s): Secondary | ICD-10-CM

## 2018-10-10 DIAGNOSIS — E11621 Type 2 diabetes mellitus with foot ulcer: Secondary | ICD-10-CM

## 2018-10-10 DIAGNOSIS — E1142 Type 2 diabetes mellitus with diabetic polyneuropathy: Secondary | ICD-10-CM

## 2018-10-10 DIAGNOSIS — M79675 Pain in left toe(s): Secondary | ICD-10-CM

## 2018-10-10 DIAGNOSIS — B351 Tinea unguium: Secondary | ICD-10-CM | POA: Diagnosis not present

## 2018-10-10 DIAGNOSIS — M48061 Spinal stenosis, lumbar region without neurogenic claudication: Secondary | ICD-10-CM | POA: Insufficient documentation

## 2018-10-10 NOTE — Patient Instructions (Addendum)
Corns and Calluses Corns are small areas of thickened skin that occur on the top, sides, or tip of a toe. They contain a cone-shaped core with a point that can press on a nerve below. This causes pain.  Calluses are areas of thickened skin that can occur anywhere on the body, including the hands, fingers, palms, soles of the feet, and heels. Calluses are usually larger than corns. What are the causes? Corns and calluses are caused by rubbing (friction) or pressure, such as from shoes that are too tight or do not fit properly. What increases the risk? Corns are more likely to develop in people who have misshapen toes (toe deformities), such as hammer toes. Calluses can occur with friction to any area of the skin. They are more likely to develop in people who:  Work with their hands.  Wear shoes that fit poorly, are too tight, or are high-heeled.  Have toe deformities. What are the signs or symptoms? Symptoms of a corn or callus include:  A hard growth on the skin.  Pain or tenderness under the skin.  Redness and swelling.  Increased discomfort while wearing tight-fitting shoes, if your feet are affected. If a corn or callus becomes infected, symptoms may include:  Redness and swelling that gets worse.  Pain.  Fluid, blood, or pus draining from the corn or callus. How is this diagnosed? Corns and calluses may be diagnosed based on your symptoms, your medical history, and a physical exam. How is this treated? Treatment for corns and calluses may include:  Removing the cause of the friction or pressure. This may involve: ? Changing your shoes. ? Wearing shoe inserts (orthotics) or other protective layers in your shoes, such as a corn pad. ? Wearing gloves.  Applying medicine to the skin (topical medicine) to help soften skin in the hardened, thickened areas.  Removing layers of dead skin with a file to reduce the size of the corn or callus.  Removing the corn or callus with a  scalpel or laser.  Taking antibiotic medicines, if your corn or callus is infected.  Having surgery, if a toe deformity is the cause. Follow these instructions at home:   Take over-the-counter and prescription medicines only as told by your health care provider.  If you were prescribed an antibiotic, take it as told by your health care provider. Do not stop taking it even if your condition starts to improve.  Wear shoes that fit well. Avoid wearing high-heeled shoes and shoes that are too tight or too loose.  Wear any padding, protective layers, gloves, or orthotics as told by your health care provider.  Soak your hands or feet and then use a file or pumice stone to soften your corn or callus. Do this as told by your health care provider.  Check your corn or callus every day for symptoms of infection. Contact a health care provider if you:  Notice that your symptoms do not improve with treatment.  Have redness or swelling that gets worse.  Notice that your corn or callus becomes painful.  Have fluid, blood, or pus coming from your corn or callus.  Have new symptoms. Summary  Corns are small areas of thickened skin that occur on the top, sides, or tip of a toe.  Calluses are areas of thickened skin that can occur anywhere on the body, including the hands, fingers, palms, and soles of the feet. Calluses are usually larger than corns.  Corns and calluses are caused by   rubbing (friction) or pressure, such as from shoes that are too tight or do not fit properly.  Treatment may include wearing any padding, protective layers, gloves, or orthotics as told by your health care provider. This information is not intended to replace advice given to you by your health care provider. Make sure you discuss any questions you have with your health care provider. Document Released: 04/30/2004 Document Revised: 06/07/2017 Document Reviewed: 06/07/2017 Elsevier Interactive Patient Education   2019 [employer]  Onychomycosis/Fungal Toenails  WHAT IS IT? An infection that lies within the keratin of your nail plate that is caused by a fungus.  WHY ME? Fungal infections affect all ages, sexes, races, and creeds.  There may be many factors that predispose you to a fungal infection such as age, coexisting medical conditions such as diabetes, or an autoimmune disease; stress, medications, fatigue, genetics, etc.  Bottom line: fungus thrives in a warm, moist environment and your shoes offer such a location.  IS IT CONTAGIOUS? Theoretically, yes.  You do not want to share shoes, nail clippers or files with someone who has fungal toenails.  Walking around barefoot in the same room or sleeping in the same bed is unlikely to transfer the organism.  It is important to realize, however, that fungus can spread easily from one nail to the next on the same foot.  HOW DO WE TREAT THIS?  There are several ways to treat this condition.  Treatment may depend on many factors such as age, medications, pregnancy, liver and kidney conditions, etc.  It is best to ask your doctor which options are available to you.  1. No treatment.   Unlike many other medical concerns, you can live with this condition.  However for many people this can be a painful condition and may lead to ingrown toenails or a bacterial infection.  It is recommended that you keep the nails cut short to help reduce the amount of fungal nail. 2. Topical treatment.  These range from herbal remedies to prescription strength nail lacquers.  About 40-50% effective, topicals require twice daily application for approximately 9 to 12 months or until an entirely new nail has grown out.  The most effective topicals are medical grade medications available through physicians offices. 3. Oral antifungal medications.  With an 80-90% cure rate, the most common oral medication requires 3 to 4 months of therapy and stays in your system for a year as the new nail  grows out.  Oral antifungal medications do require blood work to make sure it is a safe drug for you.  A liver function panel will be performed prior to starting the medication and after the first month of treatment.  It is important to have the blood work performed to avoid any harmful side effects.  In general, this medication safe but blood work is required. 4. Laser Therapy.  This treatment is performed by applying a specialized laser to the affected nail plate.  This therapy is noninvasive, fast, and non-painful.  It is not covered by insurance and is therefore, out of pocket.  The results have been very good with a 80-95% cure rate.  [facility] is the only practice in the area to offer this therapy. 5. Permanent Nail Avulsion.  Removing the entire nail so that a new nail will not grow back. Diabetes Mellitus and Foot Care Foot care is an important part of your health, especially when you have diabetes. Diabetes may cause you to have problems   because of poor blood flow (circulation) to your feet and legs, which can cause your skin to:  Become thinner and drier.  Break more easily.  Heal more slowly.  Peel and crack. You may also have nerve damage (neuropathy) in your legs and feet, causing decreased feeling in them. This means that you may not notice minor injuries to your feet that could lead to more serious problems. Noticing and addressing any potential problems early is the best way to prevent future foot problems. How to care for your feet Foot hygiene  Wash your feet daily with warm water and mild soap. Do not use hot water. Then, pat your feet and the areas between your toes until they are completely dry. Do not soak your feet as this can dry your skin.  Trim your toenails straight across. Do not dig under them or around the cuticle. File the edges of your nails with an emery board or nail file.  Apply a moisturizing lotion or petroleum jelly to the skin on your feet and  to dry, brittle toenails. Use lotion that does not contain alcohol and is unscented. Do not apply lotion between your toes. Shoes and socks  Wear clean socks or stockings every day. Make sure they are not too tight. Do not wear knee-high stockings since they may decrease blood flow to your legs.  Wear shoes that fit properly and have enough cushioning. Always look in your shoes before you put them on to be sure there are no objects inside.  To break in new shoes, wear them for just a few hours a day. This prevents injuries on your feet. Wounds, scrapes, corns, and calluses  Check your feet daily for blisters, cuts, bruises, sores, and redness. If you cannot see the bottom of your feet, use a mirror or ask someone for help.  Do not cut corns or calluses or try to remove them with medicine.  If you find a minor scrape, cut, or break in the skin on your feet, keep it and the skin around it clean and dry. You may clean these areas with mild soap and water. Do not clean the area with peroxide, alcohol, or iodine.  If you have a wound, scrape, corn, or callus on your foot, look at it several times a day to make sure it is healing and not infected. Check for: ? Redness, swelling, or pain. ? Fluid or blood. ? Warmth. ? Pus or a bad smell. General instructions  Do not cross your legs. This may decrease blood flow to your feet.  Do not use heating pads or hot water bottles on your feet. They may burn your skin. If you have lost feeling in your feet or legs, you may not know this is happening until it is too late.  Protect your feet from hot and cold by wearing shoes, such as at the beach or on hot pavement.  Schedule a complete foot exam at least once a year (annually) or more often if you have foot problems. If you have foot problems, report any cuts, sores, or bruises to your health care provider immediately. Contact a health care provider if:  You have a medical condition that increases your  risk of infection and you have any cuts, sores, or bruises on your feet.  You have an injury that is not healing.  You have redness on your legs or feet.  You feel burning or tingling in your legs or feet.  You have pain   or cramps in your legs and feet.  Your legs or feet are numb.  Your feet always feel cold.  You have pain around a toenail. Get help right away if:  You have a wound, scrape, corn, or callus on your foot and: ? You have pain, swelling, or redness that gets worse. ? You have fluid or blood coming from the wound, scrape, corn, or callus. ? Your wound, scrape, corn, or callus feels warm to the touch. ? You have pus or a bad smell coming from the wound, scrape, corn, or callus. ? You have a fever. ? You have a red line going up your leg. Summary  Check your feet every day for cuts, sores, red spots, swelling, and blisters.  Moisturize feet and legs daily.  Wear shoes that fit properly and have enough cushioning.  If you have foot problems, report any cuts, sores, or bruises to your health care provider immediately.  Schedule a complete foot exam at least once a year (annually) or more often if you have foot problems. This information is not intended to replace advice given to you by your health care provider. Make sure you discuss any questions you have with your health care provider. Document Released: 07/22/2000 Document Revised: 09/06/2017 Document Reviewed: 08/26/2016 Elsevier Interactive Patient Education  2019 Elsevier Inc.  

## 2018-10-10 NOTE — Progress Notes (Signed)
Subjective: Cory Marks presents for at-risk diabetic foot care. He presents with h/o diabetes, diabetic neuropathy, amputations right hallux right and second toe.  He currently has a pre-ulcerative lesion noted on the right fifth digit.  Objective: Vascular Examination: Capillary refill time immediate x 10 digits. . Dorsalis pedis and Posterior tibial pulses present b/l.  No digital hair x 10 digits.  Skin temperature within normal limits b/l.  Dermatological Examination: Skin with normal turgor, texture and tone b/l.  Toenails 3-5 right, 1-5 left  and digits  are discolored, thick, dystrophic with subungual debris and pain with palpation to nailbeds due to thickness of nails.  Hyperkeratotic lesion plantarlateral aspect of right fifth digit. Predebridement, lesion measures 4.0 x 3.5 cm extending from nailbed to MPJ.  There is a moderate amount of hyperkeratotic tissue with subdermal hemorrhage present.  There is no flocculence, no erythema, no edema noted. Postdebridement, ulcer measures 4.0 x 3.5 x planar depth.  Hyperkeratotic lesion distal tip left hallux, submetatarsal head 5 left foot. No erythema, no edema, no drainage, no flocculence.  Well healed surgical scars from right great toe and right 2nd digit amputations.  Musculoskeletal: Muscle strength 5/5 to all LE muscle groups  Amputation noted right great toe and right 2nd digit.  Neurological: Sensation diminished with 10 gram monofilament.  Vibratory sensation diminished.  Assessment: 1. Painful onychomycosis toenails 3-5 right, 1-5 left  2. Partial thickness ulceration noted right fifth digit 3. NIDDM with Diabetic neuropathy 4.   Amputation status right great toe, right 2nd toe  Plan: 1. Continue diabetic foot care principles. Literature dispensed. 2. Toenails 3-5 right, 1-5 left were debrided in length and girth without iatrogenic bleeding. 3.  Partial thickness ulcer pared right 5th digit with sterile  scalpel blade without incident.  Gently smoothed with burr.  Silicone digital toe dispensed today he is to apply every morning and remove every evening. He was counseled on importance of wearing the  [name] related understanding. 4. Calluses submetatarsal head 5 left foot, distal left hallux pared with sterile scalpel blade without incident. 5. Patient to continue soft, supportive shoe gear. 6. Patient to report any pedal injuries to medical professional. 7. Follow up 3 months.  8. Patient/POA to call should there be a concern in the interim.

## 2018-11-08 ENCOUNTER — Encounter (HOSPITAL_COMMUNITY): Admission: RE | Payer: Self-pay | Source: Home / Self Care

## 2018-11-08 ENCOUNTER — Inpatient Hospital Stay (HOSPITAL_COMMUNITY): Admission: RE | Admit: 2018-11-08 | Payer: Medicare HMO | Source: Home / Self Care | Admitting: Orthopedic Surgery

## 2018-11-08 SURGERY — ARTHROPLASTY, SHOULDER, TOTAL, REVERSE
Anesthesia: General | Laterality: Right

## 2018-12-19 ENCOUNTER — Ambulatory Visit: Payer: Medicare HMO | Admitting: Podiatry

## 2019-01-25 ENCOUNTER — Encounter (HOSPITAL_COMMUNITY): Payer: Self-pay

## 2019-01-25 NOTE — Patient Instructions (Addendum)
YOU ARE REQUIRED TO BE TESTED FOR COVID-19 PRIOR TO YOUR SURGERY . YOUR TEST MUST BE COMPLETED TODAY June 22nd  TESTING IS LOCATED AT [place] ENTRANCE FROM 9:00AM - 3:00PM. FAILURE TO COMPLETE TESTING MAY RESULT IN CANCELLATION OF YOUR SURGERY.  ONCE YOUR COVID TEST IS COMPLETED, PLEASE BEGIN THE QUARANTINE INSTRUCTIONS AS OUTLINED IN YOUR HANDOUT.                [name]    Your procedure is scheduled on: 01-31-2019   Report to [facility] Main  Entrance    Report to SHORT STAY at 530  AM      Call this number if you have problems the morning of surgery [phone]    Remember: [place], NO CHEWING GUM [place].   Do not eat food After Midnight. YOU MAY HAVE CLEAR LIQUIDS FROM MIDNIGHT UNTIL 4:30AM. At 4:30AM Please finish the prescribed Pre-Surgery Gatorade drink. Nothing by mouth after you finish the Gatorade drink !     CLEAR LIQUID DIET   Foods Allowed                                                                     Foods Excluded  Coffee and tea, regular and decaf                             liquids that you cannot  Plain Jell-O in any flavor                                             see through such as: Fruit ices (not with fruit pulp)                                     milk, soups, orange juice  Iced Popsicles                                    All solid food Carbonated beverages, regular and diet                                    Cranberry, grape and apple juices Sports drinks like Gatorade Lightly seasoned clear broth or consume(fat free) Sugar, honey syrup  Sample Menu Breakfast                                Lunch                                     Supper Cranberry juice  Beef broth                            Chicken broth Jell-O                                     Grape juice                           Apple juice Coffee or tea                         Jell-O                                      Popsicle                                                Coffee or tea                        Coffee or tea  _____________________________________________________________________   Take these medicines the morning of surgery with A SIP OF WATER: GABAPENTIN (NEURONTIN), ATORVASTATIN (LIPITOR), AMLODIPINE (NORVASC)                               You may not have any metal on your body including hair pins and              piercings  Do not wear jewelry, make-up, lotions, powders or perfumes, deodorant                         Men may shave face and neck.   Do not bring valuables to the hospital. [place].  Contacts, dentures or bridgework may not be worn into surgery.  Leave suitcase in the car. After surgery it may be brought to your room.      _____________________________________________________________________             [facility] - Preparing for Surgery Before surgery, you can play an important role.  Because skin is not sterile, your skin needs to be as free of germs as possible.  You can reduce the number of germs on your skin by washing with CHG (chlorahexidine gluconate) soap before surgery.  CHG is an antiseptic cleaner which kills germs and bonds with the skin to continue killing germs even after washing. Please DO NOT use if you have an allergy to CHG or antibacterial soaps.  If your skin becomes reddened/irritated stop using the CHG and inform your nurse when you arrive at Short Stay. Do not shave (including legs and underarms) for at least 48 hours prior to the first CHG shower.  You may shave your face/neck. Please follow these instructions carefully:  1.  Shower with CHG Soap the night before surgery and the  morning of Surgery.  2.  If you choose to wash your hair, wash your hair first  as usual with your  normal  shampoo.  3.  After you shampoo, rinse your hair  and body thoroughly to remove the  shampoo.                           4.  Use CHG as you would any other liquid soap.  You can apply chg directly  to the skin and wash                       Gently with a scrungie or clean washcloth.  5.  Apply the CHG Soap to your body ONLY FROM THE NECK DOWN.   Do not use on face/ open                           Wound or open sores. Avoid contact with eyes, ears mouth and genitals (private parts).                       Wash face,  Genitals (private parts) with your normal soap.             6.  Wash thoroughly, paying special attention to the area where your surgery  will be performed.  7.  Thoroughly rinse your body with warm water from the neck down.  8.  DO NOT shower/wash with your normal soap after using and rinsing off  the CHG Soap.                9.  Pat yourself dry with a clean towel.            10.  Wear clean pajamas.            11.  Place clean sheets on your bed the night of your first shower and do not  sleep with pets. Day of Surgery : Do not apply any lotions/deodorants the morning of surgery.  Please wear clean clothes to the hospital/surgery center.  FAILURE TO FOLLOW THESE INSTRUCTIONS MAY RESULT IN THE CANCELLATION OF YOUR SURGERY PATIENT SIGNATURE_________________________________  NURSE SIGNATURE__________________________________  ________________________________________________________________________   Adam Phenix  An incentive spirometer is a tool that can help keep your lungs clear and active. This tool measures how well you are filling your lungs with each breath. Taking long deep breaths may help reverse or decrease the chance of developing breathing (pulmonary) problems (especially infection) following:  A long period of time when you are unable to move or be active. BEFORE THE PROCEDURE   If the spirometer includes an indicator to show your best effort, your nurse or respiratory therapist will set it to a desired  goal.  If possible, sit up straight or lean slightly forward. Try not to slouch.  Hold the incentive spirometer in an upright position. INSTRUCTIONS FOR USE  1. Sit on the edge of your bed if possible, or sit up as far as you can in bed or on a chair. 2. Hold the incentive spirometer in an upright position. 3. Breathe out normally. 4. Place the mouthpiece in your mouth and seal your lips tightly around it. 5. Breathe in slowly and as deeply as possible, raising the piston or the ball toward the top of the column. 6. Hold your breath for 3-5 seconds or for as long as possible. Allow the piston or ball to fall to the bottom of the column. 7. Remove the  mouthpiece from your mouth and breathe out normally. 8. Rest for a few seconds and repeat Steps 1 through 7 at least 10 times every 1-2 hours when you are awake. Take your time and take a few normal breaths between deep breaths. 9. The spirometer may include an indicator to show your best effort. Use the indicator as a goal to work toward during each repetition. 10. After each set of 10 deep breaths, practice coughing to be sure your lungs are clear. If you have an incision (the cut made at the time of surgery), support your incision when coughing by placing a pillow or rolled up towels firmly against it. Once you are able to get out of bed, walk around indoors and cough well. You may stop using the incentive spirometer when instructed by your caregiver.  RISKS AND COMPLICATIONS  Take your time so you do not get dizzy or light-headed.  If you are in pain, you may need to take or ask for pain medication before doing incentive spirometry. It is harder to take a deep breath if you are having pain. AFTER USE  Rest and breathe slowly and easily.  It can be helpful to keep track of a log of your progress. Your caregiver can provide you with a simple table to help with this. If you are using the spirometer at home, follow these instructions: [place] IF:   You are having difficultly using the spirometer.  You have trouble using the spirometer as often as instructed.  Your pain medication is not giving enough relief while using the spirometer.  You develop fever of 100.5 F (38.1 C) or higher. SEEK IMMEDIATE MEDICAL CARE IF:   You cough up bloody sputum that had not been present before.  You develop fever of 102 F (38.9 C) or greater.  You develop worsening pain at or near the incision site. MAKE SURE YOU:   Understand these instructions.  Will watch your condition.  Will get help right away if you are not doing well or get worse. Document Released: 12/05/2006 Document Revised: 10/17/2011 Document Reviewed: 02/05/2007 [facility] Patient Information 2014 [place], [place].   ________________________________________________________________________

## 2019-01-28 ENCOUNTER — Other Ambulatory Visit (HOSPITAL_COMMUNITY)
Admission: RE | Admit: 2019-01-28 | Discharge: 2019-01-28 | Disposition: A | Discharge status: Home / Self Care | Payer: Medicare HMO | Source: Ambulatory Visit | Attending: Orthopedic Surgery | Admitting: Orthopedic Surgery

## 2019-01-28 ENCOUNTER — Ambulatory Visit: Payer: Medicare HMO | Admitting: Podiatry

## 2019-01-28 ENCOUNTER — Other Ambulatory Visit: Payer: Self-pay

## 2019-01-28 ENCOUNTER — Encounter (HOSPITAL_COMMUNITY)
Admission: RE | Admit: 2019-01-28 | Discharge: 2019-01-28 | Disposition: A | Discharge status: Home / Self Care | Payer: Medicare HMO | Source: Ambulatory Visit | Attending: Orthopedic Surgery | Admitting: Orthopedic Surgery

## 2019-01-28 ENCOUNTER — Encounter (HOSPITAL_COMMUNITY): Payer: Self-pay

## 2019-01-28 DIAGNOSIS — I4892 Unspecified atrial flutter: Secondary | ICD-10-CM | POA: Insufficient documentation

## 2019-01-28 DIAGNOSIS — E785 Hyperlipidemia, unspecified: Secondary | ICD-10-CM | POA: Insufficient documentation

## 2019-01-28 DIAGNOSIS — I2510 Atherosclerotic heart disease of native coronary artery without angina pectoris: Secondary | ICD-10-CM | POA: Insufficient documentation

## 2019-01-28 DIAGNOSIS — E1122 Type 2 diabetes mellitus with diabetic chronic kidney disease: Secondary | ICD-10-CM | POA: Insufficient documentation

## 2019-01-28 DIAGNOSIS — N183 Chronic kidney disease, stage 3 (moderate): Secondary | ICD-10-CM | POA: Insufficient documentation

## 2019-01-28 DIAGNOSIS — M75101 Unspecified rotator cuff tear or rupture of right shoulder, not specified as traumatic: Secondary | ICD-10-CM | POA: Insufficient documentation

## 2019-01-28 DIAGNOSIS — R001 Bradycardia, unspecified: Secondary | ICD-10-CM | POA: Insufficient documentation

## 2019-01-28 DIAGNOSIS — E1151 Type 2 diabetes mellitus with diabetic peripheral angiopathy without gangrene: Secondary | ICD-10-CM | POA: Insufficient documentation

## 2019-01-28 DIAGNOSIS — I129 Hypertensive chronic kidney disease with stage 1 through stage 4 chronic kidney disease, or unspecified chronic kidney disease: Secondary | ICD-10-CM | POA: Insufficient documentation

## 2019-01-28 DIAGNOSIS — I4891 Unspecified atrial fibrillation: Secondary | ICD-10-CM | POA: Insufficient documentation

## 2019-01-28 DIAGNOSIS — M19011 Primary osteoarthritis, right shoulder: Secondary | ICD-10-CM | POA: Insufficient documentation

## 2019-01-28 DIAGNOSIS — Z01818 Encounter for other preprocedural examination: Secondary | ICD-10-CM | POA: Insufficient documentation

## 2019-01-28 DIAGNOSIS — Z79899 Other long term (current) drug therapy: Secondary | ICD-10-CM | POA: Insufficient documentation

## 2019-01-28 HISTORY — DX: Unspecified injury of unspecified eye and orbit, initial encounter: S05.90XA

## 2019-01-28 LAB — BASIC METABOLIC PANEL
Anion gap: 11 (ref 5–15)
BUN: 31 mg/dL — ABNORMAL HIGH (ref 6–20)
CO2: 23 mmol/L (ref 22–32)
Calcium: 9.2 mg/dL (ref 8.9–10.3)
Chloride: 106 mmol/L (ref 98–111)
Creatinine, Ser: 1.87 mg/dL — ABNORMAL HIGH (ref 0.61–1.24)
GFR calc Af Amer: 45 mL/min — ABNORMAL LOW (ref >60)
GFR calc non Af Amer: 39 mL/min — ABNORMAL LOW (ref >60)
Glucose, Bld: 115 mg/dL — ABNORMAL HIGH (ref 70–99)
Potassium: 3.9 mmol/L (ref 3.5–5.1)
Sodium: 140 mmol/L (ref 135–145)

## 2019-01-28 LAB — SARS CORONAVIRUS 2 (TAT 6-24 HRS): SARS Coronavirus 2: NEGATIVE

## 2019-01-28 LAB — CBC
HCT: 43 % (ref 39.0–52.0)
Hemoglobin: 14.1 g/dL (ref 13.0–17.0)
MCH: 33.7 pg (ref 26.0–34.0)
MCHC: 32.8 g/dL (ref 30.0–36.0)
MCV: 102.6 fL — ABNORMAL HIGH (ref 80.0–100.0)
Platelets: 172 K/uL (ref 150–400)
RBC: 4.19 MIL/uL — ABNORMAL LOW (ref 4.22–5.81)
RDW: 13.5 % (ref 11.5–15.5)
WBC: 4.6 K/uL (ref 4.0–10.5)
nRBC: 0 % (ref 0.0–0.2)

## 2019-01-28 LAB — SURGICAL PCR SCREEN
MRSA, PCR: POSITIVE — AB
Staphylococcus aureus: POSITIVE — AB

## 2019-01-29 LAB — HEMOGLOBIN A1C
Hgb A1c MFr Bld: 5.9 % — ABNORMAL HIGH (ref 4.8–5.6)
Mean Plasma Glucose: 123 mg/dL

## 2019-01-29 NOTE — Progress Notes (Signed)
Anesthesia Chart Review   Case: [id number] Date/Time: 01/31/19 0715   Procedure: REVERSE SHOULDER ARTHROPLASTY (Right )   Anesthesia type: General   Pre-op diagnosis: right shoulder glenohumeral osteoarthritis, rotator cuff tear arthropathy   Location: [facility]OM 06 / [facility]   Surgeon: Justice Britain, MD      DISCUSSION:58 y.o. never smoker with h/o HTN, A-fib/A-flutter without recurrence s/p ablation 2018, CAD, hyperlipemia, CKD Stage III, DM II, PVD, polysubstance abuse, right shoulder OA, right shoulder rotator cuff tear scheduled for above procedure 01/31/2019 with Dr. Justice Britain.   Pt can proceed with planned procedure barring acute status change.  VS: BP 118/76 Comment: rue  Pulse (!) 46   Temp 36.8 C (Oral)   Resp 16   Ht 6' (1.829 m)   Wt 98.9 kg   SpO2 100%   BMI 29.57 kg/m   PROVIDERS: [name], MD is PCP    LABS: Labs reviewed: Acceptable for surgery. (all labs ordered are listed, but only abnormal results are displayed)  Labs Reviewed  SURGICAL PCR SCREEN - Abnormal; Notable for the following components:      Result Value   MRSA, PCR POSITIVE (*)    Staphylococcus aureus POSITIVE (*)    All other components within normal limits  HEMOGLOBIN A1C - Abnormal; Notable for the following components:   Hgb A1c MFr Bld 5.9 (*)    All other components within normal limits  BASIC METABOLIC PANEL - Abnormal; Notable for the following components:   Glucose, Bld 115 (*)    BUN 31 (*)    Creatinine, Ser 1.87 (*)    GFR calc non Af Amer 39 (*)    GFR calc Af Amer 45 (*)    All other components within normal limits  CBC - Abnormal; Notable for the following components:   RBC 4.19 (*)    MCV 102.6 (*)    All other components within normal limits     IMAGES:   EKG: 01/28/2019 Rate 50 bpm Sinus bradycardia  New since previous tracing  Otherwise normal ECG  CV:  Past Medical History:  Diagnosis Date  . Arthritis   . Bell's palsy   . CAD (coronary artery  disease)   . Cancer [facility]) 2012   Mantle Cell Cancer- S/P stem cell transplant  . CKD (chronic kidney disease) stage 3, GFR 30-59 ml/min (HCC)    per lov with pcp,  last seen at [facility] january 2020 per patient, stable per patient   . Diabetes mellitus without complication (HCC)    hx of lost weight due to cancer not on medications since 2014   . Dysrhythmia    atrial fib and atrial flutter, no repeat of dysrhymia since ablation in 2018 ,   . Eye injury    left eye hit by a wood chip early june 2020, lov for f/u with opthamologist 6-17 , drained a pustule,  progress notes mention eye gradually improving with application of antibiotic  eye drops   . History of chemotherapy last tx 2014  . History of diabetes mellitus NONE SINCE 2012 CANCER  . History of splenectomy   . Hyperlipemia   . Hypertension   . Neuromuscular disorder ([place])    peripheral neuropathy  . Neuropathy    right leg due to football injury  . Polysubstance abuse ([place])   . Sinus bradycardia     Past Surgical History:  Procedure Laterality Date  . A-FLUTTER ABLATION N/A 09/12/2016   Procedure: A-Flutter  Ablation;  Surgeon: Will Meredith Leeds, MD;  Location: [place] [facility];  Service: Cardiovascular;  Laterality: N/A;  . AMPUTATION Right 10/20/2014   Procedure: AMPUTATION RIGHT GREAT TOE;  Surgeon: Gaynelle Arabian, MD;  Location: [facility];  Service: Orthopedics;  Laterality: Right;  . AMPUTATION Right 12/25/2014   Procedure: RIGHT 2ND TOE AMPUTATION ;  Surgeon: Wylene Simmer, MD;  Location: [place];  Service: Orthopedics;  Laterality: Right;  . edentulation N/A y-2   prior to transplant  . EYE SURGERY    . right knee surgery Right    nerve graft back of leg  . SPLENECTOMY, TOTAL     mantle cell lymphoma  . TENOLYSIS Right 12/25/2014   Procedure: RIGHT 3,4,5 FLEXOR TENDON RELEASE ;  Surgeon: Wylene Simmer, MD;  Location: [place];  Service: Orthopedics;  Laterality: Right;  . TOTAL  KNEE ARTHROPLASTY Left 01/30/2017   Procedure: LEFT TOTAL KNEE ARTHROPLASTY;  Surgeon: Gaynelle Arabian, MD;  [facility];  Service: Orthopedics;  Laterality: Left;    MEDICATIONS: . amLODipine (NORVASC) 10 MG tablet  . atorvastatin (LIPITOR) 40 MG tablet  . Cholecalciferol (VITAMIN D) 50 MCG (2000 UT) tablet  . diclofenac sodium (VOLTAREN) 1 % GEL  . diphenhydrAMINE (BENADRYL) 25 mg capsule  . gabapentin (NEURONTIN) 600 MG tablet  . lisinopril (PRINIVIL,ZESTRIL) 20 MG tablet  . morphine (MSIR) 15 MG tablet  . penicillin v potassium (VEETID) 500 MG tablet  . pyridOXINE (VITAMIN B-6) 100 MG tablet  . sildenafil (VIAGRA) 25 MG tablet   No current facility-administered medications for this encounter.    Maia Plan [facility] Pre-Surgical Testing [phone] 01/29/19  2:55 PM

## 2019-01-30 ENCOUNTER — Encounter: Payer: Self-pay | Admitting: Podiatry

## 2019-01-30 ENCOUNTER — Other Ambulatory Visit: Payer: Self-pay

## 2019-01-30 ENCOUNTER — Ambulatory Visit (INDEPENDENT_AMBULATORY_CARE_PROVIDER_SITE_OTHER): Payer: Medicare HMO | Admitting: Podiatry

## 2019-01-30 VITALS — Temp 98.0°F

## 2019-01-30 DIAGNOSIS — M79674 Pain in right toe(s): Secondary | ICD-10-CM

## 2019-01-30 DIAGNOSIS — Z89429 Acquired absence of other toe(s), unspecified side: Secondary | ICD-10-CM

## 2019-01-30 DIAGNOSIS — B351 Tinea unguium: Secondary | ICD-10-CM | POA: Diagnosis not present

## 2019-01-30 DIAGNOSIS — E1142 Type 2 diabetes mellitus with diabetic polyneuropathy: Secondary | ICD-10-CM | POA: Diagnosis not present

## 2019-01-30 DIAGNOSIS — M79675 Pain in left toe(s): Secondary | ICD-10-CM

## 2019-01-30 DIAGNOSIS — L84 Corns and callosities: Secondary | ICD-10-CM | POA: Diagnosis not present

## 2019-01-30 DIAGNOSIS — S90819A Abrasion, unspecified foot, initial encounter: Secondary | ICD-10-CM

## 2019-01-30 NOTE — Anesthesia Preprocedure Evaluation (Addendum)
Anesthesia Evaluation  Patient identified by MRN, date of birth, ID band Patient awake    Reviewed: Allergy & Precautions, H&P , NPO status , Patient's Chart, lab work & pertinent test results  Airway Mallampati: II  TM Distance: >3 FB Neck ROM: Full    Dental  (+) Dental Advisory Given, Edentulous Upper, Missing   Pulmonary neg pulmonary ROS,    Pulmonary exam normal        Cardiovascular hypertension, Pt. on medications + CAD and +CHF  Normal cardiovascular exam+ dysrhythmias Atrial Fibrillation   -------------------------------------------------------------------  Study Conclusions 2016  - Left ventricle: The cavity size was normal. There was mild  concentric hypertrophy. Systolic function was mildly to  moderately reduced. The estimated ejection fraction was in the  range of 40% to 45%. Mild diffuse hypokinesis.  - Aortic valve: There was mild regurgitation.  - Mitral valve: There was mild regurgitation.  - Left atrium: The atrium was mildly dilated.  - Right ventricle: The cavity size was dilated. Wall thickness was  normal.  - Right atrium: The atrium was mildly dilated.    Neuro/Psych negative neurological ROS  negative psych ROS   GI/Hepatic negative GI ROS, (+)     substance abuse  ,   Endo/Other  diabetes, Type 2, Oral Hypoglycemic Agents  Renal/GU Renal InsufficiencyRenal disease  negative genitourinary   Musculoskeletal  (+) Arthritis , Osteoarthritis,    Abdominal   Peds  Hematology negative hematology ROS (+)   Anesthesia Other Findings   Reproductive/Obstetrics negative OB ROS                            Anesthesia Physical  Anesthesia Plan  ASA: III  Anesthesia Plan: General   Post-op Pain Management:  Regional for Post-op pain   Induction: Intravenous  PONV Risk Score and Plan: 2 and Ondansetron and Dexamethasone  Airway Management Planned: Oral  ETT  Additional Equipment:   Intra-op Plan:   Post-operative Plan: Extubation in OR  Informed Consent: I have reviewed the patients History and Physical, chart, labs and discussed the procedure including the risks, benefits and alternatives for the proposed anesthesia with the patient or authorized representative who has indicated his/her understanding and acceptance.     Dental advisory given  Plan Discussed with: CRNA and Anesthesiologist  Anesthesia Plan Comments:        Anesthesia Quick Evaluation

## 2019-01-30 NOTE — Patient Instructions (Signed)

## 2019-01-31 ENCOUNTER — Inpatient Hospital Stay (HOSPITAL_COMMUNITY): Payer: Medicare HMO | Admitting: Physician Assistant

## 2019-01-31 ENCOUNTER — Encounter (HOSPITAL_COMMUNITY): Payer: Self-pay

## 2019-01-31 ENCOUNTER — Inpatient Hospital Stay (HOSPITAL_COMMUNITY): Payer: Medicare HMO | Admitting: Certified Registered Nurse Anesthetist

## 2019-01-31 ENCOUNTER — Other Ambulatory Visit: Payer: Self-pay

## 2019-01-31 ENCOUNTER — Inpatient Hospital Stay (HOSPITAL_COMMUNITY)
Admission: RE | Admit: 2019-01-31 | Discharge: 2019-02-01 | DRG: 483 | Disposition: A | Discharge status: Home / Self Care | Payer: Medicare HMO | Attending: Orthopedic Surgery | Admitting: Orthopedic Surgery

## 2019-01-31 ENCOUNTER — Encounter (HOSPITAL_COMMUNITY)
Admission: RE | Disposition: A | Discharge status: Home / Self Care | Payer: Self-pay | Source: Home / Self Care | Attending: Orthopedic Surgery

## 2019-01-31 DIAGNOSIS — Z791 Long term (current) use of non-steroidal anti-inflammatories (NSAID): Secondary | ICD-10-CM

## 2019-01-31 DIAGNOSIS — Z89411 Acquired absence of right great toe: Secondary | ICD-10-CM | POA: Diagnosis not present

## 2019-01-31 DIAGNOSIS — Z79899 Other long term (current) drug therapy: Secondary | ICD-10-CM

## 2019-01-31 DIAGNOSIS — Z89421 Acquired absence of other right toe(s): Secondary | ICD-10-CM | POA: Diagnosis not present

## 2019-01-31 DIAGNOSIS — Z9081 Acquired absence of spleen: Secondary | ICD-10-CM | POA: Diagnosis not present

## 2019-01-31 DIAGNOSIS — Z885 Allergy status to narcotic agent status: Secondary | ICD-10-CM | POA: Diagnosis not present

## 2019-01-31 DIAGNOSIS — E785 Hyperlipidemia, unspecified: Secondary | ICD-10-CM | POA: Diagnosis present

## 2019-01-31 DIAGNOSIS — Z96652 Presence of left artificial knee joint: Secondary | ICD-10-CM | POA: Diagnosis present

## 2019-01-31 DIAGNOSIS — M75121 Complete rotator cuff tear or rupture of right shoulder, not specified as traumatic: Secondary | ICD-10-CM | POA: Diagnosis present

## 2019-01-31 DIAGNOSIS — Z9221 Personal history of antineoplastic chemotherapy: Secondary | ICD-10-CM

## 2019-01-31 DIAGNOSIS — N183 Chronic kidney disease, stage 3 (moderate): Secondary | ICD-10-CM | POA: Diagnosis present

## 2019-01-31 DIAGNOSIS — I129 Hypertensive chronic kidney disease with stage 1 through stage 4 chronic kidney disease, or unspecified chronic kidney disease: Secondary | ICD-10-CM | POA: Diagnosis present

## 2019-01-31 DIAGNOSIS — Z1159 Encounter for screening for other viral diseases: Secondary | ICD-10-CM | POA: Diagnosis not present

## 2019-01-31 DIAGNOSIS — Z22322 Carrier or suspected carrier of Methicillin resistant Staphylococcus aureus: Secondary | ICD-10-CM

## 2019-01-31 DIAGNOSIS — M19011 Primary osteoarthritis, right shoulder: Principal | ICD-10-CM | POA: Diagnosis present

## 2019-01-31 DIAGNOSIS — Z79891 Long term (current) use of opiate analgesic: Secondary | ICD-10-CM | POA: Diagnosis not present

## 2019-01-31 DIAGNOSIS — Z9484 Stem cells transplant status: Secondary | ICD-10-CM | POA: Diagnosis not present

## 2019-01-31 DIAGNOSIS — Z8572 Personal history of non-Hodgkin lymphomas: Secondary | ICD-10-CM

## 2019-01-31 DIAGNOSIS — I2510 Atherosclerotic heart disease of native coronary artery without angina pectoris: Secondary | ICD-10-CM | POA: Diagnosis present

## 2019-01-31 DIAGNOSIS — M25711 Osteophyte, right shoulder: Secondary | ICD-10-CM | POA: Diagnosis present

## 2019-01-31 DIAGNOSIS — Z96611 Presence of right artificial shoulder joint: Secondary | ICD-10-CM

## 2019-01-31 DIAGNOSIS — G629 Polyneuropathy, unspecified: Secondary | ICD-10-CM | POA: Diagnosis present

## 2019-01-31 HISTORY — PX: REVERSE SHOULDER ARTHROPLASTY: SHX5054

## 2019-01-31 LAB — GLUCOSE, CAPILLARY: Glucose-Capillary: 103 mg/dL — ABNORMAL HIGH (ref 70–99)

## 2019-01-31 SURGERY — ARTHROPLASTY, SHOULDER, TOTAL, REVERSE
Anesthesia: General | Site: Shoulder | Laterality: Right

## 2019-01-31 MED ORDER — SUCCINYLCHOLINE CHLORIDE 200 MG/10ML IV SOSY
PREFILLED_SYRINGE | INTRAVENOUS | Status: DC | PRN
  Administered 2019-01-31: 120 mg via INTRAVENOUS

## 2019-01-31 MED ORDER — VANCOMYCIN HCL IN DEXTROSE 1-5 GM/200ML-% IV SOLN
1000.0000 mg | INTRAVENOUS | Status: AC
  Administered 2019-01-31: 1000 mg via INTRAVENOUS
  Filled 2019-01-31: qty 200

## 2019-01-31 MED ORDER — DIPHENHYDRAMINE HCL 12.5 MG/5ML PO ELIX: 12.5000 mg | ORAL_SOLUTION | ORAL | Status: DC | PRN

## 2019-01-31 MED ORDER — ACETAMINOPHEN 325 MG PO TABS: 325.0000 mg | ORAL_TABLET | Freq: Four times a day (QID) | ORAL | Status: DC | PRN

## 2019-01-31 MED ORDER — FENTANYL CITRATE (PF) 100 MCG/2ML IJ SOLN
INTRAMUSCULAR | Status: AC
  Filled 2019-01-31: qty 2

## 2019-01-31 MED ORDER — CHLORHEXIDINE GLUCONATE 4 % EX LIQD: 60.0000 mL | Freq: Once | CUTANEOUS | Status: DC

## 2019-01-31 MED ORDER — CEFAZOLIN SODIUM-DEXTROSE 2-4 GM/100ML-% IV SOLN
2.0000 g | INTRAVENOUS | Status: AC
  Administered 2019-01-31: 2 g via INTRAVENOUS
  Filled 2019-01-31: qty 100

## 2019-01-31 MED ORDER — MAGNESIUM CITRATE PO SOLN: 1.0000 | Freq: Once | ORAL | Status: DC | PRN

## 2019-01-31 MED ORDER — SUCCINYLCHOLINE CHLORIDE 200 MG/10ML IV SOSY
PREFILLED_SYRINGE | INTRAVENOUS | Status: AC
  Filled 2019-01-31: qty 10

## 2019-01-31 MED ORDER — MENTHOL 3 MG MT LOZG
1.0000 | LOZENGE | OROMUCOSAL | Status: DC | PRN
  Filled 2019-01-31: qty 9

## 2019-01-31 MED ORDER — ALUM & MAG HYDROXIDE-SIMETH 200-200-20 MG/5ML PO SUSP: 30.0000 mL | ORAL | Status: DC | PRN

## 2019-01-31 MED ORDER — PROPOFOL 10 MG/ML IV BOLUS
INTRAVENOUS | Status: AC
  Filled 2019-01-31: qty 20

## 2019-01-31 MED ORDER — DOCUSATE SODIUM 100 MG PO CAPS
100.0000 mg | ORAL_CAPSULE | Freq: Two times a day (BID) | ORAL | Status: DC
  Administered 2019-01-31 – 2019-02-01 (×2): 100 mg via ORAL
  Filled 2019-01-31 (×2): qty 1

## 2019-01-31 MED ORDER — GABAPENTIN 300 MG PO CAPS
600.0000 mg | ORAL_CAPSULE | Freq: Three times a day (TID) | ORAL | Status: DC
  Administered 2019-01-31 – 2019-02-01 (×3): 600 mg via ORAL
  Filled 2019-01-31 (×3): qty 2

## 2019-01-31 MED ORDER — FENTANYL CITRATE (PF) 250 MCG/5ML IJ SOLN
INTRAMUSCULAR | Status: DC | PRN
  Administered 2019-01-31 (×3): 50 ug via INTRAVENOUS

## 2019-01-31 MED ORDER — ONDANSETRON HCL 4 MG/2ML IJ SOLN
INTRAMUSCULAR | Status: AC
  Filled 2019-01-31: qty 2

## 2019-01-31 MED ORDER — DEXAMETHASONE SODIUM PHOSPHATE 10 MG/ML IJ SOLN
INTRAMUSCULAR | Status: AC
  Filled 2019-01-31: qty 1

## 2019-01-31 MED ORDER — PHENOL 1.4 % MT LIQD
1.0000 | OROMUCOSAL | Status: DC | PRN
  Filled 2019-01-31: qty 177

## 2019-01-31 MED ORDER — METOCLOPRAMIDE HCL 5 MG PO TABS: 5.0000 mg | ORAL_TABLET | Freq: Three times a day (TID) | ORAL | Status: DC | PRN

## 2019-01-31 MED ORDER — EPHEDRINE 5 MG/ML INJ
INTRAVENOUS | Status: AC
  Filled 2019-01-31: qty 10

## 2019-01-31 MED ORDER — HYDROMORPHONE HCL 1 MG/ML IJ SOLN
0.5000 mg | INTRAMUSCULAR | Status: DC | PRN
  Administered 2019-02-01: 0.5 mg via INTRAVENOUS
  Filled 2019-01-31: qty 1

## 2019-01-31 MED ORDER — PROPOFOL 10 MG/ML IV BOLUS
INTRAVENOUS | Status: DC | PRN
  Administered 2019-01-31: 150 mg via INTRAVENOUS

## 2019-01-31 MED ORDER — ONDANSETRON HCL 4 MG PO TABS: 4.0000 mg | ORAL_TABLET | Freq: Four times a day (QID) | ORAL | Status: DC | PRN

## 2019-01-31 MED ORDER — STERILE WATER FOR IRRIGATION IR SOLN
Status: DC | PRN
  Administered 2019-01-31: 2000 mL

## 2019-01-31 MED ORDER — GLYCOPYRROLATE PF 0.2 MG/ML IJ SOSY
PREFILLED_SYRINGE | INTRAMUSCULAR | Status: AC
  Filled 2019-01-31: qty 1

## 2019-01-31 MED ORDER — MORPHINE SULFATE 15 MG PO TABS: 15.0000 mg | ORAL_TABLET | Freq: Two times a day (BID) | ORAL | Status: DC | PRN

## 2019-01-31 MED ORDER — POLYETHYLENE GLYCOL 3350 17 G PO PACK: 17.0000 g | PACK | Freq: Every day | ORAL | Status: DC | PRN

## 2019-01-31 MED ORDER — TEMAZEPAM 15 MG PO CAPS: 15.0000 mg | ORAL_CAPSULE | Freq: Every evening | ORAL | Status: DC | PRN

## 2019-01-31 MED ORDER — PENICILLIN V POTASSIUM 250 MG PO TABS
500.0000 mg | ORAL_TABLET | Freq: Two times a day (BID) | ORAL | Status: DC
  Administered 2019-01-31 – 2019-02-01 (×2): 500 mg via ORAL
  Filled 2019-01-31 (×2): qty 2

## 2019-01-31 MED ORDER — LIDOCAINE 2% (20 MG/ML) 5 ML SYRINGE
INTRAMUSCULAR | Status: AC
  Filled 2019-01-31: qty 5

## 2019-01-31 MED ORDER — METHOCARBAMOL 500 MG IVPB - SIMPLE MED
INTRAVENOUS | Status: AC
  Filled 2019-01-31: qty 50

## 2019-01-31 MED ORDER — ROCURONIUM BROMIDE 10 MG/ML (PF) SYRINGE
PREFILLED_SYRINGE | INTRAVENOUS | Status: AC
  Filled 2019-01-31: qty 10

## 2019-01-31 MED ORDER — FENTANYL CITRATE (PF) 100 MCG/2ML IJ SOLN
25.0000 ug | INTRAMUSCULAR | Status: DC | PRN
  Administered 2019-01-31 (×2): 50 ug via INTRAVENOUS

## 2019-01-31 MED ORDER — ROCURONIUM BROMIDE 10 MG/ML (PF) SYRINGE
PREFILLED_SYRINGE | INTRAVENOUS | Status: DC | PRN
  Administered 2019-01-31: 10 mg via INTRAVENOUS
  Administered 2019-01-31: 50 mg via INTRAVENOUS

## 2019-01-31 MED ORDER — LACTATED RINGERS IV SOLN
INTRAVENOUS | Status: DC
  Administered 2019-01-31 – 2019-02-01 (×2): via INTRAVENOUS

## 2019-01-31 MED ORDER — SUGAMMADEX SODIUM 200 MG/2ML IV SOLN
INTRAVENOUS | Status: DC | PRN
  Administered 2019-01-31: 200 mg via INTRAVENOUS

## 2019-01-31 MED ORDER — BISACODYL 5 MG PO TBEC: 5.0000 mg | DELAYED_RELEASE_TABLET | Freq: Every day | ORAL | Status: DC | PRN

## 2019-01-31 MED ORDER — BUPIVACAINE LIPOSOME 1.3 % IJ SUSP
INTRAMUSCULAR | Status: DC | PRN
  Administered 2019-01-31: 10 mL via PERINEURAL

## 2019-01-31 MED ORDER — TRAMADOL HCL 50 MG PO TABS: 50.0000 mg | ORAL_TABLET | Freq: Four times a day (QID) | ORAL | Status: DC | PRN

## 2019-01-31 MED ORDER — BUPIVACAINE HCL (PF) 0.5 % IJ SOLN
INTRAMUSCULAR | Status: DC | PRN
  Administered 2019-01-31: 15 mL via PERINEURAL

## 2019-01-31 MED ORDER — EPHEDRINE SULFATE-NACL 50-0.9 MG/10ML-% IV SOSY
PREFILLED_SYRINGE | INTRAVENOUS | Status: DC | PRN
  Administered 2019-01-31: 10 mg via INTRAVENOUS

## 2019-01-31 MED ORDER — SUGAMMADEX SODIUM 200 MG/2ML IV SOLN
INTRAVENOUS | Status: AC
  Filled 2019-01-31: qty 2

## 2019-01-31 MED ORDER — METHOCARBAMOL 500 MG IVPB - SIMPLE MED
500.0000 mg | Freq: Four times a day (QID) | INTRAVENOUS | Status: DC | PRN
  Administered 2019-01-31: 500 mg via INTRAVENOUS
  Filled 2019-01-31: qty 50

## 2019-01-31 MED ORDER — 0.9 % SODIUM CHLORIDE (POUR BTL) OPTIME
TOPICAL | Status: DC | PRN
  Administered 2019-01-31: 08:00:00 1000 mL

## 2019-01-31 MED ORDER — MIDAZOLAM HCL 2 MG/2ML IJ SOLN
INTRAMUSCULAR | Status: DC | PRN
  Administered 2019-01-31 (×2): 1 mg via INTRAVENOUS

## 2019-01-31 MED ORDER — DEXAMETHASONE SODIUM PHOSPHATE 10 MG/ML IJ SOLN
INTRAMUSCULAR | Status: DC | PRN
  Administered 2019-01-31: 8 mg via INTRAVENOUS

## 2019-01-31 MED ORDER — HYDROMORPHONE HCL 2 MG PO TABS
2.0000 mg | ORAL_TABLET | ORAL | Status: DC | PRN
  Administered 2019-01-31 – 2019-02-01 (×5): 2 mg via ORAL
  Filled 2019-01-31 (×5): qty 1

## 2019-01-31 MED ORDER — AMLODIPINE BESYLATE 10 MG PO TABS
10.0000 mg | ORAL_TABLET | Freq: Every morning | ORAL | Status: DC
  Administered 2019-02-01: 10 mg via ORAL
  Filled 2019-01-31: qty 1

## 2019-01-31 MED ORDER — SODIUM CHLORIDE 0.9 % IV SOLN
INTRAVENOUS | Status: DC | PRN
  Administered 2019-01-31: 25 ug/min via INTRAVENOUS

## 2019-01-31 MED ORDER — PROMETHAZINE HCL 25 MG/ML IJ SOLN: 6.2500 mg | INTRAMUSCULAR | Status: DC | PRN

## 2019-01-31 MED ORDER — METOCLOPRAMIDE HCL 5 MG/ML IJ SOLN: 5.0000 mg | Freq: Three times a day (TID) | INTRAMUSCULAR | Status: DC | PRN

## 2019-01-31 MED ORDER — LACTATED RINGERS IV SOLN
INTRAVENOUS | Status: DC
  Administered 2019-01-31: 06:00:00 via INTRAVENOUS

## 2019-01-31 MED ORDER — ONDANSETRON HCL 4 MG/2ML IJ SOLN
INTRAMUSCULAR | Status: DC | PRN
  Administered 2019-01-31: 4 mg via INTRAVENOUS

## 2019-01-31 MED ORDER — MIDAZOLAM HCL 2 MG/2ML IJ SOLN
INTRAMUSCULAR | Status: AC
  Filled 2019-01-31: qty 2

## 2019-01-31 MED ORDER — METHOCARBAMOL 500 MG PO TABS
500.0000 mg | ORAL_TABLET | Freq: Four times a day (QID) | ORAL | Status: DC | PRN
  Administered 2019-02-01: 500 mg via ORAL
  Filled 2019-01-31: qty 1

## 2019-01-31 MED ORDER — ONDANSETRON HCL 4 MG/2ML IJ SOLN: 4.0000 mg | Freq: Four times a day (QID) | INTRAMUSCULAR | Status: DC | PRN

## 2019-01-31 MED ORDER — LISINOPRIL 20 MG PO TABS
20.0000 mg | ORAL_TABLET | Freq: Every day | ORAL | Status: DC
  Administered 2019-02-01: 20 mg via ORAL
  Filled 2019-01-31: qty 1

## 2019-01-31 MED ORDER — PHENYLEPHRINE HCL (PRESSORS) 10 MG/ML IV SOLN
INTRAVENOUS | Status: AC
  Filled 2019-01-31: qty 1

## 2019-01-31 MED ORDER — ACETAMINOPHEN 500 MG PO TABS
1000.0000 mg | ORAL_TABLET | Freq: Once | ORAL | Status: AC
  Administered 2019-01-31: 1000 mg via ORAL
  Filled 2019-01-31: qty 2

## 2019-01-31 MED ORDER — GLYCOPYRROLATE PF 0.2 MG/ML IJ SOSY
PREFILLED_SYRINGE | INTRAMUSCULAR | Status: DC | PRN
  Administered 2019-01-31: .2 mg via INTRAVENOUS

## 2019-01-31 MED ORDER — TRANEXAMIC ACID-NACL 1000-0.7 MG/100ML-% IV SOLN
1000.0000 mg | INTRAVENOUS | Status: AC
  Administered 2019-01-31: 1000 mg via INTRAVENOUS
  Filled 2019-01-31: qty 100

## 2019-01-31 SURGICAL SUPPLY — 60 items
BAG ZIPLOCK 12X15 (MISCELLANEOUS) ×2 IMPLANT
BLADE SAW SGTL 83.5X18.5 (BLADE) ×2 IMPLANT
COOLER ICEMAN CLASSIC (MISCELLANEOUS) ×1 IMPLANT
COVER SURGICAL LIGHT HANDLE (MISCELLANEOUS) ×2 IMPLANT
COVER WAND RF STERILE (DRAPES) IMPLANT
CUP SUT UNIV REVERS 39 +2 (Shoulder) ×1 IMPLANT
DERMABOND ADVANCED (GAUZE/BANDAGES/DRESSINGS) ×1
DERMABOND ADVANCED .7 DNX12 (GAUZE/BANDAGES/DRESSINGS) ×1 IMPLANT
DRAPE INCISE IOBAN 66X45 STRL (DRAPES) IMPLANT
DRAPE ORTHO SPLIT 77X108 STRL (DRAPES) ×2
DRAPE SURG 17X11 SM STRL (DRAPES) ×2 IMPLANT
DRAPE SURG ORHT 6 SPLT 77X108 (DRAPES) ×2 IMPLANT
DRAPE U-SHAPE 47X51 STRL (DRAPES) ×2 IMPLANT
DRSG AQUACEL AG ADV 3.5X 6 (GAUZE/BANDAGES/DRESSINGS) ×1 IMPLANT
DRSG AQUACEL AG ADV 3.5X10 (GAUZE/BANDAGES/DRESSINGS) ×2 IMPLANT
DURAPREP 26ML APPLICATOR (WOUND CARE) ×2 IMPLANT
ELECT BLADE TIP CTD 4 INCH (ELECTRODE) ×2 IMPLANT
ELECT REM PT RETURN 15FT ADLT (MISCELLANEOUS) ×2 IMPLANT
FACESHIELD WRAPAROUND (MASK) ×6 IMPLANT
FACESHIELD WRAPAROUND OR TEAM (MASK) ×3 IMPLANT
GLENOID UNI REV MOD 24 +2 LAT (Joint) ×1 IMPLANT
GLENOSPHERE 39+4 LAT/24 UNI RV (Joint) ×1 IMPLANT
GLOVE BIO SURGEON STRL SZ7.5 (GLOVE) ×2 IMPLANT
GLOVE BIO SURGEON STRL SZ8 (GLOVE) ×2 IMPLANT
GLOVE SS BIOGEL STRL SZ 7 (GLOVE) ×1 IMPLANT
GLOVE SS BIOGEL STRL SZ 7.5 (GLOVE) ×1 IMPLANT
GLOVE SUPERSENSE BIOGEL SZ 7 (GLOVE) ×1
GLOVE SUPERSENSE BIOGEL SZ 7.5 (GLOVE) ×1
GOWN STRL REUS W/TWL LRG LVL3 (GOWN DISPOSABLE) ×4 IMPLANT
INSERT HUMERAL 39/+6 (Insert) ×1 IMPLANT
KIT BASIN OR (CUSTOM PROCEDURE TRAY) ×2 IMPLANT
KIT TURNOVER KIT A (KITS) IMPLANT
MANIFOLD NEPTUNE II (INSTRUMENTS) ×2 IMPLANT
NDL TAPERED W/ NITINOL LOOP (MISCELLANEOUS) ×1 IMPLANT
NEEDLE TAPERED W/ NITINOL LOOP (MISCELLANEOUS) ×2 IMPLANT
NS IRRIG 1000ML POUR BTL (IV SOLUTION) ×2 IMPLANT
PACK SHOULDER (CUSTOM PROCEDURE TRAY) ×2 IMPLANT
PAD ARMBOARD 7.5X6 YLW CONV (MISCELLANEOUS) ×2 IMPLANT
PAD COLD SHLDR WRAP-ON (PAD) ×1 IMPLANT
PIN SET MODULAR GLENOID SYSTEM (PIN) ×1 IMPLANT
PROTECTOR NERVE ULNAR (MISCELLANEOUS) ×2 IMPLANT
RESTRAINT HEAD UNIVERSAL NS (MISCELLANEOUS) ×2 IMPLANT
SCREW CENTRAL MOD 30MM (Screw) ×1 IMPLANT
SCREW PERI LOCK 5.5X24 (Screw) ×2 IMPLANT
SCREW PERI LOCK 5.5X36 (Screw) ×1 IMPLANT
SCREW PERIPHERAL 5.5X20 LOCK (Screw) ×1 IMPLANT
SLING ARM FOAM STRAP LRG (SOFTGOODS) ×1 IMPLANT
SLING ARM FOAM STRAP MED (SOFTGOODS) IMPLANT
SPONGE LAP 18X18 RF (DISPOSABLE) IMPLANT
STEM HUMERAL UNIVERS SZ8 (Stem) ×1 IMPLANT
SUCTION FRAZIER HANDLE 12FR (TUBING) ×1
SUCTION TUBE FRAZIER 12FR DISP (TUBING) ×1 IMPLANT
SUT MNCRL AB 3-0 PS2 18 (SUTURE) ×2 IMPLANT
SUT MON AB 2-0 CT1 36 (SUTURE) ×2 IMPLANT
SUT VIC AB 1 CT1 36 (SUTURE) ×2 IMPLANT
SUTURE TAPE 1.3 40 TPR END (SUTURE) ×2 IMPLANT
SUTURETAPE 1.3 40 TPR END (SUTURE) ×4
TOWEL OR 17X26 10 PK STRL BLUE (TOWEL DISPOSABLE) ×4 IMPLANT
TOWEL OR NON WOVEN STRL DISP B (DISPOSABLE) ×2 IMPLANT
WATER STERILE IRR 1000ML POUR (IV SOLUTION) ×4 IMPLANT

## 2019-01-31 NOTE — Anesthesia Procedure Notes (Signed)
Procedure Name: Intubation Date/Time: 01/31/2019 7:32 AM Performed by: Niel Hummer, CRNA Pre-anesthesia Checklist: Patient being monitored, Suction available, Emergency Drugs available and Patient identified Patient Re-evaluated:Patient Re-evaluated prior to induction Oxygen Delivery Method: Circle system utilized Preoxygenation: Pre-oxygenation with 100% oxygen Induction Type: IV induction Ventilation: Mask ventilation without difficulty Laryngoscope Size: Mac and 4 Grade View: Grade I Tube type: Oral Tube size: 7.5 mm Number of attempts: 1 Airway Equipment and Method: Stylet Placement Confirmation: ETT inserted through vocal cords under direct vision,  positive ETCO2 and breath sounds checked- equal and bilateral Secured at: 23 cm Tube secured with: Tape Dental Injury: Teeth and Oropharynx as per pre-operative assessment

## 2019-01-31 NOTE — H&P (Signed)
Cory Marks    Chief Complaint: right shoulder glenohumeral osteoarthritis, rotator cuff tear arthropathy HPI: The patient is a 58 y.o. male with end stage shoulder rotator cuff tear arthropathy  Past Medical History:  Diagnosis Date  . Arthritis   . Bell's palsy   . CAD (coronary artery disease)   . Cancer [facility]) 2012   Mantle Cell Cancer- S/P stem cell transplant  . CKD (chronic kidney disease) stage 3, GFR 30-59 ml/min (HCC)    per lov with pcp,  last seen at [facility] january 2020 per patient, stable per patient   . Diabetes mellitus without complication (HCC)    hx of lost weight due to cancer not on medications since 2014   . Dysrhythmia    atrial fib and atrial flutter, no repeat of dysrhymia since ablation in 2018 ,   . Eye injury    left eye hit by a wood chip early june 2020, lov for f/u with opthamologist 6-17 , drained a pustule,  progress notes mention eye gradually improving with application of antibiotic  eye drops   . History of chemotherapy last tx 2014  . History of diabetes mellitus NONE SINCE 2012 CANCER  . History of splenectomy   . Hyperlipemia   . Hypertension   . Neuromuscular disorder ([place])    peripheral neuropathy  . Neuropathy    right leg due to football injury  . Polysubstance abuse ([place])   . Sinus bradycardia     Past Surgical History:  Procedure Laterality Date  . A-FLUTTER ABLATION N/A 09/12/2016   Procedure: A-Flutter Ablation;  Surgeon: Will Meredith Leeds, MD;  Location: [place] [facility];  Service: Cardiovascular;  Laterality: N/A;  . AMPUTATION Right 10/20/2014   Procedure: AMPUTATION RIGHT GREAT TOE;  Surgeon: Gaynelle Arabian, MD;  [facility];  Service: Orthopedics;  Laterality: Right;  . AMPUTATION Right 12/25/2014   Procedure: RIGHT 2ND TOE AMPUTATION ;  Surgeon: Wylene Simmer, MD;  Location: [place];  Service: Orthopedics;  Laterality: Right;  . edentulation N/A y-2   prior to transplant  . EYE SURGERY    .  right knee surgery Right    nerve graft back of leg  . SPLENECTOMY, TOTAL     mantle cell lymphoma  . TENOLYSIS Right 12/25/2014   Procedure: RIGHT 3,4,5 FLEXOR TENDON RELEASE ;  Surgeon: Wylene Simmer, MD;  Location: [place];  Service: Orthopedics;  Laterality: Right;  . TOTAL KNEE ARTHROPLASTY Left 01/30/2017   Procedure: LEFT TOTAL KNEE ARTHROPLASTY;  Surgeon: Gaynelle Arabian, MD;  [facility];  Service: Orthopedics;  Laterality: Left;    Family History  Problem Relation Age of Onset  . Hypertension Mother   . Diabetes Mother   . Cancer Father        pancreatic cancer  . Diabetes Father   . Heart disease Father   . Diabetes Sister     Social History:  reports that he has never smoked. He has never used smokeless tobacco. He reports current alcohol use of about 1.0 standard drinks of alcohol per week. He reports that he does not use drugs.   Medications Prior to Admission  Medication Sig Dispense Refill  . amLODipine (NORVASC) 10 MG tablet Take 1 tablet (10 mg total) by mouth every morning. 90 tablet 1  . atorvastatin (LIPITOR) 40 MG tablet Take 1 tablet (40 mg total) by mouth daily. 90 tablet 1  . Cholecalciferol (VITAMIN D) 50 MCG (2000 UT)  tablet Take 2,000 Units by mouth daily.    . diclofenac sodium (VOLTAREN) 1 % GEL Apply 1 application topically 4 (four) times daily as needed (pain).     Marland Kitchen diphenhydrAMINE (BENADRYL) 25 mg capsule Take 1 capsule (25 mg total) by mouth every 6 (six) hours as needed for itching. 30 capsule 0  . gabapentin (NEURONTIN) 600 MG tablet Take 1 tablet (600 mg total) by mouth 3 (three) times daily. 270 tablet 2  . lisinopril (PRINIVIL,ZESTRIL) 20 MG tablet TAKE 1 TABLET EVERY DAY (Patient taking differently: Take 20 mg by mouth daily. ) 90 tablet 3  . morphine (MSIR) 15 MG tablet Take 15 mg by mouth 2 (two) times daily as needed for pain.    Marland Kitchen ofloxacin (OCUFLOX) 0.3 % ophthalmic solution Place 1 drop into the left eye 3 times daily.     . penicillin v potassium (VEETID) 500 MG tablet Take 500 mg by mouth 2 (two) times daily.     Marland Kitchen pyridOXINE (VITAMIN B-6) 100 MG tablet Take 100 mg by mouth daily.    . sildenafil (VIAGRA) 25 MG tablet Take 25 mg by mouth daily as needed for erectile dysfunction.    . traMADol (ULTRAM) 50 MG tablet Take by mouth.       Physical Exam: right shoulder with painful and profoundly restircteed mobility as noted at recent office visits  Vitals  Temp:  [98 F (36.7 C)-98.6 F (37 C)] 98.6 F (37 C) (06/25 0550) Pulse Rate:  [52] 52 (06/25 0550) Resp:  [21] 21 (06/25 0550) BP: (134)/(102) 134/102 (06/25 0550) SpO2:  [95 %] 95 % (06/25 0550) Weight:  [98.9 kg] 98.9 kg (06/25 0616)  Assessment/Plan  Impression: right shoulder glenohumeral osteoarthritis, rotator cuff tear arthropathy  Plan of Action: Procedure(s): REVERSE SHOULDER ARTHROPLASTY  Norene Oliveri M Lavaun Greenfield 01/31/2019, 6:36 AM Contact # [phone]

## 2019-01-31 NOTE — Anesthesia Procedure Notes (Signed)
Anesthesia Regional Block: Interscalene brachial plexus block   Pre-Anesthetic Checklist: ,, timeout performed, Correct Patient, Correct Site, Correct Laterality, Correct Procedure, Correct Position, site marked, Risks and benefits discussed,  Surgical consent,  Pre-op evaluation,  At surgeon's request and post-op pain management  Laterality: Right  Prep: chloraprep       Needles:  Injection technique: Single-shot  Needle Type: Echogenic Stimulator Needle     Needle Length: 5cm  Needle Gauge: 22     Additional Needles:   Narrative:  Start time: 01/31/2019 6:51 AM End time: 01/31/2019 7:01 AM Injection made incrementally with aspirations every 5 mL.  Performed by: Personally  Anesthesiologist: Duane Boston, MD  Additional Notes: Functioning IV was confirmed and monitors applied.  A 69mm 22ga echogenic arrow stimulator was used. Sterile prep and drape,hand hygiene and sterile gloves were used.Ultrasound guidance: relevant anatomy identified, needle position confirmed, local anesthetic spread visualized around nerve(s)., vascular puncture avoided.  Image printed for medical record.  Negative aspiration and negative test dose prior to incremental administration of local anesthetic. The patient tolerated the procedure well.

## 2019-01-31 NOTE — Discharge Instructions (Signed)
° °Kevin M. Supple, M.D., F.A.A.O.S. °Orthopaedic Surgery °Specializing in Arthroscopic and Reconstructive °Surgery of the Shoulder °[phone] °[address] - [place], [place] [address] - Fax [phone] ° ° °POST-OP TOTAL SHOULDER REPLACEMENT INSTRUCTIONS ° °1. Call the office at [phone] to schedule your first post-op appointment 10-14 days from the date of your surgery. ° °2. The bandage over your incision is waterproof. You may begin showering with this dressing on. You may leave this dressing on until first follow up appointment within 2 weeks. We prefer you leave this dressing in place until follow up however after 5-7 days if you are having itching or skin irritation and would like to remove it you may do so. Go slow and tug at the borders gently to break the bond the dressing has with the skin. At this point if there is no drainage it is okay to go without a bandage or you may cover it with a light guaze and tape. You can also expect significant bruising around your shoulder that will drift down your arm and into your chest wall. This is very normal and should resolve over several days. ° ° 3. Wear your sling/immobilizer at all times except to perform the exercises below or to occasionally let your arm dangle by your side to stretch your elbow. You also need to sleep in your sling immobilizer until instructed otherwise. It is ok to remove your sling if you are sitting in a controlled environment and allow your arm to rest in a position of comfort by your side or on your lap with pillows to give your neck and skin a break from the sling. You may remove it to allow arm to dangle by side to shower. If you are up walking around and when you go to sleep at night you need to wear it. ° °4. Range of motion to your elbow, wrist, and hand are encouraged 3-5 times daily. Exercise to your hand and fingers helps to reduce swelling you may experience. ° °5. Utilize ice to the shoulder 3-5 times  minimum a day and additionally if you are experiencing pain. ° °6. Prescriptions for a pain medication and a muscle relaxant are provided for you. It is recommended that if you are experiencing pain that you pain medication alone is not controlling, add the muscle relaxant along with the pain medication which can give additional pain relief. The first 1-2 days is generally the most severe of your pain and then should gradually decrease. As your pain lessens it is recommended that you decrease your use of the pain medications to an "as needed basis'" only and to always comply with the recommended dosages of the pain medications. ° °7. Pain medications can produce constipation along with their use. If you experience this, the use of an over the counter stool softener or laxative daily is recommended.  ° °8. For additional questions or concerns, please do not hesitate to call the office. If after hours there is an answering service to forward your concerns to the physician on call. ° °9.Pain control following an exparel block ° °To help control your post-operative pain you received a nerve block  performed with Exparel which is a long acting anesthetic (numbing agent) which can provide pain relief and sensations of numbness (and relief of pain) in the operative shoulder and arm for up to 3 days. Sometimes it provides mixed relief, meaning you may still have numbness in certain areas of the arm but can still   be able to move  parts of that arm, hand, and fingers. We recommend that your prescribed pain medications  be used as needed. We do not feel it is necessary to "pre medicate" and "stay ahead" of pain.  Taking narcotic pain medications when you are not having any pain can lead to unnecessary and potentially dangerous side effects.    10. Use the ice machine as much as possible in the first 5-7 days from surgery, then you can wean its use to as needed. The ice typically needs to be replaced every 6 hours, instead of  ice you can actually freeze water bottles to put in the cooler and then fill water around them to avoid having to purchase ice. You can have spare water bottles freezing to allow you to rotate them once they have melted. Try to have a thin shirt or light cloth or towel under the ice wrap to protect your skin.  POST-OP EXERCISES  Pendulum Exercises  Perform pendulum exercises while standing and bending at the waist. Support your uninvolved arm on a table or chair and allow your operated arm to hang freely. Make sure to do these exercises passively - not using you shoulder muscles.  Repeat 20 times. Do 3 sessions per day.

## 2019-01-31 NOTE — Op Note (Signed)
01/31/2019  9:30 AM  PATIENT:   Cory Marks  58 y.o. male  PRE-OPERATIVE DIAGNOSIS:  right shoulder glenohumeral osteoarthritis, rotator cuff tear arthropathy  POST-OPERATIVE DIAGNOSIS: Same  PROCEDURE: Right shoulder reverse arthroplasty utilizing a press-fit size 8 Arthrex stem with a +6 polyethylene insert, 39/+4 glenosphere on a small baseplate/+2.  SURGEON:  Marin Shutter M.D.  ASSISTANTS: Jenetta Loges, PA-C  ANESTHESIA:   General endotracheal and interscalene block with Exparel  EBL: 200 cc  SPECIMEN: None  Drains: None   PATIENT DISPOSITION:  PACU - hemodynamically stable.    PLAN OF CARE: Admit for overnight observation  Brief history:  Is a 58 year old male with chronic and progressively increasing right shoulder pain related to end-stage rotator cuff tear arthropathy.  Plain radiographs confirm advanced arthritis with a recent MRI scan confirming a large and retracted tear of the rotator cuff.  Due to his increasing pain and functional limitations he is brought to the operating room this time for planned right shoulder reverse arthroplasty  Preoperatively and counseled Cory Marks regarding treatment options as well as the potential risks versus benefits thereof.  Possible surgical complications were reviewed including bleeding, infection, neurovascular injury, persistent pain, loss of motion, anesthetic complication, failure of the implant, and possible need for additional surgery.  He understands, and accepts, and agrees with the plan procedure.  Procedure in detail:  After undergoing routine preop evaluation patient received prophylactic antibiotics and an interscalene block with Exparel was established in the holding area by the anesthesia department.  Patient received prophylactic antibiotics in the form of both vancomycin and Ancef due to his MRSA carrier status.  Brought to the operating placed spine on the operating table underwent smooth induction of a  general endotracheal anesthesia.  Placed into the beachchair position and appropriately padded and protected.  Right shoulder girdle region was sterilely prepped and draped in standard fashion.  Timeout was called.  An anterior deltopectoral approach to the right shoulder is made through 10 cm incision.  Skin flaps are elevated dissection carried deeply and the deltopectoral interval was developed although there was not a distinct interval and no dominant cephalic vein.  Dissection carried deep beneath the deltoid and the conjoined tendon was then mobilized and retracted medially.  Upper centimeter of the pectoralis major tendon was then tenotomized for exposure and then the biceps tendon was unroofed and tenodesed at the upper border the pectoralis major tendon and the proximal segment was then excised.  The subscapularis was then elevated away from the lesser tuberosity with a free margin tagged with a pair of suture tape sutures and then reflected medially.  Capsular attachments from the anterior and inferior margins of the humeral neck were then divided subperiosteally and the humeral head was delivered through the wound.  Extra medullary guide was then used to outline the proposed humeral head resection which was then performed with an oscillating saw maintain the native retroversion of approximate 20 degrees.  Peripheral osteophytes were removed.  Metal cap placed to the proximal humeral surface.  The glenoid was then exposed with appropriate retractors and I performed a circumferential labral resection gaining complete visualization of the periphery of the glenoid.  A guidepin was then introduced into the center of the glenoid with an approximately 10 degree inferior tilt and the glenoid reamer was then utilized both centrally and peripherally obtaining a stable subchondral bony surface.  Our central drill hole was then completed and tapped and our small baseplate with a 30 mm  lag screw was then introduced  obtaining excellent purchase and fixation.  The peripheral locking screws were all then placed with excellent bony purchase and fixation.  A 39/+4 glenosphere was then impacted onto the baseplate and the central locking screw was then tightened with excellent fixation.  We then returned our attention to the proximal humerus where the canal was prepared and reaming by hand up to size 7 broaching up to size 8 at the native retroversion with excellent fit fixation.  The metaphyseal reamer was then utilized to prepare the metaphysis and a trial was then inserted and trial reduction showed good soft tissue balance good motion good stability.  Trial was then removed the final implant was assembled on the back table with a size 8 stem with a posterior offset metaphysis and a final implant was then impacted into the humerus with excellent interference fit fixation.  Trial reductions were then performed and at this time a +6 polyethylene insert gave [place] the best soft tissue balance.  A final +6 poly-was then impacted into the stem and a final reduction was then performed after the joint was copiously irrigated.  Final construct showed excellent shoulder motion good stability good soft tissue balance.  At this point the subscapularis was then repaired back to the region of the lesser tuberosity utilizing the eyelets on the collar of our humeral stem.  Final irrigation was then completed.  Hemostasis was obtained.  The deltopectoral interval was then reapproximated with a series of figure-of-eight #1 Vicryl sutures.  2-0 Vicryl used for the subcu layer and intracuticular 3-0 Monocryl for the skin followed by Dermabond and Aquasol dressing the right arm was then placed into a sling and the patient was awakened, extubated, and taken recovery in stable addition.  Jenetta Loges, PA-C was used as an Environmental consultant throughout this case essential for help with positioning of the patient, positioning extremity, tissue manipulation,  implantation of the prosthesis, wound closure, and intraoperative decision-making.  Metta Clines Barkley Kratochvil MD   Contact # [phone]

## 2019-01-31 NOTE — Plan of Care (Signed)
Plan of care 

## 2019-01-31 NOTE — Anesthesia Postprocedure Evaluation (Signed)
Anesthesia Post Note  Patient: Cory Marks  Procedure(s) Performed: REVERSE SHOULDER ARTHROPLASTY (Right Shoulder)     Patient location during evaluation: PACU Anesthesia Type: General Level of consciousness: sedated Pain management: pain level controlled Vital Signs Assessment: post-procedure vital signs reviewed and stable Respiratory status: spontaneous breathing and respiratory function stable Cardiovascular status: stable Postop Assessment: no apparent nausea or vomiting Anesthetic complications: no    Last Vitals:  Vitals:   01/31/19 1030 01/31/19 1100  BP:  116/82  Pulse: (!) 50 (!) 51  Resp: 11 16  Temp:    SpO2: (!) 88% 100%    Last Pain:  Vitals:   01/31/19 1015  TempSrc:   PainSc: Asleep                 Arushi Partridge DANIEL

## 2019-01-31 NOTE — Transfer of Care (Signed)
Immediate Anesthesia Transfer of Care Note  Patient: Cory Marks  Procedure(s) Performed: REVERSE SHOULDER ARTHROPLASTY (Right Shoulder)  Patient Location: PACU  Anesthesia Type:General  Level of Consciousness: awake, alert  and oriented  Airway & Oxygen Therapy: Patient Spontanous Breathing and Patient connected to face mask oxygen  Post-op Assessment: Report given to RN and Post -op Vital signs reviewed and stable  Post vital signs: Reviewed and stable  Last Vitals:  Vitals Value Taken Time  BP 138/99 01/31/19 0942  Temp    Pulse 54 01/31/19 0943  Resp 16 01/31/19 0943  SpO2 98 % 01/31/19 0943  Vitals shown include unvalidated device data.  Last Pain:  Vitals:   01/31/19 0616  TempSrc:   PainSc: 5       Patients Stated Pain Goal: 5 (97/67/34 1937)  Complications: No apparent anesthesia complications

## 2019-02-01 ENCOUNTER — Encounter (HOSPITAL_COMMUNITY): Payer: Self-pay | Admitting: Orthopedic Surgery

## 2019-02-01 MED ORDER — CYCLOBENZAPRINE HCL 10 MG PO TABS: 10.0000 mg | ORAL_TABLET | Freq: Three times a day (TID) | ORAL | 1 refills | Status: DC | PRN

## 2019-02-01 MED ORDER — HYDROMORPHONE HCL 2 MG PO TABS: 2.0000 mg | ORAL_TABLET | ORAL | 0 refills | Status: DC | PRN

## 2019-02-01 NOTE — Evaluation (Signed)
Occupational Therapy Evaluation Patient Details Name: Cory Marks MRN: [id number] DOB: 04-16-1961 Today's Date: 02/01/2019    History of Present Illness 58 year old man s/p R RTSA.     Clinical Impression   Pt was admitted for the above sx.  All education was completed.  Of note, pt is missing 2 toes and has peripheral neuropathy. He had a LOB when he first got up to walk, but this improved, and he walked the whole rectangle of the unit.  He will be home with his 54 year old mother and feels safe.  He can get a shower seat for increased safety. Worked through ADL and allowed exercises.  Pt will follow up with Dr Onnie Graham    Follow Up Recommendations  Follow surgeon's recommendation for DC plan and follow-up therapies    Equipment Recommendations  None recommended by OT    Recommendations for Other Services       Precautions / Restrictions Precautions Precautions: Shoulder;Fall Type of Shoulder Precautions: can come out of sling in sitting, A/AA/PROM 20 ER, 45 Abd and 60 FF during adls. May do pendulums and lap slides. AROM elbow to fingers, no resisted IR.  Also missing great and 2nd toe on R foot Shoulder Interventions: Shoulder sling/immobilizer Precaution Booklet Issued: Yes (comment) Restrictions Weight Bearing Restrictions: Yes Other Position/Activity Restrictions: NWB      Mobility Bed Mobility Overal bed mobility: Independent                Transfers Overall transfer level: Independent                    Balance Overall balance assessment: Mild deficits observed, not formally tested                                         ADL either performed or assessed with clinical judgement   ADL Overall ADL's : Needs assistance/impaired Eating/Feeding: Set up   Grooming: Set up   Upper Body Bathing: Moderate assistance   Lower Body Bathing: Minimal assistance   Upper Body Dressing : Moderate assistance   Lower Body Dressing:  Moderate assistance   Toilet Transfer: Min guard   Toileting- Clothing Manipulation and Hygiene: Minimal assistance         General ADL Comments: performed ADL, walked in hall, and performed exercises. Pt verbalizes understanding of all.  He is missing piece in shoe to take up space for missing toes on R.  Min guard for safety.  Pt had a slight LOB in the beginning of walk, which he self corrected     Vision         Perception     Praxis      Pertinent Vitals/Pain Pain Assessment: No/denies pain(block in effect)     Hand Dominance Right   Extremity/Trunk Assessment Upper Extremity Assessment Upper Extremity Assessment: RUE deficits/detail(immobilized, can move fingers; block in effect)           Communication Communication Communication: No difficulties   Cognition Arousal/Alertness: Awake/alert Behavior During Therapy: WFL for tasks assessed/performed Overall Cognitive Status: Within Functional Limits for tasks assessed                                     General Comments       Exercises Exercises: Other  exercises Other Exercises Other Exercises: worked on pendulums; block in effect. Pt has neuropathy; showed him how to do seated pendulums.  Also performed lap slides and distal ROM (AA/PROM)   Shoulder Instructions      Home Living Family/patient expects to be discharged to:: Private residence                   Bathroom Shower/Tub: Tub/shower unit   Bathroom Toilet: Standard         Additional Comments: wtih 46 y o mother.  Can get a seat for shower      Prior Functioning/Environment Level of Independence: Independent                 OT Problem List:        OT Treatment/Interventions:      OT Goals(Current goals can be found in the care plan section) Acute Rehab OT Goals Patient Stated Goal: return to independence OT Goal Formulation: All assessment and education complete, DC therapy  OT Frequency:      Barriers to D/C:            Co-evaluation              AM-PAC OT "6 Clicks" Daily Activity     Outcome Measure Help from another person eating meals?: A Little Help from another person taking care of personal grooming?: A Little Help from another person toileting, which includes using toliet, bedpan, or urinal?: A Little Help from another person bathing (including washing, rinsing, drying)?: A Little Help from another person to put on and taking off regular upper body clothing?: A Lot Help from another person to put on and taking off regular lower body clothing?: A Lot 6 Click Score: 16   End of Session Nurse Communication: (ready for d/c)  Activity Tolerance: Patient tolerated treatment well Patient left: in chair;with call bell/phone within reach  OT Visit Diagnosis: Muscle weakness (generalized) (M62.81)                Time: 5993-5701 OT Time Calculation (min): 51 min Charges:  OT General Charges $OT Visit: 1 Visit OT Evaluation $OT Eval Low Complexity: 1 Low OT Treatments $Self Care/Home Management : 8-22 mins $Therapeutic Exercise: 8-22 mins  Lesle Chris, OTR/L Acute Rehabilitation Services [phone] [facility] pager [phone] office 02/01/2019  [place] 02/01/2019, 10:04 AM

## 2019-02-01 NOTE — Discharge Summary (Signed)
PATIENT ID:      Cory Marks  MRN:     [id number] DOB/AGE:    59-Jan-1962 / 59 y.o.     DISCHARGE SUMMARY  ADMISSION DATE:    01/31/2019 DISCHARGE DATE:    ADMISSION DIAGNOSIS: right shoulder glenohumeral osteoarthritis, rotator cuff tear arthropathy Past Medical History:  Diagnosis Date  . Arthritis   . Bell's palsy   . CAD (coronary artery disease)   . Cancer [facility]) 2012   Mantle Cell Cancer- S/P stem cell transplant  . CKD (chronic kidney disease) stage 3, GFR 30-59 ml/min (HCC)    per lov with pcp,  last seen at [facility] january 2020 per patient, stable per patient   . Diabetes mellitus without complication (HCC)    hx of lost weight due to cancer not on medications since 2014   . Dysrhythmia    atrial fib and atrial flutter, no repeat of dysrhymia since ablation in 2018 ,   . Eye injury    left eye hit by a wood chip early june 2020, lov for f/u with opthamologist 6-17 , drained a pustule,  progress notes mention eye gradually improving with application of antibiotic  eye drops   . History of chemotherapy last tx 2014  . History of diabetes mellitus NONE SINCE 2012 CANCER  . History of splenectomy   . Hyperlipemia   . Hypertension   . Neuromuscular disorder ([place])    peripheral neuropathy  . Neuropathy    right leg due to football injury  . Polysubstance abuse ([place])   . Sinus bradycardia     DISCHARGE DIAGNOSIS:   Active Problems:   S/P reverse total shoulder arthroplasty, right   PROCEDURE: Procedure(s): REVERSE SHOULDER ARTHROPLASTY on 01/31/2019  CONSULTS:    HISTORY:  See H&P in chart.  HOSPITAL COURSE:  Cory Marks is a 58 y.o. admitted on 01/31/2019 with a diagnosis of right shoulder glenohumeral osteoarthritis, rotator cuff tear arthropathy.  They were brought to the operating room on 01/31/2019 and underwent Procedure(s): [place].    They were given perioperative antibiotics:  Anti-infectives (From admission, onward)   Start     Dose/Rate Route Frequency Ordered Stop   01/31/19 2200  penicillin v potassium (VEETID) tablet 500 mg     500 mg Oral 2 times daily 01/31/19 1102     01/31/19 0600  ceFAZolin (ANCEF) IVPB 2g/100 mL premix     2 g 200 mL/hr over 30 Minutes Intravenous On call to O.R. 01/31/19 0550 01/31/19 0802   01/31/19 0550  vancomycin (VANCOCIN) IVPB 1000 mg/200 mL premix     1,000 mg 200 mL/hr over 60 Minutes Intravenous 30 min pre-op 01/31/19 0550 01/31/19 0859    .  Patient underwent the above named procedure and tolerated it well. The following day they were hemodynamically stable and pain was controlled on oral analgesics. They were neurovascularly intact to the operative extremity. OT was ordered and worked with patient per protocol. They were medically and orthopaedically stable for discharge on .    DIAGNOSTIC STUDIES:  RECENT RADIOGRAPHIC STUDIES :  No results found.  RECENT VITAL SIGNS:   Patient Vitals for the past 24 hrs:  BP Temp Temp src Pulse Resp SpO2 Height Weight  02/01/19 0448 (!) 129/94 98.1 F (36.7 C) Oral (!) 45 16 100 % - -  02/01/19 0151 (!) 133/98 98.4 F (36.9 C) Oral (!) 48 16 100 % - -  01/31/19 2137 (!) 143/99 98.8 F (37.1  C) Oral (!) 52 16 100 % - -  01/31/19 1809 (!) 133/96 97.6 F (36.4 C) - (!) 48 16 97 % - -  01/31/19 1418 119/81 98 F (36.7 C) - (!) 47 16 100 % - -  01/31/19 1325 131/86 97.8 F (36.6 C) Oral (!) 46 16 100 % - -  01/31/19 1202 121/89 98.6 F (37 C) Oral (!) 49 16 100 % - -  01/31/19 1100 116/82 97.9 F (36.6 C) - (!) 51 16 100 % - -  01/31/19 1100 - - - - - - 6' (1.829 m) 98 kg  01/31/19 1030 - - - (!) 50 11 (!) 88 % - -  01/31/19 1015 - (!) 97.3 F (36.3 C) - - - - - -  01/31/19 0942 (!) 138/99 (!) 97.3 F (36.3 C) - (!) 53 12 98 % - -  .  RECENT EKG RESULTS:    Orders placed or performed during the hospital encounter of 01/28/19  . EKG 12 lead  . EKG 12 lead    DISCHARGE INSTRUCTIONS:    DISCHARGE MEDICATIONS:    Allergies as of 02/01/2019      Reactions   Oxycodone Itching, Palpitations      Medication List    TAKE these medications   amLODipine 10 MG tablet Commonly known as: NORVASC Take 1 tablet (10 mg total) by mouth every morning.   atorvastatin 40 MG tablet Commonly known as: LIPITOR Take 1 tablet (40 mg total) by mouth daily.   cyclobenzaprine 10 MG tablet Commonly known as: FLEXERIL Take 1 tablet (10 mg total) by mouth 3 (three) times daily as needed for muscle spasms.   diclofenac sodium 1 % Gel Commonly known as: VOLTAREN Apply 1 application topically 4 (four) times daily as needed (pain).   diphenhydrAMINE 25 mg capsule Commonly known as: Benadryl Take 1 capsule (25 mg total) by mouth every 6 (six) hours as needed for itching.   gabapentin 600 MG tablet Commonly known as: Neurontin Take 1 tablet (600 mg total) by mouth 3 (three) times daily.   HYDROmorphone 2 MG tablet Commonly known as: Dilaudid Take 1 tablet (2 mg total) by mouth every 4 (four) hours as needed.   lisinopril 20 MG tablet Commonly known as: ZESTRIL TAKE 1 TABLET EVERY DAY   morphine 15 MG tablet Commonly known as: MSIR Take 15 mg by mouth 2 (two) times daily as needed for pain.   ofloxacin 0.3 % ophthalmic solution Commonly known as: OCUFLOX Place 1 drop into the left eye 3 times daily.   penicillin v potassium 500 MG tablet Commonly known as: VEETID Take 500 mg by mouth 2 (two) times daily.   pyridOXINE 100 MG tablet Commonly known as: VITAMIN B-6 Take 100 mg by mouth daily.   sildenafil 25 MG tablet Commonly known as: VIAGRA Take 25 mg by mouth daily as needed for erectile dysfunction.   traMADol 50 MG tablet Commonly known as: ULTRAM Take by mouth.   Vitamin D 50 MCG (2000 UT) tablet Take 2,000 Units by mouth daily.       FOLLOW UP VISIT:   Follow-up Information    Justice Britain, MD.   Specialty: Orthopedic Surgery Why: call to be seen in 10-14 days Contact  information: [address] [place] [address] [phone]           DISCHARGE OQ:HUTM   DISCHARGE CONDITION:  Thereasa Parkin Cory Marks for Dr. Justice Britain 02/01/2019,  8:35 AM

## 2019-02-08 NOTE — Progress Notes (Signed)
Subjective: Cory Marks presents today with history of diabetes (dx 11/17/2018) and neuropathy. Patient seen for follow up of chronic, painful mycotic toenails and preulcerative callus right 5th digit which interfere with daily activities and routine tasks.  Pain is aggravated when wearing enclosed shoe gear. Pain is getting progressively worse and relieved with periodic professional debridement.   Patient states he has a wound on the dorsal aspect of his right foot. He is unsure of mechanism of injury, but area was healed and he pulled the scab off of it.  Cory Serene, MD is his PCP. Last visit was May 2020, per patient recall.   Current Outpatient Medications:  .  amLODipine (NORVASC) 10 MG tablet, Take 1 tablet (10 mg total) by mouth every morning., Disp: 90 tablet, Rfl: 1 .  atorvastatin (LIPITOR) 40 MG tablet, Take 1 tablet (40 mg total) by mouth daily., Disp: 90 tablet, Rfl: 1 .  Cholecalciferol (VITAMIN D) 50 MCG (2000 UT) tablet, Take 2,000 Units by mouth daily., Disp: , Rfl:  .  diclofenac sodium (VOLTAREN) 1 % GEL, Apply 1 application topically 4 (four) times daily as needed (pain). , Disp: , Rfl:  .  diphenhydrAMINE (BENADRYL) 25 mg capsule, Take 1 capsule (25 mg total) by mouth every 6 (six) hours as needed for itching., Disp: 30 capsule, Rfl: 0 .  gabapentin (NEURONTIN) 600 MG tablet, Take 1 tablet (600 mg total) by mouth 3 (three) times daily., Disp: 270 tablet, Rfl: 2 .  lisinopril (PRINIVIL,ZESTRIL) 20 MG tablet, TAKE 1 TABLET EVERY DAY (Patient taking differently: Take 20 mg by mouth daily. ), Disp: 90 tablet, Rfl: 3 .  morphine (MSIR) 15 MG tablet, Take 15 mg by mouth 2 (two) times daily as needed for pain., Disp: , Rfl:  .  ofloxacin (OCUFLOX) 0.3 % ophthalmic solution, Place 1 drop into the left eye 3 times daily., Disp: , Rfl:  .  penicillin v potassium (VEETID) 500 MG tablet, Take 500 mg by mouth 2 (two) times daily. , Disp: , Rfl:  .  pyridOXINE (VITAMIN B-6) 100 MG  tablet, Take 100 mg by mouth daily., Disp: , Rfl:  .  sildenafil (VIAGRA) 25 MG tablet, Take 25 mg by mouth daily as needed for erectile dysfunction., Disp: , Rfl:  .  traMADol (ULTRAM) 50 MG tablet, Take by mouth., Disp: , Rfl:  .  cyclobenzaprine (FLEXERIL) 10 MG tablet, Take 1 tablet (10 mg total) by mouth 3 (three) times daily as needed for muscle spasms., Disp: 30 tablet, Rfl: 1 .  HYDROmorphone (DILAUDID) 2 MG tablet, Take 1 tablet (2 mg total) by mouth every 4 (four) hours as needed., Disp: 20 tablet, Rfl: 0  Allergies  Allergen Reactions  . Oxycodone Itching and Palpitations    Objective: Vitals:   01/30/19 1136  Temp: 98 F (36.7 C)    Vascular Examination: Capillary refill time immediate x 10 digits.  Dorsalis pedis pulses present b/l.  Posterior tibial pulses present b/l.  No digital hair x 10 digits.  Skin temperature WNL b/l.  Dermatological Examination: Skin with normal turgor, texture and tone b/l.  Superficial abrasion with partial scab overlying exposed granular area 1.5 x 1.5 cm. No erythema, no edema, no drainage, no flocculence, no odor.  Toenails 3-5 right, 1-5 left are discolored, thick, dystrophic with subungual debris and pain with palpation to nailbeds due to thickness of nails.  Hyperkeratotic lesion(s) right 5th digit with subdermal hemorrhage, distal tip left hallux, submet head 5 left foot. No  erythema, no edema, no drainage, no flocculence noted.   Well healed surgical scars right great toe and right 2nd digit amputation sites.  Musculoskeletal: Muscle strength 5/5 to all LE muscle groups.  Amputation noted right great toe and right 2nd digit.  Neurological: Sensation diminished with 10 gram monofilament.  Vibratory sensation diminished b/l.  Assessment: 1. Painful onychomycosis toenails 1-5 left, 3-5 2. Abrasion right foot, healing 3. Calluses right 5th digit, distal tip left hallux, submet head 5 left foot 4. NIDDM with  neuropathy 5. S/p amputation right great toe and right 2nd digit  Plan: 1. Continue diabetic foot care principles. 2. Toenails 1-5 left, 3-5 right were debrided in length and girth without iatrogenic bleeding. 3. For abrasion, he was instructed to apply antibiotic ointment once daily until healed. Call office should he experience any problems. 4. Calluses pared right 5th digit, distal tip left hallux, submet head 5 left foot utilizing sterile scalpel blade without incident.  5. Patient to continue soft, supportive shoe gear daily. 6. Patient to report any pedal injuries to medical professional immediately. 7. Follow up 3 months.  8. Patient/POA to call should there be a concern in the interim.

## 2019-03-05 ENCOUNTER — Other Ambulatory Visit: Payer: Self-pay | Admitting: Internal Medicine

## 2019-04-26 ENCOUNTER — Ambulatory Visit (INDEPENDENT_AMBULATORY_CARE_PROVIDER_SITE_OTHER): Payer: Medicare HMO | Admitting: Podiatry

## 2019-04-26 ENCOUNTER — Other Ambulatory Visit: Payer: Self-pay

## 2019-04-26 ENCOUNTER — Encounter: Payer: Self-pay | Admitting: Podiatry

## 2019-04-26 DIAGNOSIS — L84 Corns and callosities: Secondary | ICD-10-CM

## 2019-04-26 DIAGNOSIS — B351 Tinea unguium: Secondary | ICD-10-CM

## 2019-04-26 DIAGNOSIS — E1142 Type 2 diabetes mellitus with diabetic polyneuropathy: Secondary | ICD-10-CM

## 2019-04-26 NOTE — Patient Instructions (Signed)
Diabetes Mellitus and Foot Care Foot care is an important part of your health, especially when you have diabetes. Diabetes may cause you to have problems because of poor blood flow (circulation) to your feet and legs, which can cause your skin to:  Become thinner and drier.  Break more easily.  Heal more slowly.  Peel and crack. You may also have nerve damage (neuropathy) in your legs and feet, causing decreased feeling in them. This means that you may not notice minor injuries to your feet that could lead to more serious problems. Noticing and addressing any potential problems early is the best way to prevent future foot problems. How to care for your feet Foot hygiene  Wash your feet daily with warm water and mild soap. Do not use hot water. Then, pat your feet and the areas between your toes until they are completely dry. Do not soak your feet as this can dry your skin.  Trim your toenails straight across. Do not dig under them or around the cuticle. File the edges of your nails with an emery board or nail file.  Apply a moisturizing lotion or petroleum jelly to the skin on your feet and to dry, brittle toenails. Use lotion that does not contain alcohol and is unscented. Do not apply lotion between your toes. Shoes and socks  Wear clean socks or stockings every day. Make sure they are not too tight. Do not wear knee-high stockings since they may decrease blood flow to your legs.  Wear shoes that fit properly and have enough cushioning. Always look in your shoes before you put them on to be sure there are no objects inside.  To break in new shoes, wear them for just a few hours a day. This prevents injuries on your feet. Wounds, scrapes, corns, and calluses  Check your feet daily for blisters, cuts, bruises, sores, and redness. If you cannot see the bottom of your feet, use a mirror or ask someone for help.  Do not cut corns or calluses or try to remove them with medicine.  If you  find a minor scrape, cut, or break in the skin on your feet, keep it and the skin around it clean and dry. You may clean these areas with mild soap and water. Do not clean the area with peroxide, alcohol, or iodine.  If you have a wound, scrape, corn, or callus on your foot, look at it several times a day to make sure it is healing and not infected. Check for: ? Redness, swelling, or pain. ? Fluid or blood. ? Warmth. ? Pus or a bad smell. General instructions  Do not cross your legs. This may decrease blood flow to your feet.  Do not use heating pads or hot water bottles on your feet. They may burn your skin. If you have lost feeling in your feet or legs, you may not know this is happening until it is too late.  Protect your feet from hot and cold by wearing shoes, such as at the beach or on hot pavement.  Schedule a complete foot exam at least once a year (annually) or more often if you have foot problems. If you have foot problems, report any cuts, sores, or bruises to your health care provider immediately. Contact a health care provider if:  You have a medical condition that increases your risk of infection and you have any cuts, sores, or bruises on your feet.  You have an injury that is not   healing.  You have redness on your legs or feet.  You feel burning or tingling in your legs or feet.  You have pain or cramps in your legs and feet.  Your legs or feet are numb.  Your feet always feel cold.  You have pain around a toenail. Get help right away if:  You have a wound, scrape, corn, or callus on your foot and: ? You have pain, swelling, or redness that gets worse. ? You have fluid or blood coming from the wound, scrape, corn, or callus. ? Your wound, scrape, corn, or callus feels warm to the touch. ? You have pus or a bad smell coming from the wound, scrape, corn, or callus. ? You have a fever. ? You have a red line going up your leg. Summary  Check your feet every day  for cuts, sores, red spots, swelling, and blisters.  Moisturize feet and legs daily.  Wear shoes that fit properly and have enough cushioning.  If you have foot problems, report any cuts, sores, or bruises to your health care provider immediately.  Schedule a complete foot exam at least once a year (annually) or more often if you have foot problems. This information is not intended to replace advice given to you by your health care provider. Make sure you discuss any questions you have with your health care provider. Document Released: 07/22/2000 Document Revised: 09/06/2017 Document Reviewed: 08/26/2016 Elsevier Patient Education  2020 [employer]   Onychomycosis/Fungal Toenails  WHAT IS IT? An infection that lies within the keratin of your nail plate that is caused by a fungus.  WHY ME? Fungal infections affect all ages, sexes, races, and creeds.  There may be many factors that predispose you to a fungal infection such as age, coexisting medical conditions such as diabetes, or an autoimmune disease; stress, medications, fatigue, genetics, etc.  Bottom line: fungus thrives in a warm, moist environment and your shoes offer such a location.  IS IT CONTAGIOUS? Theoretically, yes.  You do not want to share shoes, nail clippers or files with someone who has fungal toenails.  Walking around barefoot in the same room or sleeping in the same bed is unlikely to transfer the organism.  It is important to realize, however, that fungus can spread easily from one nail to the next on the same foot.  HOW DO WE TREAT THIS?  There are several ways to treat this condition.  Treatment may depend on many factors such as age, medications, pregnancy, liver and kidney conditions, etc.  It is best to ask your doctor which options are available to you.  1. No treatment.   Unlike many other medical concerns, you can live with this condition.  However for many people this can be a painful condition and may lead to  ingrown toenails or a bacterial infection.  It is recommended that you keep the nails cut short to help reduce the amount of fungal nail. 2. Topical treatment.  These range from herbal remedies to prescription strength nail lacquers.  About 40-50% effective, topicals require twice daily application for approximately 9 to 12 months or until an entirely new nail has grown out.  The most effective topicals are medical grade medications available through physicians offices. 3. Oral antifungal medications.  With an 80-90% cure rate, the most common oral medication requires 3 to 4 months of therapy and stays in your system for a year as the new nail grows out.  Oral antifungal medications do require   blood work to make sure it is a safe drug for you.  A liver function panel will be performed prior to starting the medication and after the first month of treatment.  It is important to have the blood work performed to avoid any harmful side effects.  In general, this medication safe but blood work is required. 4. Laser Therapy.  This treatment is performed by applying a specialized laser to the affected nail plate.  This therapy is noninvasive, fast, and non-painful.  It is not covered by insurance and is therefore, out of pocket.  The results have been very good with a 80-95% cure rate.  [facility] is the only practice in the area to offer this therapy. 5. Permanent Nail Avulsion.  Removing the entire nail so that a new nail will not grow back. 

## 2019-05-02 NOTE — Progress Notes (Addendum)
Subjective: Cory Marks presents with diabetes, diabetic neuropathy and cc of chronic preulcerative callus right 5th digit and  discolored, thick toenails which place him at risk. He has h/o amputation right great toe and right 2nd toe.   [name], MD is his PCP.   Current Outpatient Medications on File Prior to Visit  Medication Sig Dispense Refill  . amitriptyline (ELAVIL) 50 MG tablet TK 1 T PO QD HS    . amLODipine (NORVASC) 10 MG tablet Take 1 tablet (10 mg total) by mouth every morning. 90 tablet 1  . atorvastatin (LIPITOR) 40 MG tablet Take 1 tablet (40 mg total) by mouth daily. 90 tablet 1  . Cholecalciferol (VITAMIN D) 50 MCG (2000 UT) tablet Take 2,000 Units by mouth daily.    . cyclobenzaprine (FLEXERIL) 10 MG tablet Take 1 tablet (10 mg total) by mouth 3 (three) times daily as needed for muscle spasms. 30 tablet 1  . diclofenac sodium (VOLTAREN) 1 % GEL Apply 1 application topically 4 (four) times daily as needed (pain).     Marland Kitchen diphenhydrAMINE (BENADRYL) 25 mg capsule Take 1 capsule (25 mg total) by mouth every 6 (six) hours as needed for itching. 30 capsule 0  . gabapentin (NEURONTIN) 300 MG capsule     . gabapentin (NEURONTIN) 600 MG tablet Take 1 tablet (600 mg total) by mouth 3 (three) times daily. 270 tablet 2  . HYDROmorphone (DILAUDID) 2 MG tablet Take 1 tablet (2 mg total) by mouth every 4 (four) hours as needed. 20 tablet 0  . lisinopril (PRINIVIL,ZESTRIL) 20 MG tablet TAKE 1 TABLET EVERY DAY (Patient taking differently: Take 20 mg by mouth daily. ) 90 tablet 3  . morphine (MSIR) 15 MG tablet Take 15 mg by mouth 2 (two) times daily as needed for pain.    Marland Kitchen ofloxacin (OCUFLOX) 0.3 % ophthalmic solution Place 1 drop into the left eye 3 times daily.    . penicillin v potassium (VEETID) 500 MG tablet Take 500 mg by mouth 2 (two) times daily.     Marland Kitchen pyridOXINE (VITAMIN B-6) 100 MG tablet Take 100 mg by mouth daily.    . sildenafil (VIAGRA) 25 MG tablet Take 25 mg by  mouth daily as needed for erectile dysfunction.    . temazepam (RESTORIL) 30 MG capsule TK 1 C PO QHS PRF SLP    . traMADol (ULTRAM) 50 MG tablet Take by mouth.     No current facility-administered medications on file prior to visit.     Allergies  Allergen Reactions  . Oxycodone Itching and Palpitations    Objective:  Vascular Examination: Capillary refill time immediate x 8 digits.  Dorsalis pedis pulses present b/l.  Posterior tibial pulses present b/l.  Digital hair absent x 8 digits.  Skin temperature gradient WNL b/l.  Dermatological Examination: Skin with normal turgor, texture and tone b/l.  Toenails 1-5 left, 3-4 right discolored, thick, dystrophic with subungual debris.  Anonychia right 5th digit. Nailbed completely epithelialized and intact.  Hyperkeratotic lesion right 5th digit with subdermal hemorrhage. No erythema, no edema, no drainage, no flocculence noted.   Well healed surgical scars from amputations hallux and 2nd digit right foot.  Musculoskeletal: Muscle strength 5/5 to all LE muscle groups.  Adductovarus 5th digit right foot.  S/p amputations hallux and 2nd digit right foot.  No pain, crepitus or joint limitation with passive/active ROM.  Neurological: Sensation diminished with 10 gram monofilament.  Vibratory sensation diminished  Assessment: 1. Onychomycosis toenails  1-5 left, 3, 4 right 2. S/p amputation hallux and 2nd digit right foot 3. Preulcerative callus right 5th digit 4. NIDDM with Diabetic neuropathy  Plan: 1. Continue diabetic foot care principles. Literature dispensed on today. 2. Toenails 1-5 left, 3, 4 right were debrided in length and girth without iatrogenic bleeding. 3. Preulcerative callus pared right 5th digit utilizing sterile scalpel blade without incident. Continue Silipos digital toe cap daily for protection. 4. Patient to continue soft, supportive shoe gear. 5. Patient to report any pedal injuries to medical  professional  6. Follow up 9 weeks. 7. Patient/POA to call should there be a concern in the interim.

## 2019-07-15 ENCOUNTER — Ambulatory Visit (INDEPENDENT_AMBULATORY_CARE_PROVIDER_SITE_OTHER): Payer: Medicare HMO | Admitting: Podiatry

## 2019-07-15 ENCOUNTER — Ambulatory Visit (INDEPENDENT_AMBULATORY_CARE_PROVIDER_SITE_OTHER): Payer: Medicare HMO

## 2019-07-15 ENCOUNTER — Encounter: Payer: Self-pay | Admitting: Podiatry

## 2019-07-15 ENCOUNTER — Other Ambulatory Visit: Payer: Self-pay

## 2019-07-15 DIAGNOSIS — L84 Corns and callosities: Secondary | ICD-10-CM | POA: Diagnosis not present

## 2019-07-15 DIAGNOSIS — E1142 Type 2 diabetes mellitus with diabetic polyneuropathy: Secondary | ICD-10-CM | POA: Diagnosis not present

## 2019-07-15 DIAGNOSIS — M778 Other enthesopathies, not elsewhere classified: Secondary | ICD-10-CM

## 2019-07-15 DIAGNOSIS — S92812A Other fracture of left foot, initial encounter for closed fracture: Secondary | ICD-10-CM

## 2019-07-20 NOTE — Progress Notes (Signed)
Subjective: Cory Marks is a 58 y.o. y.o. male who presents for preventative diabetic foot care on today with chronic preulcerative callus right 5th digit.  Pain is aggravated when wearing enclosed shoe gear and relieved with periodic professional debridement.  Today, he relates pain of left foot, plantar aspect under first metatarsal. He cannot recall any recent trauma, but was told he has a fracture of the sesamoid by another doctor.   Cory Serene, MD is his PCP.   Medications reviewed in chart.  Allergies  Allergen Reactions  . Oxycodone Itching and Palpitations    Objective: There were no vitals filed for this visit.  Vascular Examination: Capillary refill time immediate b/l.  Dorsalis pedis present b/l.  Posterior tibial pulses present b/l.  Digital hair absent b/l.   Skin temperature gradient WNL b/l.  Dermatological Examination: Skin with normal turgor, texture and tone b/l.  Toenails 1-5 left, 3-4 right discolored, thick, dystrophic with subungual debris and pain with palpation to nailbeds due to thickness of nails.  Hyperkeratotic lesion right 5th digit with tenderness to palpation. No edema, no erythema, no drainage, no flocculence.  Musculoskeletal: Muscle strength 5/5 to all LE muscle groups b/l.  Adductovarus deformity right 5th digit.  Status post amputation right hallux and right 2nd digit.  Pain on palpation left tibial sesamoid.  Neurological: Sensation diminished with 10 gram monofilament b/l.  Vibratory sensation diminished b/l.  Xrays b/l feet: No gas in tissues b/l. Plantar calcaneal spur left foot Amputation right hallux, right 2nd digit Tibial sesamoid with transverse fracure   Assessment: Painful onychomycosis toenails 1-5 left, 3-4 right 2.  Callus right 5th digit 3.  Fracture tibial vs tibial sesamoiditis 4.  NIDDM with neuropathy  Plan: 1. Continue diabetic foot care principles. Literature dispensed on  today. 2. Toenails 1-5 b/l were debrided in length and girth without iatrogenic bleeding. 3. Callus pared right 5th digit utilizing sterile scalpel blade without incident.  4. Xrays taken and reviewed b/l feet. 5. Dispensed Darco shoe for left foot. Wear daily until follow up with Dr. Posey Pronto. 6. Patient to continue soft, supportive shoe gear daily. 7. Patient to report any pedal injuries to medical professional immediately. 8. Follow up with Dr. Posey Pronto in 2 weeks. Follow up with me in 9 weeks.  9. Patient/POA to call should there be a concern in the interim.

## 2019-07-29 ENCOUNTER — Ambulatory Visit: Payer: Medicare HMO | Admitting: Podiatry

## 2019-08-07 ENCOUNTER — Ambulatory Visit
Admission: RE | Admit: 2019-08-07 | Discharge: 2019-08-07 | Disposition: A | Discharge status: Home / Self Care | Payer: Medicare HMO | Source: Ambulatory Visit | Attending: Internal Medicine | Admitting: Internal Medicine

## 2019-08-07 ENCOUNTER — Other Ambulatory Visit: Payer: Self-pay | Admitting: Internal Medicine

## 2019-08-07 DIAGNOSIS — R0781 Pleurodynia: Secondary | ICD-10-CM

## 2019-08-14 ENCOUNTER — Other Ambulatory Visit: Payer: Self-pay

## 2019-08-14 ENCOUNTER — Ambulatory Visit (INDEPENDENT_AMBULATORY_CARE_PROVIDER_SITE_OTHER): Payer: Medicare HMO | Admitting: Podiatry

## 2019-08-14 ENCOUNTER — Ambulatory Visit (INDEPENDENT_AMBULATORY_CARE_PROVIDER_SITE_OTHER): Payer: Medicare HMO

## 2019-08-14 DIAGNOSIS — E1142 Type 2 diabetes mellitus with diabetic polyneuropathy: Secondary | ICD-10-CM | POA: Diagnosis not present

## 2019-08-14 DIAGNOSIS — M79672 Pain in left foot: Secondary | ICD-10-CM

## 2019-08-14 DIAGNOSIS — S92812A Other fracture of left foot, initial encounter for closed fracture: Secondary | ICD-10-CM | POA: Diagnosis not present

## 2019-08-14 DIAGNOSIS — M7751 Other enthesopathy of right foot: Secondary | ICD-10-CM | POA: Diagnosis not present

## 2019-08-14 DIAGNOSIS — M779 Enthesopathy, unspecified: Secondary | ICD-10-CM

## 2019-08-14 DIAGNOSIS — M79671 Pain in right foot: Secondary | ICD-10-CM | POA: Diagnosis not present

## 2019-08-16 ENCOUNTER — Encounter: Payer: Self-pay | Admitting: Podiatry

## 2019-08-16 NOTE — Progress Notes (Signed)
Subjective:  Patient ID: Cory Marks, male    DOB: 09-29-1960,  MRN: [id number]  Chief Complaint  Patient presents with   Foot Pain    pt is here for a f/u of the left foot fracture, as well as right foot callus, pt states that the left foot is still feeling pain since the last time he was here    59 y.o. male presents with the above complaint.  Patient presents with follow-up from Dr. Adah Perl for possible left tibial sesamoid fracture pain.  Patient states it is very painful.  He states that the left foot has not gotten any better.  Patient was placed seen by Dr. Adah Perl and was placed in Darco shoe.  He states that the pain is 7 out of 10.  He denies doing anything else for the pain.  He also has a secondary complaint of right fifth digit hyperkeratotic lesion with an associated capsulitis.  Patient states this has been painful the rubs against the shoes all the time causing a very thick callus formation associated with pain.  He denies any other acute complaints.  He has tried shoe gear modification as well as protecting the fifth digit which has not helped.  He would like to know if this could be debrided down.   Review of Systems: Negative except as noted in the HPI. Denies N/V/F/Ch.  Past Medical History:  Diagnosis Date   Arthritis    Bell's palsy    CAD (coronary artery disease)    Cancer ([place]) 2012   Mantle Cell Cancer- S/P stem cell transplant   CKD (chronic kidney disease) stage 3, GFR 30-59 ml/min    per lov with pcp,  last seen at [facility] january 2020 per patient, stable per patient    Diabetes mellitus without complication ([place])    hx of lost weight due to cancer not on medications since 2014    Dysrhythmia    atrial fib and atrial flutter, no repeat of dysrhymia since ablation in 2018 ,    Eye injury    left eye hit by a wood chip early june 2020, lov for f/u with opthamologist 6-17 , drained a pustule,  progress notes mention eye gradually improving  with application of antibiotic  eye drops    History of chemotherapy last tx 2014   History of diabetes mellitus NONE SINCE 2012 CANCER   History of splenectomy    Hyperlipemia    Hypertension    Neuromuscular disorder ([place])    peripheral neuropathy   Neuropathy    right leg due to football injury   Polysubstance abuse ([facility])    Sinus bradycardia     Current Outpatient Medications:    amitriptyline (ELAVIL) 50 MG tablet, TK 1 T PO QD HS, Disp: , Rfl:    amLODipine (NORVASC) 10 MG tablet, Take 1 tablet (10 mg total) by mouth every morning., Disp: 90 tablet, Rfl: 1   atorvastatin (LIPITOR) 10 MG tablet, , Disp: , Rfl:    atorvastatin (LIPITOR) 40 MG tablet, Take 1 tablet (40 mg total) by mouth daily., Disp: 90 tablet, Rfl: 1   Cholecalciferol (VITAMIN D) 50 MCG (2000 UT) tablet, Take 2,000 Units by mouth daily., Disp: , Rfl:    cyclobenzaprine (FLEXERIL) 10 MG tablet, Take 1 tablet (10 mg total) by mouth 3 (three) times daily as needed for muscle spasms., Disp: 30 tablet, Rfl: 1   diclofenac sodium (VOLTAREN) 1 % GEL, Apply 1 application topically 4 (four) times daily  as needed (pain). , Disp: , Rfl:    diclofenac Sodium (VOLTAREN) 1 % GEL, , Disp: , Rfl:    diphenhydrAMINE (BENADRYL) 25 mg capsule, Take 1 capsule (25 mg total) by mouth every 6 (six) hours as needed for itching., Disp: 30 capsule, Rfl: 0   gabapentin (NEURONTIN) 300 MG capsule, , Disp: , Rfl:    gabapentin (NEURONTIN) 600 MG tablet, Take 1 tablet (600 mg total) by mouth 3 (three) times daily., Disp: 270 tablet, Rfl: 2   HYDROmorphone (DILAUDID) 2 MG tablet, Take 1 tablet (2 mg total) by mouth every 4 (four) hours as needed., Disp: 20 tablet, Rfl: 0   lisinopril (PRINIVIL,ZESTRIL) 20 MG tablet, TAKE 1 TABLET EVERY DAY (Patient taking differently: Take 20 mg by mouth daily. ), Disp: 90 tablet, Rfl: 3   lisinopril (ZESTRIL) 5 MG tablet, , Disp: , Rfl:    morphine (MSIR) 15 MG tablet, Take 15 mg by mouth  2 (two) times daily as needed for pain., Disp: , Rfl:    ofloxacin (OCUFLOX) 0.3 % ophthalmic solution, Place 1 drop into the left eye 3 times daily., Disp: , Rfl:    penicillin v potassium (VEETID) 500 MG tablet, Take 500 mg by mouth 2 (two) times daily. , Disp: , Rfl:    pyridOXINE (VITAMIN B-6) 100 MG tablet, Take 100 mg by mouth daily., Disp: , Rfl:    sildenafil (VIAGRA) 25 MG tablet, Take 25 mg by mouth daily as needed for erectile dysfunction., Disp: , Rfl:    temazepam (RESTORIL) 30 MG capsule, TK 1 C PO QHS PRF SLP, Disp: , Rfl:    traMADol (ULTRAM) 50 MG tablet, Take by mouth., Disp: , Rfl:   Social History   Tobacco Use  Smoking Status Never Smoker  Smokeless Tobacco Never Used    Allergies  Allergen Reactions   Oxycodone Itching and Palpitations   Objective:  There were no vitals filed for this visit. There is no height or weight on file to calculate BMI. Constitutional Well developed. Well nourished.  Vascular Dorsalis pedis pulses palpable bilaterally. Posterior tibial pulses palpable bilaterally. Capillary refill normal to all digits.  No cyanosis or clubbing noted. Pedal hair growth normal.  Neurologic Normal speech. Oriented to person, place, and time. Epicritic sensation to light touch grossly present bilaterally.  Dermatologic Nails well groomed and normal in appearance. No open wounds. No skin lesions.  Orthopedic:  Pain on palpation to the left plantar aspect of the first metatarsophalangeal joint.  Mild pain with range of motion of first metatarsophalangeal joint active and passive.  No intra-articular pain at the first MPJ.  Negative pain with resisted extensor or flexor range of motion.  Pain on palpation to the right fifth digit lateral portion.  There is a hyperkeratotic lesion noted across the fifth digit.  There is an adductovarus component to the fifth digit noted.   Radiographs: 2 views of skeletally mature adult left foot.  Radiolucency  noted across the tibial sesamoid at the site of maximal tenderness.  This could likely be a bipartite sesamoid versus fracture.  Given that there is no jagged edges present I believe that this is likely due to bipartite sesamoid as opposed to fracture. Assessment:   1. Closed fracture of sesamoid bone of left foot, initial encounter    Plan:  Patient was evaluated and treated and all questions answered.  Left tibial sesamoid fracture versus bipartite sesamoid -I believe that this is likely due to bipartite sesamoid.  I explained  to the patient the etiology of bipartite sesamoid versus tibial fracture and various treatment options associated with it.  I believe patient will benefit from steroid injection to help decrease some of the inflammation that patient may be experiencing.  I believe patient will also benefit from a Darco shoe that he has already been wearing.  I instructed him to continue wearing that. -Patient agrees with the plan and would like to proceed with a steroid injection. -A steroid injection was performed at left first metatarsophalangeal joint using 1% plain Lidocaine and 10 mg of Kenalog. This was well tolerated.  Right fifth digit hyperkeratotic lesion/capsulitis -I explained to the patient the etiology of capsulitis and various treatment options associated with it.  Given the amount of pain that he is experiencing I believe patient will benefit from a steroid injection to help decrease inflammation from the constant pressure that he is experiencing from shoe gear.  This will be followed by aggressive debridement of the hyperkeratotic lesion.  Patient agrees with the plan and would like to proceed with the injection followed by debridement. -A steroid injection was performed at right fifth digit at the point of maximal tenderness using 1% plain Lidocaine and 10 mg of Kenalog. This was well tolerated.  Return in about 4 weeks (around 09/11/2019).

## 2019-09-11 ENCOUNTER — Ambulatory Visit: Payer: Medicare HMO | Admitting: Podiatry

## 2019-09-27 ENCOUNTER — Other Ambulatory Visit: Payer: Self-pay | Admitting: Podiatry

## 2019-09-27 ENCOUNTER — Ambulatory Visit (INDEPENDENT_AMBULATORY_CARE_PROVIDER_SITE_OTHER): Payer: Medicare HMO

## 2019-09-27 ENCOUNTER — Other Ambulatory Visit: Payer: Self-pay

## 2019-09-27 ENCOUNTER — Ambulatory Visit: Payer: Medicare HMO | Admitting: Podiatry

## 2019-09-27 DIAGNOSIS — M79674 Pain in right toe(s): Secondary | ICD-10-CM | POA: Diagnosis not present

## 2019-09-27 DIAGNOSIS — M779 Enthesopathy, unspecified: Secondary | ICD-10-CM | POA: Diagnosis not present

## 2019-09-27 DIAGNOSIS — M79675 Pain in left toe(s): Secondary | ICD-10-CM | POA: Diagnosis not present

## 2019-09-27 DIAGNOSIS — E1142 Type 2 diabetes mellitus with diabetic polyneuropathy: Secondary | ICD-10-CM

## 2019-09-27 DIAGNOSIS — S92812A Other fracture of left foot, initial encounter for closed fracture: Secondary | ICD-10-CM | POA: Diagnosis not present

## 2019-09-27 DIAGNOSIS — M778 Other enthesopathies, not elsewhere classified: Secondary | ICD-10-CM | POA: Diagnosis not present

## 2019-09-27 DIAGNOSIS — B351 Tinea unguium: Secondary | ICD-10-CM | POA: Diagnosis not present

## 2019-09-27 DIAGNOSIS — M2580 Other specified joint disorders, unspecified joint: Secondary | ICD-10-CM

## 2019-10-01 ENCOUNTER — Encounter: Payer: Self-pay | Admitting: Podiatry

## 2019-10-01 NOTE — Progress Notes (Signed)
Subjective:  Patient ID: BASILE CDEBACA, male    DOB: 29-Apr-1961,  MRN: [id number]  Chief Complaint  Patient presents with  . Foot Injury    pt is here for a f/u on the left foot sesamoif fracture f/u, pt states that his left foot is feeling a lot better, the injection he recieved last time has helped tremendously, pt is also concerned with the right foot hurting as well, pt states that he has a hard time walking on it.    59 y.o. male presents with the above complaint.  Patient presents with a follow-up for possible left tibial sesamoiditis as well as right fifth digit corn/rotation of the toe.  Patient states injection that he was given last time has considerably helped.  He would like to know if there is more injections I be given to the area.  He is getting significant relief from the injection.  He still has some pain associated with it but he is at greater than 50% improvement to with both of the injections.  He also has a secondary complaint of elongated thickened mycotic toenails that has causing him some pain especially while ambulating.  He is not able to debride them himself anymore.  He would like to know if this could be debrided for [place].  He denies any other acute complaints.   Review of Systems: Negative except as noted in the HPI. Denies N/V/F/Ch.  Past Medical History:  Diagnosis Date  . Arthritis   . Bell's palsy   . CAD (coronary artery disease)   . Cancer [facility]) 2012   Mantle Cell Cancer- S/P stem cell transplant  . CKD (chronic kidney disease) stage 3, GFR 30-59 ml/min    per lov with pcp,  last seen at [facility] january 2020 per patient, stable per patient   . Diabetes mellitus without complication (HCC)    hx of lost weight due to cancer not on medications since 2014   . Dysrhythmia    atrial fib and atrial flutter, no repeat of dysrhymia since ablation in 2018 ,   . Eye injury    left eye hit by a wood chip early june 2020, lov for f/u with opthamologist 6-17  , drained a pustule,  progress notes mention eye gradually improving with application of antibiotic  eye drops   . History of chemotherapy last tx 2014  . History of diabetes mellitus NONE SINCE 2012 CANCER  . History of splenectomy   . Hyperlipemia   . Hypertension   . Neuromuscular disorder ([place])    peripheral neuropathy  . Neuropathy    right leg due to football injury  . Polysubstance abuse ([place])   . Sinus bradycardia     Current Outpatient Medications:  .  amitriptyline (ELAVIL) 50 MG tablet, TK 1 T PO QD HS, Disp: , Rfl:  .  amLODipine (NORVASC) 10 MG tablet, Take 1 tablet (10 mg total) by mouth every morning., Disp: 90 tablet, Rfl: 1 .  atorvastatin (LIPITOR) 10 MG tablet, , Disp: , Rfl:  .  atorvastatin (LIPITOR) 40 MG tablet, Take 1 tablet (40 mg total) by mouth daily., Disp: 90 tablet, Rfl: 1 .  Cholecalciferol (VITAMIN D) 50 MCG (2000 UT) tablet, Take 2,000 Units by mouth daily., Disp: , Rfl:  .  cyclobenzaprine (FLEXERIL) 10 MG tablet, Take 1 tablet (10 mg total) by mouth 3 (three) times daily as needed for muscle spasms., Disp: 30 tablet, Rfl: 1 .  diclofenac sodium (VOLTAREN) 1 %  GEL, Apply 1 application topically 4 (four) times daily as needed (pain). , Disp: , Rfl:  .  diclofenac Sodium (VOLTAREN) 1 % GEL, , Disp: , Rfl:  .  diphenhydrAMINE (BENADRYL) 25 mg capsule, Take 1 capsule (25 mg total) by mouth every 6 (six) hours as needed for itching., Disp: 30 capsule, Rfl: 0 .  gabapentin (NEURONTIN) 300 MG capsule, , Disp: , Rfl:  .  gabapentin (NEURONTIN) 600 MG tablet, Take 1 tablet (600 mg total) by mouth 3 (three) times daily., Disp: 270 tablet, Rfl: 2 .  HYDROmorphone (DILAUDID) 2 MG tablet, Take 1 tablet (2 mg total) by mouth every 4 (four) hours as needed., Disp: 20 tablet, Rfl: 0 .  lisinopril (PRINIVIL,ZESTRIL) 20 MG tablet, TAKE 1 TABLET EVERY DAY (Patient taking differently: Take 20 mg by mouth daily. ), Disp: 90 tablet, Rfl: 3 .  lisinopril (ZESTRIL) 5 MG tablet,  , Disp: , Rfl:  .  morphine (MSIR) 15 MG tablet, Take 15 mg by mouth 2 (two) times daily as needed for pain., Disp: , Rfl:  .  ofloxacin (OCUFLOX) 0.3 % ophthalmic solution, Place 1 drop into the left eye 3 times daily., Disp: , Rfl:  .  penicillin v potassium (VEETID) 500 MG tablet, Take 500 mg by mouth 2 (two) times daily. , Disp: , Rfl:  .  pyridOXINE (VITAMIN B-6) 100 MG tablet, Take 100 mg by mouth daily., Disp: , Rfl:  .  sildenafil (VIAGRA) 25 MG tablet, Take 25 mg by mouth daily as needed for erectile dysfunction., Disp: , Rfl:  .  temazepam (RESTORIL) 30 MG capsule, TK 1 C PO QHS PRF SLP, Disp: , Rfl:  .  traMADol (ULTRAM) 50 MG tablet, Take by mouth., Disp: , Rfl:   Social History   Tobacco Use  Smoking Status Never Smoker  Smokeless Tobacco Never Used    Allergies  Allergen Reactions  . Oxycodone Itching and Palpitations   Objective:  There were no vitals filed for this visit. There is no height or weight on file to calculate BMI. Constitutional Well developed. Well nourished.  Vascular Dorsalis pedis pulses palpable bilaterally. Posterior tibial pulses palpable bilaterally. Capillary refill normal to all digits.  No cyanosis or clubbing noted. Pedal hair growth normal.  Neurologic Normal speech. Oriented to person, place, and time. Epicritic sensation to light touch grossly present bilaterally.  Dermatologic  thickened elongated mycotic dystrophic toenails x10.  These are painful in nature. No open wounds. No skin lesions.    Orthopedic:  Pain on palpation to the left plantar aspect of the first metatarsophalangeal joint.  Mild pain with range of motion of first metatarsophalangeal joint active and passive.  No intra-articular pain at the first MPJ.  Negative pain with resisted extensor or flexor range of motion.  Pain on palpation to the right fifth digit lateral portion.  There is a hyperkeratotic lesion noted across the fifth digit.  There is an adductovarus  component to the fifth digit noted.   Radiographs: 2 views of skeletally mature adult left foot.  Radiolucency noted across the tibial sesamoid at the site of maximal tenderness.  This could likely be a bipartite sesamoid versus fracture.  Given that there is no jagged edges present I believe that this is likely due to bipartite sesamoid as opposed to fracture.  No improvement after comparing both of the radiographs previously taken.  I believe this is likely a bipartite sesamoid as opposed to a fracture Assessment:   1. Closed fracture of  sesamoid bone of left foot, initial encounter   2. Diabetic peripheral neuropathy associated with type 2 diabetes mellitus (HCC)   3. Pain due to onychomycosis of toenails of both feet   4. Sesamoiditis   5. Capsulitis    Plan:  Patient was evaluated and treated and all questions answered.  Left tibial bipartite sesamoiditis~no radiographical changes therefore less likely fracture -Clinically patient had significant improvement greater than 50% with a steroid injection.  I will perform another steroid injection to help achieve closer to 100%.  Patient agrees with the plan would like to proceed with injection -Patient agrees with the plan and would like to proceed with a steroid injection. -A steroid injection was performed at left first metatarsophalangeal joint using 1% plain Lidocaine and 10 mg of Kenalog. This was well tolerated.  Right fifth digit hyperkeratotic lesion/capsulitis -Patient has considerable improvement to the fifth digit after steroid injection.  I believe he will benefit from another steroid injection followed by aggressive debridement.  This will be followed by aggressive debridement of the hyperkeratotic lesion.  Patient agrees with the plan and would like to proceed with the injection followed by debridement. -A steroid injection was performed at right fifth digit at the point of maximal tenderness using 1% plain Lidocaine and 10 mg of  Kenalog. This was well tolerated.  Onychomycosis with pain  -Nails palliatively debrided as below. -Educated on self-care  Procedure: Nail Debridement Rationale: pain  Type of Debridement: manual, sharp debridement. Instrumentation: Nail nipper, rotary burr. Number of Nails: 10  Procedures and Treatment: Consent by patient was obtained for treatment procedures. The patient understood the discussion of treatment and procedures well. All questions were answered thoroughly reviewed. Debridement of mycotic and hypertrophic toenails, 1 through 5 bilateral and clearing of subungual debris. No ulceration, no infection noted.  Return Visit-Office Procedure: Patient instructed to return to the office for a follow up visit 3 months for continued evaluation and treatment.  Boneta Lucks, DPM    No follow-ups on file.   No follow-ups on file.

## 2019-10-14 ENCOUNTER — Ambulatory Visit: Payer: Medicare HMO | Admitting: Podiatry

## 2019-11-13 ENCOUNTER — Other Ambulatory Visit: Payer: Self-pay

## 2019-11-13 ENCOUNTER — Ambulatory Visit: Payer: Medicare HMO | Admitting: Podiatry

## 2019-11-13 DIAGNOSIS — E1149 Type 2 diabetes mellitus with other diabetic neurological complication: Secondary | ICD-10-CM | POA: Diagnosis not present

## 2019-11-13 DIAGNOSIS — Z89411 Acquired absence of right great toe: Secondary | ICD-10-CM | POA: Diagnosis not present

## 2019-11-13 DIAGNOSIS — M779 Enthesopathy, unspecified: Secondary | ICD-10-CM

## 2019-11-13 DIAGNOSIS — M79675 Pain in left toe(s): Secondary | ICD-10-CM | POA: Diagnosis not present

## 2019-11-13 DIAGNOSIS — L84 Corns and callosities: Secondary | ICD-10-CM | POA: Diagnosis not present

## 2019-11-13 DIAGNOSIS — B351 Tinea unguium: Secondary | ICD-10-CM | POA: Diagnosis not present

## 2019-11-13 DIAGNOSIS — L853 Xerosis cutis: Secondary | ICD-10-CM | POA: Diagnosis not present

## 2019-11-13 DIAGNOSIS — M79674 Pain in right toe(s): Secondary | ICD-10-CM | POA: Diagnosis not present

## 2019-11-13 DIAGNOSIS — Z89421 Acquired absence of other right toe(s): Secondary | ICD-10-CM

## 2019-11-13 MED ORDER — AMMONIUM LACTATE 12 % EX LOTN: 1.0000 "application " | TOPICAL_LOTION | CUTANEOUS | 0 refills | Status: DC | PRN

## 2019-11-14 ENCOUNTER — Encounter: Payer: Self-pay | Admitting: Podiatry

## 2019-11-14 NOTE — Progress Notes (Signed)
Subjective:  Patient ID: Cory Marks, male    DOB: 1961-06-09,  MRN: [id number]  Chief Complaint  Patient presents with  . Diabetes Mellitus    Diabetic shoes pick up  . Nail Problem    Nail trim  . Callouses    Callous trim    59 y.o. male presents with the above complaint.  Patient presents with a follow-up of possible left tibial sesamoiditis as well as right fifth digit corn/rotation of the toe.  Patient states that sesamoiditis has completely resolved he does not have pain anymore.  He is able to ambulate without any problems.  Patient is also here today to pick up his diabetic shoes as well.  He states that he still has pain on the right fifth toe likely due to a lot of pressure as well as how severely rotated the toe S.  He states the injection lasted for a little bit but did not give him complete relief.  He stated is helping a little bit.  He denies doing anything else for the pressure.  He also has tertiary complaint of xerosis and how dry it both of his scans are.  He would like to know if there is something that could be done for this.  He has not tried anything besides soaking his feet.  He applies Vaseline.  I told him to apply over-the-counter lotion twice a day and start applying Vaseline.  Patient states understanding he denies any other acute complaints.   Review of Systems: Negative except as noted in the HPI. Denies N/V/F/Ch.  Past Medical History:  Diagnosis Date  . Arthritis   . Bell's palsy   . CAD (coronary artery disease)   . Cancer [facility]) 2012   Mantle Cell Cancer- S/P stem cell transplant  . CKD (chronic kidney disease) stage 3, GFR 30-59 ml/min    per lov with pcp,  last seen at [facility] january 2020 per patient, stable per patient   . Diabetes mellitus without complication (HCC)    hx of lost weight due to cancer not on medications since 2014   . Dysrhythmia    atrial fib and atrial flutter, no repeat of dysrhymia since ablation in 2018 ,   . Eye  injury    left eye hit by a wood chip early june 2020, lov for f/u with opthamologist 6-17 , drained a pustule,  progress notes mention eye gradually improving with application of antibiotic  eye drops   . History of chemotherapy last tx 2014  . History of diabetes mellitus NONE SINCE 2012 CANCER  . History of splenectomy   . Hyperlipemia   . Hypertension   . Neuromuscular disorder ([place])    peripheral neuropathy  . Neuropathy    right leg due to football injury  . Polysubstance abuse ([place])   . Sinus bradycardia     Current Outpatient Medications:  .  amitriptyline (ELAVIL) 50 MG tablet, TK 1 T PO QD HS, Disp: , Rfl:  .  amLODipine (NORVASC) 10 MG tablet, Take 1 tablet (10 mg total) by mouth every morning., Disp: 90 tablet, Rfl: 1 .  atorvastatin (LIPITOR) 10 MG tablet, , Disp: , Rfl:  .  atorvastatin (LIPITOR) 40 MG tablet, Take 1 tablet (40 mg total) by mouth daily., Disp: 90 tablet, Rfl: 1 .  Cholecalciferol (VITAMIN D) 50 MCG (2000 UT) tablet, Take 2,000 Units by mouth daily., Disp: , Rfl:  .  cyclobenzaprine (FLEXERIL) 10 MG tablet, Take 1 tablet (10  mg total) by mouth 3 (three) times daily as needed for muscle spasms., Disp: 30 tablet, Rfl: 1 .  diclofenac sodium (VOLTAREN) 1 % GEL, Apply 1 application topically 4 (four) times daily as needed (pain). , Disp: , Rfl:  .  diclofenac Sodium (VOLTAREN) 1 % GEL, , Disp: , Rfl:  .  diphenhydrAMINE (BENADRYL) 25 mg capsule, Take 1 capsule (25 mg total) by mouth every 6 (six) hours as needed for itching., Disp: 30 capsule, Rfl: 0 .  gabapentin (NEURONTIN) 300 MG capsule, , Disp: , Rfl:  .  gabapentin (NEURONTIN) 600 MG tablet, Take 1 tablet (600 mg total) by mouth 3 (three) times daily., Disp: 270 tablet, Rfl: 2 .  HYDROmorphone (DILAUDID) 2 MG tablet, Take 1 tablet (2 mg total) by mouth every 4 (four) hours as needed., Disp: 20 tablet, Rfl: 0 .  lisinopril (PRINIVIL,ZESTRIL) 20 MG tablet, TAKE 1 TABLET EVERY DAY (Patient taking differently:  Take 20 mg by mouth daily. ), Disp: 90 tablet, Rfl: 3 .  lisinopril (ZESTRIL) 5 MG tablet, , Disp: , Rfl:  .  morphine (MSIR) 15 MG tablet, Take 15 mg by mouth 2 (two) times daily as needed for pain., Disp: , Rfl:  .  ofloxacin (OCUFLOX) 0.3 % ophthalmic solution, Place 1 drop into the left eye 3 times daily., Disp: , Rfl:  .  penicillin v potassium (VEETID) 500 MG tablet, Take 500 mg by mouth 2 (two) times daily. , Disp: , Rfl:  .  pyridOXINE (VITAMIN B-6) 100 MG tablet, Take 100 mg by mouth daily., Disp: , Rfl:  .  sildenafil (VIAGRA) 25 MG tablet, Take 25 mg by mouth daily as needed for erectile dysfunction., Disp: , Rfl:  .  temazepam (RESTORIL) 30 MG capsule, TK 1 C PO QHS PRF SLP, Disp: , Rfl:  .  timolol (TIMOPTIC) 0.5 % ophthalmic solution, , Disp: , Rfl:  .  traMADol (ULTRAM) 50 MG tablet, Take by mouth., Disp: , Rfl:  .  valACYclovir (VALTREX) 1000 MG tablet, , Disp: , Rfl:  .  ammonium lactate (AMLACTIN) 12 % lotion, Apply 1 application topically as needed for dry skin., Disp: 400 g, Rfl: 0  Social History   Tobacco Use  Smoking Status Never Smoker  Smokeless Tobacco Never Used    Allergies  Allergen Reactions  . Oxycodone Itching and Palpitations   Objective:  There were no vitals filed for this visit. There is no height or weight on file to calculate BMI. Constitutional Well developed. Well nourished.  Vascular Dorsalis pedis pulses palpable bilaterally. Posterior tibial pulses palpable bilaterally. Capillary refill normal to all digits.  No cyanosis or clubbing noted. Pedal hair growth normal.  Neurologic Normal speech. Oriented to person, place, and time. Epicritic sensation to light touch grossly present bilaterally.  Dermatologic  thickened elongated mycotic dystrophic toenails x 7.  These are painful in nature. No open wounds. No skin lesions.    Orthopedic:  Pain on palpation to the left plantar aspect of the first metatarsophalangeal joint.  Mild pain  with range of motion of first metatarsophalangeal joint active and passive.  No intra-articular pain at the first MPJ.  Negative pain with resisted extensor or flexor range of motion.  Pain on palpation to the right fifth digit lateral portion.  There is a hyperkeratotic lesion noted across the fifth digit.  There is an adductovarus component to the fifth digit noted.  The rotation appears to be very severe in nature.  History of amputation of right  first and second digit.   Radiographs: None Assessment:   No diagnosis found. Plan:  Patient was evaluated and treated and all questions answered.  Left tibial bipartite sesamoiditis~no radiographical changes therefore less likely fracture -Clinically resolved.  Upon palpation there is no further pain..  Right fifth digit hyperkeratotic lesion/capsulitis~slowly improving -Patient has considerable improvement to the fifth digit after steroid injection.  I believe he will benefit from another steroid injection followed by aggressive debridement.  This will be followed by aggressive debridement of the hyperkeratotic lesion.  Patient agrees with the plan and would like to proceed with the injection followed by debridement.  Second injection -A steroid injection was performed at right fifth digit at the point of maximal tenderness using 1% plain Lidocaine and 10 mg of Kenalog. This was well tolerated.  Xerosis -I explained to the patient the etiology of xerosis and various treatment options were extensively discussed with the patient.  I discussed with the patient that given that he is at severe dry of xerosis to bilateral plantar extremity I believe he will benefit from prescription lotion.  AmLactin lotion was dispensed.  Onychomycosis with pain  -Nails palliatively debrided as below. -Educated on self-care  Procedure: Nail Debridement Rationale: pain  Type of Debridement: manual, sharp debridement. Instrumentation: Nail nipper, rotary  burr. Number of Nails: 7  Procedures and Treatment: Consent by patient was obtained for treatment procedures. The patient understood the discussion of treatment and procedures well. All questions were answered thoroughly reviewed. Debridement of mycotic and hypertrophic toenails, 1 through 5 bilateral and clearing of subungual debris. No ulceration, no infection noted.  Return Visit-Office Procedure: Patient instructed to return to the office for a follow up visit 3 months for continued evaluation and treatment.  Boneta Lucks, DPM    No follow-ups on file.   No follow-ups on file.

## 2019-12-19 ENCOUNTER — Other Ambulatory Visit: Payer: Self-pay

## 2019-12-19 ENCOUNTER — Encounter (HOSPITAL_COMMUNITY): Payer: Self-pay | Admitting: Emergency Medicine

## 2019-12-19 ENCOUNTER — Emergency Department (HOSPITAL_COMMUNITY): Payer: Medicare HMO

## 2019-12-19 ENCOUNTER — Other Ambulatory Visit: Payer: Self-pay | Admitting: Physician Assistant

## 2019-12-19 ENCOUNTER — Inpatient Hospital Stay (HOSPITAL_COMMUNITY)
Admission: EM | Admit: 2019-12-19 | Discharge: 2019-12-22 | DRG: 617 | Disposition: A | Discharge status: Home Health | Payer: Medicare HMO | Attending: Internal Medicine | Admitting: Internal Medicine

## 2019-12-19 DIAGNOSIS — G620 Drug-induced polyneuropathy: Secondary | ICD-10-CM | POA: Diagnosis present

## 2019-12-19 DIAGNOSIS — L02611 Cutaneous abscess of right foot: Secondary | ICD-10-CM | POA: Diagnosis present

## 2019-12-19 DIAGNOSIS — Z96652 Presence of left artificial knee joint: Secondary | ICD-10-CM | POA: Diagnosis present

## 2019-12-19 DIAGNOSIS — N1831 Chronic kidney disease, stage 3a: Secondary | ICD-10-CM | POA: Diagnosis present

## 2019-12-19 DIAGNOSIS — I2510 Atherosclerotic heart disease of native coronary artery without angina pectoris: Secondary | ICD-10-CM | POA: Diagnosis present

## 2019-12-19 DIAGNOSIS — T451X5A Adverse effect of antineoplastic and immunosuppressive drugs, initial encounter: Secondary | ICD-10-CM | POA: Diagnosis present

## 2019-12-19 DIAGNOSIS — Z833 Family history of diabetes mellitus: Secondary | ICD-10-CM | POA: Diagnosis not present

## 2019-12-19 DIAGNOSIS — M869 Osteomyelitis, unspecified: Secondary | ICD-10-CM | POA: Diagnosis present

## 2019-12-19 DIAGNOSIS — C8597 Non-Hodgkin lymphoma, unspecified, spleen: Secondary | ICD-10-CM | POA: Diagnosis present

## 2019-12-19 DIAGNOSIS — I10 Essential (primary) hypertension: Secondary | ICD-10-CM | POA: Diagnosis not present

## 2019-12-19 DIAGNOSIS — Z9221 Personal history of antineoplastic chemotherapy: Secondary | ICD-10-CM

## 2019-12-19 DIAGNOSIS — Z800 Family history of malignant neoplasm of digestive organs: Secondary | ICD-10-CM | POA: Diagnosis not present

## 2019-12-19 DIAGNOSIS — L97519 Non-pressure chronic ulcer of other part of right foot with unspecified severity: Secondary | ICD-10-CM | POA: Diagnosis present

## 2019-12-19 DIAGNOSIS — M86271 Subacute osteomyelitis, right ankle and foot: Secondary | ICD-10-CM | POA: Diagnosis present

## 2019-12-19 DIAGNOSIS — Z96611 Presence of right artificial shoulder joint: Secondary | ICD-10-CM | POA: Diagnosis present

## 2019-12-19 DIAGNOSIS — L97514 Non-pressure chronic ulcer of other part of right foot with necrosis of bone: Secondary | ICD-10-CM | POA: Diagnosis not present

## 2019-12-19 DIAGNOSIS — C8317 Mantle cell lymphoma, spleen: Secondary | ICD-10-CM | POA: Diagnosis not present

## 2019-12-19 DIAGNOSIS — Z792 Long term (current) use of antibiotics: Secondary | ICD-10-CM | POA: Diagnosis not present

## 2019-12-19 DIAGNOSIS — E1122 Type 2 diabetes mellitus with diabetic chronic kidney disease: Secondary | ICD-10-CM | POA: Diagnosis present

## 2019-12-19 DIAGNOSIS — C8590 Non-Hodgkin lymphoma, unspecified, unspecified site: Secondary | ICD-10-CM | POA: Diagnosis present

## 2019-12-19 DIAGNOSIS — Z79899 Other long term (current) drug therapy: Secondary | ICD-10-CM

## 2019-12-19 DIAGNOSIS — Z8249 Family history of ischemic heart disease and other diseases of the circulatory system: Secondary | ICD-10-CM

## 2019-12-19 DIAGNOSIS — I129 Hypertensive chronic kidney disease with stage 1 through stage 4 chronic kidney disease, or unspecified chronic kidney disease: Secondary | ICD-10-CM | POA: Diagnosis present

## 2019-12-19 DIAGNOSIS — N183 Chronic kidney disease, stage 3 unspecified: Secondary | ICD-10-CM | POA: Diagnosis present

## 2019-12-19 DIAGNOSIS — Z79891 Long term (current) use of opiate analgesic: Secondary | ICD-10-CM | POA: Diagnosis not present

## 2019-12-19 DIAGNOSIS — E1140 Type 2 diabetes mellitus with diabetic neuropathy, unspecified: Secondary | ICD-10-CM | POA: Diagnosis present

## 2019-12-19 DIAGNOSIS — E1169 Type 2 diabetes mellitus with other specified complication: Secondary | ICD-10-CM | POA: Diagnosis present

## 2019-12-19 DIAGNOSIS — E11621 Type 2 diabetes mellitus with foot ulcer: Principal | ICD-10-CM | POA: Diagnosis present

## 2019-12-19 DIAGNOSIS — E785 Hyperlipidemia, unspecified: Secondary | ICD-10-CM | POA: Diagnosis present

## 2019-12-19 DIAGNOSIS — Z9081 Acquired absence of spleen: Secondary | ICD-10-CM

## 2019-12-19 DIAGNOSIS — Z20822 Contact with and (suspected) exposure to covid-19: Secondary | ICD-10-CM | POA: Diagnosis present

## 2019-12-19 LAB — URINALYSIS, ROUTINE W REFLEX MICROSCOPIC
Bilirubin Urine: NEGATIVE
Glucose, UA: NEGATIVE mg/dL
Hgb urine dipstick: NEGATIVE
Ketones, ur: NEGATIVE mg/dL
Leukocytes,Ua: NEGATIVE
Nitrite: NEGATIVE
Protein, ur: NEGATIVE mg/dL
Specific Gravity, Urine: 1.018 (ref 1.005–1.030)
pH: 6 (ref 5.0–8.0)

## 2019-12-19 LAB — COMPREHENSIVE METABOLIC PANEL
ALT: 15 U/L (ref 0–44)
AST: 15 U/L (ref 15–41)
Albumin: 3.7 g/dL (ref 3.5–5.0)
Alkaline Phosphatase: 42 U/L (ref 38–126)
Anion gap: 10 (ref 5–15)
BUN: 21 mg/dL — ABNORMAL HIGH (ref 6–20)
CO2: 26 mmol/L (ref 22–32)
Calcium: 9.3 mg/dL (ref 8.9–10.3)
Chloride: 101 mmol/L (ref 98–111)
Creatinine, Ser: 1.4 mg/dL — ABNORMAL HIGH (ref 0.61–1.24)
GFR calc Af Amer: >60 mL/min (ref >60)
GFR calc non Af Amer: 55 mL/min — ABNORMAL LOW (ref >60)
Glucose, Bld: 102 mg/dL — ABNORMAL HIGH (ref 70–99)
Potassium: 4 mmol/L (ref 3.5–5.1)
Sodium: 137 mmol/L (ref 135–145)
Total Bilirubin: 0.8 mg/dL (ref 0.3–1.2)
Total Protein: 6.3 g/dL — ABNORMAL LOW (ref 6.5–8.1)

## 2019-12-19 LAB — CBC WITH DIFFERENTIAL/PLATELET
Abs Immature Granulocytes: 0.05 10*3/uL (ref 0.00–0.07)
Basophils Absolute: 0.1 10*3/uL (ref 0.0–0.1)
Basophils Relative: 1 %
Eosinophils Absolute: 0.1 10*3/uL (ref 0.0–0.5)
Eosinophils Relative: 1 %
HCT: 43.2 % (ref 39.0–52.0)
Hemoglobin: 14.5 g/dL (ref 13.0–17.0)
Immature Granulocytes: 1 %
Lymphocytes Relative: 27 %
Lymphs Abs: 2.3 10*3/uL (ref 0.7–4.0)
MCH: 32.9 pg (ref 26.0–34.0)
MCHC: 33.6 g/dL (ref 30.0–36.0)
MCV: 98 fL (ref 80.0–100.0)
Monocytes Absolute: 0.7 10*3/uL (ref 0.1–1.0)
Monocytes Relative: 9 %
Neutro Abs: 5.3 10*3/uL (ref 1.7–7.7)
Neutrophils Relative %: 61 %
Platelets: 262 10*3/uL (ref 150–400)
RBC: 4.41 MIL/uL (ref 4.22–5.81)
RDW: 13 % (ref 11.5–15.5)
WBC: 8.4 10*3/uL (ref 4.0–10.5)
nRBC: 0 % (ref 0.0–0.2)

## 2019-12-19 LAB — SURGICAL PCR SCREEN
MRSA, PCR: POSITIVE — AB
Staphylococcus aureus: POSITIVE — AB

## 2019-12-19 LAB — HIV ANTIBODY (ROUTINE TESTING W REFLEX): HIV Screen 4th Generation wRfx: NONREACTIVE

## 2019-12-19 LAB — LACTIC ACID, PLASMA
Lactic Acid, Venous: 1 mmol/L (ref 0.5–1.9)
Lactic Acid, Venous: 0.8 mmol/L (ref 0.5–1.9)

## 2019-12-19 LAB — SARS CORONAVIRUS 2 BY RT PCR (HOSPITAL ORDER, PERFORMED IN ~~LOC~~ HOSPITAL LAB): SARS Coronavirus 2: NEGATIVE

## 2019-12-19 LAB — SEDIMENTATION RATE: Sed Rate: 8 mm/hr (ref 0–16)

## 2019-12-19 MED ORDER — ENOXAPARIN SODIUM 40 MG/0.4ML ~~LOC~~ SOLN
40.0000 mg | SUBCUTANEOUS | Status: DC
  Administered 2019-12-19 – 2019-12-22 (×3): 40 mg via SUBCUTANEOUS
  Filled 2019-12-19 (×3): qty 0.4

## 2019-12-19 MED ORDER — VITAMIN B-6 100 MG PO TABS
100.0000 mg | ORAL_TABLET | Freq: Every day | ORAL | Status: DC
  Administered 2019-12-19 – 2019-12-22 (×3): 100 mg via ORAL
  Filled 2019-12-19 (×4): qty 1

## 2019-12-19 MED ORDER — SENNOSIDES-DOCUSATE SODIUM 8.6-50 MG PO TABS: 1.0000 | ORAL_TABLET | Freq: Every evening | ORAL | Status: DC | PRN

## 2019-12-19 MED ORDER — PENICILLIN V POTASSIUM 500 MG PO TABS: 500.0000 mg | ORAL_TABLET | Freq: Two times a day (BID) | ORAL | Status: DC

## 2019-12-19 MED ORDER — ONDANSETRON HCL 4 MG/2ML IJ SOLN: 4.0000 mg | Freq: Four times a day (QID) | INTRAMUSCULAR | Status: DC | PRN

## 2019-12-19 MED ORDER — ADULT MULTIVITAMIN W/MINERALS CH
1.0000 | ORAL_TABLET | Freq: Every day | ORAL | Status: DC
  Administered 2019-12-19 – 2019-12-22 (×3): 1 via ORAL
  Filled 2019-12-19 (×3): qty 1

## 2019-12-19 MED ORDER — SODIUM CHLORIDE 0.9 % IV SOLN
2.0000 g | Freq: Three times a day (TID) | INTRAVENOUS | Status: AC
  Administered 2019-12-19 – 2019-12-21 (×8): 2 g via INTRAVENOUS
  Filled 2019-12-19 (×8): qty 2

## 2019-12-19 MED ORDER — DICLOFENAC SODIUM 1 % EX GEL
2.0000 g | Freq: Four times a day (QID) | CUTANEOUS | Status: DC | PRN
  Administered 2019-12-19: 2 g via TOPICAL
  Filled 2019-12-19: qty 100

## 2019-12-19 MED ORDER — BISACODYL 5 MG PO TBEC
5.0000 mg | DELAYED_RELEASE_TABLET | Freq: Every day | ORAL | Status: DC | PRN
  Filled 2019-12-19: qty 1

## 2019-12-19 MED ORDER — ASPIRIN EC 81 MG PO TBEC
81.0000 mg | DELAYED_RELEASE_TABLET | Freq: Every day | ORAL | Status: DC
  Administered 2019-12-19 – 2019-12-22 (×3): 81 mg via ORAL
  Filled 2019-12-19 (×3): qty 1

## 2019-12-19 MED ORDER — CEFAZOLIN SODIUM-DEXTROSE 2-4 GM/100ML-% IV SOLN
2.0000 g | INTRAVENOUS | Status: AC
  Administered 2019-12-20: 2 g via INTRAVENOUS
  Filled 2019-12-19: qty 100

## 2019-12-19 MED ORDER — VANCOMYCIN HCL 750 MG/150ML IV SOLN
750.0000 mg | Freq: Two times a day (BID) | INTRAVENOUS | Status: AC
  Administered 2019-12-19 – 2019-12-21 (×5): 750 mg via INTRAVENOUS
  Filled 2019-12-19 (×5): qty 150

## 2019-12-19 MED ORDER — ATORVASTATIN CALCIUM 10 MG PO TABS
10.0000 mg | ORAL_TABLET | Freq: Every day | ORAL | Status: DC
  Administered 2019-12-19 – 2019-12-22 (×3): 10 mg via ORAL
  Filled 2019-12-19 (×3): qty 1

## 2019-12-19 MED ORDER — ONDANSETRON HCL 4 MG PO TABS: 4.0000 mg | ORAL_TABLET | Freq: Four times a day (QID) | ORAL | Status: DC | PRN

## 2019-12-19 MED ORDER — FENTANYL CITRATE (PF) 100 MCG/2ML IJ SOLN
25.0000 ug | INTRAMUSCULAR | Status: AC | PRN
  Administered 2019-12-19 – 2019-12-20 (×3): 50 ug via INTRAVENOUS
  Filled 2019-12-19 (×3): qty 2

## 2019-12-19 MED ORDER — AMLODIPINE BESYLATE 10 MG PO TABS
10.0000 mg | ORAL_TABLET | Freq: Every morning | ORAL | Status: DC
  Administered 2019-12-19 – 2019-12-22 (×3): 10 mg via ORAL
  Filled 2019-12-19 (×3): qty 1

## 2019-12-19 MED ORDER — ACETAMINOPHEN 650 MG RE SUPP: 650.0000 mg | Freq: Four times a day (QID) | RECTAL | Status: DC | PRN

## 2019-12-19 MED ORDER — GABAPENTIN 300 MG PO CAPS
600.0000 mg | ORAL_CAPSULE | Freq: Two times a day (BID) | ORAL | Status: DC
  Administered 2019-12-19 – 2019-12-22 (×6): 600 mg via ORAL
  Filled 2019-12-19 (×7): qty 2

## 2019-12-19 MED ORDER — VANCOMYCIN HCL IN DEXTROSE 1-5 GM/200ML-% IV SOLN
1000.0000 mg | Freq: Once | INTRAVENOUS | Status: AC
  Administered 2019-12-19: 1000 mg via INTRAVENOUS
  Filled 2019-12-19: qty 200

## 2019-12-19 MED ORDER — ACETAMINOPHEN 325 MG PO TABS
650.0000 mg | ORAL_TABLET | Freq: Four times a day (QID) | ORAL | Status: DC | PRN
  Administered 2019-12-19 – 2019-12-20 (×2): 650 mg via ORAL
  Filled 2019-12-19 (×2): qty 2

## 2019-12-19 MED ORDER — METRONIDAZOLE IN NACL 5-0.79 MG/ML-% IV SOLN
500.0000 mg | Freq: Three times a day (TID) | INTRAVENOUS | Status: AC
  Administered 2019-12-19 – 2019-12-21 (×7): 500 mg via INTRAVENOUS
  Filled 2019-12-19 (×8): qty 100

## 2019-12-19 MED ORDER — LISINOPRIL 5 MG PO TABS
5.0000 mg | ORAL_TABLET | Freq: Every day | ORAL | Status: DC
  Administered 2019-12-21 – 2019-12-22 (×2): 5 mg via ORAL
  Filled 2019-12-19 (×3): qty 1

## 2019-12-19 MED ORDER — TIMOLOL MALEATE 0.5 % OP SOLN
1.0000 [drp] | Freq: Two times a day (BID) | OPHTHALMIC | Status: DC
  Administered 2019-12-19 – 2019-12-22 (×5): 1 [drp] via OPHTHALMIC
  Filled 2019-12-19 (×2): qty 5

## 2019-12-19 NOTE — ED Triage Notes (Signed)
Right foot pain long hx of pain and wound healing problems to that same foot. Pt reports that his pain has gotten worse and is unbearable. Pt has long Hx of surgeries and wound healing problem to that foo

## 2019-12-19 NOTE — Progress Notes (Signed)
Pharmacy Antibiotic Note  Cory Marks is a 59 y.o. male admitted on 12/19/2019 with osteomyelitis.  Pharmacy has been consulted for vancomycin dosing.  Pt had been on Pen VK tablets PTA, but failed outpt therapy.   Plan:  Vancomycin 2000 mg IV x1 loading dose, then vancomycin 750 mg IV q12h , target trough 15-20   Monitor clinical course, renal function, cultures as available   Height: 6' (182.9 cm) Weight: 99.8 kg (220 lb) IBW/kg (Calculated) : 77.6  Temp (24hrs), Avg:98.7 F (37.1 C), Min:98.7 F (37.1 C), Max:98.7 F (37.1 C)  Recent Labs  Lab 12/19/19 0519 12/19/19 0520  WBC 8.4  --   CREATININE 1.40*  --   LATICACIDVEN  --  1.0    Estimated Creatinine Clearance: 69.5 mL/min (A) (by C-G formula based on SCr of 1.4 mg/dL (H)).    Allergies  Allergen Reactions  . Oxycodone Itching and Palpitations    Antimicrobials this admission: 5/13 vancomycin >>    Dose adjustments this admission:   Microbiology results: 5/13 BCx:  5/13 COVID-19:  5/13 HIV antibody:     Thank you for allowing pharmacy to be a part of this patient's care.    Royetta Asal, PharmD, BCPS 12/19/2019 8:17 AM

## 2019-12-19 NOTE — Plan of Care (Signed)

## 2019-12-19 NOTE — ED Notes (Signed)
[facility] called for transport to [facility].  

## 2019-12-19 NOTE — ED Provider Notes (Signed)
[facility] DEPT Provider Note   CSN: [id number] Arrival date & time: 12/19/19  [id number]     History Chief Complaint  Patient presents with  . Foot Pain    Cory Marks is a 59 y.o. male with PMHx CAD, CKD stage III, neuropathy s/2 chemo from mantle cell lymphoma currently in remission, HTN, HLD, who presents to the ED today with complaint of gradual onset, constant, worsening, R foot and RLE pain x 1 week.  Patient reports he recently got new orthopedic shoes for his feet and noticed a small blister to his fifth right toe 1 week ago that has progressively gotten bigger in size.  Patient states his right lower extremity has also began to be painful and swollen after he noticed the ulcer.  He reports purulent drainage from the ulcer.  Patient denies fevers or chills.  He does have a history of first and second toe amputations done by both Dr. Maureen Ralphs and Dr. Doran Durand in the past.  She reports he was concerned he could have a blood clot in his leg due to the swelling.  No history of DVT/PE.  No recent prolonged travel or immobilization.  No hemoptysis.  No active malignancy currently, in remission since 2016.  No exogenous hormone use.   The history is provided by the patient and medical records.       Past Medical History:  Diagnosis Date  . Arthritis   . Bell's palsy   . CAD (coronary artery disease)   . Cancer [facility]) 2012   Mantle Cell Cancer- S/P stem cell transplant  . CKD (chronic kidney disease) stage 3, GFR 30-59 ml/min    per lov with pcp,  last seen at [facility] january 2020 per patient, stable per patient   . Diabetes mellitus without complication (HCC)    hx of lost weight due to cancer not on medications since 2014   . Dysrhythmia    atrial fib and atrial flutter, no repeat of dysrhymia since ablation in 2018 ,   . Eye injury    left eye hit by a wood chip early june 2020, lov for f/u with opthamologist 6-17 , drained a pustule,  progress  notes mention eye gradually improving with application of antibiotic  eye drops   . History of chemotherapy last tx 2014  . History of diabetes mellitus NONE SINCE 2012 CANCER  . History of splenectomy   . Hyperlipemia   . Hypertension   . Neuromuscular disorder ([place])    peripheral neuropathy  . Neuropathy    right leg due to football injury  . Polysubstance abuse ([place])   . Sinus bradycardia     Patient Active Problem List   Diagnosis Date Noted  . S/P reverse total shoulder arthroplasty, right 01/31/2019  . Spinal stenosis of lumbar region 10/10/2018  . Oropharyngeal dysphagia 07/11/2018  . Pneumonia in aspergillosis ([place]) 07/11/2018  . Degeneration of lumbar intervertebral disc 05/01/2018  . History of total knee replacement, left 03/21/2018  . Age-related nuclear cataract of right eye 01/01/2018  . Aphakia of eye, left 01/01/2018  . Ocular hypertension of left eye 01/01/2018  . Retained lens material following cataract surgery of left eye 09/22/2017  . OA (osteoarthritis) of knee 01/30/2017  . Right foot pain 11/04/2016  . Amputation, toe, traumatic, right, sequela ([place]) 11/04/2016  . Neuropathy 11/04/2016  . Typical atrial flutter ([place]) 09/12/2016  . Encounter for therapeutic drug monitoring 04/18/2016  . H/O asplenia 06/15/2015  .  Chronic systolic congestive heart failure ([place]) 10/20/2014  . Osteomyelitis ([place]) 10/19/2014  . Osteomyelitis of toe of right foot ([place]) 10/17/2014  . Septic arthritis of interphalangeal joint of toe of right foot ([place]) 10/17/2014  . Atrial flutter ([place]) 10/17/2014  . History of lymphoma 05/30/2014  . CKD (chronic kidney disease) stage 3, GFR 30-59 ml/min 02/27/2014  . Hyperlipidemia 01/23/2014  . Foot ulcer, right ([place]) 01/23/2014  . Right foot drop 01/23/2014  . CAD in native artery 01/23/2014  . Metabolic syndrome 99991111  . Encounter for screening colonoscopy 12/26/2013  . History of bradycardia 12/26/2013  . HTN (hypertension)  09/12/2012  . Lymphoma ([place]) 09/12/2012  . Diabetic peripheral neuropathy ([place]) 12/05/2011  . Erectile dysfunction 12/05/2011  . H/O autologous stem cell transplant ([place]) 05/30/2011  . Mantle cell lymphoma ([place]) 05/05/2011    Past Surgical History:  Procedure Laterality Date  . A-FLUTTER ABLATION N/A 09/12/2016   Procedure: A-Flutter Ablation;  Surgeon: Will Meredith Leeds, MD;  Location: [facility];  Service: Cardiovascular;  Laterality: N/A;  . AMPUTATION Right 10/20/2014   Procedure: AMPUTATION RIGHT GREAT TOE;  Surgeon: Gaynelle Arabian, MD;  Location[facility];  Service: Orthopedics;  Laterality: Right;  . AMPUTATION Right 12/25/2014   Procedure: RIGHT 2ND TOE AMPUTATION ;  Surgeon: Wylene Simmer, MD;  Location: [place];  Service: Orthopedics;  Laterality: Right;  . edentulation N/A y-2   prior to transplant  . EYE SURGERY    . REVERSE SHOULDER ARTHROPLASTY Right 01/31/2019   Procedure: REVERSE SHOULDER ARTHROPLASTY;  Surgeon: Justice Britain, MD;  [facility];  Service: Orthopedics;  Laterality: Right;  . right knee surgery Right    nerve graft back of leg  . SPLENECTOMY, TOTAL     mantle cell lymphoma  . TENOLYSIS Right 12/25/2014   Procedure: RIGHT 3,4,5 FLEXOR TENDON RELEASE ;  Surgeon: Wylene Simmer, MD;  Location: [facility];  Service: Orthopedics;  Laterality: Right;  . TOTAL KNEE ARTHROPLASTY Left 01/30/2017   Procedure: LEFT TOTAL KNEE ARTHROPLASTY;  Surgeon: Gaynelle Arabian, MD;  [facility];  Service: Orthopedics;  Laterality: Left;       Family History  Problem Relation Age of Onset  . Hypertension Mother   . Diabetes Mother   . Cancer Father        pancreatic cancer  . Diabetes Father   . Heart disease Father   . Diabetes Sister     Social History   Tobacco Use  . Smoking status: Never Smoker  . Smokeless tobacco: Never Used  Substance Use Topics  . Alcohol use: Yes    Alcohol/week: 1.0 standard drinks    Types: 1  Cans of beer per week    Comment: occasional   . Drug use: No    Comment: last use few years ago per patient - used marijuana ; denies use today 01-28-2019    Home Medications Prior to Admission medications   Medication Sig Start Date End Date Taking? Authorizing Provider  amitriptyline (ELAVIL) 50 MG tablet TK 1 T PO QD HS 03/25/19   [provider]  amLODipine (NORVASC) 10 MG tablet Take 1 tablet (10 mg total) by mouth every morning. 07/04/17   Dorena Dew, FNP  ammonium lactate (AMLACTIN) 12 % lotion Apply 1 application topically as needed for dry skin. 11/13/19   Felipa Furnace, DPM  atorvastatin (LIPITOR) 10 MG tablet  07/10/19   [provider]  atorvastatin (LIPITOR) 40 MG tablet  Take 1 tablet (40 mg total) by mouth daily. 03/06/17   Dorena Dew, FNP  Cholecalciferol (VITAMIN D) 50 MCG (2000 UT) tablet Take 2,000 Units by mouth daily.    [provider]  cyclobenzaprine (FLEXERIL) 10 MG tablet Take 1 tablet (10 mg total) by mouth 3 (three) times daily as needed for muscle spasms. 02/01/19   Shuford, Olivia Mackie, PA-C  diclofenac sodium (VOLTAREN) 1 % GEL Apply 1 application topically 4 (four) times daily as needed (pain).     [provider]  diclofenac Sodium (VOLTAREN) 1 % GEL  08/07/19   [provider]  diphenhydrAMINE (BENADRYL) 25 mg capsule Take 1 capsule (25 mg total) by mouth every 6 (six) hours as needed for itching. 02/03/17   Dorena Dew, FNP  gabapentin (NEURONTIN) 300 MG capsule  03/12/19   [provider]  gabapentin (NEURONTIN) 600 MG tablet Take 1 tablet (600 mg total) by mouth 3 (three) times daily. 05/18/18   Lanae Boast, FNP  HYDROmorphone (DILAUDID) 2 MG tablet Take 1 tablet (2 mg total) by mouth every 4 (four) hours as needed. 02/01/19   Shuford, Olivia Mackie, PA-C  lisinopril (PRINIVIL,ZESTRIL) 20 MG tablet TAKE 1 TABLET EVERY DAY Patient taking differently: Take 20 mg by mouth daily.  03/28/18   Tresa Garter, MD  lisinopril (ZESTRIL) 5 MG tablet  05/08/19   [provider]  morphine (MSIR) 15 MG tablet Take 15 mg by mouth 2 (two) times daily as needed for pain. 11/09/18   [provider]  ofloxacin (OCUFLOX) 0.3 % ophthalmic solution Place 1 drop into the left eye 3 times daily.    [provider]  penicillin v potassium (VEETID) 500 MG tablet Take 500 mg by mouth 2 (two) times daily.     [provider]  pyridOXINE (VITAMIN B-6) 100 MG tablet Take 100 mg by mouth daily.    [provider]  sildenafil (VIAGRA) 25 MG tablet Take 25 mg by mouth daily as needed for erectile dysfunction. 10/12/18   [provider]  temazepam (RESTORIL) 30 MG capsule TK 1 C PO QHS PRF SLP 03/11/19   [provider]  timolol (TIMOPTIC) 0.5 % ophthalmic solution  11/08/19   [provider]  traMADol (ULTRAM) 50 MG tablet Take by mouth.    [provider]  valACYclovir (VALTREX) 1000 MG tablet  10/04/19   [provider]    Allergies    Oxycodone  Review of Systems   Review of Systems  Constitutional: Negative for chills and fever.  Respiratory: Negative for shortness of breath.   Cardiovascular: Positive for leg swelling. Negative for chest pain.  Musculoskeletal: Positive for arthralgias.  Skin: Positive for wound.  All other systems reviewed and are negative.   Physical Exam Updated Vital Signs BP (!) 123/91 (BP Location: Left Arm)   Pulse 65   Temp 98.7 F (37.1 C) (Oral)   Resp 16   Ht 6' (1.829 m)   Wt 99.8 kg   SpO2 98%   BMI 29.84 kg/m   Physical Exam Vitals and nursing note reviewed.  Constitutional:      Appearance: He is not ill-appearing or diaphoretic.  HENT:     Head: Normocephalic and atraumatic.  Eyes:     Conjunctiva/sclera: Conjunctivae normal.  Cardiovascular:     Rate and Rhythm: Normal rate and regular rhythm.     Pulses: Normal pulses.  Pulmonary:     Effort: Pulmonary effort is normal.  Breath sounds: Normal breath sounds. No wheezing, rhonchi or rales.  Abdominal:     Palpations: Abdomen is soft.     Tenderness: There is no abdominal tenderness.  Musculoskeletal:     Cervical back: Neck supple.     Comments: See photos below. Full thickness ulcer noted to lateral aspect of R 5th digit with purulent drainage. RLE more swollen compared to LLE with surrounding erythema and TTP. 2+ DP Pulse.   Skin:    General: Skin is warm and dry.  Neurological:     Mental Status: He is alert.              ED Results / Procedures / Treatments   Labs (all labs ordered are listed, but only abnormal results are displayed) Labs Reviewed  COMPREHENSIVE METABOLIC PANEL - Abnormal; Notable for the following components:      Result Value   Glucose, Bld 102 (*)    BUN 21 (*)    Creatinine, Ser 1.40 (*)    Total Protein 6.3 (*)    GFR calc non Af Amer 55 (*)    All other components within normal limits  CULTURE, BLOOD (ROUTINE X 2)  CULTURE, BLOOD (ROUTINE X 2)  CBC WITH DIFFERENTIAL/PLATELET  LACTIC ACID, PLASMA  URINALYSIS, ROUTINE W REFLEX MICROSCOPIC  LACTIC ACID, PLASMA    EKG None  Radiology DG Foot Complete Right  Result Date: 12/19/2019 CLINICAL DATA:  Fifth toe ulcer. EXAM: RIGHT FOOT COMPLETE - 3+ VIEW COMPARISON:  Right foot x-rays dated September 27, 2019. FINDINGS: Progressive bony destruction the fifth middle phalanx and fifth proximal phalanx head with overlying soft tissue ulceration and soft tissue swelling of the fifth toe. Prior amputation of the first and second toes. No acute fracture. Joint spaces are preserved. Plantar ulceration of the midfoot. Diffuse soft tissue swelling of the foot. IMPRESSION: New osteomyelitis of the fifth middle and proximal phalanges. Electronically Signed   By: Titus Dubin M.D.   On: 12/19/2019 05:55    Procedures Procedures (including critical care time)  Medications Ordered in ED Medications  vancomycin (VANCOCIN)  IVPB 1000 mg/200 mL premix (1,000 mg Intravenous New Bag/Given 12/19/19 [id number])    ED Course  I have reviewed the triage vital signs and the nursing notes.  Pertinent labs & imaging results that were available during my care of the patient were reviewed by me and considered in my medical decision making (see chart for details).    MDM Rules/Calculators/A&P                      59 year old male with a history of neuropathy secondary to chemotherapy for mantle cell lymphoma who presents to the ED today with ulcer to his right fifth digit that has been present for a week and slowly growing in size noted to have purulent drainage as well.  On arrival to the ED patient is afebrile, nontachycardic nontachypneic.  He appears to be in no acute distress.  He does have full-thickness tear noted to the right fifth digit with purulent drainage.  His right lower extremity is more swollen compared to the left as well.  He has no risk factors for DVT besides history of cancer however he states he has been in remission since 2016.  Patient if the swelling is related to worsening infection versus DVT.  We do not currently have vascular tech onsite at this time of night and will hold off until the morning.  Regardless will work-up for  infection at this time with concern for osteomyelitis.   CBC without leukocytosis. Hgb stable.  CMP with creatinine 1.40; appears to be improved from pt's baseline  Lab Results  Component Value Date   CREATININE 1.40 (H) 12/19/2019   CREATININE 1.87 (H) 01/28/2019   CREATININE 2.12 (H) 05/18/2018   Lactic acid 1.0 Xray positive for osteo   IMPRESSION:  New osteomyelitis of the fifth middle and proximal phalanges.   Blood cultures obtained and pt started on Vancomycin at this time. Will consult hospitalist for admission.   Discussed case with Dr. Josephine Cables with [facility] who agrees to accept patient for admission.   This note was prepared using Dragon voice recognition  software and may include unintentional dictation errors due to the inherent limitations of voice recognition software.  Final Clinical Impression(s) / ED Diagnoses Final diagnoses:  Osteomyelitis of right foot, unspecified type [facility])    Rx / [place] Orders ED Discharge Orders    None       Eustaquio Maize, PA-C 12/19/19 0641    Shanon Rosser, MD 12/19/19 [id number]

## 2019-12-19 NOTE — Progress Notes (Signed)
Pharmacy Antibiotic Note  Cory Marks is a 59 y.o. male admitted on 12/19/2019 with osteomyelitis.  Pharmacy has been consulted for vancomycin dosing. Pharmacy subsequently consulted for cefepime and Flagyl dosing.   Plan:  Vancomycin 2000 mg IV x1 loading dose, then vancomycin 750 mg IV q12h , target trough 15-20   Cefepime 2 g iv q 8 hours  Flagyl 500 mg iv q 8 hours  Monitor clinical course, renal function, cultures as available   Height: 6' (182.9 cm) Weight: 99.8 kg (220 lb) IBW/kg (Calculated) : 77.6  Temp (24hrs), Avg:98.7 F (37.1 C), Min:98.7 F (37.1 C), Max:98.7 F (37.1 C)  Recent Labs  Lab 12/19/19 0519 12/19/19 0520 12/19/19 0908  WBC 8.4  --   --   CREATININE 1.40*  --   --   LATICACIDVEN  --  1.0 0.8    Estimated Creatinine Clearance: 69.5 mL/min (A) (by C-G formula based on SCr of 1.4 mg/dL (H)).    Allergies  Allergen Reactions  . Oxycodone Itching and Palpitations    Antimicrobials this admission: 5/13 vancomycin >>  5/13 cefepime >>  5/13 Flagyl >>    Dose adjustments this admission:   Microbiology results: 5/13 BCx:  5/13 COVID-19:  5/13 HIV antibody:     Thank you for allowing pharmacy to be a part of this patient's care.    Ulice Dash, PharmD, BCPS  12/19/2019 9:56 AM

## 2019-12-19 NOTE — H&P (View-Only) (Signed)
ORTHOPAEDIC CONSULTATION  REQUESTING PHYSICIAN: Antonieta Pert, MD  Chief Complaint: Ulceration osteomyelitis right little toe.  HPI: Cory Marks is a 59 y.o. male who presents with ulceration osteomyelitis right little toe.  Patient states he got a new pair of diabetic extra-depth shoes and that the shoes rub the ulcer on his toe.  Patient is status post previous great toe and second toe amputation.  Past Medical History:  Diagnosis Date  . Arthritis   . Bell's palsy   . CAD (coronary artery disease)   . Cancer [facility]) 2012   Mantle Cell Cancer- S/P stem cell transplant  . CKD (chronic kidney disease) stage 3, GFR 30-59 ml/min    per lov with pcp,  last seen at [facility] january 2020 per patient, stable per patient   . Diabetes mellitus without complication (HCC)    hx of lost weight due to cancer not on medications since 2014   . Dysrhythmia    atrial fib and atrial flutter, no repeat of dysrhymia since ablation in 2018 ,   . Eye injury    left eye hit by a wood chip early june 2020, lov for f/u with opthamologist 6-17 , drained a pustule,  progress notes mention eye gradually improving with application of antibiotic  eye drops   . History of chemotherapy last tx 2014  . History of diabetes mellitus NONE SINCE 2012 CANCER  . History of splenectomy   . Hyperlipemia   . Hypertension   . Neuromuscular disorder ([place])    peripheral neuropathy  . Neuropathy    right leg due to football injury  . Polysubstance abuse ([name])   . Sinus bradycardia    Past Surgical History:  Procedure Laterality Date  . A-FLUTTER ABLATION N/A 09/12/2016   Procedure: A-Flutter Ablation;  Surgeon: Will Meredith Leeds, MD;  Location: [facility];  Service: Cardiovascular;  Laterality: N/A;  . AMPUTATION Right 10/20/2014   Procedure: AMPUTATION RIGHT GREAT TOE;  Surgeon: Gaynelle Arabian, MD;  [facility];  Service: Orthopedics;  Laterality: Right;  . AMPUTATION Right 12/25/2014   Procedure:  RIGHT 2ND TOE AMPUTATION ;  Surgeon: Wylene Simmer, MD;  Location: [facility];  Service: Orthopedics;  Laterality: Right;  . edentulation N/A y-2   prior to transplant  . EYE SURGERY    . REVERSE SHOULDER ARTHROPLASTY Right 01/31/2019   Procedure: REVERSE SHOULDER ARTHROPLASTY;  Surgeon: Justice Britain, MD;  [facility];  Service: Orthopedics;  Laterality: Right;  . right knee surgery Right    nerve graft back of leg  . SPLENECTOMY, TOTAL     mantle cell lymphoma  . TENOLYSIS Right 12/25/2014   Procedure: RIGHT 3,4,5 FLEXOR TENDON RELEASE ;  Surgeon: Wylene Simmer, MD;  Location: [place] [facility];  Service: Orthopedics;  Laterality: Right;  . TOTAL KNEE ARTHROPLASTY Left 01/30/2017   Procedure: LEFT TOTAL KNEE ARTHROPLASTY;  Surgeon: Gaynelle Arabian, MD;  [facility];  Service: Orthopedics;  Laterality: Left;   Social History   Socioeconomic History  . Marital status: Married    Spouse name: Not on file  . Number of children: Not on file  . Years of education: Not on file  . Highest education level: Not on file  Occupational History  . Not on file  Tobacco Use  . Smoking status: Never Smoker  . Smokeless tobacco: Never Used  Substance and Sexual Activity  . Alcohol use: Yes    Alcohol/week: 1.0 standard drinks  Types: 1 Cans of beer per week    Comment: occasional   . Drug use: No    Comment: last use few years ago per patient - used marijuana ; denies use today 01-28-2019  . Sexual activity: Yes    Partners: Female  Other Topics Concern  . Not on file  Social History Narrative  . Not on file   Social Determinants of Health   Financial Resource Strain:   . Difficulty of Paying Living Expenses:   Food Insecurity:   . Worried About Charity fundraiser in the Last Year:   . Arboriculturist in the Last Year:   Transportation Needs:   . Film/video editor (Medical):   Marland Kitchen Lack of Transportation (Non-Medical):   Physical Activity:   . Days of  Exercise per Week:   . Minutes of Exercise per Session:   Stress:   . Feeling of Stress :   Social Connections:   . Frequency of Communication with Friends and Family:   . Frequency of Social Gatherings with Friends and Family:   . Attends Religious Services:   . Active Member of Clubs or Organizations:   . Attends Archivist Meetings:   Marland Kitchen Marital Status:    Family History  Problem Relation Age of Onset  . Hypertension Mother   . Diabetes Mother   . Cancer Father        pancreatic cancer  . Diabetes Father   . Heart disease Father   . Diabetes Sister    - negative except otherwise stated in the family history section Allergies  Allergen Reactions  . Oxycodone Itching and Palpitations   Prior to Admission medications   Medication Sig Start Date End Date Taking? Authorizing Provider  acetaminophen (TYLENOL) 325 MG tablet Take 650-975 mg by mouth every 6 (six) hours as needed for mild pain or headache.   Yes [provider]  amLODipine (NORVASC) 10 MG tablet Take 1 tablet (10 mg total) by mouth every morning. 07/04/17  Yes Dorena Dew, FNP  atorvastatin (LIPITOR) 10 MG tablet Take 10 mg by mouth daily.  07/10/19  Yes [provider]  diclofenac Sodium (VOLTAREN) 1 % GEL Apply 2 g topically 4 (four) times daily as needed (pain).  08/07/19  Yes [provider]  ELDERBERRY PO Take 1 tablet by mouth daily.   Yes [provider]  gabapentin (NEURONTIN) 300 MG capsule Take 600 mg by mouth 2 (two) times daily.  03/12/19  Yes [provider]  lisinopril (ZESTRIL) 5 MG tablet Take 5 mg by mouth daily.  05/08/19  Yes [provider]  Multiple Vitamin (MULTIVITAMIN WITH MINERALS) TABS tablet Take 1 tablet by mouth daily.   Yes [provider]  penicillin v potassium (VEETID) 500 MG tablet Take 500 mg by mouth 2 (two) times daily.    Yes [provider]  pyridOXINE (VITAMIN B-6) 100 MG tablet Take 100 mg by  mouth daily.   Yes [provider]  timolol (TIMOPTIC) 0.5 % ophthalmic solution Place 1 drop into the left eye 2 (two) times daily.  11/08/19  Yes [provider]   DG Foot Complete Right  Result Date: 12/19/2019 CLINICAL DATA:  Fifth toe ulcer. EXAM: RIGHT FOOT COMPLETE - 3+ VIEW COMPARISON:  Right foot x-rays dated September 27, 2019. FINDINGS: Progressive bony destruction the fifth middle phalanx and fifth proximal phalanx head with overlying soft tissue ulceration and soft tissue swelling of the fifth  toe. Prior amputation of the first and second toes. No acute fracture. Joint spaces are preserved. Plantar ulceration of the midfoot. Diffuse soft tissue swelling of the foot. IMPRESSION: New osteomyelitis of the fifth middle and proximal phalanges. Electronically Signed   By: Titus Dubin M.D.   On: 12/19/2019 05:55   - pertinent xrays, CT, MRI studies were reviewed and independently interpreted  Positive ROS: All other systems have been reviewed and were otherwise negative with the exception of those mentioned in the HPI and as above.  Physical Exam: General: Alert, no acute distress Psychiatric: Patient is competent for consent with normal mood and affect Lymphatic: No axillary or cervical lymphadenopathy Cardiovascular: No pedal edema Respiratory: No cyanosis, no use of accessory musculature GI: No organomegaly, abdomen is soft and non-tender    Images:  @ENCIMAGES @  Labs:  Lab Results  Component Value Date   HGBA1C 5.9 (H) 01/28/2019   HGBA1C 5.8 11/16/2017   HGBA1C 5.7 11/03/2016   ESRSEDRATE 8 12/19/2019   LABURIC 9.0 (H) 12/08/2010   LABURIC 10.2 (H) 12/02/2010   LABURIC 10.7 (H) 12/01/2010   REPTSTATUS 07/27/2014 FINAL 07/20/2014   CULT  07/20/2014    NO GROWTH 5 DAYS Performed at [facility]     Lab Results  Component Value Date   ALBUMIN 3.7 12/19/2019   ALBUMIN 4.5 05/18/2018   ALBUMIN 4.1 02/15/2018   LABURIC 9.0 (H) 12/08/2010    LABURIC 10.2 (H) 12/02/2010   LABURIC 10.7 (H) 12/01/2010    Neurologic: Patient does not have protective sensation bilateral lower extremities.   MUSCULOSKELETAL:   Skin: Examination patient has necrotic skin changes around the little toe with exposed bone right foot his right great toe and second toe amputation sites have healed well.  He has a good strong dorsalis pedis and posterior tibial pulse.  Review of the x-rays of the right foot shows osteomyelitis of the right little toe.  Patient has a history of gout but no recent laboratory values.  No tophaceous deposits on his feet.  Assessment: Assessment: Abscess osteomyelitis right little toe with diabetic insensate neuropathy status post great toe and second toe amputation.  Plan: Plan: We will plan for amputation of the third fourth and fifth toes right foot for a transmetatarsal amputation.  Risks and benefits were discussed importance of nonweightbearing immediately postoperatively.  Patient states he understands wishes to proceed at this time.  Plan for surgery tomorrow Friday.  Surgery currently scheduled for noon.  Thank you for the consult and the opportunity to see Mr. Macie Burows, MD [employer] [phone] 12:19 PM

## 2019-12-19 NOTE — H&P (Addendum)
History and Physical    Cory Marks [id number] DOB: 1961-04-26 DOA: 12/19/2019  PCP: [name], MD   Patient coming from: home  Chief Complaint: right leg pain  HPI: Cory Marks is a 59 y.o. 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 Patient reports it initially started as a blister on his fifth right toe a week ago and has been getting worse and bigger since then.  He has a history of first and second toe amputation done by both Dr.  Maureen Ralphs and Dr Jhonnie Garner in the past 3-5 yrs ago. He c/o 8/10 pain in in affected toe. Patient otherwise denies any nausea, vomiting, abdominal pain, chest pain, shortness of breath, fever, chills, headache, focal weakness, numbness tingling, speech difficulties. He had Pfizer COIVD vaccine, last dose >1 month. covid swab pending in ED.  In the ED, afebrile, vital signs stable blood pressure 120s to 130s heart rate in 50s to 60s, 99% room air Blood work with BUN 21 creatinine 1.4, normal CBC, lactic acid 1.0,, blood culture were obtained, x-ray of the foot showed osteomyelitis of the right fifth middle and proximal phalanges.  Patient was given vancomycin IV and admission was requested.  Review of Systems: All systems were reviewed and were negative except as mentioned in HPI above. Negative for fever Negative for chest pain Negative for shortness of breath.  Past Medical History:  Diagnosis Date  . Arthritis   . Bell's palsy   . CAD (coronary artery disease)   . Cancer [facility]) 2012   Mantle Cell Cancer- S/P stem cell transplant  . CKD (chronic kidney disease) stage 3, GFR 30-59 ml/min    per lov with pcp,  last seen at [facility] january 2020 per patient, stable per patient   . Diabetes mellitus without complication  (HCC)    hx of lost weight due to cancer not on medications since 2014   . Dysrhythmia    atrial fib and atrial flutter, no repeat of dysrhymia since ablation in 2018 ,   . Eye injury    left eye hit by a wood chip early june 2020, lov for f/u with opthamologist 6-17 , drained a pustule,  progress notes mention eye gradually improving with application of antibiotic  eye drops   . History of chemotherapy last tx 2014  . History of diabetes mellitus NONE SINCE 2012 CANCER  . History of splenectomy   . Hyperlipemia   . Hypertension   . Neuromuscular disorder ([place])    peripheral neuropathy  . Neuropathy    right leg due to football injury  . Polysubstance abuse ([place])   . Sinus bradycardia     Past Surgical History:  Procedure Laterality Date  . A-FLUTTER ABLATION N/A 09/12/2016   Procedure: A-Flutter Ablation;  Surgeon: Will Meredith Leeds, MD;  Location: [place] [facility];  Service: Cardiovascular;  Laterality: N/A;  . AMPUTATION Right 10/20/2014   Procedure: AMPUTATION RIGHT GREAT TOE;  Surgeon: Gaynelle Arabian, MD;  [facility]: [facility];  Service: Orthopedics;  Laterality: Right;  . AMPUTATION Right 12/25/2014   Procedure: RIGHT 2ND TOE AMPUTATION ;  Surgeon: Wylene Simmer, MD;  Location: [place];  Service: Orthopedics;  Laterality: Right;  . edentulation N/A y-2   prior to transplant  . EYE SURGERY    .  REVERSE SHOULDER ARTHROPLASTY Right 01/31/2019   Procedure: REVERSE SHOULDER ARTHROPLASTY;  Surgeon: Justice Britain, MD;  Location[facility];  Service: Orthopedics;  Laterality: Right;  . right knee surgery Right    nerve graft back of leg  . SPLENECTOMY, TOTAL     mantle cell lymphoma  . TENOLYSIS Right 12/25/2014   Procedure: RIGHT 3,4,5 FLEXOR TENDON RELEASE ;  Surgeon: Wylene Simmer, MD;  Location: [place];  Service: Orthopedics;  Laterality: Right;  . TOTAL KNEE ARTHROPLASTY Left 01/30/2017   Procedure: LEFT TOTAL KNEE ARTHROPLASTY;  Surgeon: Gaynelle Arabian, MD;  [facility];  Service: Orthopedics;  Laterality: Left;     reports that he has never smoked. He has never used smokeless tobacco. He reports current alcohol use of about 1.0 standard drinks of alcohol per week. He reports that he does not use drugs.  Allergies  Allergen Reactions  . Oxycodone Itching and Palpitations    Family History  Problem Relation Age of Onset  . Hypertension Mother   . Diabetes Mother   . Cancer Father        pancreatic cancer  . Diabetes Father   . Heart disease Father   . Diabetes Sister      Prior to Admission medications   Medication Sig Start Date End Date Taking? Authorizing Provider  acetaminophen (TYLENOL) 325 MG tablet Take 650-975 mg by mouth every 6 (six) hours as needed for mild pain or headache.   Yes [provider]  amLODipine (NORVASC) 10 MG tablet Take 1 tablet (10 mg total) by mouth every morning. 07/04/17  Yes Dorena Dew, FNP  atorvastatin (LIPITOR) 10 MG tablet Take 10 mg by mouth daily.  07/10/19  Yes [provider]  diclofenac Sodium (VOLTAREN) 1 % GEL Apply 2 g topically 4 (four) times daily as needed (pain).  08/07/19  Yes [provider]  ELDERBERRY PO Take 1 tablet by mouth daily.   Yes [provider]  gabapentin (NEURONTIN) 300 MG capsule Take 600 mg by mouth 2 (two) times daily.  03/12/19  Yes [provider]  lisinopril (ZESTRIL) 5 MG tablet Take 5 mg by mouth daily.  05/08/19  Yes [provider]  Multiple Vitamin (MULTIVITAMIN WITH MINERALS) TABS tablet Take 1 tablet by mouth daily.   Yes [provider]  penicillin v potassium (VEETID) 500 MG tablet Take 500 mg by mouth 2 (two) times daily.    Yes [provider]  pyridOXINE (VITAMIN B-6) 100 MG tablet Take 100 mg by mouth daily.   Yes [provider]  timolol (TIMOPTIC) 0.5 % ophthalmic solution Place 1 drop into the left eye 2 (two) times daily.  11/08/19  Yes [provider]  ammonium lactate (AMLACTIN) 12 % lotion Apply 1 application topically as needed for dry skin. Patient not taking: Reported on 12/19/2019 11/13/19   Felipa Furnace, DPM  atorvastatin (LIPITOR) 40 MG tablet Take 1 tablet (40 mg total) by mouth daily. Patient not taking: Reported on 12/19/2019 03/06/17   Dorena Dew, FNP  cyclobenzaprine (FLEXERIL) 10 MG tablet Take 1 tablet (10 mg total) by mouth 3 (three) times daily as needed for muscle spasms. Patient not taking: Reported on 12/19/2019 02/01/19   Shuford, Olivia Mackie, PA-C  diphenhydrAMINE (BENADRYL) 25 mg capsule Take 1 capsule (25 mg total) by mouth every 6 (six) hours as needed for itching. Patient not taking: Reported on 12/19/2019 02/03/17   Dorena Dew, FNP  gabapentin (NEURONTIN)  600 MG tablet Take 1 tablet (600 mg total) by mouth 3 (three) times daily. Patient not taking: Reported on 12/19/2019 05/18/18   Lanae Boast, FNP  HYDROmorphone (DILAUDID) 2 MG tablet Take 1 tablet (2 mg total) by mouth every 4 (four) hours as needed. Patient not taking: Reported on 12/19/2019 02/01/19   Shuford, Olivia Mackie, PA-C  lisinopril (PRINIVIL,ZESTRIL) 20 MG tablet TAKE 1 TABLET EVERY DAY Patient not taking: No sig reported 03/28/18   Tresa Garter, MD    Physical Exam: Vitals:   12/19/19 0505 12/19/19 0600 12/19/19 0700 12/19/19 0800  BP: (!) 128/91 (!) 122/98 (!) 130/93 117/88  Pulse: (!) 55 (!) 56 (!) 50 (!) 53  Resp: 15  17   Temp:      TempSrc:      SpO2: 99% 94% 99% 95%  Weight:      Height:        General exam: AAOx3,NAD, weak appearing. HEENT:Oral mucosa moist, Ear/Nose WNL grossly, dentition normal. Respiratory system: bilaterally clear,no wheezing or crackles,no use of accessory muscle Cardiovascular system: S1 & S2 +, No JVD,. Gastrointestinal system: Abdomen soft, NT,ND, BS+ Nervous System:Alert, awake, moving extremities and grossly nonfocal Extremities: rt fifth toe area of pus coming out, lower lew  hyperpigmented and edematous-chronic Skin: No rashes,no icterus. MSK: Normal muscle bulk,tone, power     Labs on Admission: I have personally reviewed following labs and imaging studies  CBC: Recent Labs  Lab 12/19/19 0519  WBC 8.4  NEUTROABS 5.3  HGB 14.5  HCT 43.2  MCV 98.0  PLT 99991111   Basic Metabolic Panel: Recent Labs  Lab 12/19/19 0519  NA 137  K 4.0  CL 101  CO2 26  GLUCOSE 102*  BUN 21*  CREATININE 1.40*  CALCIUM 9.3   GFR: Estimated Creatinine Clearance: 69.5 mL/min (A) (by C-G formula based on SCr of 1.4 mg/dL (H)). Liver Function Tests: Recent Labs  Lab 12/19/19 0519  AST 15  ALT 15  ALKPHOS 42  BILITOT 0.8  PROT 6.3*  ALBUMIN 3.7   No results for input(s): LIPASE, AMYLASE in the last 168 hours. No results for input(s): AMMONIA in the last 168 hours. Coagulation Profile: No results for input(s): INR, PROTIME in the last 168 hours. Cardiac Enzymes: No results for input(s): CKTOTAL, CKMB, CKMBINDEX, TROPONINI in the last 168 hours. BNP (last 3 results) No results for input(s): PROBNP in the last 8760 hours. HbA1C: No results for input(s): HGBA1C in the last 72 hours. CBG: No results for input(s): GLUCAP in the last 168 hours. Lipid Profile: No results for input(s): CHOL, HDL, LDLCALC, TRIG, CHOLHDL, LDLDIRECT in the last 72 hours. Thyroid Function Tests: No results for input(s): TSH, T4TOTAL, FREET4, T3FREE, THYROIDAB in the last 72 hours. Anemia Panel: No results for input(s): VITAMINB12, FOLATE, FERRITIN, TIBC, IRON, RETICCTPCT in the last 72 hours. Urine analysis:    Component Value Date/Time   COLORURINE YELLOW 12/19/2019 0624   APPEARANCEUR CLEAR 12/19/2019 0624   APPEARANCEUR Clear 05/18/2018 0941   LABSPEC 1.018 12/19/2019 0624   PHURINE 6.0 12/19/2019 0624   GLUCOSEU NEGATIVE 12/19/2019 0624   HGBUR NEGATIVE 12/19/2019 0624   BILIRUBINUR NEGATIVE 12/19/2019 0624   BILIRUBINUR Negative 05/18/2018 0941   KETONESUR NEGATIVE  12/19/2019 0624   PROTEINUR NEGATIVE 12/19/2019 0624   UROBILINOGEN 0.2 02/15/2018 0939   UROBILINOGEN 0.2 08/17/2017 0901   NITRITE NEGATIVE 12/19/2019 0624   LEUKOCYTESUR NEGATIVE 12/19/2019 0624    Radiological Exams on Admission: DG Foot Complete Right  Result  Date: 12/19/2019 CLINICAL DATA:  Fifth toe ulcer. EXAM: RIGHT FOOT COMPLETE - 3+ VIEW COMPARISON:  Right foot x-rays dated September 27, 2019. FINDINGS: Progressive bony destruction the fifth middle phalanx and fifth proximal phalanx head with overlying soft tissue ulceration and soft tissue swelling of the fifth toe. Prior amputation of the first and second toes. No acute fracture. Joint spaces are preserved. Plantar ulceration of the midfoot. Diffuse soft tissue swelling of the foot. IMPRESSION: New osteomyelitis of the fifth middle and proximal phalanges. Electronically Signed   By: Titus Dubin M.D.   On: 12/19/2019 05:55    Assessment/Plan  Osteomyelitis of the Right fifth middle and proximal phalanges:Pharmacy consulted for vancomycin and add cefepime/flagyl-pharmacy to dose,hold penicillin while on iv antibiotics. Immunocompromised due to splenectomy status.He has x of toe amputations and at risk of worsening infection and limb loss, will consult Orthopedic for further eval- left message to [facility] ( spoke w/ Dr Maureen Ralphs- he is on vacation- asked me to notify his office). Cont on Norco for pain control.  HTN:BP stable continue his home amlodipine, lisinopril on amlodipine.  Hyperlipidemia resume his Lipitor.  Mantle cell lymphoma of spleen: s/p spleenecomty in 2011, on prophylactic penicillin - hold while on iv antibiotics. Cont Neurontin for neuropathy.  CAD in native artery/history of paroxysmal a flutter: Stable.Continue his Lipitor, asa 81 mg, denies hx of stents or MI.  CKD (chronic kidney disease) stage IIIa- bun/cr stable. Monitor while on iv antibiotics.  Addendum at 12:03 pm: Called by Dr. Griffin Basil and  discussed with him- he is requesting the patient to be transferred to [facility] to be seen by Dr. Sharol Given for surgery tomorrow and requested to keep NPO past midnight. Will admit to [facility], signout to Dr. Teryl Lucy.  Body mass index is 29.84 kg/m.   Severity of Illness: * I certify that at the point of admission it is my clinical judgment that the patient will require inpatient hospital care spanning beyond 2 midnights from the point of admission due to high intensity of service, high risk for further deterioration and high frequency of surveillance required to use acute osteomyelitis needing further treatment hospitalization IV antibiotics and assessment by specialist, with Hx of amputation, at risk of worsening infection.   DVT prophylaxis:SCD/Lovenox. Code Status:Full code. Family Communication:Admission, patients condition and plan of care including tests being ordered have been discussed with the patient and he indicates understanding and agree with the plan and Code Status.  Consults called:  Called and left msg for Dr Alusio/[facility]thoped[facility].  Antonieta Pert MD [facility]  If 7PM-7AM, please contact night-coverage [email or web address]  12/19/2019, 8:23 AM

## 2019-12-19 NOTE — ED Notes (Signed)
Pt provided breakfast tray.

## 2019-12-19 NOTE — Consult Note (Signed)
ORTHOPAEDIC CONSULTATION  REQUESTING PHYSICIAN: Antonieta Pert, MD  Chief Complaint: Ulceration osteomyelitis right little toe.  HPI: Cory Marks is a 59 y.o. male who presents with ulceration osteomyelitis right little toe.  Patient states he got a new pair of diabetic extra-depth shoes and that the shoes rub the ulcer on his toe.  Patient is status post previous great toe and second toe amputation.  Past Medical History:  Diagnosis Date  . Arthritis   . Bell's palsy   . CAD (coronary artery disease)   . Cancer [facility]) 2012   Mantle Cell Cancer- S/P stem cell transplant  . CKD (chronic kidney disease) stage 3, GFR 30-59 ml/min    per lov with pcp,  last seen at [facility] january 2020 per patient, stable per patient   . Diabetes mellitus without complication (HCC)    hx of lost weight due to cancer not on medications since 2014   . Dysrhythmia    atrial fib and atrial flutter, no repeat of dysrhymia since ablation in 2018 ,   . Eye injury    left eye hit by a wood chip early june 2020, lov for f/u with opthamologist 6-17 , drained a pustule,  progress notes mention eye gradually improving with application of antibiotic  eye drops   . History of chemotherapy last tx 2014  . History of diabetes mellitus NONE SINCE 2012 CANCER  . History of splenectomy   . Hyperlipemia   . Hypertension   . Neuromuscular disorder ([place])    peripheral neuropathy  . Neuropathy    right leg due to football injury  . Polysubstance abuse ([place])   . Sinus bradycardia    Past Surgical History:  Procedure Laterality Date  . A-FLUTTER ABLATION N/A 09/12/2016   Procedure: A-Flutter Ablation;  Surgeon: Will Meredith Leeds, MD;  Location: [place] [facility];  Service: Cardiovascular;  Laterality: N/A;  . AMPUTATION Right 10/20/2014   Procedure: AMPUTATION RIGHT GREAT TOE;  Surgeon: Gaynelle Arabian, MD;  [facility];  Service: Orthopedics;  Laterality: Right;  . AMPUTATION Right 12/25/2014   Procedure:  RIGHT 2ND TOE AMPUTATION ;  Surgeon: Wylene Simmer, MD;  Location: [place];  Service: Orthopedics;  Laterality: Right;  . edentulation N/A y-2   prior to transplant  . EYE SURGERY    . REVERSE SHOULDER ARTHROPLASTY Right 01/31/2019   Procedure: REVERSE SHOULDER ARTHROPLASTY;  Surgeon: Justice Britain, MD;  [facility];  Service: Orthopedics;  Laterality: Right;  . right knee surgery Right    nerve graft back of leg  . SPLENECTOMY, TOTAL     mantle cell lymphoma  . TENOLYSIS Right 12/25/2014   Procedure: RIGHT 3,4,5 FLEXOR TENDON RELEASE ;  Surgeon: Wylene Simmer, MD;  Location: [place];  Service: Orthopedics;  Laterality: Right;  . TOTAL KNEE ARTHROPLASTY Left 01/30/2017   Procedure: LEFT TOTAL KNEE ARTHROPLASTY;  Surgeon: Gaynelle Arabian, MD;  [facility];  Service: Orthopedics;  Laterality: Left;   Social History   Socioeconomic History  . Marital status: Married    Spouse name: Not on file  . Number of children: Not on file  . Years of education: Not on file  . Highest education level: Not on file  Occupational History  . Not on file  Tobacco Use  . Smoking status: Never Smoker  . Smokeless tobacco: Never Used  Substance and Sexual Activity  . Alcohol use: Yes    Alcohol/week: 1.0 standard drinks  Types: 1 Cans of beer per week    Comment: occasional   . Drug use: No    Comment: last use few years ago per patient - used marijuana ; denies use today 01-28-2019  . Sexual activity: Yes    Partners: Female  Other Topics Concern  . Not on file  Social History Narrative  . Not on file   Social Determinants of Health   Financial Resource Strain:   . Difficulty of Paying Living Expenses:   Food Insecurity:   . Worried About Charity fundraiser in the Last Year:   . Arboriculturist in the Last Year:   Transportation Needs:   . Film/video editor (Medical):   Marland Kitchen Lack of Transportation (Non-Medical):   Physical Activity:   . Days of  Exercise per Week:   . Minutes of Exercise per Session:   Stress:   . Feeling of Stress :   Social Connections:   . Frequency of Communication with Friends and Family:   . Frequency of Social Gatherings with Friends and Family:   . Attends Religious Services:   . Active Member of Clubs or Organizations:   . Attends Archivist Meetings:   Marland Kitchen Marital Status:    Family History  Problem Relation Age of Onset  . Hypertension Mother   . Diabetes Mother   . Cancer Father        pancreatic cancer  . Diabetes Father   . Heart disease Father   . Diabetes Sister    - negative except otherwise stated in the family history section Allergies  Allergen Reactions  . Oxycodone Itching and Palpitations   Prior to Admission medications   Medication Sig Start Date End Date Taking? Authorizing Provider  acetaminophen (TYLENOL) 325 MG tablet Take 650-975 mg by mouth every 6 (six) hours as needed for mild pain or headache.   Yes [provider]  amLODipine (NORVASC) 10 MG tablet Take 1 tablet (10 mg total) by mouth every morning. 07/04/17  Yes Dorena Dew, FNP  atorvastatin (LIPITOR) 10 MG tablet Take 10 mg by mouth daily.  07/10/19  Yes [provider]  diclofenac Sodium (VOLTAREN) 1 % GEL Apply 2 g topically 4 (four) times daily as needed (pain).  08/07/19  Yes [provider]  ELDERBERRY PO Take 1 tablet by mouth daily.   Yes [provider]  gabapentin (NEURONTIN) 300 MG capsule Take 600 mg by mouth 2 (two) times daily.  03/12/19  Yes [provider]  lisinopril (ZESTRIL) 5 MG tablet Take 5 mg by mouth daily.  05/08/19  Yes [provider]  Multiple Vitamin (MULTIVITAMIN WITH MINERALS) TABS tablet Take 1 tablet by mouth daily.   Yes [provider]  penicillin v potassium (VEETID) 500 MG tablet Take 500 mg by mouth 2 (two) times daily.    Yes [provider]  pyridOXINE (VITAMIN B-6) 100 MG tablet Take 100 mg by  mouth daily.   Yes [provider]  timolol (TIMOPTIC) 0.5 % ophthalmic solution Place 1 drop into the left eye 2 (two) times daily.  11/08/19  Yes [provider]   DG Foot Complete Right  Result Date: 12/19/2019 CLINICAL DATA:  Fifth toe ulcer. EXAM: RIGHT FOOT COMPLETE - 3+ VIEW COMPARISON:  Right foot x-rays dated September 27, 2019. FINDINGS: Progressive bony destruction the fifth middle phalanx and fifth proximal phalanx head with overlying soft tissue ulceration and soft tissue swelling of the fifth  toe. Prior amputation of the first and second toes. No acute fracture. Joint spaces are preserved. Plantar ulceration of the midfoot. Diffuse soft tissue swelling of the foot. IMPRESSION: New osteomyelitis of the fifth middle and proximal phalanges. Electronically Signed   By: Titus Dubin M.D.   On: 12/19/2019 05:55   - pertinent xrays, CT, MRI studies were reviewed and independently interpreted  Positive ROS: All other systems have been reviewed and were otherwise negative with the exception of those mentioned in the HPI and as above.  Physical Exam: General: Alert, no acute distress Psychiatric: Patient is competent for consent with normal mood and affect Lymphatic: No axillary or cervical lymphadenopathy Cardiovascular: No pedal edema Respiratory: No cyanosis, no use of accessory musculature GI: No organomegaly, abdomen is soft and non-tender    Images:  @ENCIMAGES @  Labs:  Lab Results  Component Value Date   HGBA1C 5.9 (H) 01/28/2019   HGBA1C 5.8 11/16/2017   HGBA1C 5.7 11/03/2016   ESRSEDRATE 8 12/19/2019   LABURIC 9.0 (H) 12/08/2010   LABURIC 10.2 (H) 12/02/2010   LABURIC 10.7 (H) 12/01/2010   REPTSTATUS 07/27/2014 FINAL 07/20/2014   CULT  07/20/2014    NO GROWTH 5 DAYS Performed at [facility]     Lab Results  Component Value Date   ALBUMIN 3.7 12/19/2019   ALBUMIN 4.5 05/18/2018   ALBUMIN 4.1 02/15/2018   LABURIC 9.0 (H) 12/08/2010    LABURIC 10.2 (H) 12/02/2010   LABURIC 10.7 (H) 12/01/2010    Neurologic: Patient does not have protective sensation bilateral lower extremities.   MUSCULOSKELETAL:   Skin: Examination patient has necrotic skin changes around the little toe with exposed bone right foot his right great toe and second toe amputation sites have healed well.  He has a good strong dorsalis pedis and posterior tibial pulse.  Review of the x-rays of the right foot shows osteomyelitis of the right little toe.  Patient has a history of gout but no recent laboratory values.  No tophaceous deposits on his feet.  Assessment: Assessment: Abscess osteomyelitis right little toe with diabetic insensate neuropathy status post great toe and second toe amputation.  Plan: Plan: We will plan for amputation of the third fourth and fifth toes right foot for a transmetatarsal amputation.  Risks and benefits were discussed importance of nonweightbearing immediately postoperatively.  Patient states he understands wishes to proceed at this time.  Plan for surgery tomorrow Friday.  Surgery currently scheduled for noon.  Thank you for the consult and the opportunity to see Mr. Macie Burows, MD [employer] [phone] 12:19 PM

## 2019-12-20 ENCOUNTER — Inpatient Hospital Stay (HOSPITAL_COMMUNITY): Payer: Medicare HMO | Admitting: Certified Registered Nurse Anesthetist

## 2019-12-20 ENCOUNTER — Encounter (HOSPITAL_COMMUNITY)
Admission: EM | Disposition: A | Discharge status: Home Health | Payer: Self-pay | Source: Home / Self Care | Attending: Internal Medicine

## 2019-12-20 ENCOUNTER — Encounter (HOSPITAL_COMMUNITY): Payer: Self-pay | Admitting: Internal Medicine

## 2019-12-20 DIAGNOSIS — L02611 Cutaneous abscess of right foot: Secondary | ICD-10-CM

## 2019-12-20 DIAGNOSIS — C8317 Mantle cell lymphoma, spleen: Secondary | ICD-10-CM

## 2019-12-20 DIAGNOSIS — N1831 Chronic kidney disease, stage 3a: Secondary | ICD-10-CM

## 2019-12-20 DIAGNOSIS — I10 Essential (primary) hypertension: Secondary | ICD-10-CM

## 2019-12-20 DIAGNOSIS — I2510 Atherosclerotic heart disease of native coronary artery without angina pectoris: Secondary | ICD-10-CM

## 2019-12-20 DIAGNOSIS — E785 Hyperlipidemia, unspecified: Secondary | ICD-10-CM

## 2019-12-20 HISTORY — PX: AMPUTATION: SHX166

## 2019-12-20 LAB — COMPREHENSIVE METABOLIC PANEL
ALT: 15 U/L (ref 0–44)
AST: 15 U/L (ref 15–41)
Albumin: 3.1 g/dL — ABNORMAL LOW (ref 3.5–5.0)
Alkaline Phosphatase: 36 U/L — ABNORMAL LOW (ref 38–126)
Anion gap: 11 (ref 5–15)
BUN: 16 mg/dL (ref 6–20)
CO2: 26 mmol/L (ref 22–32)
Calcium: 9.4 mg/dL (ref 8.9–10.3)
Chloride: 100 mmol/L (ref 98–111)
Creatinine, Ser: 1.23 mg/dL (ref 0.61–1.24)
GFR calc Af Amer: >60 mL/min (ref >60)
GFR calc non Af Amer: >60 mL/min (ref >60)
Glucose, Bld: 83 mg/dL (ref 70–99)
Potassium: 3.9 mmol/L (ref 3.5–5.1)
Sodium: 137 mmol/L (ref 135–145)
Total Bilirubin: 0.8 mg/dL (ref 0.3–1.2)
Total Protein: 5.6 g/dL — ABNORMAL LOW (ref 6.5–8.1)

## 2019-12-20 LAB — GLUCOSE, CAPILLARY: Glucose-Capillary: 96 mg/dL (ref 70–99)

## 2019-12-20 LAB — CBC
HCT: 43.2 % (ref 39.0–52.0)
Hemoglobin: 14.6 g/dL (ref 13.0–17.0)
MCH: 32.8 pg (ref 26.0–34.0)
MCHC: 33.8 g/dL (ref 30.0–36.0)
MCV: 97.1 fL (ref 80.0–100.0)
Platelets: 282 10*3/uL (ref 150–400)
RBC: 4.45 MIL/uL (ref 4.22–5.81)
RDW: 13 % (ref 11.5–15.5)
WBC: 5 10*3/uL (ref 4.0–10.5)
nRBC: 0 % (ref 0.0–0.2)

## 2019-12-20 LAB — C-REACTIVE PROTEIN: CRP: 1.5 mg/dL — ABNORMAL HIGH (ref <1.0)

## 2019-12-20 SURGERY — AMPUTATION DIGIT
Anesthesia: General | Laterality: Right

## 2019-12-20 MED ORDER — MIDAZOLAM HCL 2 MG/2ML IJ SOLN
INTRAMUSCULAR | Status: DC | PRN
  Administered 2019-12-20: 2 mg via INTRAVENOUS

## 2019-12-20 MED ORDER — SODIUM CHLORIDE 0.9 % IV SOLN: INTRAVENOUS | Status: DC

## 2019-12-20 MED ORDER — ACETAMINOPHEN 325 MG PO TABS: 325.0000 mg | ORAL_TABLET | Freq: Four times a day (QID) | ORAL | Status: DC | PRN

## 2019-12-20 MED ORDER — TRAMADOL HCL 50 MG PO TABS
50.0000 mg | ORAL_TABLET | Freq: Four times a day (QID) | ORAL | Status: DC
  Administered 2019-12-20 – 2019-12-22 (×6): 50 mg via ORAL
  Filled 2019-12-20 (×6): qty 1

## 2019-12-20 MED ORDER — METHOCARBAMOL 1000 MG/10ML IJ SOLN
500.0000 mg | Freq: Four times a day (QID) | INTRAVENOUS | Status: DC | PRN
  Filled 2019-12-20: qty 5

## 2019-12-20 MED ORDER — 0.9 % SODIUM CHLORIDE (POUR BTL) OPTIME
TOPICAL | Status: DC | PRN
  Administered 2019-12-20: 1000 mL

## 2019-12-20 MED ORDER — DEXAMETHASONE SODIUM PHOSPHATE 10 MG/ML IJ SOLN
INTRAMUSCULAR | Status: DC | PRN
  Administered 2019-12-20: 5 mg via INTRAVENOUS

## 2019-12-20 MED ORDER — LIDOCAINE 2% (20 MG/ML) 5 ML SYRINGE
INTRAMUSCULAR | Status: DC | PRN
  Administered 2019-12-20: 60 mg via INTRAVENOUS

## 2019-12-20 MED ORDER — METHOCARBAMOL 500 MG PO TABS
500.0000 mg | ORAL_TABLET | Freq: Four times a day (QID) | ORAL | Status: DC | PRN
  Administered 2019-12-20 – 2019-12-21 (×2): 500 mg via ORAL
  Filled 2019-12-20 (×3): qty 1

## 2019-12-20 MED ORDER — MIDAZOLAM HCL 2 MG/2ML IJ SOLN
INTRAMUSCULAR | Status: AC
  Filled 2019-12-20: qty 2

## 2019-12-20 MED ORDER — PROPOFOL 10 MG/ML IV BOLUS
INTRAVENOUS | Status: DC | PRN
  Administered 2019-12-20: 130 mg via INTRAVENOUS

## 2019-12-20 MED ORDER — ONDANSETRON HCL 4 MG/2ML IJ SOLN: 4.0000 mg | Freq: Once | INTRAMUSCULAR | Status: DC | PRN

## 2019-12-20 MED ORDER — FENTANYL CITRATE (PF) 250 MCG/5ML IJ SOLN
INTRAMUSCULAR | Status: DC | PRN
  Administered 2019-12-20 (×2): 50 ug via INTRAVENOUS

## 2019-12-20 MED ORDER — LACTATED RINGERS IV SOLN: INTRAVENOUS | Status: DC | PRN

## 2019-12-20 MED ORDER — METOCLOPRAMIDE HCL 5 MG/ML IJ SOLN: 5.0000 mg | Freq: Three times a day (TID) | INTRAMUSCULAR | Status: DC | PRN

## 2019-12-20 MED ORDER — CHLORHEXIDINE GLUCONATE 4 % EX LIQD
CUTANEOUS | Status: AC
  Filled 2019-12-20: qty 15

## 2019-12-20 MED ORDER — FENTANYL CITRATE (PF) 250 MCG/5ML IJ SOLN
INTRAMUSCULAR | Status: AC
  Filled 2019-12-20: qty 5

## 2019-12-20 MED ORDER — ONDANSETRON HCL 4 MG/2ML IJ SOLN
INTRAMUSCULAR | Status: DC | PRN
  Administered 2019-12-20: 4 mg via INTRAVENOUS

## 2019-12-20 MED ORDER — FENTANYL CITRATE (PF) 100 MCG/2ML IJ SOLN
INTRAMUSCULAR | Status: AC
  Filled 2019-12-20: qty 2

## 2019-12-20 MED ORDER — FENTANYL CITRATE (PF) 100 MCG/2ML IJ SOLN
25.0000 ug | INTRAMUSCULAR | Status: DC | PRN
  Administered 2019-12-20: 50 ug via INTRAVENOUS

## 2019-12-20 MED ORDER — PHENYLEPHRINE HCL (PRESSORS) 10 MG/ML IV SOLN
INTRAVENOUS | Status: DC | PRN
  Administered 2019-12-20: 80 ug via INTRAVENOUS
  Administered 2019-12-20: 120 ug via INTRAVENOUS

## 2019-12-20 MED ORDER — METOCLOPRAMIDE HCL 5 MG PO TABS: 5.0000 mg | ORAL_TABLET | Freq: Three times a day (TID) | ORAL | Status: DC | PRN

## 2019-12-20 MED ORDER — EPHEDRINE SULFATE 50 MG/ML IJ SOLN
INTRAMUSCULAR | Status: DC | PRN
  Administered 2019-12-20: 10 mg via INTRAVENOUS

## 2019-12-20 MED ORDER — PROPOFOL 10 MG/ML IV BOLUS
INTRAVENOUS | Status: AC
  Filled 2019-12-20: qty 20

## 2019-12-20 MED ORDER — CHLORHEXIDINE GLUCONATE 4 % EX LIQD
CUTANEOUS | Status: AC
  Administered 2019-12-20: 4
  Filled 2019-12-20: qty 15

## 2019-12-20 MED ORDER — DOCUSATE SODIUM 100 MG PO CAPS
100.0000 mg | ORAL_CAPSULE | Freq: Two times a day (BID) | ORAL | Status: DC
  Administered 2019-12-20 – 2019-12-22 (×5): 100 mg via ORAL
  Filled 2019-12-20 (×5): qty 1

## 2019-12-20 SURGICAL SUPPLY — 31 items
BLADE SURG 21 STRL SS (BLADE) ×3 IMPLANT
BNDG COHESIVE 4X5 TAN STRL (GAUZE/BANDAGES/DRESSINGS) ×3 IMPLANT
BNDG ESMARK 4X9 LF (GAUZE/BANDAGES/DRESSINGS) IMPLANT
BNDG GAUZE ELAST 4 BULKY (GAUZE/BANDAGES/DRESSINGS) ×1 IMPLANT
COVER SURGICAL LIGHT HANDLE (MISCELLANEOUS) ×4 IMPLANT
COVER WAND RF STERILE (DRAPES) ×1 IMPLANT
DRAPE INCISE IOBAN 66X45 STRL (DRAPES) ×2 IMPLANT
DRAPE U-SHAPE 47X51 STRL (DRAPES) ×3 IMPLANT
DRSG ADAPTIC 3X8 NADH LF (GAUZE/BANDAGES/DRESSINGS) IMPLANT
DRSG PAD ABDOMINAL 8X10 ST (GAUZE/BANDAGES/DRESSINGS) ×1 IMPLANT
DURAPREP 26ML APPLICATOR (WOUND CARE) ×3 IMPLANT
ELECT REM PT RETURN 9FT ADLT (ELECTROSURGICAL) ×3
ELECTRODE REM PT RTRN 9FT ADLT (ELECTROSURGICAL) ×1 IMPLANT
GAUZE SPONGE 4X4 12PLY STRL (GAUZE/BANDAGES/DRESSINGS) IMPLANT
GLOVE BIOGEL PI IND STRL 9 (GLOVE) ×1 IMPLANT
GLOVE BIOGEL PI INDICATOR 9 (GLOVE) ×2
GLOVE SURG ORTHO 9.0 STRL STRW (GLOVE) ×3 IMPLANT
GOWN STRL REUS W/ TWL XL LVL3 (GOWN DISPOSABLE) ×2 IMPLANT
GOWN STRL REUS W/TWL XL LVL3 (GOWN DISPOSABLE) ×6
KIT BASIN OR (CUSTOM PROCEDURE TRAY) ×3 IMPLANT
KIT DRSG PREVENA PLUS 7DAY 125 (MISCELLANEOUS) ×2 IMPLANT
KIT PREVENA INCISION MGT 13 (CANNISTER) ×2 IMPLANT
KIT TURNOVER KIT B (KITS) ×3 IMPLANT
MANIFOLD NEPTUNE II (INSTRUMENTS) ×1 IMPLANT
NEEDLE 22X1 1/2 (OR ONLY) (NEEDLE) IMPLANT
NS IRRIG 1000ML POUR BTL (IV SOLUTION) ×3 IMPLANT
PACK ORTHO EXTREMITY (CUSTOM PROCEDURE TRAY) ×3 IMPLANT
PAD ARMBOARD 7.5X6 YLW CONV (MISCELLANEOUS) ×6 IMPLANT
SUT ETHILON 2 0 PSLX (SUTURE) ×5 IMPLANT
SYR CONTROL 10ML LL (SYRINGE) IMPLANT
TOWEL GREEN STERILE (TOWEL DISPOSABLE) ×3 IMPLANT

## 2019-12-20 NOTE — Anesthesia Procedure Notes (Signed)
Procedure Name: LMA Insertion Date/Time: 12/20/2019 12:18 PM Performed by: Renato Shin, CRNA Pre-anesthesia Checklist: Patient identified, Emergency Drugs available, Suction available and Patient being monitored Patient Re-evaluated:Patient Re-evaluated prior to induction Oxygen Delivery Method: Circle system utilized Preoxygenation: Pre-oxygenation with 100% oxygen Induction Type: IV induction LMA: LMA inserted LMA Size: 4.0 Number of attempts: 1 Placement Confirmation: positive ETCO2 and breath sounds checked- equal and bilateral Tube secured with: Tape Dental Injury: Teeth and Oropharynx as per pre-operative assessment

## 2019-12-20 NOTE — Progress Notes (Signed)
PROGRESS NOTE    ISOM LETTIERI  [id number] DOB: 09-17-1960 DOA: 12/19/2019 PCP: Cory Serene, MD    Brief Narrative:  Cory Marks is a 59 year old male with past medical history remarkable for CAD, neuropathy secondary to chemotherapy from mantle cell lymphoma currently in remission, HTN, HLD, CKD stage III who presents to the ED with 1 week history of right leg pain with purulent discharge from right fifth toe.  Patient reports initially started as a blister roughly 1 week ago and has been progressing since.  History of previous first/second toe amputation by Dr. Reynaldo Minium and Dr. Doran Durand over the past 3 to 5 years.  Patient complains of 8/10 pain in affected toe.  In the ED, afebrile, vital signs stable blood pressure 120s to 130s heart rate in 50s to 60s, 99% room air. Blood work with BUN 21 creatinine 1.4, normal CBC, lactic acid 1.0, blood culture were obtained, x-ray of the foot showed osteomyelitis of the right fifth middle and proximal phalanges.  Patient was given vancomycin IV and admission was requested.   Assessment & Plan:   Active Problems:   HTN (hypertension)   Lymphoma (HCC)   Hyperlipidemia   Foot ulcer, right (HCC)   CAD in native artery   CKD (chronic kidney disease) stage 3, GFR 30-59 ml/min   Osteomyelitis of fifth toe of right foot (HCC)   Osteomyelitis (HCC)   Cutaneous abscess of right foot   Osteomyelitis with abscess right fifth middle/proximal phalanges Patient presenting with progressive pain and drainage from right foot following recent blister formation.  Right foot x-ray notable for osteomyelitis fifth middle/proximal phalanx.  Patient underwent right foot transmetatarsal amputation with wound VAC placement by Dr. Sharol Given on 12/20/2019. --Vancomycin, Flagyl, cefepime --Pain control per orthopedics;, tramadol, Robaxin --Touchdown weightbearing on right lower extremity --PT/OT consultation  Essential hypertension --Continue amlodipine 10 mg p.o.  daily, lisinopril 5 mg p.o. daily  HLD: Continue atorvastatin 10 mg p.o. daily  Neuropathy: Continue gabapentin 600 mg p.o. 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 --prophylactic penicillin - hold while on iv antibiotics  CKD stage IIIa --BUN/creatinine stable, continue to monitor BMP closely while on IV antibiotics  CAD --Continue aspirin, statin   DVT prophylaxis: Lovenox Code Status: Full code Family Communication: Mother who was present at bedside this morning  Disposition Plan:  Status is: Inpatient  Remains inpatient appropriate because:Ongoing active pain requiring inpatient pain management, Ongoing diagnostic testing needed not appropriate for outpatient work up, Unsafe d/c plan and IV treatments appropriate due to intensity of illness or inability to take PO   Dispo: The patient is from: Home              Anticipated d/c is to: Home              Anticipated d/c date is: 1 day              Patient currently is not medically stable to d/c.   Consultants:   Orthopedics, Dr. Sharol Given  Procedures:   Right transmetatarsal amputation, 12/20/2019 - dr. Sharol Given  Antimicrobials:   Vancomycin 5/13>>  Cefepime 5/13>>  Flagyl 5/13>>  Perioperative cefazolin   Subjective: Patient seen and examined at bedside, resting comfortably.  Mother present.  Awaiting operative management this afternoon for underlying osteomyelitis.  No questions or concerns at this time.  Denies headache, no fever/chills/night sweats, no nausea/vomiting/diarrhea, no chest pain, no palpitations, no shortness of breath, no abdominal pain, no cough/congestion.  No acute events overnight per nursing staff.  Objective: Vitals:   12/20/19 1255 12/20/19 1310 12/20/19 1320 12/20/19 1337  BP: 116/89 122/82 104/80 109/77  Pulse: 60 (!) 53 (!) 53 62  Resp: 12 12 10 17   Temp: 98 F (36.7 C)   97.6 F (36.4 C)  TempSrc:    Oral  SpO2: 100% 97% 100% 96%  Weight:      Height:         Intake/Output Summary (Last 24 hours) at 12/20/2019 1343 Last data filed at 12/20/2019 1249 Gross per 24 hour  Intake 910 ml  Output 1825 ml  Net -915 ml   Filed Weights   12/19/19 0337 12/20/19 1125  Weight: 99.8 kg 99.8 kg    Examination:  General exam: Appears calm and comfortable  Respiratory system: Clear to auscultation. Respiratory effort normal. Cardiovascular system: S1 & S2 heard, RRR. No JVD, murmurs, rubs, gallops or clicks. No pedal edema. Gastrointestinal system: Abdomen is nondistended, soft and nontender. No organomegaly or masses felt. Normal bowel sounds heard. Central nervous system: Alert and oriented. No focal neurological deficits. Extremities: Symmetric 5 x 5 power. Skin: Right fifth toe region with purulent discharge, mild edema Psychiatry: Judgement and insight appear normal. Mood & affect appropriate.       Data Reviewed: I have personally reviewed following labs and imaging studies  CBC: Recent Labs  Lab 12/19/19 0519 12/20/19 0739  WBC 8.4 5.0  NEUTROABS 5.3  --   HGB 14.5 14.6  HCT 43.2 43.2  MCV 98.0 97.1  PLT 262 [id number]   Basic Metabolic Panel: Recent Labs  Lab 12/19/19 0519 12/20/19 0739  NA 137 137  K 4.0 3.9  CL 101 100  CO2 26 26  GLUCOSE 102* 83  BUN 21* 16  CREATININE 1.40* 1.23  CALCIUM 9.3 9.4   GFR: Estimated Creatinine Clearance: 79.1 mL/min (by C-G formula based on SCr of 1.23 mg/dL). Liver Function Tests: Recent Labs  Lab 12/19/19 0519 12/20/19 0739  AST 15 15  ALT 15 15  ALKPHOS 42 36*  BILITOT 0.8 0.8  PROT 6.3* 5.6*  ALBUMIN 3.7 3.1*   No results for input(s): LIPASE, AMYLASE in the last 168 hours. No results for input(s): AMMONIA in the last 168 hours. Coagulation Profile: No results for input(s): INR, PROTIME in the last 168 hours. Cardiac Enzymes: No results for input(s): CKTOTAL, CKMB, CKMBINDEX, TROPONINI in the last 168 hours. BNP (last 3 results) No results for input(s): PROBNP in the  last 8760 hours. HbA1C: No results for input(s): HGBA1C in the last 72 hours. CBG: Recent Labs  Lab 12/20/19 1255  GLUCAP 96   Lipid Profile: No results for input(s): CHOL, HDL, LDLCALC, TRIG, CHOLHDL, LDLDIRECT in the last 72 hours. Thyroid Function Tests: No results for input(s): TSH, T4TOTAL, FREET4, T3FREE, THYROIDAB in the last 72 hours. Anemia Panel: No results for input(s): VITAMINB12, FOLATE, FERRITIN, TIBC, IRON, RETICCTPCT in the last 72 hours. Sepsis Labs: Recent Labs  Lab 12/19/19 0520 12/19/19 0908  LATICACIDVEN 1.0 0.8    Recent Results (from the past 240 hour(s))  SARS Coronavirus 2 by RT PCR (hospital order, performed in [facility] hospital lab) Nasopharyngeal Nasopharyngeal Swab     Status: None   Collection Time: 12/19/19  9:10 AM   Specimen: Nasopharyngeal Swab  Result Value Ref Range Status   SARS Coronavirus 2 NEGATIVE NEGATIVE Final    Comment: (NOTE) SARS-CoV-2 target nucleic acids are NOT DETECTED. The SARS-CoV-2 RNA is generally detectable in  upper and lower respiratory specimens during the acute phase of infection. The lowest concentration of SARS-CoV-2 viral copies this assay can detect is 250 copies / mL. A negative result does not preclude SARS-CoV-2 infection and should not be used as the sole basis for treatment or other patient management decisions.  A negative result may occur with improper specimen collection / handling, submission of specimen other than nasopharyngeal swab, presence of viral mutation(s) within the areas targeted by this assay, and inadequate number of viral copies (<250 copies / mL). A negative result must be combined with clinical observations, patient history, and epidemiological information. Fact Sheet for Patients:   StrictlyIdeas.no Fact Sheet for Healthcare Providers: BankingDealers.co.za This test is not yet approved or cleared  by the [place] FDA and has been  authorized for detection and/or diagnosis of SARS-CoV-2 by FDA under an Emergency Use Authorization (EUA).  This EUA will remain in effect (meaning this test can be used) for the duration of the COVID-19 declaration under Section 564(b)(1) of the Act, 21 U.S.C. section 360bbb-3(b)(1), unless the authorization is terminated or revoked sooner. Performed at [facility], [place] [address] [place], [place] [address]   Surgical pcr screen     Status: Abnormal   Collection Time: 12/19/19  9:20 PM   Specimen: Nasal Mucosa; Nasal Swab  Result Value Ref Range Status   MRSA, PCR POSITIVE (A) NEGATIVE Final    Comment: RESULT CALLED TO, READ BACK BY AND VERIFIED WITHBess Harvest RN [phone] 12/19/19 A BROWNING    Staphylococcus aureus POSITIVE (A) NEGATIVE Final    Comment: (NOTE) The Xpert SA Assay (FDA approved for NASAL specimens in patients 81 years of age and older), is one component of a comprehensive surveillance program. It is not intended to diagnose infection nor to guide or monitor treatment. Performed at [facility], [place] [address], [place], [place] [address]          Radiology Studies: DG Foot Complete Right  Result Date: 12/19/2019 CLINICAL DATA:  Fifth toe ulcer. EXAM: RIGHT FOOT COMPLETE - 3+ VIEW COMPARISON:  Right foot x-rays dated September 27, 2019. FINDINGS: Progressive bony destruction the fifth middle phalanx and fifth proximal phalanx head with overlying soft tissue ulceration and soft tissue swelling of the fifth toe. Prior amputation of the first and second toes. No acute fracture. Joint spaces are preserved. Plantar ulceration of the midfoot. Diffuse soft tissue swelling of the foot. IMPRESSION: New osteomyelitis of the fifth middle and proximal phalanges. Electronically Signed   By: Titus Dubin M.D.   On: 12/19/2019 05:55        Scheduled Meds: . [MAR Hold] amLODipine  10 mg Oral q morning - 10a  . [MAR Hold] aspirin EC  81 mg Oral  Daily  . [MAR Hold] atorvastatin  10 mg Oral Daily  . [MAR Hold] enoxaparin (LOVENOX) injection  40 mg Subcutaneous Q24H  . fentaNYL      . [MAR Hold] gabapentin  600 mg Oral BID  . [MAR Hold] lisinopril  5 mg Oral Daily  . [MAR Hold] multivitamin with minerals  1 tablet Oral Daily  . [MAR Hold] pyridOXINE  100 mg Oral Daily  . [MAR Hold] timolol  1 drop Left Eye BID   Continuous Infusions: . [MAR Hold] ceFEPime (MAXIPIME) IV 2 g (12/20/19 0920)  . [MAR Hold] metronidazole Stopped (12/20/19 1052)  . [MAR Hold] vancomycin 750 mg (12/20/19 0631)     LOS: 1 day  Time spent: 38 minutes spent on chart review, discussion with nursing staff, consultants, updating family and interview/physical exam; more than 50% of that time was spent in counseling and/or coordination of care.    Judia Arnott J [place] DO [facility] Available via Epic secure chat 7am-7pm After these hours, please refer to coverage provider listed on [email or web address] 12/20/2019, 1:43 PM

## 2019-12-20 NOTE — Transfer of Care (Signed)
Immediate Anesthesia Transfer of Care Note  Patient: Cory Marks  Procedure(s) Performed: RIGHT FOOT AMPUTATION TOES (Right )  Patient Location: PACU  Anesthesia Type:General  Level of Consciousness: awake, alert  and oriented  Airway & Oxygen Therapy: Patient Spontanous Breathing and Patient connected to nasal cannula oxygen  Post-op Assessment: Report given to RN and Post -op Vital signs reviewed and stable  Post vital signs: Reviewed and stable  Last Vitals:  Vitals Value Taken Time  BP 116/89 12/20/19 1255  Temp 36.7 C 12/20/19 1255  Pulse 64 12/20/19 1256  Resp 19 12/20/19 1256  SpO2 100 % 12/20/19 1256  Vitals shown include unvalidated device data.  Last Pain:  Vitals:   12/20/19 1255  TempSrc:   PainSc: 8          Complications: No apparent anesthesia complications

## 2019-12-20 NOTE — Progress Notes (Signed)
Pt requests that his niece be updated on his care and plan while here in the hospital.  jazmin hampton- [phone]

## 2019-12-20 NOTE — Interval H&P Note (Signed)
History and Physical Interval Note:  12/20/2019 6:43 AM  Cory Marks  has presented today for surgery, with the diagnosis of Osteomyelitis Right Foot.  The various methods of treatment have been discussed with the patient and family. After consideration of risks, benefits and other options for treatment, the patient has consented to  Procedure(s): RIGHT FOOT AMPUTATION TOES (Right) as a surgical intervention.  The patient's history has been reviewed, patient examined, no change in status, stable for surgery.  I have reviewed the patient's chart and labs.  Questions were answered to the patient's satisfaction.     Newt Minion

## 2019-12-20 NOTE — Op Note (Signed)
12/20/2019  12:58 PM  PATIENT:  Cory Marks    PRE-OPERATIVE DIAGNOSIS:  Osteomyelitis Right Foot  POST-OPERATIVE DIAGNOSIS: Osteomyelitis right little toe with dorsal abscess  PROCEDURE:  RIGHT FOOT transmetatarsal amputation. Application of Prevena wound VAC.  SURGEON:  Newt Minion, MD  PHYSICIAN ASSISTANT:None ANESTHESIA:   General  PREOPERATIVE INDICATIONS:  KRIMSON OCHSNER is a  59 y.o. male with a diagnosis of Osteomyelitis Right Foot who failed conservative measures and elected for surgical management.    The risks benefits and alternatives were discussed with the patient preoperatively including but not limited to the risks of infection, bleeding, nerve injury, cardiopulmonary complications, the need for revision surgery, among others, and the patient was willing to proceed.  OPERATIVE IMPLANTS: Praveena 13 cm wound VAC.  @ENCIMAGES @  OPERATIVE FINDINGS: Abscess from the fifth toe extended proximally along the extensor tendons dorsally.  OPERATIVE PROCEDURE: Patient was brought the operating room and underwent a general anesthetic.  After adequate levels anesthesia were obtained patient's right lower extremity was prepped using DuraPrep draped into a sterile field a timeout was called.  A fishmouth incision was made around toes 3 4 and 5 proximal to the ulceration.  The toes were amputated through the MTP joint.  Patient has an abscess that extended dorsally over the little toe and the extensor tendon was debrided and soft tissue excised with a rondure.  The wound was irrigated with normal saline electrocautery was used for hemostasis.  The incision was closed using 2-0 nylon.  A Prevena wound VAC was applied this had a good suction fit this was overwrapped with Covan   DISCHARGE PLANNING:  Antibiotic duration: Continue antibiotics for 24 hours  Weightbearing: Touchdown weightbearing on the right  Pain medication: Prescription for opioid pathway  Dressing care/  Wound VAC: Continue with wound VAC at discharge for 1 week.  Ambulatory devices: Walker or crutches  Discharge to: Anticipate discharge to home  Follow-up: In the office 1 week post operative.

## 2019-12-20 NOTE — Anesthesia Postprocedure Evaluation (Signed)
Anesthesia Post Note  Patient: Cory Marks  Procedure(s) Performed: RIGHT FOOT AMPUTATION TOES (Right )     Patient location during evaluation: PACU Anesthesia Type: General Level of consciousness: awake and alert Pain management: pain level controlled Vital Signs Assessment: post-procedure vital signs reviewed and stable Respiratory status: spontaneous breathing, nonlabored ventilation, respiratory function stable and patient connected to nasal cannula oxygen Cardiovascular status: blood pressure returned to baseline and stable Postop Assessment: no apparent nausea or vomiting Anesthetic complications: no    Last Vitals:  Vitals:   12/20/19 1320 12/20/19 1337  BP: 104/80 109/77  Pulse: (!) 53 62  Resp: 10 17  Temp:  36.4 C  SpO2: 100% 96%    Last Pain:  Vitals:   12/20/19 1337  TempSrc: Oral  PainSc:                  [name]

## 2019-12-20 NOTE — Anesthesia Preprocedure Evaluation (Signed)
Anesthesia Evaluation  Patient identified by MRN, date of birth, ID band Patient awake    Reviewed: NPO status , Patient's Chart, lab work & pertinent test results  Airway Mallampati: II  TM Distance: >3 FB Neck ROM: Full    Dental  (+) Teeth Intact, Dental Advisory Given   Pulmonary    breath sounds clear to auscultation       Cardiovascular hypertension,  Rhythm:Regular Rate:Normal     Neuro/Psych    GI/Hepatic   Endo/Other  diabetes  Renal/GU      Musculoskeletal   Abdominal   Peds  Hematology   Anesthesia Other Findings   Reproductive/Obstetrics                             Anesthesia Physical Anesthesia Plan  ASA: III  Anesthesia Plan: General   Post-op Pain Management:    Induction: Intravenous  PONV Risk Score and Plan:   Airway Management Planned: LMA  Additional Equipment:   Intra-op Plan:   Post-operative Plan:   Informed Consent: I have reviewed the patients History and Physical, chart, labs and discussed the procedure including the risks, benefits and alternatives for the proposed anesthesia with the patient or authorized representative who has indicated his/her understanding and acceptance.     Dental advisory given  Plan Discussed with: CRNA and Anesthesiologist  Anesthesia Plan Comments:         Anesthesia Quick Evaluation

## 2019-12-20 NOTE — Plan of Care (Signed)

## 2019-12-21 LAB — CBC
HCT: 42.4 % (ref 39.0–52.0)
Hemoglobin: 14.3 g/dL (ref 13.0–17.0)
MCH: 32.7 pg (ref 26.0–34.0)
MCHC: 33.7 g/dL (ref 30.0–36.0)
MCV: 97 fL (ref 80.0–100.0)
Platelets: 308 10*3/uL (ref 150–400)
RBC: 4.37 MIL/uL (ref 4.22–5.81)
RDW: 12.9 % (ref 11.5–15.5)
WBC: 11.3 10*3/uL — ABNORMAL HIGH (ref 4.0–10.5)
nRBC: 0 % (ref 0.0–0.2)

## 2019-12-21 LAB — BASIC METABOLIC PANEL
Anion gap: 10 (ref 5–15)
BUN: 25 mg/dL — ABNORMAL HIGH (ref 6–20)
CO2: 25 mmol/L (ref 22–32)
Calcium: 9.5 mg/dL (ref 8.9–10.3)
Chloride: 102 mmol/L (ref 98–111)
Creatinine, Ser: 1.25 mg/dL — ABNORMAL HIGH (ref 0.61–1.24)
GFR calc Af Amer: >60 mL/min (ref >60)
GFR calc non Af Amer: >60 mL/min (ref >60)
Glucose, Bld: 138 mg/dL — ABNORMAL HIGH (ref 70–99)
Potassium: 4.8 mmol/L (ref 3.5–5.1)
Sodium: 137 mmol/L (ref 135–145)

## 2019-12-21 LAB — MAGNESIUM: Magnesium: 1.8 mg/dL (ref 1.7–2.4)

## 2019-12-21 LAB — C-REACTIVE PROTEIN: CRP: 1.2 mg/dL — ABNORMAL HIGH (ref <1.0)

## 2019-12-21 MED ORDER — DIPHENHYDRAMINE HCL 25 MG PO CAPS
25.0000 mg | ORAL_CAPSULE | Freq: Four times a day (QID) | ORAL | Status: DC | PRN
  Administered 2019-12-21 (×3): 25 mg via ORAL
  Filled 2019-12-21 (×4): qty 1

## 2019-12-21 MED ORDER — OXYCODONE HCL 5 MG PO TABS
5.0000 mg | ORAL_TABLET | ORAL | Status: DC | PRN
  Filled 2019-12-21: qty 1

## 2019-12-21 MED ORDER — HYDROCODONE-ACETAMINOPHEN 5-325 MG PO TABS
1.0000 | ORAL_TABLET | ORAL | Status: DC | PRN
  Administered 2019-12-21 (×3): 2 via ORAL
  Filled 2019-12-21 (×3): qty 2

## 2019-12-21 MED ORDER — DIPHENHYDRAMINE HCL 25 MG PO CAPS
25.0000 mg | ORAL_CAPSULE | Freq: Once | ORAL | Status: AC
  Administered 2019-12-21: 25 mg via ORAL
  Filled 2019-12-21: qty 1

## 2019-12-21 NOTE — Evaluation (Signed)
Occupational Therapy Evaluation Patient Details Name: Cory Marks MRN: [id number] DOB: 04-20-1961 Today's Date: 12/21/2019    History of Present Illness Pt is a 58 yo male presenting with osteomyelitis of the R foot who is now s/p R TMA by Dr. Sharol Given on 5/14. PMH includes: previous R toe amputations, arthritis, CAD, Cance, CKD III, DM II, HTN, and neuropathy.   Clinical Impression   PTA, pt was living at home alone, pt reports he was independent with ADL/IADL and modified independent with functional mobility with spc. Pt currently requires minguard for functional mobility, he requires cues for adherence to WB precautions. Due to decline in current level of function, pt would benefit from acute OT to address established goals to facilitate safe D/C to venue listed below. At this time, recommend HHOT follow-up. Will continue to follow acutely.     Follow Up Recommendations  Home health OT;Supervision - Intermittent    Equipment Recommendations  3 in 1 bedside commode    Recommendations for Other Services       Precautions / Restrictions Precautions Precautions: Fall Precaution Comments: wound vac. pt reports multiple stumbles at home in past 6 months, unclear on falls Restrictions Weight Bearing Restrictions: Yes RLE Weight Bearing: Touchdown weight bearing      Mobility Bed Mobility Overal bed mobility: Modified Independent             General bed mobility comments: sitting in recliner upon arrival  Transfers Overall transfer level: Needs assistance Equipment used: Rolling walker (2 wheeled) Transfers: Sit to/from Stand Sit to Stand: Min guard         General transfer comment: cues for WB status, and minguard for safety and stability    Balance Overall balance assessment: Needs assistance Sitting-balance support: Single extremity supported;Feet supported Sitting balance-Leahy Scale: Good     Standing balance support: Single extremity supported;During  functional activity Standing balance-Leahy Scale: Poor Standing balance comment: at least single UE support in standing                           ADL either performed or assessed with clinical judgement   ADL Overall ADL's : Needs assistance/impaired Eating/Feeding: Set up;Sitting   Grooming: Minimal assistance;Standing Grooming Details (indicate cue type and reason): with at least single UE support Upper Body Bathing: Set up;Sitting   Lower Body Bathing: Min guard;Sit to/from stand   Upper Body Dressing : Set up;Sitting   Lower Body Dressing: Min guard;Sit to/from stand Lower Body Dressing Details (indicate cue type and reason): pt able to don his left shoe Toilet Transfer: Min guard;Ambulation Toilet Transfer Details (indicate cue type and reason): in room mobility, educated pt on importance of taking his time during mobility Toileting- Water quality scientist and Hygiene: Min guard;Sit to/from stand       Functional mobility during ADLs: Min guard;Rolling walker General ADL Comments: pt required min vc for adherence to TDWB precautions, educated pt on slowing pace of mobility down to increase safety and stability     Vision Baseline Vision/History: Wears glasses Wears Glasses: Distance only Patient Visual Report: No change from baseline Vision Assessment?: No apparent visual deficits     Perception     Praxis      Pertinent Vitals/Pain Pain Assessment: 0-10 Pain Score: 5  Pain Location: R foot/ amputation site Pain Descriptors / Indicators: Grimacing;Sore Pain Intervention(s): Limited activity within patient's tolerance;Monitored during session     Hand Dominance Right   Extremity/Trunk Assessment  Upper Extremity Assessment Upper Extremity Assessment: Overall WFL for tasks assessed   Lower Extremity Assessment Lower Extremity Assessment: Defer to PT evaluation RLE Deficits / Details: TDWB, but good functional strength in knee and hip   Cervical /  Trunk Assessment Cervical / Trunk Assessment: Kyphotic   Communication Communication Communication: No difficulties   Cognition Arousal/Alertness: Awake/alert Behavior During Therapy: WFL for tasks assessed/performed Overall Cognitive Status: No family/caregiver present to determine baseline cognitive functioning Area of Impairment: Safety/judgement                         Safety/Judgement: Decreased awareness of safety;Decreased awareness of deficits     General Comments: Slight decrease in safety awareness and need for safety precautions, cooperative when corrected   General Comments  vss;    Exercises Exercises: Other exercises Other Exercises Other Exercises: educated pt on desensitization strategies    Shoulder Instructions      Home Living Family/patient expects to be discharged to:: Private residence Living Arrangements: Alone(plans to stay with his mother after d/c) Available Help at Discharge: Family Type of Home: House Home Access: Stairs to enter Technical brewer of Steps: 3 Entrance Stairs-Rails: None Home Layout: Two level Alternate Level Stairs-Number of Steps: 14 Alternate Level Stairs-Rails: Right Bathroom Shower/Tub: Teacher, early years/pre: Standard     Home Equipment: Cane - single point;Walker - 2 wheels;Grab bars - tub/shower;Shower seat   Additional Comments: with 74 yo mother, she does the driving      Prior Functioning/Environment Level of Independence: Independent        Comments: pt reports 1 fall in past 3 months, pt admitted to not consistently telling staff about falls because he knows how it impacts our recommendations. pt reports he is able to get himself up after he falls        OT Problem List: Decreased strength;Decreased range of motion;Decreased activity tolerance;Impaired balance (sitting and/or standing);Decreased safety awareness;Decreased knowledge of precautions;Pain      OT  Treatment/Interventions: Self-care/ADL training;Therapeutic exercise;Energy conservation;DME and/or AE instruction;Therapeutic activities;Patient/family education;Balance training    OT Goals(Current goals can be found in the care plan section) Acute Rehab OT Goals Patient Stated Goal: return home OT Goal Formulation: With patient Time For Goal Achievement: 01/04/20 Potential to Achieve Goals: Good ADL Goals Pt Will Perform Grooming: with modified independence;standing Pt Will Transfer to Toilet: with modified independence;ambulating Pt Will Perform Tub/Shower Transfer: with modified independence;Shower transfer Additional ADL Goal #1: Pt will demonstrate independence with 3 fall prevention strategies during ADL and functional mobility.  OT Frequency: Min 2X/week   Barriers to D/C: Decreased caregiver support  pt will be staying with a family member, she is unable to provide physical assistance       Co-evaluation              AM-PAC OT "6 Clicks" Daily Activity     Outcome Measure Help from another person eating meals?: A Little Help from another person taking care of personal grooming?: A Little Help from another person toileting, which includes using toliet, bedpan, or urinal?: A Little Help from another person bathing (including washing, rinsing, drying)?: A Little Help from another person to put on and taking off regular upper body clothing?: A Little Help from another person to put on and taking off regular lower body clothing?: A Little 6 Click Score: 18   End of Session Equipment Utilized During Treatment: Gait belt;Rolling walker Nurse Communication: Mobility status  Activity  Tolerance: Patient tolerated treatment well Patient left: in chair;with call bell/phone within reach  OT Visit Diagnosis: Other abnormalities of gait and mobility (R26.89);Repeated falls (R29.6);History of falling (Z91.81);Muscle weakness (generalized) (M62.81);Pain Pain - Right/Left:  Right Pain - part of body: Ankle and joints of foot                Time: [id number] OT Time Calculation (min): 30 min Charges:  OT General Charges $OT Visit: 1 Visit OT Evaluation $OT Eval Moderate Complexity: 1 Mod OT Treatments $Self Care/Home Management : 8-22 mins  Helene Kelp OTR/L Acute Rehabilitation Services Office: [place] 12/21/2019, 12:56 PM

## 2019-12-21 NOTE — Progress Notes (Signed)
Subjective: 1 Day Post-Op Procedure(s) (LRB): RIGHT FOOT AMPUTATION TOES (Right) Patient reports pain as moderate.  Appears comfortable.   Objective: Vital signs in last 24 hours: Temp:  [97.6 F (36.4 C)-98.1 F (36.7 C)] 98 F (36.7 C) (05/15 0454) Pulse Rate:  [52-62] 52 (05/15 0454) Resp:  [10-18] 18 (05/15 0454) BP: (104-124)/(77-89) 124/78 (05/15 0454) SpO2:  [94 %-100 %] 94 % (05/15 0454) Weight:  [99.8 kg] 99.8 kg (05/14 1125)  Intake/Output from previous day: 05/14 0701 - 05/15 0700 In: 1805.2 [P.O.:480; I.V.:275.2; IV Piggyback:1050] Out: 1525 [Urine:1500; Blood:25] Intake/Output this shift: No intake/output data recorded.  Recent Labs    12/19/19 0519 12/20/19 0739 12/21/19 0425  HGB 14.5 14.6 14.3   Recent Labs    12/20/19 0739 12/21/19 0425  WBC 5.0 11.3*  RBC 4.45 4.37  HCT 43.2 42.4  PLT 282 308   Recent Labs    12/20/19 0739 12/21/19 0425  NA 137 137  K 3.9 4.8  CL 100 102  CO2 26 25  BUN 16 25*  CREATININE 1.23 1.25*  GLUCOSE 83 138*  CALCIUM 9.4 9.5   No results for input(s): LABPT, INR in the last 72 hours.   Right lower extremity: Compartment soft Dorsi plantar flexion right ankle intact Wound vac intact dressing clean and dry. Canister empty.     Assessment/Plan: 1 Day Post-Op Procedure(s) (LRB): RIGHT FOOT AMPUTATION TOES (Right) Up with therapy  Touchdown weight bearing right lower extremity Follow up with Dr. Sharol Given in 1 week      [facility] 12/21/2019, 7:55 AM

## 2019-12-21 NOTE — Plan of Care (Signed)
  Problem: Activity: Goal: Risk for activity intolerance will decrease Outcome: Progressing   Problem: Coping: Goal: Level of anxiety will decrease Outcome: Progressing   Problem: Pain Managment: Goal: General experience of comfort will improve Outcome: Progressing   Problem: Safety: Goal: Ability to remain free from injury will improve Outcome: Progressing   Problem: Skin Integrity: Goal: Risk for impaired skin integrity will decrease Outcome: Progressing   

## 2019-12-21 NOTE — Plan of Care (Signed)

## 2019-12-21 NOTE — TOC Progression Note (Signed)
Transition of Care [facility]) - Progression Note    Patient Details  Name: Cory Marks MRN: [id number] Date of Birth: 26-May-1961  Transition of Care [facility]) CM/SW [place], [place] Phone Number: [phone] 12/21/2019, 3:02 PM  Clinical Narrative:     CSW spoke with Narda Rutherford at [facility] and she stated that family would not be able to come and complete paperwork until tomorrow.  CSW is still awaiting authorization.   TOC team will continue to assist with discharge planning needs.      Expected Discharge Plan and Services                                                 Social Determinants of Health (SDOH) Interventions    Readmission Risk Interventions No flowsheet data found.

## 2019-12-21 NOTE — Evaluation (Signed)
Physical Therapy Evaluation Patient Details Name: Cory Marks MRN: [id number] DOB: 1961-01-03 Today's Date: 12/21/2019   History of Present Illness  Pt is a 59 yo male presenting with osteomyelitis of the R foot who is now s/p R TMA by Dr. Sharol Given on 5/14. PMH includes: previous R toe amputations, arthritis, CAD, Cance, CKD III, DM II, HTN, and neuropathy.  Clinical Impression  Pt in bed upon arrival of PT, agreeable to evaluation at this time. Prior to admission the pt was independent with ambulation, but has canes and RW from prior surgeries and recoveries. The pt plans to stay with his mother following d/c where he will have 3 STE with 14 steps to reach kitchen/living room area. The pt now presents with limitations in functional mobility, power, endurance, and stbaility due to above dx, and will continue to benefit from skilled PT to address these deficits. The pt was able to demo good bed mobility, but remains limited in transfers and ambulation due to poor functional balance and strength to safely mobilize while maintaining TDWB of RLE. The pt will continue to benefit from skilled PT to further progress safety and independence with mobility as well as complete stair training prior to d/c home.      Follow Up Recommendations Home health PT;Supervision for mobility/OOB    Equipment Recommendations  (pt has RW)    Recommendations for Other Services       Precautions / Restrictions Precautions Precautions: Fall Precaution Comments: wound vac. pt reports multiple stumbles at home in past 6 months, unclear on falls Restrictions Weight Bearing Restrictions: Yes RLE Weight Bearing: Touchdown weight bearing      Mobility  Bed Mobility Overal bed mobility: Modified Independent             General bed mobility comments: pt able to come to EOB without assist, pt able to manage lines well without assist  Transfers Overall transfer level: Needs assistance Equipment used: Rolling  walker (2 wheeled) Transfers: Sit to/from Stand Sit to Stand: Min guard         General transfer comment: no assist needed to rise from EOB or lower to recliner, minG for safety. VC for hand placement with each trial and heavy VC for WB status as pt seems to put sizeable amount of wt through RLE despite continued cues  Ambulation/Gait Ambulation/Gait assistance: Min guard Gait Distance (Feet): 45 Feet Assistive device: Rolling walker (2 wheeled) Gait Pattern/deviations: Trunk flexed Gait velocity: decreased Gait velocity interpretation: <1.31 ft/sec, indicative of household ambulator General Gait Details: hop-to gait pattern with use of NWB due to pt difficutly with maintaining only TD WB during session. Pt able to maintain NWB with sig support through BUE on RW  Stairs Stairs: (not today, pt has 3 STE)          Wheelchair Mobility    Modified Rankin (Stroke Patients Only)       Balance Overall balance assessment: Needs assistance Sitting-balance support: Single extremity supported;Feet supported Sitting balance-Leahy Scale: Good       Standing balance-Leahy Scale: Poor Standing balance comment: BUE support on RW to maintain TDWB                             Pertinent Vitals/Pain Pain Assessment: 0-10 Pain Score: 5  Pain Location: R foot/ amputation site Pain Descriptors / Indicators: Grimacing;Sore Pain Intervention(s): Limited activity within patient's tolerance;Monitored during session;Repositioned    Home Living Family/patient expects  to be discharged to:: Private residence Living Arrangements: Alone(plans to stay with his mother after d/c) Available Help at Discharge: Family Type of Home: House Home Access: Stairs to enter Entrance Stairs-Rails: None Entrance Stairs-Number of Steps: 3 Home Layout: Two level Home Equipment: Cane - single point;Walker - 2 wheels;Grab bars - tub/shower;Shower seat Additional Comments: with 30 yo mother, she  does the driving    Prior Function Level of Independence: Independent         Comments: reports no falls, reports multiple stumbles in last 6 mo     Hand Dominance   Dominant Hand: Right    Extremity/Trunk Assessment   Upper Extremity Assessment Upper Extremity Assessment: Overall WFL for tasks assessed    Lower Extremity Assessment Lower Extremity Assessment: Overall WFL for tasks assessed;RLE deficits/detail RLE Deficits / Details: TDWB, but good functional strength in knee and hip    Cervical / Trunk Assessment Cervical / Trunk Assessment: Kyphotic  Communication   Communication: No difficulties  Cognition Arousal/Alertness: Awake/alert Behavior During Therapy: WFL for tasks assessed/performed Overall Cognitive Status: Within Functional Limits for tasks assessed Area of Impairment: Safety/judgement                         Safety/Judgement: Decreased awareness of safety;Decreased awareness of deficits     General Comments: Slight decrease in safety awareness and need for safety precautions, cooperative when corrected      General Comments General comments (skin integrity, edema, etc.): VSS, pt with difficult time with TDWB so used NWB RLE instead.    Exercises     Assessment/Plan    PT Assessment Patient needs continued PT services  PT Problem List Decreased strength;Decreased mobility;Decreased safety awareness;Decreased coordination;Decreased activity tolerance;Decreased balance;Decreased knowledge of use of DME;Impaired sensation       PT Treatment Interventions DME instruction;Therapeutic exercise;Gait training;Stair training;Functional mobility training;Therapeutic activities;Patient/family education;Balance training    PT Goals (Current goals can be found in the Care Plan section)  Acute Rehab PT Goals Patient Stated Goal: return home PT Goal Formulation: With patient Time For Goal Achievement: 01/04/20 Potential to Achieve Goals:  Good    Frequency Min 4X/week   Barriers to discharge   pt has 3 STE without rail    Co-evaluation               AM-PAC PT "6 Clicks" Mobility  Outcome Measure Help needed turning from your back to your side while in a flat bed without using bedrails?: None Help needed moving from lying on your back to sitting on the side of a flat bed without using bedrails?: None Help needed moving to and from a bed to a chair (including a wheelchair)?: A Little Help needed standing up from a chair using your arms (e.g., wheelchair or bedside chair)?: A Little Help needed to walk in hospital room?: A Little Help needed climbing 3-5 steps with a railing? : A Lot 6 Click Score: 19    End of Session Equipment Utilized During Treatment: Gait belt Activity Tolerance: Patient tolerated treatment well Patient left: in chair;with call bell/phone within reach;with family/visitor present Nurse Communication: Mobility status PT Visit Diagnosis: Difficulty in walking, not elsewhere classified (R26.2);Repeated falls (R29.6)    Time: [id number] PT Time Calculation (min) (ACUTE ONLY): 36 min   Charges:   PT Evaluation $PT Eval Moderate Complexity: 1 Mod PT Treatments $Gait Training: 8-22 mins        Karma Ganja, PT, DPT   Acute Rehabilitation  Department Pager #: [phone]  Otho Bellows 12/21/2019, 11:14 AM

## 2019-12-21 NOTE — Progress Notes (Signed)
PROGRESS NOTE    Cory Marks  [id number] DOB: 1960-09-22 DOA: 12/19/2019 PCP: [name], MD    Brief Narrative:  Cory Marks is a 59 year old male with past medical history remarkable for CAD, neuropathy secondary to chemotherapy from mantle cell lymphoma currently in remission, HTN, HLD, CKD stage III who presents to the ED with 1 week history of right leg pain with purulent discharge from right fifth toe.  Patient reports initially started as a blister roughly 1 week ago and has been progressing since.  History of previous first/second toe amputation by Dr. Reynaldo Minium and Dr. Doran Durand over the past 3 to 5 years.  Patient complains of 8/10 pain in affected toe.  In the ED, afebrile, vital signs stable blood pressure 120s to 130s heart rate in 50s to 60s, 99% room air. Blood work with BUN 21 creatinine 1.4, normal CBC, lactic acid 1.0, blood culture were obtained, x-ray of the foot showed osteomyelitis of the right fifth middle and proximal phalanges.  Patient was given vancomycin IV and admission was requested.   Assessment & Plan:   Active Problems:   HTN (hypertension)   Lymphoma (HCC)   Hyperlipidemia   Foot ulcer, right (HCC)   CAD in native artery   CKD (chronic kidney disease) stage 3, GFR 30-59 ml/min   Osteomyelitis of fifth toe of right foot (HCC)   Osteomyelitis (HCC)   Cutaneous abscess of right foot   Osteomyelitis with abscess right fifth middle/proximal phalanges Patient presenting with progressive pain and drainage from right foot following recent blister formation.  Right foot x-ray notable for osteomyelitis fifth middle/proximal phalanx.  Patient underwent right foot transmetatarsal amputation with wound VAC placement by Dr. Sharol Given on 12/20/2019. --Vancomycin, Flagyl, cefepime; continue 24h post-op then dc --Pain control per orthopedics; hydrocodone, Robaxin --Touchdown weightbearing on right lower extremity --PT recommends home health, patient already has RW  at home --Outpatient follow-up with Dr. Sharol Given 1 week  Essential hypertension --Continue amlodipine 10 mg p.o. daily, lisinopril 5 mg p.o. daily  HLD: Continue atorvastatin 10 mg p.o. daily  Neuropathy: Continue gabapentin 600 mg p.o. 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 --prophylactic penicillin - hold while on iv antibiotics  CKD stage IIIa --BUN/creatinine stable, continue to monitor BMP closely while on IV antibiotics  CAD --Continue aspirin, statin   DVT prophylaxis: Lovenox Code Status: Full code Family Communication: Family present at bedside this morning  Disposition Plan:  Status is: Inpatient  Remains inpatient appropriate because:Ongoing active pain requiring inpatient pain management, Ongoing diagnostic testing needed not appropriate for outpatient work up, Unsafe d/c plan and IV treatments appropriate due to intensity of illness or inability to take PO   Dispo: The patient is from: Home              Anticipated d/c is to: Home with home health PT              Anticipated d/c date is: 1 day              Patient currently is not medically stable to d/c.   Consultants:   Orthopedics, Dr. Sharol Given  Procedures:   Right transmetatarsal amputation, 12/20/2019 - Dr. Sharol Given  Antimicrobials:   Vancomycin 5/13>>  Cefepime 5/13>>  Flagyl 5/13>>  Perioperative cefazolin   Subjective: Patient seen and examined at bedside, resting comfortably.  Family present.  States tramadol not sufficient for pain control, was changed to hydrocodone.  Also complains of itching  requesting Benadryl.  No other questions or concerns at this time.  Denies headache, no fever/chills/night sweats, no nausea/vomiting/diarrhea, no chest pain, no palpitations, no shortness of breath, no abdominal pain, no cough/congestion.  No acute events overnight per nursing staff.  Objective: Vitals:   12/20/19 1337 12/20/19 2042 12/21/19 0454 12/21/19 0850  BP: 109/77  107/77 124/78 125/71  Pulse: 62 60 (!) 52 (!) 48  Resp: 17 18 18    Temp: 97.6 F (36.4 C) 98.1 F (36.7 C) 98 F (36.7 C) 98.3 F (36.8 C)  TempSrc: Oral Oral Oral Oral  SpO2: 96% 94% 94% 99%  Weight:      Height:        Intake/Output Summary (Last 24 hours) at 12/21/2019 1126 Last data filed at 12/21/2019 0850 Gross per 24 hour  Intake 1805.21 ml  Output 1725 ml  Net 80.21 ml   Filed Weights   12/19/19 0337 12/20/19 1125  Weight: 99.8 kg 99.8 kg    Examination:  General exam: Appears calm and comfortable  Respiratory system: Clear to auscultation. Respiratory effort normal. Cardiovascular system: S1 & S2 heard, RRR. No JVD, murmurs, rubs, gallops or clicks. No pedal edema. Gastrointestinal system: Abdomen is nondistended, soft and nontender. No organomegaly or masses felt. Normal bowel sounds heard. Central nervous system: Alert and oriented. No focal neurological deficits. Extremities: Symmetric 5 x 5 power. Skin: Right foot with wound VAC in place Psychiatry: Judgement and insight appear normal. Mood & affect appropriate.       Data Reviewed: I have personally reviewed following labs and imaging studies  CBC: Recent Labs  Lab 12/19/19 0519 12/20/19 0739 12/21/19 0425  WBC 8.4 5.0 11.3*  NEUTROABS 5.3  --   --   HGB 14.5 14.6 14.3  HCT 43.2 43.2 42.4  MCV 98.0 97.1 97.0  PLT 262 282 [id number]   Basic Metabolic Panel: Recent Labs  Lab 12/19/19 0519 12/20/19 0739 12/21/19 0425  NA 137 137 137  K 4.0 3.9 4.8  CL 101 100 102  CO2 26 26 25   GLUCOSE 102* 83 138*  BUN 21* 16 25*  CREATININE 1.40* 1.23 1.25*  CALCIUM 9.3 9.4 9.5  MG  --   --  1.8   GFR: Estimated Creatinine Clearance: 77.9 mL/min (A) (by C-G formula based on SCr of 1.25 mg/dL (H)). Liver Function Tests: Recent Labs  Lab 12/19/19 0519 12/20/19 0739  AST 15 15  ALT 15 15  ALKPHOS 42 36*  BILITOT 0.8 0.8  PROT 6.3* 5.6*  ALBUMIN 3.7 3.1*   No results for input(s): LIPASE, AMYLASE  in the last 168 hours. No results for input(s): AMMONIA in the last 168 hours. Coagulation Profile: No results for input(s): INR, PROTIME in the last 168 hours. Cardiac Enzymes: No results for input(s): CKTOTAL, CKMB, CKMBINDEX, TROPONINI in the last 168 hours. BNP (last 3 results) No results for input(s): PROBNP in the last 8760 hours. HbA1C: No results for input(s): HGBA1C in the last 72 hours. CBG: Recent Labs  Lab 12/20/19 1255  GLUCAP 96   Lipid Profile: No results for input(s): CHOL, HDL, LDLCALC, TRIG, CHOLHDL, LDLDIRECT in the last 72 hours. Thyroid Function Tests: No results for input(s): TSH, T4TOTAL, FREET4, T3FREE, THYROIDAB in the last 72 hours. Anemia Panel: No results for input(s): VITAMINB12, FOLATE, FERRITIN, TIBC, IRON, RETICCTPCT in the last 72 hours. Sepsis Labs: Recent Labs  Lab 12/19/19 0520 12/19/19 0908  LATICACIDVEN 1.0 0.8    Recent Results (from the past 240 hour(s))  Culture, blood (routine x 2)     Status: None (Preliminary result)   Collection Time: 12/19/19  5:10 AM   Specimen: BLOOD  Result Value Ref Range Status   Specimen Description   Final    BLOOD LEFT ANTECUBITAL Performed at [facility] [address] [place], [place] [address]    Special Requests   Final    BOTTLES DRAWN AEROBIC AND ANAEROBIC Blood Culture adequate volume Performed at [facility] [address] [place], [place] [address]    Culture   Final    NO GROWTH 2 DAYS Performed at [facility] [address], [place], [place] [address]    Report Status PENDING  Incomplete  Culture, blood (routine x 2)     Status: None (Preliminary result)   Collection Time: 12/19/19  5:21 AM   Specimen: BLOOD RIGHT ARM  Result Value Ref Range Status   Specimen Description   Final    BLOOD RIGHT ARM Performed at [facility] [address] [place], [place] [address]    Special Requests   Final    BOTTLES DRAWN  AEROBIC AND ANAEROBIC Blood Culture results may not be optimal due to an excessive volume of blood received in culture bottles Performed at [facility] [address], [place], [place] [address]    Culture   Final    NO GROWTH 2 DAYS Performed at [facility] [address] [place], [place] [address]    Report Status PENDING  Incomplete  SARS Coronavirus 2 by RT PCR (hospital order, performed in [facility] hospital lab) Nasopharyngeal Nasopharyngeal Swab     Status: None   Collection Time: 12/19/19  9:10 AM   Specimen: Nasopharyngeal Swab  Result Value Ref Range Status   SARS Coronavirus 2 NEGATIVE NEGATIVE Final    Comment: (NOTE) SARS-CoV-2 target nucleic acids are NOT DETECTED. The SARS-CoV-2 RNA is generally detectable in upper and lower respiratory specimens during the acute phase of infection. The lowest concentration of SARS-CoV-2 viral copies this assay can detect is 250 copies / mL. A negative result does not preclude SARS-CoV-2 infection and should not be used as the sole basis for treatment or other patient management decisions.  A negative result may occur with improper specimen collection / handling, submission of specimen other than nasopharyngeal swab, presence of viral mutation(s) within the areas targeted by this assay, and inadequate number of viral copies (<250 copies / mL). A negative result must be combined with clinical observations, patient history, and epidemiological information. Fact Sheet for Patients:   StrictlyIdeas.no Fact Sheet for Healthcare Providers: BankingDealers.co.za This test is not yet approved or cleared  by the [place] FDA and has been authorized for detection and/or diagnosis of SARS-CoV-2 by FDA under an Emergency Use Authorization (EUA).  This EUA will remain in effect (meaning this test can be used) for the duration of the COVID-19 declaration under Section  564(b)(1) of the Act, 21 U.S.C. section 360bbb-3(b)(1), unless the authorization is terminated or revoked sooner. Performed at [facility], [place] [address] [place], [place] [address]   Surgical pcr screen     Status: Abnormal   Collection Time: 12/19/19  9:20 PM   Specimen: Nasal Mucosa; Nasal Swab  Result Value Ref Range Status   MRSA, PCR POSITIVE (A) NEGATIVE Final    Comment: RESULT CALLED TO, READ BACK BY AND VERIFIED WITHBess Harvest RN [phone] 12/19/19 A BROWNING  Staphylococcus aureus POSITIVE (A) NEGATIVE Final    Comment: (NOTE) The Xpert SA Assay (FDA approved for NASAL specimens in patients 86 years of age and older), is one component of a comprehensive surveillance program. It is not intended to diagnose infection nor to guide or monitor treatment. Performed at [facility], [place] [address] [place], [place] [address]       Radiology Studies: No results found.      Scheduled Meds: . amLODipine  10 mg Oral q morning - 10a  . aspirin EC  81 mg Oral Daily  . atorvastatin  10 mg Oral Daily  . docusate sodium  100 mg Oral BID  . enoxaparin (LOVENOX) injection  40 mg Subcutaneous Q24H  . gabapentin  600 mg Oral BID  . lisinopril  5 mg Oral Daily  . multivitamin with minerals  1 tablet Oral Daily  . pyridOXINE  100 mg Oral Daily  . timolol  1 drop Left Eye BID  . traMADol  50 mg Oral Q6H   Continuous Infusions: . sodium chloride 75 mL/hr at 12/21/19 0736  . ceFEPime (MAXIPIME) IV 2 g (12/21/19 1018)  . methocarbamol (ROBAXIN) IV    . metronidazole 500 mg (12/21/19 1122)  . vancomycin 750 mg (12/21/19 0552)     LOS: 2 days    Time spent: 35 minutes spent on chart review, discussion with nursing staff, consultants, updating family and interview/physical exam; more than 50% of that time was spent in counseling and/or coordination of care.    Felice Hope J [place] DO [facility] Available via Epic secure chat 7am-7pm After  these hours, please refer to coverage provider listed on [email or web address] 12/21/2019, 11:26 AM

## 2019-12-22 MED ORDER — HYDROMORPHONE HCL 2 MG PO TABS: 1.0000 mg | ORAL_TABLET | Freq: Four times a day (QID) | ORAL | 0 refills | Status: AC | PRN

## 2019-12-22 MED ORDER — LORATADINE 10 MG PO TABS
10.0000 mg | ORAL_TABLET | Freq: Every day | ORAL | Status: DC | PRN
  Administered 2019-12-22: 10 mg via ORAL
  Filled 2019-12-22: qty 1

## 2019-12-22 MED ORDER — LORATADINE 10 MG PO TABS: 10.0000 mg | ORAL_TABLET | Freq: Every day | ORAL | Status: DC

## 2019-12-22 MED ORDER — ASPIRIN 81 MG PO TBEC: 81.0000 mg | DELAYED_RELEASE_TABLET | Freq: Every day | ORAL | Status: DC

## 2019-12-22 NOTE — Discharge Summary (Signed)
Physician Discharge Summary  Cory Marks [id number] DOB: October 22, 1960 DOA: 12/19/2019  PCP: [name], MD  Admit date: 12/19/2019 Discharge date: 12/22/2019  Admitted From: Home Disposition: Home  Recommendations for Outpatient Follow-up:  1. Follow up with PCP in 1-2 weeks 2. Follow-up with orthopedics, Dr. Sharol Given in 1 week  Home Health: PT Equipment/Devices: Prevena wound VAC, patient already has walker at home  Discharge Condition: Stable CODE STATUS: Full code Diet recommendation: Heart healthy diet  History of present illness:  Cory Marks is a 59 year old male with past medical history remarkable for CAD, neuropathy secondary to chemotherapy from mantle cell lymphoma currently in remission, HTN, HLD, CKD stage III who presents to the ED with 1 week history of right leg pain with purulent discharge from right fifth toe.  Patient reports initially started as a blister roughly 1 week ago and has been progressing since.  History of previous first/second toe amputation by Dr. Reynaldo Minium and Dr. Doran Durand over the past 3 to 5 years.  Patient complains of 8/10 pain in affected toe.  In the ED,afebrile, vital signs stable blood pressure 120s to 130s heart rate in 50s to 60s, 99% room air. Blood work with BUN 21 creatinine 1.4,normal CBC, lactic acid 1.0, blood culture were obtained, x-ray of the foot showed osteomyelitis of the right fifth middle and proximal phalanges.Patient was given vancomycin IV and admission was requested.  Hospital course:  Osteomyelitis with abscess right fifth middle/proximal phalanges Patient presenting with progressive pain and drainage from right foot following recent blister formation.  Right foot x-ray notable for osteomyelitis fifth middle/proximal phalanx.  Patient underwent right foot transmetatarsal amputation with wound VAC placement by Dr. Sharol Given on 12/20/2019.  Patient completed IV antibiotics with vancomycin, Flagyl, cefepime 24 hours  postoperatively and which was discontinued.  Patient is touchdown weightbearing on right lower extremity with use of a walker.  Patient will discharge with wound VAC in place with follow-up with Dr. Sharol Given in 1 week postoperatively.  Patient will discharge with Dilaudid 1 mg every 6 hours as needed for further pain control as he has tolerated this medication in the past and has significant itching with oxycodone/hydrocodone.  Essential hypertension Continue amlodipine 10 mg p.o. daily, lisinopril 5 mg p.o. daily.  Continue aspirin and statin  HLD: Continue atorvastatin 10 mg p.o. daily  Neuropathy: Continue gabapentin 600 mg p.o. twice daily  Mantle cell lymphomaof spleen: s/p spleenecomty in 2011, continue prophylactic penicillin  CKD stage IIIa BUN/creatinine stable  CAD Continue aspirin, statin  Discharge Diagnoses:  Active Problems:   HTN (hypertension)   Lymphoma (HCC)   Hyperlipidemia   Foot ulcer, right (HCC)   CAD in native artery   CKD (chronic kidney disease) stage 3, GFR 30-59 ml/min   Osteomyelitis of fifth toe of right foot (HCC)   Osteomyelitis (HCC)   Cutaneous abscess of right foot    Discharge Instructions  Discharge Instructions    Call MD for:  difficulty breathing, headache or visual disturbances   Complete by: As directed    Call MD for:  extreme fatigue   Complete by: As directed    Call MD for:  persistant dizziness or light-headedness   Complete by: As directed    Call MD for:  persistant nausea and vomiting   Complete by: As directed    Call MD for:  severe uncontrolled pain   Complete by: As directed    Call MD for:  temperature >100.4   Complete by: As directed  Diet - low sodium heart healthy   Complete by: As directed    Increase activity slowly   Complete by: As directed    Negative Pressure Wound Therapy - Incisional   Complete by: As directed    Show patient how to attach prevena vac     Allergies as of 12/22/2019       Reactions   Oxycodone Itching, Palpitations      Medication List    TAKE these medications   acetaminophen 325 MG tablet Commonly known as: TYLENOL Take 650-975 mg by mouth every 6 (six) hours as needed for mild pain or headache.   amLODipine 10 MG tablet Commonly known as: NORVASC Take 1 tablet (10 mg total) by mouth every morning.   atorvastatin 10 MG tablet Commonly known as: LIPITOR Take 10 mg by mouth daily.   diclofenac Sodium 1 % Gel Commonly known as: VOLTAREN Apply 2 g topically 4 (four) times daily as needed (pain).   ELDERBERRY PO Take 1 tablet by mouth daily.   gabapentin 300 MG capsule Commonly known as: NEURONTIN Take 600 mg by mouth 2 (two) times daily.   HYDROmorphone 2 MG tablet Commonly known as: Dilaudid Take 0.5 tablets (1 mg total) by mouth every 6 (six) hours as needed for up to 7 days for severe pain.   lisinopril 5 MG tablet Commonly known as: ZESTRIL Take 5 mg by mouth daily.   multivitamin with minerals Tabs tablet Take 1 tablet by mouth daily.   penicillin v potassium 500 MG tablet Commonly known as: VEETID Take 500 mg by mouth 2 (two) times daily.   pyridOXINE 100 MG tablet Commonly known as: VITAMIN B-6 Take 100 mg by mouth daily.   timolol 0.5 % ophthalmic solution Commonly known as: TIMOPTIC Place 1 drop into the left eye 2 (two) times daily.      Follow-up Information    Persons, [place], [place] In 1 week.   Specialty: Orthopedic Surgery Contact information: [place] [address] [phone]        [name], MD. Schedule an appointment as soon as possible for a visit in 1 week(s).   Specialty: Internal Medicine Contact information: [place] [address] [phone]          Allergies  Allergen Reactions  . Oxycodone Itching and Palpitations    Consultations:  Orthopedics, Dr. Sharol Given   Procedures/Studies: DG Foot Complete Right  Result Date: 12/19/2019 CLINICAL  DATA:  Fifth toe ulcer. EXAM: RIGHT FOOT COMPLETE - 3+ VIEW COMPARISON:  Right foot x-rays dated September 27, 2019. FINDINGS: Progressive bony destruction the fifth middle phalanx and fifth proximal phalanx head with overlying soft tissue ulceration and soft tissue swelling of the fifth toe. Prior amputation of the first and second toes. No acute fracture. Joint spaces are preserved. Plantar ulceration of the midfoot. Diffuse soft tissue swelling of the foot. IMPRESSION: New osteomyelitis of the fifth middle and proximal phalanges. Electronically Signed   By: Titus Dubin M.D.   On: 12/19/2019 05:55      Subjective: Patient seen and examined bedside, resting comfortably.  States the oxycodone and hydrocodone make him itch.  He reports he did tolerate Dilaudid in the past without any adverse side effects.  Orthopedics okay for discharge home with outpatient follow-up.  No other questions or concerns at this time.  Denies headache, no chest pain, no palpitations, no shortness of breath, no abdominal pain, no cough/congestion, no fever/chills/night sweats, no nausea/vomiting/diarrhea,  no weakness, no fatigue.  No acute events overnight per nursing staff.  Discharge Exam: Vitals:   12/22/19 0407 12/22/19 0715  BP: 132/89 130/85  Pulse: (!) 41 60  Resp: 16 16  Temp: 97.7 F (36.5 C) 98 F (36.7 C)  SpO2: 95% 100%   Vitals:   12/21/19 1647 12/21/19 1917 12/22/19 0407 12/22/19 0715  BP: 121/87 121/81 132/89 130/85  Pulse: (!) 47 (!) 46 (!) 41 60  Resp: 18 16 16 16   Temp: 98 F (36.7 C) 98 F (36.7 C) 97.7 F (36.5 C) 98 F (36.7 C)  TempSrc:  Oral Oral Oral  SpO2: 100% 94% 95% 100%  Weight:      Height:        General: Pt is alert, awake, not in acute distress Cardiovascular: RRR, S1/S2 +, no rubs, no gallops Respiratory: CTA bilaterally, no wheezing, no rhonchi Abdominal: Soft, NT, ND, bowel sounds + Extremities: no edema, no cyanosis, wound VAC noted in place right  foot    The results of significant diagnostics from this hospitalization (including imaging, microbiology, ancillary and laboratory) are listed below for reference.     Microbiology: Recent Results (from the past 240 hour(s))  Culture, blood (routine x 2)     Status: None (Preliminary result)   Collection Time: 12/19/19  5:10 AM   Specimen: BLOOD  Result Value Ref Range Status   Specimen Description   Final    BLOOD LEFT ANTECUBITAL Performed at [facility] [address], [place], [place] [address]    Special Requests   Final    BOTTLES DRAWN AEROBIC AND ANAEROBIC Blood Culture adequate volume Performed at [facility] [address] [place], [place] [address]    Culture   Final    NO GROWTH 3 DAYS Performed at [facility], [place] [address], [place], [place] [address]    Report Status PENDING  Incomplete  Culture, blood (routine x 2)     Status: None (Preliminary result)   Collection Time: 12/19/19  5:21 AM   Specimen: BLOOD RIGHT ARM  Result Value Ref Range Status   Specimen Description   Final    BLOOD RIGHT ARM Performed at [facility] [address] [place], [place] [address]    Special Requests   Final    BOTTLES DRAWN AEROBIC AND ANAEROBIC Blood Culture results may not be optimal due to an excessive volume of blood received in culture bottles Performed at [facility] [address] [place], [place] [address]    Culture   Final    NO GROWTH 3 DAYS Performed at [facility], [place] [address] [place], [place] [address]    Report Status PENDING  Incomplete  SARS Coronavirus 2 by RT PCR (hospital order, performed in [facility] hospital lab) Nasopharyngeal Nasopharyngeal Swab     Status: None   Collection Time: 12/19/19  9:10 AM   Specimen: Nasopharyngeal Swab  Result Value Ref Range Status   SARS Coronavirus 2 NEGATIVE NEGATIVE Final    Comment: (NOTE) SARS-CoV-2 target  nucleic acids are NOT DETECTED. The SARS-CoV-2 RNA is generally detectable in upper and lower respiratory specimens during the acute phase of infection. The lowest concentration of SARS-CoV-2 viral copies this assay can detect is 250 copies / mL. A negative result does not preclude SARS-CoV-2 infection and should not be used as the sole basis for treatment or other patient management decisions.  A negative result  may occur with improper specimen collection / handling, submission of specimen other than nasopharyngeal swab, presence of viral mutation(s) within the areas targeted by this assay, and inadequate number of viral copies (<250 copies / mL). A negative result must be combined with clinical observations, patient history, and epidemiological information. Fact Sheet for Patients:   StrictlyIdeas.no Fact Sheet for Healthcare Providers: BankingDealers.co.za This test is not yet approved or cleared  by the [place] FDA and has been authorized for detection and/or diagnosis of SARS-CoV-2 by FDA under an Emergency Use Authorization (EUA).  This EUA will remain in effect (meaning this test can be used) for the duration of the COVID-19 declaration under Section 564(b)(1) of the Act, 21 U.S.C. section 360bbb-3(b)(1), unless the authorization is terminated or revoked sooner. Performed at [facility], [place] [address] [place], [place] [address]   Surgical pcr screen     Status: Abnormal   Collection Time: 12/19/19  9:20 PM   Specimen: Nasal Mucosa; Nasal Swab  Result Value Ref Range Status   MRSA, PCR POSITIVE (A) NEGATIVE Final    Comment: RESULT CALLED TO, READ BACK BY AND VERIFIED WITHBess Harvest RN [phone] 12/19/19 A BROWNING    Staphylococcus aureus POSITIVE (A) NEGATIVE Final    Comment: (NOTE) The Xpert SA Assay (FDA approved for NASAL specimens in patients 64 years of age and older), is one component of a  comprehensive surveillance program. It is not intended to diagnose infection nor to guide or monitor treatment. Performed at [facility], [place] [address] [place], [place] [address]      Labs: BNP (last 3 results) No results for input(s): BNP in the last 8760 hours. Basic Metabolic Panel: Recent Labs  Lab 12/19/19 0519 12/20/19 0739 12/21/19 0425  NA 137 137 137  K 4.0 3.9 4.8  CL 101 100 102  CO2 26 26 25   GLUCOSE 102* 83 138*  BUN 21* 16 25*  CREATININE 1.40* 1.23 1.25*  CALCIUM 9.3 9.4 9.5  MG  --   --  1.8   Liver Function Tests: Recent Labs  Lab 12/19/19 0519 12/20/19 0739  AST 15 15  ALT 15 15  ALKPHOS 42 36*  BILITOT 0.8 0.8  PROT 6.3* 5.6*  ALBUMIN 3.7 3.1*   No results for input(s): LIPASE, AMYLASE in the last 168 hours. No results for input(s): AMMONIA in the last 168 hours. CBC: Recent Labs  Lab 12/19/19 0519 12/20/19 0739 12/21/19 0425  WBC 8.4 5.0 11.3*  NEUTROABS 5.3  --   --   HGB 14.5 14.6 14.3  HCT 43.2 43.2 42.4  MCV 98.0 97.1 97.0  PLT 262 282 308   Cardiac Enzymes: No results for input(s): CKTOTAL, CKMB, CKMBINDEX, TROPONINI in the last 168 hours. BNP: Invalid input(s): POCBNP CBG: Recent Labs  Lab 12/20/19 1255  GLUCAP 96   D-Dimer No results for input(s): DDIMER in the last 72 hours. Hgb A1c No results for input(s): HGBA1C in the last 72 hours. Lipid Profile No results for input(s): CHOL, HDL, LDLCALC, TRIG, CHOLHDL, LDLDIRECT in the last 72 hours. Thyroid function studies No results for input(s): TSH, T4TOTAL, T3FREE, THYROIDAB in the last 72 hours.  Invalid input(s): FREET3 Anemia work up No results for input(s): VITAMINB12, FOLATE, FERRITIN, TIBC, IRON, RETICCTPCT in the last 72 hours. Urinalysis    Component Value Date/Time   COLORURINE YELLOW 12/19/2019 [place] 12/19/2019 0624   APPEARANCEUR Clear 05/18/2018 0941   LABSPEC 1.018 12/19/2019  [place] 6.0 12/19/2019 0624    GLUCOSEU NEGATIVE 12/19/2019 0624   HGBUR NEGATIVE 12/19/2019 0624   BILIRUBINUR NEGATIVE 12/19/2019 0624   BILIRUBINUR Negative 05/18/2018 0941   KETONESUR NEGATIVE 12/19/2019 0624   PROTEINUR NEGATIVE 12/19/2019 0624   UROBILINOGEN 0.2 02/15/2018 0939   UROBILINOGEN 0.2 08/17/2017 0901   NITRITE NEGATIVE 12/19/2019 0624   LEUKOCYTESUR NEGATIVE 12/19/2019 0624   Sepsis Labs Invalid input(s): PROCALCITONIN,  WBC,  LACTICIDVEN Microbiology Recent Results (from the past 240 hour(s))  Culture, blood (routine x 2)     Status: None (Preliminary result)   Collection Time: 12/19/19  5:10 AM   Specimen: BLOOD  Result Value Ref Range Status   Specimen Description   Final    BLOOD LEFT ANTECUBITAL Performed at [facility], [place] [address], [place], [place] [address]    Special Requests   Final    BOTTLES DRAWN AEROBIC AND ANAEROBIC Blood Culture adequate volume Performed at [facility] [address] [place], [place] [address]    Culture   Final    NO GROWTH 3 DAYS Performed at [facility], [place] [address] [place], [place] [address]    Report Status PENDING  Incomplete  Culture, blood (routine x 2)     Status: None (Preliminary result)   Collection Time: 12/19/19  5:21 AM   Specimen: BLOOD RIGHT ARM  Result Value Ref Range Status   Specimen Description   Final    BLOOD RIGHT ARM Performed at [facility] [address], [place], [place] [address]    Special Requests   Final    BOTTLES DRAWN AEROBIC AND ANAEROBIC Blood Culture results may not be optimal due to an excessive volume of blood received in culture bottles Performed at [facility] [address], [place], [place] [address]    Culture   Final    NO GROWTH 3 DAYS Performed at [facility], [place] [address], [place],  [address]    Report Status PENDING  Incomplete  SARS Coronavirus 2 by RT PCR (hospital order, performed in  [facility] hospital lab) Nasopharyngeal Nasopharyngeal Swab     Status: None   Collection Time: 12/19/19  9:10 AM   Specimen: Nasopharyngeal Swab  Result Value Ref Range Status   SARS Coronavirus 2 NEGATIVE NEGATIVE Final    Comment: (NOTE) SARS-CoV-2 target nucleic acids are NOT DETECTED. The SARS-CoV-2 RNA is generally detectable in upper and lower respiratory specimens during the acute phase of infection. The lowest concentration of SARS-CoV-2 viral copies this assay can detect is 250 copies / mL. A negative result does not preclude SARS-CoV-2 infection and should not be used as the sole basis for treatment or other patient management decisions.  A negative result may occur with improper specimen collection / handling, submission of specimen other than nasopharyngeal swab, presence of viral mutation(s) within the areas targeted by this assay, and inadequate number of viral copies (<250 copies / mL). A negative result must be combined with clinical observations, patient history, and epidemiological information. Fact Sheet for Patients:   StrictlyIdeas.no Fact Sheet for Healthcare Providers: BankingDealers.co.za This test is not yet approved or cleared  by the [place] FDA and has been authorized for detection and/or diagnosis of SARS-CoV-2 by FDA under an Emergency Use Authorization (EUA).  This EUA will remain in effect (meaning this test can be used) for the duration of the COVID-19 declaration under Section 564(b)(1) of  the Act, 21 U.S.C. section 360bbb-3(b)(1), unless the authorization is terminated or revoked sooner. Performed at [facility], [place] [address], [place], [place] [address]   Surgical pcr screen     Status: Abnormal   Collection Time: 12/19/19  9:20 PM   Specimen: Nasal Mucosa; Nasal Swab  Result Value Ref Range Status   MRSA, PCR POSITIVE (A) NEGATIVE Final    Comment: RESULT CALLED TO,  READ BACK BY AND VERIFIED WITHBess Harvest RN [phone] 12/19/19 A BROWNING    Staphylococcus aureus POSITIVE (A) NEGATIVE Final    Comment: (NOTE) The Xpert SA Assay (FDA approved for NASAL specimens in patients 35 years of age and older), is one component of a comprehensive surveillance program. It is not intended to diagnose infection nor to guide or monitor treatment. Performed at [facility], [place] [address], [place], [place] [address]      Time coordinating discharge: Over 30 minutes  SIGNED:   Keundra Petrucelli J [place] DO  [facility] 12/22/2019, 10:14 AM

## 2019-12-22 NOTE — Discharge Instructions (Signed)
Osteomyelitis, Adult  Bone infections (osteomyelitis) occur when bacteria or other germs get inside a bone. This can happen if you have an infection in another part of your body that spreads through your blood. Germs from your skin or from outside of your body can also cause this type of infection if you have a wound or a broken bone (fracture) that breaks the skin. Bone infections need to be treated quickly to prevent bone damage and to prevent the infection from spreading to other areas of your body. What are the causes? Most bone infections are caused by bacteria. They can also be caused by other germs, such as viruses and funguses. What increases the risk? You are more likely to develop this condition if you:  Recently had surgery, especially bone or joint surgery.  Have a long-term (chronic) disease, such as: ? Diabetes. ? HIV (human immunodeficiency virus). ? Rheumatoid arthritis. ? Sickle cell anemia. ? Kidney disease that requires dialysis.  Are aged 60 years or older.  Have a condition or take medicines that block or weaken your body's defense system (immune system).  Have a condition that reduces your blood flow.  Have an artificial joint.  Have had a joint or bone repaired with plates or screws (surgical hardware).  Use IV drugs.  Have a central line for IV access.  Have had trauma, such as stepping on a nail or a broken bone that came through the skin. What are the signs or symptoms? Symptoms vary depending on the type and location of your infection. Common symptoms of bone infections include:  Fever and chills.  Skin redness and warmth.  Swelling.  Pain and stiffness.  Drainage of fluid or pus near the infection. How is this diagnosed? This condition may be diagnosed based on:  Your symptoms and medical history.  A physical exam.  Tests, such as: ? A sample of tissue, fluid, or blood taken to be examined under a microscope. ? Pus or discharge swabbed  from a wound for testing to identify germs and to determine what type of medicine will kill them (culture and sensitivity). ? Blood tests.  Imaging studies. These may include: ? X-rays. ? MRI. ? CT scan. ? Bone scan. ? Ultrasound. How is this treated? Treatment for this condition depends on the cause and type of infection. Antibiotic medicines are usually the first treatment for a bone infection. This may be done in a hospital at first. You may have to continue IV antibiotics at home or take antibiotics by mouth for several weeks after that. Other treatments may include surgery to remove:  Dead or dying tissue from a bone.  An infected artificial joint.  Infected plates or screws that were used to repair a broken bone. Follow these instructions at home: Medicines   Take over-the-counter and prescription medicines only as told by your health care provider.  Take your antibiotic medicine as told by your health care provider. Do not stop taking the antibiotic even if you start to feel better.  Follow instructions from your health care provider about how to take IV antibiotics at home. You may need to have a nurse come to your home to give you the IV antibiotics. General instructions   Ask your health care provider if you have any restrictions on your activities.  If directed, put ice on the affected area: ? Put ice in a plastic bag. ? Place a towel between your skin and the bag. ? Leave the ice on for 20   minutes, 2-3 times a day.  Wash your hands often with soap and water. If soap and water are not available, use hand sanitizer.  Do not use any products that contain nicotine or tobacco, such as cigarettes and e-cigarettes. These can delay bone healing. If you need help quitting, ask your health care provider.  Keep all follow-up visits as told by your health care provider. This is important. Contact a health care provider if:  You develop a fever or chills.  You have  redness, warmth, pain, or swelling that returns after treatment. Get help right away if:  You have rapid breathing or you have trouble breathing.  You have chest pain.  You cannot drink fluids or make urine.  The affected area swells, changes color, or turns blue.  You have numbness or severe pain in the affected area. Summary  Bone infections (osteomyelitis) occur when bacteria or other germs get inside a bone.  You may be more likely to get this type of infection if you have a condition, such as diabetes, that lowers your ability to fight infection or increases your chances of getting an infection.  Most bone infections are caused by bacteria. They can also be caused by other germs, such as viruses and funguses.  Treatment for this condition usually starts with taking antibiotics. Further treatment depends on the cause and type of infection. This information is not intended to replace advice given to you by your health care provider. Make sure you discuss any questions you have with your health care provider. Document Revised: 08/10/2017 Document Reviewed: 08/03/2017 Elsevier Patient Education  2020 [employer]  

## 2019-12-22 NOTE — TOC Transition Note (Addendum)
Transition of Care [facility]) - CM/SW Discharge Note   Patient Details  Name: Cory Marks MRN: [id number] Date of Birth: 09-25-1960  Transition of [facility] CM/SW Contact:  Claudie Leach, RN 12/22/2019, 10:39 AM   Clinical Narrative:    Patient to d/c today. He states he was never considering SNF and plans to go to mother's home at [address] , [place], [phone].  He received [place] services previously with [facility] and would like to use again.  [place] declines referral.  Referral accepted by [facility].   Patient was borrowing mother's walker, but would like a new one.  He has a 3n1.  RW ordered from [employer] to be delivered to room prior to d/c.   Update:  RW was not delivered.  Patient given information to pick up from store tomorrow.   Final next level of care: [place] Barriers to Discharge: No Barriers Identified   Patient Goals and CMS Choice Patient states their goals for this hospitalization and ongoing recovery are:: to get home CMS Medicare.gov Compare Post Acute Care list provided to:: Patient Choice offered to / list presented to : Patient   Discharge Plan and Services                DME Arranged: Walker rolling DME Agency: [employer] Date DME Agency Contacted: 12/22/19 Time DME Agency Contacted: [id number] Representative spoke with at DME Agency: [place]: PT  [place] agency : Alvis Lemmings Date [place]: 12/22/19 Time [place]: 1039 Representative spoke with at [place]: Alwyn Ren

## 2019-12-22 NOTE — Progress Notes (Addendum)
Occupational Therapy Treatment Patient Details Name: Cory Marks MRN: [id number] DOB: 1961-07-10 Today's Date: 12/22/2019    History of present illness Pt is a 59 yo male presenting with osteomyelitis of the R foot who is now s/p R TMA by Dr. Sharol Given on 5/14. PMH includes: previous R toe amputations, arthritis, CAD, Cance, CKD III, DM II, HTN, and neuropathy.   OT comments  Pt. Seen for skilled OT treatment session.  Able to complete bed mobility MOD I.  Ambulation to/from b.room for grooming tasks min guard a for all and maintained wbs along with safe controlled pace with rw.  Eager for d/c home.  States mother is available to assist.    Follow Up Recommendations  Home health OT;Supervision - Intermittent    Equipment Recommendations  3 in 1 bedside commode    Recommendations for Other Services      Precautions / Restrictions Precautions Precautions: Fall Precaution Comments: wound vac. pt reports multiple stumbles at home in past 6 months, unclear on falls Restrictions RLE Weight Bearing: Touchdown weight bearing       Mobility Bed Mobility Overal bed mobility: Modified Independent                Transfers Overall transfer level: Needs assistance Equipment used: Rolling walker (2 wheeled) Transfers: Sit to/from [facility] Sit to Stand: Min guard Stand pivot transfers: Min guard       General transfer comment: cues for hand placement when transitioning from standing to sitting as he kept R hand on rw and noted the rw was beginning to tip as he was sitting.  reviewed the concern and pt. was able to implement the correction    Balance                                           ADL either performed or assessed with clinical judgement   ADL Overall ADL's : Needs assistance/impaired     Grooming: Wash/dry hands;Wash/dry face;Oral care;Set up;Sitting Grooming Details (indicate cue type and reason): pt. requested sitting without  cues as an active attempt for energy conservation and fall prevention strategies Upper Body Bathing: Set up;Sitting   Lower Body Bathing: Min guard;Sit to/from stand           Toilet Transfer: Min guard;Ambulation Toilet Transfer Details (indicate cue type and reason): simulated during ambulation from bed to b.room for grooming at sink tasks Toileting- Water quality scientist and Hygiene: Min guard;Sit to/from stand Toileting - Clothing Manipulation Details (indicate cue type and reason): observed during lb bathing sit/stand at sink     Functional mobility during ADLs: Min guard;Rolling walker General ADL Comments: pt. with noted improvement from previous doc. maintaing a nwb approach to RLE vs. TDWB with great compliance during all ambulation.  also noted a slower pace.  he states he has "really been trying" to slow down the pace for safety.     Vision       Perception     Praxis      Cognition Arousal/Alertness: Awake/alert Behavior During Therapy: WFL for tasks assessed/performed Overall Cognitive Status: No family/caregiver present to determine baseline cognitive functioning                                          Exercises  Shoulder Instructions       General Comments      Pertinent Vitals/ Pain       Pain Assessment: No/denies pain.  Did report some initial dizziness with bed mobility.  States he           feels that way sometimes if he knows he eats too much sodium.  Educated on "getting his           bearings" before he transitions into standing or attempts ambulation.  Reviewed not trying to "push           through" and always sit and reassess when needed for safety.  He verbalized understanding.    Home Living                                          Prior Functioning/Environment              Frequency  Min 2X/week        Progress Toward Goals  OT Goals(current goals can now be found in the care plan  section)  Progress towards OT goals: Progressing toward goals     Plan      Co-evaluation                 AM-PAC OT "6 Clicks" Daily Activity     Outcome Measure   Help from another person eating meals?: A Little Help from another person taking care of personal grooming?: A Little Help from another person toileting, which includes using toliet, bedpan, or urinal?: A Little Help from another person bathing (including washing, rinsing, drying)?: A Little Help from another person to put on and taking off regular upper body clothing?: A Little Help from another person to put on and taking off regular lower body clothing?: A Little 6 Click Score: 18    End of Session Equipment Utilized During Treatment: Gait belt;Rolling walker  OT Visit Diagnosis: Other abnormalities of gait and mobility (R26.89);Repeated falls (R29.6);History of falling (Z91.81);Muscle weakness (generalized) (M62.81);Pain Pain - Right/Left: Right Pain - part of body: Ankle and joints of foot   Activity Tolerance Patient tolerated treatment well   Patient Left in chair;with call bell/phone within reach   Nurse Communication          Time: [id number] OT Time Calculation (min): 30 min  Charges: OT General Charges $OT Visit: 1 Visit OT Treatments $Self Care/Home Management : 23-37 mins  Sonia Baller, COTA/L Acute Rehabilitation [phone]   Janice Coffin 12/22/2019, 10:51 AM

## 2019-12-22 NOTE — Progress Notes (Signed)
Pt is ambulating with walker. NWB to RLE maint. Pain is controlled. Discharge instructions to pt. Discharged to home accompanied by his mother. Instructed pt on Prevena wound vac care.

## 2019-12-22 NOTE — Progress Notes (Addendum)
Patient ID: Cory Marks, male   DOB: 1960/08/29, 59 y.o.   MRN: [id number] Patient is status post transmetatarsal amputation postoperative day 2.  Patient complains of itching from the oxycodone.  The Percocet was discontinued and Vicodin ordered..  Patient can try the Ultram if he has itching from the Vicodin.  Discussed that patient can be discharged once he is safe with physical therapy I will follow-up in the office in 1 week.  Continue portable wound VAC at discharge.

## 2019-12-22 NOTE — Progress Notes (Signed)
Physical Therapy Treatment Patient Details Name: Cory Marks MRN: [id number] DOB: Jul 27, 1961 Today's Date: 12/22/2019    History of Present Illness Pt is a 59 yo male presenting with osteomyelitis of the R foot who is now s/p R TMA by Dr. Sharol Given on 5/14. PMH includes: previous R toe amputations, arthritis, CAD, Cance, CKD III, DM II, HTN, and neuropathy.    PT Comments    Pt OOB in recliner upon arrival of PT, agreeable to PT session with focus on progression of ambulation and stair training today in anticipation of d/c home. The pt was able to progress ambulation distance significantly, with improved stability and fluidity of movement with supervision for safety. The pt was also able to complete stair training with minA and BUE support to maintain WB status in preparation for d/c home. The pt will continue to benefit from skilled PT to further progress functional independence and endurance with ambulation and stair navigation to improve safety in the home.     Follow Up Recommendations  Home health PT;Supervision for mobility/OOB     Equipment Recommendations  (pt has RW)    Recommendations for Other Services       Precautions / Restrictions Precautions Precautions: Fall Precaution Comments: wound vac. pt reports multiple stumbles at home in past 6 months, unclear on falls Restrictions Weight Bearing Restrictions: Yes RLE Weight Bearing: Touchdown weight bearing    Mobility  Bed Mobility Overal bed mobility: Modified Independent             General bed mobility comments: sitting in recliner upon arrival  Transfers Overall transfer level: Needs assistance Equipment used: Rolling walker (2 wheeled) Transfers: Sit to/from Stand Sit to Stand: Supervision Stand pivot transfers: Min guard       General transfer comment: supervision for safety, pt with significant improvement in maintaining TDWB or NWB RLE today, no VC given for hands or WB, transfer x5 through  session  Ambulation/Gait Ambulation/Gait assistance: Min guard Gait Distance (Feet): 75 Feet Assistive device: Rolling walker (2 wheeled) Gait Pattern/deviations: Trunk flexed Gait velocity: decreased Gait velocity interpretation: 1.31 - 2.62 ft/sec, indicative of limited community ambulator General Gait Details: hop-to gait pattern with use of NWB. Pt able to maintain NWB with sig support through BUE on RW, progressed to rolling RW between hops instead of picking it up each time with cues   Stairs Stairs: Yes Stairs assistance: Min assist Stair Management: One rail Right;With walker;Backwards;Step to pattern Number of Stairs: 2 General stair comments: pt able to hop backward onto stair with BUE suport (one rail, one RW)   Wheelchair Mobility    Modified Rankin (Stroke Patients Only)       Balance Overall balance assessment: Needs assistance Sitting-balance support: Single extremity supported;Feet supported       Standing balance support: Single extremity supported;During functional activity   Standing balance comment: at least single UE support in standing                            Cognition Arousal/Alertness: Awake/alert Behavior During Therapy: WFL for tasks assessed/performed Overall Cognitive Status: Within Functional Limits for tasks assessed                                 General Comments: Slight decrease in safety awareness and need for safety precautions, cooperative when corrected      Exercises  General Comments General comments (skin integrity, edema, etc.): VSS      Pertinent Vitals/Pain Pain Assessment: Faces Faces Pain Scale: Hurts a little bit Pain Location: R foot/ amputation site Pain Descriptors / Indicators: Grimacing;Sore Pain Intervention(s): Limited activity within patient's tolerance;Repositioned    Home Living                      Prior Function            PT Goals (current goals can  now be found in the care plan section) Acute Rehab PT Goals Patient Stated Goal: return home PT Goal Formulation: With patient Time For Goal Achievement: 01/04/20 Potential to Achieve Goals: Good Progress towards PT goals: Progressing toward goals    Frequency    Min 4X/week      PT Plan Current plan remains appropriate    Co-evaluation              AM-PAC PT "6 Clicks" Mobility   Outcome Measure  Help needed turning from your back to your side while in a flat bed without using bedrails?: None Help needed moving from lying on your back to sitting on the side of a flat bed without using bedrails?: None Help needed moving to and from a bed to a chair (including a wheelchair)?: A Little Help needed standing up from a chair using your arms (e.g., wheelchair or bedside chair)?: None Help needed to walk in hospital room?: A Little Help needed climbing 3-5 steps with a railing? : A Little 6 Click Score: 21    End of Session Equipment Utilized During Treatment: Gait belt Activity Tolerance: Patient tolerated treatment well Patient left: in chair;with call bell/phone within reach Nurse Communication: Mobility status PT Visit Diagnosis: Difficulty in walking, not elsewhere classified (R26.2);Repeated falls (R29.6)     Time: [id number] PT Time Calculation (min) (ACUTE ONLY): 23 min  Charges:  $Gait Training: 23-37 mins                     Karma Ganja, PT, DPT   Acute Rehabilitation Department Pager #: [phone]   Otho Bellows 12/22/2019, 12:01 PM

## 2019-12-24 ENCOUNTER — Telehealth: Payer: Self-pay

## 2019-12-24 LAB — CULTURE, BLOOD (ROUTINE X 2)
Culture: NO GROWTH
Culture: NO GROWTH
Special Requests: ADEQUATE

## 2019-12-24 NOTE — Telephone Encounter (Signed)
[name] with [facility] home health called stating that patient needs a proper fitting rolling walker (41ft.tall), a light weight wheelchair (220lbs.) and patient is not able to maintain touchdown WB.   Would like orders for 2 x week for 3 wks, and 1 x week for 6 wks. Evaluation for occupational therapy for ADL, and medical social worker for community resources. Stated that patient has a wound vac and would like to know if Dr. Sharol Given would like a nurse involved in care. CB# [phone].  Please advise.  Thank you.

## 2019-12-25 ENCOUNTER — Telehealth: Payer: Self-pay | Admitting: Orthopedic Surgery

## 2019-12-25 NOTE — Telephone Encounter (Signed)
Message sent to Andree Coss with [employer] to see if [facility] will cover both the wheelchair and the walker. Will hold and await return message.

## 2019-12-25 NOTE — Telephone Encounter (Signed)
I called and sw Jim and gave verbal ok for PT orders as requested, ok for OT eval and social work eval. Advised that the pt has an appt tomorrow at 2pm and we will remove the wound vac in the office and let him know if there is a skilled need for nursing after tomorrows visit. Also let him know that insurance will only cover one or the other wheelchair or walker and the pt has opted to go with the walker. The pt advised me that the equipment had already been approved through [employer] and that at the time he did not have the out of pocket expense available but he does now and that he will call to get this set up and delivered and if he has any questions or needs assistance that he can call me and let me know . Will hold this message pending visit tomorrow and the possible need for nursing.

## 2019-12-25 NOTE — Telephone Encounter (Signed)
Patient called requesting to speak to Cory Marks. Patient states he has a couple of questions. Please call patient back at [phone].

## 2019-12-25 NOTE — Telephone Encounter (Signed)
Patient is calling to get the status of the RX for the [place] and the light weight wheelchair.  Pt's CB#[phone].  Thank you.

## 2019-12-26 ENCOUNTER — Encounter: Payer: Self-pay | Admitting: Orthopedic Surgery

## 2019-12-26 ENCOUNTER — Other Ambulatory Visit: Payer: Self-pay

## 2019-12-26 ENCOUNTER — Ambulatory Visit (INDEPENDENT_AMBULATORY_CARE_PROVIDER_SITE_OTHER): Payer: Medicare HMO | Admitting: Physician Assistant

## 2019-12-26 ENCOUNTER — Telehealth: Payer: Self-pay

## 2019-12-26 VITALS — Ht 72.0 in | Wt 220.0 lb

## 2019-12-26 DIAGNOSIS — M86071 Acute hematogenous osteomyelitis, right ankle and foot: Secondary | ICD-10-CM

## 2019-12-26 NOTE — Progress Notes (Signed)
Office Visit Note   Patient: Cory Marks           Date of Birth: 03/10/1961           MRN: [id number] Visit Date: 12/26/2019              Requested by: [name], MD [place],  [place] [address] PCP: [name], MD  Chief Complaint  Patient presents with  . Right Foot - Routine Post Op    12/20/19 right transmet amputation       HPI: Patient is 1 week status post right foot transmetatarsal amputation.  He is here for wound VAC removal.  Assessment & Plan: Visit Diagnoses: No diagnosis found.  Plan: Wound VAC was removed without difficulty today we will follow up in 1 week may do Dial cleansing daily.  Dry dressing applied patient is requesting a light weight wheelchair.  He is status post shoulder replacement and has advanced arthrosis in his wrist.  This makes it difficult for him to use a walker or a full sized wheelchair  Follow-Up Instructions: No follow-ups on file.   Ortho Exam  Patient is alert, oriented, no adenopathy, well-dressed, normal affect, normal respiratory effort. Right foot VAC was removed he has some skin maceration and skin shedding.  No surrounding cellulitis wound is intact no foul odor bloody drainage  Imaging: No results found. No images are attached to the encounter.  Labs: Lab Results  Component Value Date   HGBA1C 5.9 (H) 01/28/2019   HGBA1C 5.8 11/16/2017   HGBA1C 5.7 11/03/2016   ESRSEDRATE 8 12/19/2019   CRP 1.2 (H) 12/21/2019   CRP 1.5 (H) 12/20/2019   LABURIC 9.0 (H) 12/08/2010   LABURIC 10.2 (H) 12/02/2010   LABURIC 10.7 (H) 12/01/2010   REPTSTATUS 12/24/2019 FINAL 12/19/2019   CULT  12/19/2019    NO GROWTH 5 DAYS Performed at [facility], [place] [address] [place],  [address]      Lab Results  Component Value Date   ALBUMIN 3.1 (L) 12/20/2019   ALBUMIN 3.7 12/19/2019   ALBUMIN 4.5 05/18/2018   LABURIC 9.0 (H) 12/08/2010   LABURIC 10.2 (H) 12/02/2010   LABURIC 10.7 (H) 12/01/2010     Lab Results  Component Value Date   MG 1.8 12/21/2019   MG 1.8 07/22/2014   No results found for: VD25OH  No results found for: PREALBUMIN CBC EXTENDED Latest Ref Rng & Units 12/21/2019 12/20/2019 12/19/2019  WBC 4.0 - 10.5 K/uL 11.3(H) 5.0 8.4  RBC 4.22 - 5.81 MIL/uL 4.37 4.45 4.41  HGB 13.0 - 17.0 g/dL 14.3 14.6 14.5  HCT 39.0 - 52.0 % 42.4 43.2 43.2  PLT 150 - 400 K/uL 308 282 262  NEUTROABS 1.7 - 7.7 K/uL - - 5.3  LYMPHSABS 0.7 - 4.0 K/uL - - 2.3     There is no height or weight on file to calculate BMI.  Orders:  No orders of the defined types were placed in this encounter.  No orders of the defined types were placed in this encounter.    Procedures: No procedures performed  Clinical Data: No additional findings.  ROS:  All other systems negative, except as noted in the HPI. Review of Systems  Objective: Vital Signs: There were no vitals taken for this visit.  Specialty Comments:  No specialty comments available.  PMFS History: Patient Active Problem List   Diagnosis Date Noted  . Cutaneous abscess of right foot   .  S/P reverse total shoulder arthroplasty, right 01/31/2019  . Spinal stenosis of lumbar region 10/10/2018  . Oropharyngeal dysphagia 07/11/2018  . Pneumonia in aspergillosis ([place]) 07/11/2018  . Degeneration of lumbar intervertebral disc 05/01/2018  . History of total knee replacement, left 03/21/2018  . Age-related nuclear cataract of right eye 01/01/2018  . Aphakia of eye, left 01/01/2018  . Ocular hypertension of left eye 01/01/2018  . Retained lens material following cataract surgery of left eye 09/22/2017  . OA (osteoarthritis) of knee 01/30/2017  . Right foot pain 11/04/2016  . Amputation, toe, traumatic, right, sequela ([place]) 11/04/2016  . Neuropathy 11/04/2016  . Typical atrial flutter ([place]) 09/12/2016  . Encounter for therapeutic drug monitoring 04/18/2016  . H/O asplenia 06/15/2015  . Chronic systolic congestive heart failure  ([place]) 10/20/2014  . Osteomyelitis ([place]) 10/19/2014  . Osteomyelitis of fifth toe of right foot ([place]) 10/17/2014  . Septic arthritis of interphalangeal joint of toe of right foot ([place]) 10/17/2014  . Atrial flutter ([place]) 10/17/2014  . History of lymphoma 05/30/2014  . CKD (chronic kidney disease) stage 3, GFR 30-59 ml/min 02/27/2014  . Hyperlipidemia 01/23/2014  . Foot ulcer, right ([place]) 01/23/2014  . Right foot drop 01/23/2014  . CAD in native artery 01/23/2014  . Metabolic syndrome 99991111  . Encounter for screening colonoscopy 12/26/2013  . History of bradycardia 12/26/2013  . HTN (hypertension) 09/12/2012  . Lymphoma ([place]) 09/12/2012  . Diabetic peripheral neuropathy ([place]) 12/05/2011  . Erectile dysfunction 12/05/2011  . H/O autologous stem cell transplant ([place]) 05/30/2011  . Mantle cell lymphoma ([place]) 05/05/2011   Past Medical History:  Diagnosis Date  . Arthritis   . Bell's palsy   . CAD (coronary artery disease)   . Cancer [facility]) 2012   Mantle Cell Cancer- S/P stem cell transplant  . CKD (chronic kidney disease) stage 3, GFR 30-59 ml/min    per lov with pcp,  last seen at [facility] january 2020 per patient, stable per patient   . Diabetes mellitus without complication (HCC)    hx of lost weight due to cancer not on medications since 2014   . Dysrhythmia    atrial fib and atrial flutter, no repeat of dysrhymia since ablation in 2018 ,   . Eye injury    left eye hit by a wood chip early june 2020, lov for f/u with opthamologist 6-17 , drained a pustule,  progress notes mention eye gradually improving with application of antibiotic  eye drops   . History of chemotherapy last tx 2014  . History of diabetes mellitus NONE SINCE 2012 CANCER  . History of splenectomy   . Hyperlipemia   . Hypertension   . Neuromuscular disorder ([place])    peripheral neuropathy  . Neuropathy    right leg due to football injury  . Polysubstance abuse ([place])   . Sinus bradycardia       Family History  Problem Relation Age of Onset  . Hypertension Mother   . Diabetes Mother   . Cancer Father        pancreatic cancer  . Diabetes Father   . Heart disease Father   . Diabetes Sister     Past Surgical History:  Procedure Laterality Date  . A-FLUTTER ABLATION N/A 09/12/2016   Procedure: A-Flutter Ablation;  Surgeon: Will Meredith Leeds, MD;  Location: [place] [facility];  Service: Cardiovascular;  Laterality: N/A;  . AMPUTATION Right 10/20/2014   Procedure: AMPUTATION RIGHT GREAT TOE;  Surgeon: Gaynelle Arabian, MD;  Location: [facility];  Service: Orthopedics;  Laterality: Right;  . AMPUTATION Right 12/25/2014   Procedure: RIGHT 2ND TOE AMPUTATION ;  Surgeon: Wylene Simmer, MD;  Location: [place];  Service: Orthopedics;  Laterality: Right;  . AMPUTATION Right 12/20/2019   Procedure: RIGHT FOOT AMPUTATION TOES;  Surgeon: Newt Minion, MD;  Location: [place];  Service: Orthopedics;  Laterality: Right;  . edentulation N/A y-2   prior to transplant  . EYE SURGERY    . REVERSE SHOULDER ARTHROPLASTY Right 01/31/2019   Procedure: REVERSE SHOULDER ARTHROPLASTY;  Surgeon: Justice Britain, MD;  Location: [facility];  Service: Orthopedics;  Laterality: Right;  . right knee surgery Right    nerve graft back of leg  . SPLENECTOMY, TOTAL     mantle cell lymphoma  . TENOLYSIS Right 12/25/2014   Procedure: RIGHT 3,4,5 FLEXOR TENDON RELEASE ;  Surgeon: Wylene Simmer, MD;  Location: [facility];  Service: Orthopedics;  Laterality: Right;  . TOTAL KNEE ARTHROPLASTY Left 01/30/2017   Procedure: LEFT TOTAL KNEE ARTHROPLASTY;  Surgeon: Gaynelle Arabian, MD;  Location: [facility];  Service: Orthopedics;  Laterality: Left;   Social History   Occupational History  . Not on file  Tobacco Use  . Smoking status: Never Smoker  . Smokeless tobacco: Never Used  Substance and Sexual Activity  . Alcohol use: Yes    Alcohol/week: 1.0 standard drinks    Types: 1 Cans of beer per week     Comment: occasional   . Drug use: No    Comment: last use few years ago per patient - used marijuana ; denies use today 01-28-2019  . Sexual activity: Yes    Partners: Female

## 2019-12-26 NOTE — Telephone Encounter (Signed)
Pt was in the office today and he states that he does not have the strength to use the walker as he originally thought and that he would like the light weight wheelchair. This was ordered in parachute  today and will hold this message to monitor approval.

## 2019-12-26 NOTE — Telephone Encounter (Signed)
I talked to pt while in office he wants a light weight wheelchair instead of the walker this has been ordered.

## 2019-12-26 NOTE — Telephone Encounter (Signed)
Wheelchair has been ordered. HH called and wanted to know if the pt is in need for [facility] or any dressing changes/ wound care. Please advise if it is just a dry dressing they will not cover this.

## 2019-12-27 ENCOUNTER — Telehealth: Payer: Self-pay | Admitting: Orthopedic Surgery

## 2019-12-27 ENCOUNTER — Other Ambulatory Visit: Payer: Self-pay

## 2019-12-27 ENCOUNTER — Telehealth: Payer: Self-pay

## 2019-12-27 NOTE — Telephone Encounter (Signed)
Message to zach with [employer] to check the status of the referral for wheelchair and what I need to do.

## 2019-12-27 NOTE — Telephone Encounter (Signed)
This was done yesterday.  

## 2019-12-27 NOTE — Telephone Encounter (Signed)
I called and sw pt. He states that he went to [employer] and bought a wheelchair and is wanting a rx so that he can get reimbursement with his insurance company. I advised the pt that we had just spoken yesterday and I told him I had submit the order for the wheelchair with [employer] I have spoken with his HHPT to advise that this had been done pt states hat he could not wait and that he paid out of pocket for the chair and will file the rx with his insurance company. Rx available at front desk on Monday to call with any questions.

## 2019-12-27 NOTE — Telephone Encounter (Signed)
No need dry dressing only . Parachute pending pt contact for light weight wheelchair.

## 2019-12-27 NOTE — Telephone Encounter (Signed)
See other message this has been done.

## 2019-12-27 NOTE — Telephone Encounter (Signed)
Dionka with [facility] home health called requesting an order for a wheelchair to be sent to [place].  CB#[phone].  Thank you.

## 2019-12-27 NOTE — Telephone Encounter (Signed)
Called and lm on vm to advise that the pt is to wash the area with dial soap and water daily and apply a dry dressing and so there is no skilled nursing need at this time. Also advised about the wheelchair and [employer] reaching out to the pt for any potential out of pocket expense and delivery. To call with any questions.

## 2019-12-27 NOTE — Telephone Encounter (Signed)
Patient called.   He is still waiting on a fax with his prescription for a wheelchair.   Call back: [phone]

## 2019-12-27 NOTE — Telephone Encounter (Signed)
Patient called wanting to get a RX faxed to him or someone from our office call his insurance to authorize a wheelchair.  His fax # [phone].  Thank you.

## 2019-12-27 NOTE — Telephone Encounter (Signed)
[name] with [facility] would like clarification on [facility] for wound care for patient and orders?  Cb# [phone].  Please advise.  Thank you.

## 2019-12-30 ENCOUNTER — Telehealth: Payer: Self-pay | Admitting: Podiatry

## 2019-12-30 NOTE — Telephone Encounter (Signed)
There was a sticky note on Cory Marks's desk to call pt asap.  [name] is out of the office this week so I called pt to see if there was something I could do but had to leave a message for pt to call me back.

## 2020-01-02 ENCOUNTER — Encounter: Payer: Self-pay | Admitting: Physician Assistant

## 2020-01-02 ENCOUNTER — Other Ambulatory Visit: Payer: Self-pay

## 2020-01-02 ENCOUNTER — Ambulatory Visit (INDEPENDENT_AMBULATORY_CARE_PROVIDER_SITE_OTHER): Payer: Medicare HMO | Admitting: Physician Assistant

## 2020-01-02 VITALS — Ht 72.0 in | Wt 220.0 lb

## 2020-01-02 DIAGNOSIS — M869 Osteomyelitis, unspecified: Secondary | ICD-10-CM

## 2020-01-02 NOTE — Progress Notes (Signed)
Office Visit Note   Patient: Cory Marks           Date of Birth: [birth year]           MRN: [id number] Visit Date: 01/02/2020              Requested by: [name], MD [place],  [place] [address] PCP: [name], MD  Chief Complaint  Patient presents with  . Right Foot - Routine Post Op    12/20/19 right foot transmet amputation       HPI: The patient is 2 weeks status post right transmetatarsal amputation.  He has been doing daily dressing changes with Neosporin.  He has been compliant with his nonweightbearing status.  Assessment & Plan: Visit Diagnoses: No diagnosis found.  Plan: Given the swelling from his previous injury to his right lower leg and his amputation stump I think he would do very well with a VIVE compression sock.  He has tried compression socks in the past and had difficulty with them cutting into the top of his knee.  I have given him a prescription to try and extra-large compression stocking.  Continue to do dry dressing changes follow-up in 1 week  Follow-Up Instructions: No follow-ups on file.   Ortho Exam  Patient is alert, oriented, no adenopathy, well-dressed, normal affect, normal respiratory effort. Focused examination about 50% of the wound is well-healed.  He does have 1 area of superficial dehiscence.  There is no foul odor no purulent drainage or cellulitis.  He does have a moderate amount of soft tissue swelling coming down from his leg into his foot.  He does have a history of previous injury to his leg and has always had chronic swelling compartments are soft and nontender  Imaging: No results found. No images are attached to the encounter.  Labs: Lab Results  Component Value Date   HGBA1C 5.9 (H) 01/28/2019   HGBA1C 5.8 11/16/2017   HGBA1C 5.7 11/03/2016   ESRSEDRATE 8 12/19/2019   CRP 1.2 (H) 12/21/2019   CRP 1.5 (H) 12/20/2019   LABURIC 9.0 (H) 12/08/2010   LABURIC 10.2 (H) 12/02/2010   LABURIC 10.7 (H)  12/01/2010   REPTSTATUS 12/24/2019 FINAL 12/19/2019   CULT  12/19/2019    NO GROWTH 5 DAYS Performed at [facility], [place] [address], [place], [place] [address]      Lab Results  Component Value Date   ALBUMIN 3.1 (L) 12/20/2019   ALBUMIN 3.7 12/19/2019   ALBUMIN 4.5 05/18/2018   LABURIC 9.0 (H) 12/08/2010   LABURIC 10.2 (H) 12/02/2010   LABURIC 10.7 (H) 12/01/2010    Lab Results  Component Value Date   MG 1.8 12/21/2019   MG 1.8 07/22/2014   No results found for: VD25OH  No results found for: PREALBUMIN CBC EXTENDED Latest Ref Rng & Units 12/21/2019 12/20/2019 12/19/2019  WBC 4.0 - 10.5 K/uL 11.3(H) 5.0 8.4  RBC 4.22 - 5.81 MIL/uL 4.37 4.45 4.41  HGB 13.0 - 17.0 g/dL 14.3 14.6 14.5  HCT 39.0 - 52.0 % 42.4 43.2 43.2  PLT 150 - 400 K/uL 308 282 262  NEUTROABS 1.7 - 7.7 K/uL - - 5.3  LYMPHSABS 0.7 - 4.0 K/uL - - 2.3     Body mass index is 29.84 kg/m.  Orders:  No orders of the defined types were placed in this encounter.  No orders of the defined types were placed in this encounter.  Procedures: No procedures performed  Clinical Data: No additional findings.  ROS:  All other systems negative, except as noted in the HPI. Review of Systems  Objective: Vital Signs: Ht 6' (1.829 m)   Wt 220 lb (99.8 kg)   BMI 29.84 kg/m   Specialty Comments:  No specialty comments available.  PMFS History: Patient Active Problem List   Diagnosis Date Noted  . Cutaneous abscess of right foot   . S/P reverse total shoulder arthroplasty, right 01/31/2019  . Spinal stenosis of lumbar region 10/10/2018  . Oropharyngeal dysphagia 07/11/2018  . Pneumonia in aspergillosis ([place]) 07/11/2018  . Degeneration of lumbar intervertebral disc 05/01/2018  . History of total knee replacement, left 03/21/2018  . Age-related nuclear cataract of right eye 01/01/2018  . Aphakia of eye, left 01/01/2018  . Ocular hypertension of left eye 01/01/2018  . Retained lens material  following cataract surgery of left eye 09/22/2017  . OA (osteoarthritis) of knee 01/30/2017  . Right foot pain 11/04/2016  . Amputation, toe, traumatic, right, sequela ([place]) 11/04/2016  . Neuropathy 11/04/2016  . Typical atrial flutter ([place]) 09/12/2016  . Encounter for therapeutic drug monitoring 04/18/2016  . H/O asplenia 06/15/2015  . Chronic systolic congestive heart failure ([place]) 10/20/2014  . Osteomyelitis ([place]) 10/19/2014  . Osteomyelitis of fifth toe of right foot ([place]) 10/17/2014  . Septic arthritis of interphalangeal joint of toe of right foot ([place]) 10/17/2014  . Atrial flutter ([place]) 10/17/2014  . History of lymphoma 05/30/2014  . CKD (chronic kidney disease) stage 3, GFR 30-59 ml/min 02/27/2014  . Hyperlipidemia 01/23/2014  . Foot ulcer, right ([place]) 01/23/2014  . Right foot drop 01/23/2014  . CAD in native artery 01/23/2014  . Metabolic syndrome 99991111  . Encounter for screening colonoscopy 12/26/2013  . History of bradycardia 12/26/2013  . HTN (hypertension) 09/12/2012  . Lymphoma ([place]) 09/12/2012  . Diabetic peripheral neuropathy ([place]) 12/05/2011  . Erectile dysfunction 12/05/2011  . H/O autologous stem cell transplant ([place]) 05/30/2011  . Mantle cell lymphoma ([place]) 05/05/2011   Past Medical History:  Diagnosis Date  . Arthritis   . Bell's palsy   . CAD (coronary artery disease)   . Cancer [facility]) 2012   Mantle Cell Cancer- S/P stem cell transplant  . CKD (chronic kidney disease) stage 3, GFR 30-59 ml/min    per lov with pcp,  last seen at [facility] january 2020 per patient, stable per patient   . Diabetes mellitus without complication (HCC)    hx of lost weight due to cancer not on medications since 2014   . Dysrhythmia    atrial fib and atrial flutter, no repeat of dysrhymia since ablation in 2018 ,   . Eye injury    left eye hit by a wood chip early june 2020, lov for f/u with opthamologist 6-17 , drained a pustule,  progress notes mention eye  gradually improving with application of antibiotic  eye drops   . History of chemotherapy last tx 2014  . History of diabetes mellitus NONE SINCE 2012 CANCER  . History of splenectomy   . Hyperlipemia   . Hypertension   . Neuromuscular disorder ([place])    peripheral neuropathy  . Neuropathy    right leg due to football injury  . Polysubstance abuse ([place])   . Sinus bradycardia     Family History  Problem Relation Age of Onset  . Hypertension Mother   . Diabetes Mother   . Cancer Father  pancreatic cancer  . Diabetes Father   . Heart disease Father   . Diabetes Sister     Past Surgical History:  Procedure Laterality Date  . A-FLUTTER ABLATION N/A 09/12/2016   Procedure: A-Flutter Ablation;  Surgeon: Will Meredith Leeds, MD;  Location: [place];  Service: Cardiovascular;  Laterality: N/A;  . AMPUTATION Right 10/20/2014   Procedure: AMPUTATION RIGHT GREAT TOE;  Surgeon: Gaynelle Arabian, MD;  [facility];  Service: Orthopedics;  Laterality: Right;  . AMPUTATION Right 12/25/2014   Procedure: RIGHT 2ND TOE AMPUTATION ;  Surgeon: Wylene Simmer, MD;  Location: [place];  Service: Orthopedics;  Laterality: Right;  . AMPUTATION Right 12/20/2019   Procedure: RIGHT FOOT AMPUTATION TOES;  Surgeon: Newt Minion, MD;  Location: [place];  Service: Orthopedics;  Laterality: Right;  . edentulation N/A y-2   prior to transplant  . EYE SURGERY    . REVERSE SHOULDER ARTHROPLASTY Right 01/31/2019   Procedure: REVERSE SHOULDER ARTHROPLASTY;  Surgeon: Justice Britain, MD;  [facility];  Service: Orthopedics;  Laterality: Right;  . right knee surgery Right    nerve graft back of leg  . SPLENECTOMY, TOTAL     mantle cell lymphoma  . TENOLYSIS Right 12/25/2014   Procedure: RIGHT 3,4,5 FLEXOR TENDON RELEASE ;  Surgeon: Wylene Simmer, MD;  Location: [facility];  Service: Orthopedics;  Laterality: Right;  . TOTAL KNEE ARTHROPLASTY Left 01/30/2017   Procedure:  LEFT TOTAL KNEE ARTHROPLASTY;  Surgeon: Gaynelle Arabian, MD;  [facility];  Service: Orthopedics;  Laterality: Left;   Social History   Occupational History  . Not on file  Tobacco Use  . Smoking status: Never Smoker  . Smokeless tobacco: Never Used  Substance and Sexual Activity  . Alcohol use: Yes    Alcohol/week: 1.0 standard drinks    Types: 1 Cans of beer per week    Comment: occasional   . Drug use: No    Comment: last use few years ago per patient - used marijuana ; denies use today 01-28-2019  . Sexual activity: Yes    Partners: Female

## 2020-01-08 ENCOUNTER — Other Ambulatory Visit: Payer: Self-pay

## 2020-01-08 ENCOUNTER — Ambulatory Visit (INDEPENDENT_AMBULATORY_CARE_PROVIDER_SITE_OTHER): Payer: Medicare HMO | Admitting: Family

## 2020-01-08 ENCOUNTER — Telehealth: Payer: Self-pay | Admitting: Orthopedic Surgery

## 2020-01-08 ENCOUNTER — Telehealth: Payer: Self-pay | Admitting: Family

## 2020-01-08 ENCOUNTER — Encounter: Payer: Self-pay | Admitting: Family

## 2020-01-08 VITALS — Ht 72.0 in | Wt 220.0 lb

## 2020-01-08 DIAGNOSIS — M869 Osteomyelitis, unspecified: Secondary | ICD-10-CM

## 2020-01-08 NOTE — Progress Notes (Signed)
Post-Op Visit Note   Patient: Cory Marks           Date of Birth: [birth year]           MRN: [id number] Visit Date: 01/08/2020 PCP: [name], MD  Chief Complaint:  Chief Complaint  Patient presents with  . Right Foot - Routine Post Op    12/20/19 right foot transmet amputation     HPI:  HPI  patient is a 59 year old gentleman seen today 3 weeks status post right foot transmetatarsal amputation.  He has been cleansing daily with peroxide also using a spray antiseptic.  There is peeling skin some surrounding maceration.  The patient states that he is doing quite a bit of weightbearing in his postop shoe  Ortho Exam Incision well approximated sutures there is surrounding maceration and peeling skin there is no gaping no active drainage no erythema there is some tenderness beneath the fifth metatarsal distally.  There is no palpable fluctuance no sign of abscess or infection  Visit Diagnoses: No diagnosis found.  Plan: He would like to hold on suture removal as he is quite active with weightbearing.  Discussed the importance of dry dressing changes and holding with the peroxide.  Follow-Up Instructions: No follow-ups on file.   Imaging: No results found.  Orders:  No orders of the defined types were placed in this encounter.  No orders of the defined types were placed in this encounter.    PMFS History: Patient Active Problem List   Diagnosis Date Noted  . Cutaneous abscess of right foot   . S/P reverse total shoulder arthroplasty, right 01/31/2019  . Spinal stenosis of lumbar region 10/10/2018  . Oropharyngeal dysphagia 07/11/2018  . Pneumonia in aspergillosis ([place]) 07/11/2018  . Degeneration of lumbar intervertebral disc 05/01/2018  . History of total knee replacement, left 03/21/2018  . Age-related nuclear cataract of right eye 01/01/2018  . Aphakia of eye, left 01/01/2018  . Ocular hypertension of left eye 01/01/2018  . Retained lens material following  cataract surgery of left eye 09/22/2017  . OA (osteoarthritis) of knee 01/30/2017  . Right foot pain 11/04/2016  . Amputation, toe, traumatic, right, sequela ([place]) 11/04/2016  . Neuropathy 11/04/2016  . Typical atrial flutter ([place]) 09/12/2016  . Encounter for therapeutic drug monitoring 04/18/2016  . H/O asplenia 06/15/2015  . Chronic systolic congestive heart failure ([place]) 10/20/2014  . Osteomyelitis ([place]) 10/19/2014  . Osteomyelitis of fifth toe of right foot ([place]) 10/17/2014  . Septic arthritis of interphalangeal joint of toe of right foot ([place]) 10/17/2014  . Atrial flutter ([place]) 10/17/2014  . History of lymphoma 05/30/2014  . CKD (chronic kidney disease) stage 3, GFR 30-59 ml/min 02/27/2014  . Hyperlipidemia 01/23/2014  . Foot ulcer, right ([place]) 01/23/2014  . Right foot drop 01/23/2014  . CAD in native artery 01/23/2014  . Metabolic syndrome 99991111  . Encounter for screening colonoscopy 12/26/2013  . History of bradycardia 12/26/2013  . HTN (hypertension) 09/12/2012  . Lymphoma ([place]) 09/12/2012  . Diabetic peripheral neuropathy ([place]) 12/05/2011  . Erectile dysfunction 12/05/2011  . H/O autologous stem cell transplant ([place]) 05/30/2011  . Mantle cell lymphoma ([place]) 05/05/2011   Past Medical History:  Diagnosis Date  . Arthritis   . Bell's palsy   . CAD (coronary artery disease)   . Cancer [facility]) 2012   Mantle Cell Cancer- S/P stem cell transplant  . CKD (chronic kidney disease) stage 3, GFR 30-59 ml/min    per lov with pcp,  last seen at [facility] january 2020 per patient, stable per patient   . Diabetes mellitus without complication (HCC)    hx of lost weight due to cancer not on medications since 2014   . Dysrhythmia    atrial fib and atrial flutter, no repeat of dysrhymia since ablation in 2018 ,   . Eye injury    left eye hit by a wood chip early june 2020, lov for f/u with opthamologist 6-17 , drained a pustule,  progress notes mention eye gradually  improving with application of antibiotic  eye drops   . History of chemotherapy last tx 2014  . History of diabetes mellitus NONE SINCE 2012 CANCER  . History of splenectomy   . Hyperlipemia   . Hypertension   . Neuromuscular disorder ([place])    peripheral neuropathy  . Neuropathy    right leg due to football injury  . Polysubstance abuse ([place])   . Sinus bradycardia     Family History  Problem Relation Age of Onset  . Hypertension Mother   . Diabetes Mother   . Cancer Father        pancreatic cancer  . Diabetes Father   . Heart disease Father   . Diabetes Sister     Past Surgical History:  Procedure Laterality Date  . A-FLUTTER ABLATION N/A 09/12/2016   Procedure: A-Flutter Ablation;  Surgeon: Will Meredith Leeds, MD;  Location: [facility];  Service: Cardiovascular;  Laterality: N/A;  . AMPUTATION Right 10/20/2014   Procedure: AMPUTATION RIGHT GREAT TOE;  Surgeon: Gaynelle Arabian, MD;  [facility];  Service: Orthopedics;  Laterality: Right;  . AMPUTATION Right 12/25/2014   Procedure: RIGHT 2ND TOE AMPUTATION ;  Surgeon: Wylene Simmer, MD;  Location: [facility];  Service: Orthopedics;  Laterality: Right;  . AMPUTATION Right 12/20/2019   Procedure: RIGHT FOOT AMPUTATION TOES;  Surgeon: Newt Minion, MD;  Location: [facility];  Service: Orthopedics;  Laterality: Right;  . edentulation N/A y-2   prior to transplant  . EYE SURGERY    . REVERSE SHOULDER ARTHROPLASTY Right 01/31/2019   Procedure: REVERSE SHOULDER ARTHROPLASTY;  Surgeon: Justice Britain, MD;  [facility];  Service: Orthopedics;  Laterality: Right;  . right knee surgery Right    nerve graft back of leg  . SPLENECTOMY, TOTAL     mantle cell lymphoma  . TENOLYSIS Right 12/25/2014   Procedure: RIGHT 3,4,5 FLEXOR TENDON RELEASE ;  Surgeon: Wylene Simmer, MD;  Location: [place];  Service: Orthopedics;  Laterality: Right;  . TOTAL KNEE ARTHROPLASTY Left 01/30/2017   Procedure: LEFT TOTAL  KNEE ARTHROPLASTY;  Surgeon: Gaynelle Arabian, MD;  [facility];  Service: Orthopedics;  Laterality: Left;   Social History   Occupational History  . Not on file  Tobacco Use  . Smoking status: Never Smoker  . Smokeless tobacco: Never Used  Substance and Sexual Activity  . Alcohol use: Yes    Alcohol/week: 1.0 standard drinks    Types: 1 Cans of beer per week    Comment: occasional   . Drug use: No    Comment: last use few years ago per patient - used marijuana ; denies use today 01-28-2019  . Sexual activity: Yes    Partners: Female

## 2020-01-08 NOTE — Telephone Encounter (Signed)
Cory Marks with [facility] called wanting to make [place] aware the pt is disregarding the weight restrictions and walking without any type of assistance.      Cory Marks CB# [phone]

## 2020-01-08 NOTE — Telephone Encounter (Signed)
I called pt and advised that Dr. Sharol Given has not ever given rx for this pain medication for the pt and he will have to call the doctor that normally writes this rx for him. Voiced understanding and will call with questions.

## 2020-01-08 NOTE — Telephone Encounter (Signed)
Pt was evaluated in the office today and was using a post op shoe and rolling walker.

## 2020-01-08 NOTE — Telephone Encounter (Signed)
Patient called needing Rx refilled Hydromorphone. The number to contact patient is [phone]

## 2020-01-14 ENCOUNTER — Telehealth: Payer: Self-pay

## 2020-01-14 NOTE — Telephone Encounter (Addendum)
Cory Marks with [facility] wanted to let Dr. Sharol Given know that patient has a temperature of 105 and his BP is 150/100 and it usually runs high, but patient stated that he feels fine. [name] stated that PCP is aware.  Patient is unable to maintain WB status, patient continues to put weight on his right foot.  His pain is 7 out of 10 and patient is out of Dilaudid.  Cb# [phone].  Please advise.  Thank you.

## 2020-01-14 NOTE — Telephone Encounter (Signed)
I called and sw pt and offered an appt for eval today and he declined. I advised that given his recent surgery and temp as reported by physical therapy that he should come in and have some one take a look and make sure no signs of infection. He will come in tomorrow at 10:30

## 2020-01-15 ENCOUNTER — Ambulatory Visit (INDEPENDENT_AMBULATORY_CARE_PROVIDER_SITE_OTHER): Payer: Medicare HMO | Admitting: Physician Assistant

## 2020-01-15 ENCOUNTER — Other Ambulatory Visit: Payer: Self-pay

## 2020-01-15 ENCOUNTER — Encounter: Payer: Self-pay | Admitting: Physician Assistant

## 2020-01-15 VITALS — Ht 72.0 in | Wt 220.0 lb

## 2020-01-15 DIAGNOSIS — M869 Osteomyelitis, unspecified: Secondary | ICD-10-CM

## 2020-01-15 NOTE — Progress Notes (Signed)
Office Visit Note   Patient: Cory Marks           Date of Birth: 01/06/1961           MRN: [id number] Visit Date: 01/15/2020              Requested by: [name], MD [place],  [place] [address] PCP: [name], MD  Chief Complaint  Patient presents with  . Right Foot - Routine Post Op    12/20/19 Right foot TMA      HPI: This is a pleasant gentleman who is now 4 weeks status post right foot transmetatarsal amputation.  He has been doing daily dressing changes.  He is no longer using hydrogen peroxide on the wound.  He is also status post injury to his right lower extremity.  He has chronic swelling in this leg that is been going on for 30 to 40 years.  He notices the veins are more prominent in the back of his legs but they are not painful  Assessment & Plan: Visit Diagnoses: No diagnosis found.  Plan: I did gVIVE compression stockings.  I think these would be very helpful with some of his swelling in his leg which has been chronic and unchanged.  I think it would also help with healing of his amputation site.  He was measured for extra-large  Follow-Up Instructions: No follow-ups on file.   Ortho Exam  Patient is alert, oriented, no adenopathy, well-dressed, normal affect, normal respiratory effort. Right lower extremity well-healed surgical incisions.  He does have some fullness in the distal right lower extremity.  Per patient's report this has been chronic and is unchanged.  He does have some varicosities in the back of his leg but no tenderness to palpation compartments are soft he does also have findings consistent with a with some venous stasis disease in his right lower extremity focused examination of his amputation stump demonstrates overall healing incision.  He does have some peeling skin which was debrided to reveal healthy soft tissue.  No cellulitis no foul odor mild wound dehiscence and maceration.  Imaging: No results found. No images are  attached to the encounter.  Labs: Lab Results  Component Value Date   HGBA1C 5.9 (H) 01/28/2019   HGBA1C 5.8 11/16/2017   HGBA1C 5.7 11/03/2016   ESRSEDRATE 8 12/19/2019   CRP 1.2 (H) 12/21/2019   CRP 1.5 (H) 12/20/2019   LABURIC 9.0 (H) 12/08/2010   LABURIC 10.2 (H) 12/02/2010   LABURIC 10.7 (H) 12/01/2010   REPTSTATUS 12/24/2019 FINAL 12/19/2019   CULT  12/19/2019    NO GROWTH 5 DAYS Performed at [facility], [place] [address] [place], [place] [address]      Lab Results  Component Value Date   ALBUMIN 3.1 (L) 12/20/2019   ALBUMIN 3.7 12/19/2019   ALBUMIN 4.5 05/18/2018   LABURIC 9.0 (H) 12/08/2010   LABURIC 10.2 (H) 12/02/2010   LABURIC 10.7 (H) 12/01/2010    Lab Results  Component Value Date   MG 1.8 12/21/2019   MG 1.8 07/22/2014   No results found for: VD25OH  No results found for: PREALBUMIN CBC EXTENDED Latest Ref Rng & Units 12/21/2019 12/20/2019 12/19/2019  WBC 4.0 - 10.5 K/uL 11.3(H) 5.0 8.4  RBC 4.22 - 5.81 MIL/uL 4.37 4.45 4.41  HGB 13.0 - 17.0 g/dL 14.3 14.6 14.5  HCT 39.0 - 52.0 % 42.4 43.2 43.2  PLT 150 - 400 K/uL 308 282  262  NEUTROABS 1.7 - 7.7 K/uL - - 5.3  LYMPHSABS 0.7 - 4.0 K/uL - - 2.3     Body mass index is 29.84 kg/m.  Orders:  No orders of the defined types were placed in this encounter.  No orders of the defined types were placed in this encounter.    Procedures: No procedures performed  Clinical Data: No additional findings.  ROS:  All other systems negative, except as noted in the HPI. Review of Systems  Objective: Vital Signs: Ht 6' (1.829 m)   Wt 220 lb (99.8 kg)   BMI 29.84 kg/m   Specialty Comments:  No specialty comments available.  PMFS History: Patient Active Problem List   Diagnosis Date Noted  . Cutaneous abscess of right foot   . S/P reverse total shoulder arthroplasty, right 01/31/2019  . Spinal stenosis of lumbar region 10/10/2018  . Oropharyngeal dysphagia 07/11/2018  . Pneumonia in  aspergillosis ([place]) 07/11/2018  . Degeneration of lumbar intervertebral disc 05/01/2018  . History of total knee replacement, left 03/21/2018  . Age-related nuclear cataract of right eye 01/01/2018  . Aphakia of eye, left 01/01/2018  . Ocular hypertension of left eye 01/01/2018  . Retained lens material following cataract surgery of left eye 09/22/2017  . OA (osteoarthritis) of knee 01/30/2017  . Right foot pain 11/04/2016  . Amputation, toe, traumatic, right, sequela ([place]) 11/04/2016  . Neuropathy 11/04/2016  . Typical atrial flutter ([place]) 09/12/2016  . Encounter for therapeutic drug monitoring 04/18/2016  . H/O asplenia 06/15/2015  . Chronic systolic congestive heart failure ([place]) 10/20/2014  . Osteomyelitis ([place]) 10/19/2014  . Osteomyelitis of fifth toe of right foot ([place]) 10/17/2014  . Septic arthritis of interphalangeal joint of toe of right foot ([place]) 10/17/2014  . Atrial flutter ([place]) 10/17/2014  . History of lymphoma 05/30/2014  . CKD (chronic kidney disease) stage 3, GFR 30-59 ml/min 02/27/2014  . Hyperlipidemia 01/23/2014  . Foot ulcer, right ([place]) 01/23/2014  . Right foot drop 01/23/2014  . CAD in native artery 01/23/2014  . Metabolic syndrome 78/29/5621  . Encounter for screening colonoscopy 12/26/2013  . History of bradycardia 12/26/2013  . HTN (hypertension) 09/12/2012  . Lymphoma ([place]) 09/12/2012  . Diabetic peripheral neuropathy ([place]) 12/05/2011  . Erectile dysfunction 12/05/2011  . H/O autologous stem cell transplant ([place]) 05/30/2011  . Mantle cell lymphoma ([place]) 05/05/2011   Past Medical History:  Diagnosis Date  . Arthritis   . Bell's palsy   . CAD (coronary artery disease)   . Cancer [facility]) 2012   Mantle Cell Cancer- S/P stem cell transplant  . CKD (chronic kidney disease) stage 3, GFR 30-59 ml/min    per lov with pcp,  last seen at [facility] january 2020 per patient, stable per patient   . Diabetes mellitus without complication (HCC)    hx of  lost weight due to cancer not on medications since 2014   . Dysrhythmia    atrial fib and atrial flutter, no repeat of dysrhymia since ablation in 2018 ,   . Eye injury    left eye hit by a wood chip early june 2020, lov for f/u with opthamologist 6-17 , drained a pustule,  progress notes mention eye gradually improving with application of antibiotic  eye drops   . History of chemotherapy last tx 2014  . History of diabetes mellitus NONE SINCE 2012 CANCER  . History of splenectomy   . Hyperlipemia   . Hypertension   . Neuromuscular disorder ([place])  peripheral neuropathy  . Neuropathy    right leg due to football injury  . Polysubstance abuse ([place])   . Sinus bradycardia     Family History  Problem Relation Age of Onset  . Hypertension Mother   . Diabetes Mother   . Cancer Father        pancreatic cancer  . Diabetes Father   . Heart disease Father   . Diabetes Sister     Past Surgical History:  Procedure Laterality Date  . A-FLUTTER ABLATION N/A 09/12/2016   Procedure: A-Flutter Ablation;  Surgeon: Will Meredith Leeds, MD;  Location: [place] [facility];  Service: Cardiovascular;  Laterality: N/A;  . AMPUTATION Right 10/20/2014   Procedure: AMPUTATION RIGHT GREAT TOE;  Surgeon: Gaynelle Arabian, MD;  [facility];  Service: Orthopedics;  Laterality: Right;  . AMPUTATION Right 12/25/2014   Procedure: RIGHT 2ND TOE AMPUTATION ;  Surgeon: Wylene Simmer, MD;  Location: [place];  Service: Orthopedics;  Laterality: Right;  . AMPUTATION Right 12/20/2019   Procedure: RIGHT FOOT AMPUTATION TOES;  Surgeon: Newt Minion, MD;  Location: [place];  Service: Orthopedics;  Laterality: Right;  . edentulation N/A y-2   prior to transplant  . EYE SURGERY    . REVERSE SHOULDER ARTHROPLASTY Right 01/31/2019   Procedure: REVERSE SHOULDER ARTHROPLASTY;  Surgeon: Justice Britain, MD;  [facility];  Service: Orthopedics;  Laterality: Right;  . right knee surgery Right    nerve graft  back of leg  . SPLENECTOMY, TOTAL     mantle cell lymphoma  . TENOLYSIS Right 12/25/2014   Procedure: RIGHT 3,4,5 FLEXOR TENDON RELEASE ;  Surgeon: Wylene Simmer, MD;  Location: [place];  Service: Orthopedics;  Laterality: Right;  . TOTAL KNEE ARTHROPLASTY Left 01/30/2017   Procedure: LEFT TOTAL KNEE ARTHROPLASTY;  Surgeon: Gaynelle Arabian, MD;  [facility];  Service: Orthopedics;  Laterality: Left;   Social History   Occupational History  . Not on file  Tobacco Use  . Smoking status: Never Smoker  . Smokeless tobacco: Never Used  Substance and Sexual Activity  . Alcohol use: Yes    Alcohol/week: 1.0 standard drinks    Types: 1 Cans of beer per week    Comment: occasional   . Drug use: No    Comment: last use few years ago per patient - used marijuana ; denies use today 01-28-2019  . Sexual activity: Yes    Partners: Female

## 2020-01-20 ENCOUNTER — Encounter: Payer: Self-pay | Admitting: Physician Assistant

## 2020-01-20 ENCOUNTER — Ambulatory Visit (INDEPENDENT_AMBULATORY_CARE_PROVIDER_SITE_OTHER): Payer: Medicare HMO | Admitting: Physician Assistant

## 2020-01-20 ENCOUNTER — Ambulatory Visit: Payer: Self-pay

## 2020-01-20 ENCOUNTER — Other Ambulatory Visit: Payer: Self-pay | Admitting: Physician Assistant

## 2020-01-20 ENCOUNTER — Other Ambulatory Visit: Payer: Self-pay

## 2020-01-20 ENCOUNTER — Other Ambulatory Visit (HOSPITAL_COMMUNITY)
Admission: RE | Admit: 2020-01-20 | Discharge: 2020-01-20 | Disposition: A | Discharge status: Home / Self Care | Payer: Medicare HMO | Source: Ambulatory Visit | Attending: Orthopedic Surgery | Admitting: Orthopedic Surgery

## 2020-01-20 VITALS — Ht 72.0 in | Wt 220.0 lb

## 2020-01-20 DIAGNOSIS — Z01812 Encounter for preprocedural laboratory examination: Secondary | ICD-10-CM | POA: Diagnosis present

## 2020-01-20 DIAGNOSIS — M86071 Acute hematogenous osteomyelitis, right ankle and foot: Secondary | ICD-10-CM | POA: Diagnosis not present

## 2020-01-20 DIAGNOSIS — Z20822 Contact with and (suspected) exposure to covid-19: Secondary | ICD-10-CM | POA: Insufficient documentation

## 2020-01-20 DIAGNOSIS — M869 Osteomyelitis, unspecified: Secondary | ICD-10-CM

## 2020-01-20 LAB — SARS CORONAVIRUS 2 (TAT 6-24 HRS): SARS Coronavirus 2: NEGATIVE

## 2020-01-20 MED ORDER — DOXYCYCLINE HYCLATE 100 MG PO TABS: 100.0000 mg | ORAL_TABLET | Freq: Two times a day (BID) | ORAL | 0 refills | Status: DC

## 2020-01-20 NOTE — Progress Notes (Addendum)
Office Visit Note   Patient: Cory Marks           Date of Birth: 1960/12/28           MRN: [id number] Visit Date: 01/20/2020              Requested by: [name], MD [place],  [place] [address] PCP: [name], MD  Chief Complaint  Patient presents with  . Right Foot - Routine Post Op    12/20/19 right foot transmet amputation       HPI: The patient is a pleasant 59 year old gentleman who is 1 month status post right transmetatarsal amputation.  His surgical sutures were removed last week.  He was doing well but over the weekend developed 2 new areas of drainage on the lateral side of his foot.  He also noticed some skin maceration.  He denies any fever or chills.  He is on chronic penicillin for being status post a splenectomy  Assessment & Plan: Visit Diagnoses:  1. Acute hematogenous osteomyelitis of right foot [facility])     Plan: Patient was seen today by Dr. Sharol Given he has recommended a right fifth ray amputation.  Until then we will place him on doxycycline plan for surgery in 2 days reviewed the risks of surgery with him he wishes to go forward.  Follow-Up Instructions: No follow-ups on file.   Ortho Exam  Patient is alert, oriented, no adenopathy, well-dressed, normal affect, normal respiratory effort. Focused examination of his right foot demonstrates moderate soft tissue swelling.  Area of transmetatarsal amputation has healed.  He does have a small pinpoint area to the far lateral and near the fifth distal metatarsal that is open with purulent drainage.  Along the lateral side of the foot there are 2 additional areas of drainage that probes deeply with necrotic tissue.  He has biphasic dorsalis pedis and posterior tibial tendon pulses were appreciated.  Lungs clear heart regular rate and rhythm  Imaging: No results found. No images are attached to the encounter.  Labs: Lab Results  Component Value Date   HGBA1C 5.9 (H) 01/28/2019   HGBA1C 5.8  11/16/2017   HGBA1C 5.7 11/03/2016   ESRSEDRATE 8 12/19/2019   CRP 1.2 (H) 12/21/2019   CRP 1.5 (H) 12/20/2019   LABURIC 9.0 (H) 12/08/2010   LABURIC 10.2 (H) 12/02/2010   LABURIC 10.7 (H) 12/01/2010   REPTSTATUS 12/24/2019 FINAL 12/19/2019   CULT  12/19/2019    NO GROWTH 5 DAYS Performed at [facility], [place] [address] [place], [place] [address]      Lab Results  Component Value Date   ALBUMIN 3.1 (L) 12/20/2019   ALBUMIN 3.7 12/19/2019   ALBUMIN 4.5 05/18/2018   LABURIC 9.0 (H) 12/08/2010   LABURIC 10.2 (H) 12/02/2010   LABURIC 10.7 (H) 12/01/2010    Lab Results  Component Value Date   MG 1.8 12/21/2019   MG 1.8 07/22/2014   No results found for: VD25OH  No results found for: PREALBUMIN CBC EXTENDED Latest Ref Rng & Units 12/21/2019 12/20/2019 12/19/2019  WBC 4.0 - 10.5 K/uL 11.3(H) 5.0 8.4  RBC 4.22 - 5.81 MIL/uL 4.37 4.45 4.41  HGB 13.0 - 17.0 g/dL 14.3 14.6 14.5  HCT 39 - 52 % 42.4 43.2 43.2  PLT 150 - 400 K/uL 308 282 262  NEUTROABS 1.7 - 7.7 K/uL - - 5.3  LYMPHSABS 0.7 - 4.0 K/uL - - 2.3     Body  mass index is 29.84 kg/m.  Orders:  Orders Placed This Encounter  Procedures  . XR Foot Complete Right   Meds ordered this encounter  Medications  . doxycycline (VIBRA-TABS) 100 MG tablet    Sig: Take 1 tablet (100 mg total) by mouth 2 (two) times daily.    Dispense:  60 tablet    Refill:  0     Procedures: No procedures performed  Clinical Data: No additional findings.  ROS:  All other systems negative, except as noted in the HPI. Review of Systems  Objective: Vital Signs: Ht 6' (1.829 m)   Wt 220 lb (99.8 kg)   BMI 29.84 kg/m   Specialty Comments:  No specialty comments available.  PMFS History: Patient Active Problem List   Diagnosis Date Noted  . Cutaneous abscess of right foot   . S/P reverse total shoulder arthroplasty, right 01/31/2019  . Spinal stenosis of lumbar region 10/10/2018  . Oropharyngeal dysphagia 07/11/2018    . Pneumonia in aspergillosis ([place]) 07/11/2018  . Degeneration of lumbar intervertebral disc 05/01/2018  . History of total knee replacement, left 03/21/2018  . Age-related nuclear cataract of right eye 01/01/2018  . Aphakia of eye, left 01/01/2018  . Ocular hypertension of left eye 01/01/2018  . Retained lens material following cataract surgery of left eye 09/22/2017  . OA (osteoarthritis) of knee 01/30/2017  . Right foot pain 11/04/2016  . Amputation, toe, traumatic, right, sequela ([place]) 11/04/2016  . Neuropathy 11/04/2016  . Typical atrial flutter ([place]) 09/12/2016  . Encounter for therapeutic drug monitoring 04/18/2016  . H/O asplenia 06/15/2015  . Chronic systolic congestive heart failure ([place]) 10/20/2014  . Osteomyelitis ([place]) 10/19/2014  . Osteomyelitis of fifth toe of right foot ([place]) 10/17/2014  . Septic arthritis of interphalangeal joint of toe of right foot ([place]) 10/17/2014  . Atrial flutter ([place]) 10/17/2014  . History of lymphoma 05/30/2014  . CKD (chronic kidney disease) stage 3, GFR 30-59 ml/min 02/27/2014  . Hyperlipidemia 01/23/2014  . Foot ulcer, right ([place]) 01/23/2014  . Right foot drop 01/23/2014  . CAD in native artery 01/23/2014  . Metabolic syndrome 75/64/3329  . Encounter for screening colonoscopy 12/26/2013  . History of bradycardia 12/26/2013  . HTN (hypertension) 09/12/2012  . Lymphoma ([place]) 09/12/2012  . Diabetic peripheral neuropathy ([place]) 12/05/2011  . Erectile dysfunction 12/05/2011  . H/O autologous stem cell transplant ([place]) 05/30/2011  . Mantle cell lymphoma ([place]) 05/05/2011   Past Medical History:  Diagnosis Date  . Arthritis   . Bell's palsy   . CAD (coronary artery disease)   . Cancer [facility]) 2012   Mantle Cell Cancer- S/P stem cell transplant  . CKD (chronic kidney disease) stage 3, GFR 30-59 ml/min    per lov with pcp,  last seen at [facility] january 2020 per patient, stable per patient   . Diabetes mellitus without complication  (HCC)    hx of lost weight due to cancer not on medications since 2014   . Dysrhythmia    atrial fib and atrial flutter, no repeat of dysrhymia since ablation in 2018 ,   . Eye injury    left eye hit by a wood chip early june 2020, lov for f/u with opthamologist 6-17 , drained a pustule,  progress notes mention eye gradually improving with application of antibiotic  eye drops   . History of chemotherapy last tx 2014  . History of diabetes mellitus NONE SINCE 2012 CANCER  . History of splenectomy   . Hyperlipemia   .  Hypertension   . Neuromuscular disorder ([place])    peripheral neuropathy  . Neuropathy    right leg due to football injury  . Polysubstance abuse ([place])   . Sinus bradycardia     Family History  Problem Relation Age of Onset  . Hypertension Mother   . Diabetes Mother   . Cancer Father        pancreatic cancer  . Diabetes Father   . Heart disease Father   . Diabetes Sister     Past Surgical History:  Procedure Laterality Date  . A-FLUTTER ABLATION N/A 09/12/2016   Procedure: A-Flutter Ablation;  Surgeon: Will Meredith Leeds, MD;  Location: [place] [facility];  Service: Cardiovascular;  Laterality: N/A;  . AMPUTATION Right 10/20/2014   Procedure: AMPUTATION RIGHT GREAT TOE;  Surgeon: Gaynelle Arabian, MD;  [facility];  Service: Orthopedics;  Laterality: Right;  . AMPUTATION Right 12/25/2014   Procedure: RIGHT 2ND TOE AMPUTATION ;  Surgeon: Wylene Simmer, MD;  Location: [place];  Service: Orthopedics;  Laterality: Right;  . AMPUTATION Right 12/20/2019   Procedure: RIGHT FOOT AMPUTATION TOES;  Surgeon: Newt Minion, MD;  Location: [facility];  Service: Orthopedics;  Laterality: Right;  . edentulation N/A y-2   prior to transplant  . EYE SURGERY    . REVERSE SHOULDER ARTHROPLASTY Right 01/31/2019   Procedure: REVERSE SHOULDER ARTHROPLASTY;  Surgeon: Justice Britain, MD;  [facility];  Service: Orthopedics;  Laterality: Right;  . right knee surgery Right     nerve graft back of leg  . SPLENECTOMY, TOTAL     mantle cell lymphoma  . TENOLYSIS Right 12/25/2014   Procedure: RIGHT 3,4,5 FLEXOR TENDON RELEASE ;  Surgeon: Wylene Simmer, MD;  Location: [facility];  Service: Orthopedics;  Laterality: Right;  . TOTAL KNEE ARTHROPLASTY Left 01/30/2017   Procedure: LEFT TOTAL KNEE ARTHROPLASTY;  Surgeon: Gaynelle Arabian, MD;  [facility];  Service: Orthopedics;  Laterality: Left;   Social History   Occupational History  . Not on file  Tobacco Use  . Smoking status: Never Smoker  . Smokeless tobacco: Never Used  Vaping Use  . Vaping Use: Never used  Substance and Sexual Activity  . Alcohol use: Yes    Alcohol/week: 1.0 standard drink    Types: 1 Cans of beer per week    Comment: occasional   . Drug use: No    Comment: last use few years ago per patient - used marijuana ; denies use today 01-28-2019  . Sexual activity: Yes    Partners: Female

## 2020-01-21 ENCOUNTER — Other Ambulatory Visit: Payer: Self-pay

## 2020-01-21 ENCOUNTER — Other Ambulatory Visit (HOSPITAL_COMMUNITY): Payer: Medicare HMO

## 2020-01-21 ENCOUNTER — Encounter (HOSPITAL_COMMUNITY): Payer: Self-pay | Admitting: Orthopedic Surgery

## 2020-01-21 MED ORDER — VANCOMYCIN HCL 1500 MG/300ML IV SOLN
1500.0000 mg | INTRAVENOUS | Status: AC
  Administered 2020-01-22: 1500 mg via INTRAVENOUS
  Filled 2020-01-21: qty 300

## 2020-01-21 NOTE — Progress Notes (Signed)
[name] denies chest pain or shortness of breath. Patient tested negative for Covid_6/14/2021_ and has been in quarantine since that time.  [name] has type II diabetes,he is not on any medications, he does not routine check CBG. I instructed patient to check CBG after awaking and every 2 hours until arrival  to the hospital.  I Instructed patient if CBG is less than 70 to drink 1/2 cup of a clear juice. Recheck CBG in 15 minutes then call pre- op desk at [phone] for further instructions.

## 2020-01-22 ENCOUNTER — Encounter (HOSPITAL_COMMUNITY): Payer: Self-pay | Admitting: Orthopedic Surgery

## 2020-01-22 ENCOUNTER — Observation Stay (HOSPITAL_COMMUNITY)
Admission: RE | Admit: 2020-01-22 | Discharge: 2020-01-23 | Disposition: A | Discharge status: Home / Self Care | Payer: Medicare HMO | Attending: Orthopedic Surgery | Admitting: Orthopedic Surgery

## 2020-01-22 ENCOUNTER — Other Ambulatory Visit: Payer: Self-pay

## 2020-01-22 ENCOUNTER — Ambulatory Visit: Payer: Medicare HMO | Admitting: Family

## 2020-01-22 ENCOUNTER — Encounter (HOSPITAL_COMMUNITY)
Admission: RE | Disposition: A | Discharge status: Home / Self Care | Payer: Self-pay | Source: Home / Self Care | Attending: Orthopedic Surgery

## 2020-01-22 ENCOUNTER — Ambulatory Visit (HOSPITAL_COMMUNITY): Payer: Medicare HMO | Admitting: Physician Assistant

## 2020-01-22 DIAGNOSIS — R001 Bradycardia, unspecified: Secondary | ICD-10-CM | POA: Diagnosis not present

## 2020-01-22 DIAGNOSIS — E1169 Type 2 diabetes mellitus with other specified complication: Secondary | ICD-10-CM | POA: Diagnosis not present

## 2020-01-22 DIAGNOSIS — E1151 Type 2 diabetes mellitus with diabetic peripheral angiopathy without gangrene: Secondary | ICD-10-CM | POA: Insufficient documentation

## 2020-01-22 DIAGNOSIS — Z96611 Presence of right artificial shoulder joint: Secondary | ICD-10-CM | POA: Insufficient documentation

## 2020-01-22 DIAGNOSIS — I2510 Atherosclerotic heart disease of native coronary artery without angina pectoris: Secondary | ICD-10-CM | POA: Diagnosis not present

## 2020-01-22 DIAGNOSIS — Z885 Allergy status to narcotic agent status: Secondary | ICD-10-CM | POA: Diagnosis not present

## 2020-01-22 DIAGNOSIS — Z8249 Family history of ischemic heart disease and other diseases of the circulatory system: Secondary | ICD-10-CM | POA: Diagnosis not present

## 2020-01-22 DIAGNOSIS — M86071 Acute hematogenous osteomyelitis, right ankle and foot: Secondary | ICD-10-CM | POA: Diagnosis present

## 2020-01-22 DIAGNOSIS — G510 Bell's palsy: Secondary | ICD-10-CM | POA: Diagnosis not present

## 2020-01-22 DIAGNOSIS — L02611 Cutaneous abscess of right foot: Secondary | ICD-10-CM | POA: Diagnosis not present

## 2020-01-22 DIAGNOSIS — Z9484 Stem cells transplant status: Secondary | ICD-10-CM | POA: Diagnosis not present

## 2020-01-22 DIAGNOSIS — Z9221 Personal history of antineoplastic chemotherapy: Secondary | ICD-10-CM | POA: Insufficient documentation

## 2020-01-22 DIAGNOSIS — I129 Hypertensive chronic kidney disease with stage 1 through stage 4 chronic kidney disease, or unspecified chronic kidney disease: Secondary | ICD-10-CM | POA: Insufficient documentation

## 2020-01-22 DIAGNOSIS — E1122 Type 2 diabetes mellitus with diabetic chronic kidney disease: Secondary | ICD-10-CM | POA: Insufficient documentation

## 2020-01-22 DIAGNOSIS — Z9081 Acquired absence of spleen: Secondary | ICD-10-CM | POA: Insufficient documentation

## 2020-01-22 DIAGNOSIS — I080 Rheumatic disorders of both mitral and aortic valves: Secondary | ICD-10-CM | POA: Diagnosis not present

## 2020-01-22 DIAGNOSIS — Z79899 Other long term (current) drug therapy: Secondary | ICD-10-CM | POA: Insufficient documentation

## 2020-01-22 DIAGNOSIS — Z1611 Resistance to penicillins: Secondary | ICD-10-CM | POA: Diagnosis not present

## 2020-01-22 DIAGNOSIS — Z96652 Presence of left artificial knee joint: Secondary | ICD-10-CM | POA: Insufficient documentation

## 2020-01-22 DIAGNOSIS — N183 Chronic kidney disease, stage 3 unspecified: Secondary | ICD-10-CM | POA: Diagnosis not present

## 2020-01-22 DIAGNOSIS — Z89411 Acquired absence of right great toe: Secondary | ICD-10-CM | POA: Diagnosis not present

## 2020-01-22 DIAGNOSIS — B9562 Methicillin resistant Staphylococcus aureus infection as the cause of diseases classified elsewhere: Secondary | ICD-10-CM | POA: Insufficient documentation

## 2020-01-22 DIAGNOSIS — Z833 Family history of diabetes mellitus: Secondary | ICD-10-CM | POA: Insufficient documentation

## 2020-01-22 DIAGNOSIS — M86271 Subacute osteomyelitis, right ankle and foot: Secondary | ICD-10-CM | POA: Diagnosis not present

## 2020-01-22 DIAGNOSIS — Z1623 Resistance to quinolones and fluoroquinolones: Secondary | ICD-10-CM | POA: Insufficient documentation

## 2020-01-22 DIAGNOSIS — Z89421 Acquired absence of other right toe(s): Secondary | ICD-10-CM | POA: Insufficient documentation

## 2020-01-22 DIAGNOSIS — Z7982 Long term (current) use of aspirin: Secondary | ICD-10-CM | POA: Insufficient documentation

## 2020-01-22 DIAGNOSIS — M1990 Unspecified osteoarthritis, unspecified site: Secondary | ICD-10-CM | POA: Insufficient documentation

## 2020-01-22 DIAGNOSIS — Z1629 Resistance to other single specified antibiotic: Secondary | ICD-10-CM | POA: Insufficient documentation

## 2020-01-22 DIAGNOSIS — Z791 Long term (current) use of non-steroidal anti-inflammatories (NSAID): Secondary | ICD-10-CM | POA: Insufficient documentation

## 2020-01-22 DIAGNOSIS — E785 Hyperlipidemia, unspecified: Secondary | ICD-10-CM | POA: Insufficient documentation

## 2020-01-22 HISTORY — PX: AMPUTATION: SHX166

## 2020-01-22 LAB — BASIC METABOLIC PANEL
Anion gap: 10 (ref 5–15)
BUN: 22 mg/dL — ABNORMAL HIGH (ref 6–20)
CO2: 27 mmol/L (ref 22–32)
Calcium: 9.5 mg/dL (ref 8.9–10.3)
Chloride: 102 mmol/L (ref 98–111)
Creatinine, Ser: 1.48 mg/dL — ABNORMAL HIGH (ref 0.61–1.24)
GFR calc Af Amer: 59 mL/min — ABNORMAL LOW (ref >60)
GFR calc non Af Amer: 51 mL/min — ABNORMAL LOW (ref >60)
Glucose, Bld: 90 mg/dL (ref 70–99)
Potassium: 3.4 mmol/L — ABNORMAL LOW (ref 3.5–5.1)
Sodium: 139 mmol/L (ref 135–145)

## 2020-01-22 LAB — SURGICAL PCR SCREEN
MRSA, PCR: POSITIVE — AB
Staphylococcus aureus: POSITIVE — AB

## 2020-01-22 LAB — CBC
HCT: 41.3 % (ref 39.0–52.0)
Hemoglobin: 13.6 g/dL (ref 13.0–17.0)
MCH: 32 pg (ref 26.0–34.0)
MCHC: 32.9 g/dL (ref 30.0–36.0)
MCV: 97.2 fL (ref 80.0–100.0)
Platelets: 334 10*3/uL (ref 150–400)
RBC: 4.25 MIL/uL (ref 4.22–5.81)
RDW: 13.6 % (ref 11.5–15.5)
WBC: 8.9 10*3/uL (ref 4.0–10.5)
nRBC: 0 % (ref 0.0–0.2)

## 2020-01-22 LAB — GLUCOSE, CAPILLARY
Glucose-Capillary: 82 mg/dL (ref 70–99)
Glucose-Capillary: 102 mg/dL — ABNORMAL HIGH (ref 70–99)

## 2020-01-22 SURGERY — AMPUTATION, FOOT, RAY
Anesthesia: Monitor Anesthesia Care | Laterality: Right

## 2020-01-22 MED ORDER — ACETAMINOPHEN 325 MG PO TABS
325.0000 mg | ORAL_TABLET | Freq: Four times a day (QID) | ORAL | Status: DC | PRN
  Administered 2020-01-23: 650 mg via ORAL
  Filled 2020-01-22: qty 2

## 2020-01-22 MED ORDER — LIDOCAINE 2% (20 MG/ML) 5 ML SYRINGE
INTRAMUSCULAR | Status: DC | PRN
  Administered 2020-01-22: 60 mg via INTRAVENOUS

## 2020-01-22 MED ORDER — HYDROMORPHONE HCL 1 MG/ML IJ SOLN
0.5000 mg | INTRAMUSCULAR | Status: DC | PRN
  Administered 2020-01-22 – 2020-01-23 (×3): 0.5 mg via INTRAVENOUS
  Filled 2020-01-22 (×3): qty 1

## 2020-01-22 MED ORDER — GABAPENTIN 300 MG PO CAPS
600.0000 mg | ORAL_CAPSULE | Freq: Two times a day (BID) | ORAL | Status: DC
  Administered 2020-01-22 – 2020-01-23 (×2): 600 mg via ORAL
  Filled 2020-01-22 (×2): qty 2

## 2020-01-22 MED ORDER — ONDANSETRON HCL 4 MG PO TABS: 4.0000 mg | ORAL_TABLET | Freq: Four times a day (QID) | ORAL | Status: DC | PRN

## 2020-01-22 MED ORDER — ATORVASTATIN CALCIUM 10 MG PO TABS
10.0000 mg | ORAL_TABLET | Freq: Every day | ORAL | Status: DC
  Administered 2020-01-23: 10 mg via ORAL
  Filled 2020-01-22: qty 1

## 2020-01-22 MED ORDER — PENICILLIN V POTASSIUM 250 MG PO TABS
500.0000 mg | ORAL_TABLET | Freq: Two times a day (BID) | ORAL | Status: DC
  Administered 2020-01-22 – 2020-01-23 (×3): 500 mg via ORAL
  Filled 2020-01-22 (×3): qty 2

## 2020-01-22 MED ORDER — CHLORHEXIDINE GLUCONATE 0.12 % MT SOLN
15.0000 mL | Freq: Once | OROMUCOSAL | Status: AC
  Administered 2020-01-22: 15 mL via OROMUCOSAL
  Filled 2020-01-22: qty 15

## 2020-01-22 MED ORDER — FENTANYL CITRATE (PF) 250 MCG/5ML IJ SOLN
INTRAMUSCULAR | Status: AC
  Filled 2020-01-22: qty 5

## 2020-01-22 MED ORDER — PROMETHAZINE HCL 25 MG/ML IJ SOLN: 6.2500 mg | INTRAMUSCULAR | Status: DC | PRN

## 2020-01-22 MED ORDER — 0.9 % SODIUM CHLORIDE (POUR BTL) OPTIME
TOPICAL | Status: DC | PRN
  Administered 2020-01-22: 1000 mL

## 2020-01-22 MED ORDER — HYDROMORPHONE HCL 1 MG/ML IJ SOLN: 0.2500 mg | INTRAMUSCULAR | Status: DC | PRN

## 2020-01-22 MED ORDER — SODIUM CHLORIDE 0.9 % IV SOLN: INTRAVENOUS | Status: DC

## 2020-01-22 MED ORDER — METOCLOPRAMIDE HCL 5 MG/ML IJ SOLN: 5.0000 mg | Freq: Three times a day (TID) | INTRAMUSCULAR | Status: DC | PRN

## 2020-01-22 MED ORDER — HYDROMORPHONE HCL 2 MG PO TABS
2.0000 mg | ORAL_TABLET | Freq: Four times a day (QID) | ORAL | Status: DC | PRN
  Administered 2020-01-22: 2 mg via ORAL
  Filled 2020-01-22: qty 1

## 2020-01-22 MED ORDER — LIDOCAINE 2% (20 MG/ML) 5 ML SYRINGE
INTRAMUSCULAR | Status: AC
  Filled 2020-01-22: qty 5

## 2020-01-22 MED ORDER — VANCOMYCIN HCL IN DEXTROSE 1-5 GM/200ML-% IV SOLN
1000.0000 mg | Freq: Two times a day (BID) | INTRAVENOUS | Status: AC
  Administered 2020-01-22: 1000 mg via INTRAVENOUS
  Filled 2020-01-22: qty 200

## 2020-01-22 MED ORDER — ONDANSETRON HCL 4 MG/2ML IJ SOLN
INTRAMUSCULAR | Status: AC
  Filled 2020-01-22: qty 2

## 2020-01-22 MED ORDER — MIDAZOLAM HCL 2 MG/2ML IJ SOLN
INTRAMUSCULAR | Status: AC
  Filled 2020-01-22: qty 2

## 2020-01-22 MED ORDER — FENTANYL CITRATE (PF) 100 MCG/2ML IJ SOLN
INTRAMUSCULAR | Status: AC
  Administered 2020-01-22: 100 ug via INTRAVENOUS
  Filled 2020-01-22: qty 2

## 2020-01-22 MED ORDER — ASPIRIN EC 81 MG PO TBEC
81.0000 mg | DELAYED_RELEASE_TABLET | Freq: Every day | ORAL | Status: DC
  Administered 2020-01-22 – 2020-01-23 (×2): 81 mg via ORAL
  Filled 2020-01-22 (×2): qty 1

## 2020-01-22 MED ORDER — VITAMIN B-6 100 MG PO TABS
100.0000 mg | ORAL_TABLET | Freq: Every day | ORAL | Status: DC
  Administered 2020-01-22 – 2020-01-23 (×2): 100 mg via ORAL
  Filled 2020-01-22 (×2): qty 1

## 2020-01-22 MED ORDER — LISINOPRIL 5 MG PO TABS
5.0000 mg | ORAL_TABLET | Freq: Every day | ORAL | Status: DC
  Administered 2020-01-22 – 2020-01-23 (×2): 5 mg via ORAL
  Filled 2020-01-22 (×2): qty 1

## 2020-01-22 MED ORDER — MIDAZOLAM HCL 2 MG/2ML IJ SOLN: 0.5000 mg | Freq: Once | INTRAMUSCULAR | Status: DC | PRN

## 2020-01-22 MED ORDER — MIDAZOLAM HCL 2 MG/2ML IJ SOLN
INTRAMUSCULAR | Status: AC
  Administered 2020-01-22: 1 mg via INTRAVENOUS
  Filled 2020-01-22: qty 2

## 2020-01-22 MED ORDER — ADULT MULTIVITAMIN W/MINERALS CH
1.0000 | ORAL_TABLET | Freq: Every day | ORAL | Status: DC
  Administered 2020-01-22 – 2020-01-23 (×2): 1 via ORAL
  Filled 2020-01-22 (×2): qty 1

## 2020-01-22 MED ORDER — EPHEDRINE SULFATE-NACL 50-0.9 MG/10ML-% IV SOSY
PREFILLED_SYRINGE | INTRAVENOUS | Status: DC | PRN
  Administered 2020-01-22 (×2): 10 mg via INTRAVENOUS

## 2020-01-22 MED ORDER — AMLODIPINE BESYLATE 10 MG PO TABS
10.0000 mg | ORAL_TABLET | Freq: Every day | ORAL | Status: DC
  Administered 2020-01-22 – 2020-01-23 (×2): 10 mg via ORAL
  Filled 2020-01-22 (×2): qty 1

## 2020-01-22 MED ORDER — DOCUSATE SODIUM 100 MG PO CAPS
100.0000 mg | ORAL_CAPSULE | Freq: Two times a day (BID) | ORAL | Status: DC
  Administered 2020-01-22 – 2020-01-23 (×3): 100 mg via ORAL
  Filled 2020-01-22 (×3): qty 1

## 2020-01-22 MED ORDER — ONDANSETRON HCL 4 MG/2ML IJ SOLN
INTRAMUSCULAR | Status: DC | PRN
  Administered 2020-01-22: 4 mg via INTRAVENOUS

## 2020-01-22 MED ORDER — PROPOFOL 500 MG/50ML IV EMUL
INTRAVENOUS | Status: DC | PRN
  Administered 2020-01-22: 100 ug/kg/min via INTRAVENOUS

## 2020-01-22 MED ORDER — FENTANYL CITRATE (PF) 100 MCG/2ML IJ SOLN: 100.0000 ug | Freq: Once | INTRAMUSCULAR | Status: AC

## 2020-01-22 MED ORDER — EPHEDRINE 5 MG/ML INJ
INTRAVENOUS | Status: AC
  Filled 2020-01-22: qty 10

## 2020-01-22 MED ORDER — MIDAZOLAM HCL 2 MG/2ML IJ SOLN: 1.0000 mg | Freq: Once | INTRAMUSCULAR | Status: AC

## 2020-01-22 MED ORDER — ONDANSETRON HCL 4 MG/2ML IJ SOLN: 4.0000 mg | Freq: Four times a day (QID) | INTRAMUSCULAR | Status: DC | PRN

## 2020-01-22 MED ORDER — TIMOLOL MALEATE 0.5 % OP SOLN
1.0000 [drp] | Freq: Two times a day (BID) | OPHTHALMIC | Status: DC
  Administered 2020-01-22 – 2020-01-23 (×2): 1 [drp] via OPHTHALMIC
  Filled 2020-01-22: qty 5

## 2020-01-22 MED ORDER — MEPERIDINE HCL 25 MG/ML IJ SOLN: 6.2500 mg | INTRAMUSCULAR | Status: DC | PRN

## 2020-01-22 MED ORDER — LACTATED RINGERS IV SOLN: INTRAVENOUS | Status: DC

## 2020-01-22 MED ORDER — ROPIVACAINE HCL 7.5 MG/ML IJ SOLN
INTRAMUSCULAR | Status: DC | PRN
  Administered 2020-01-22: 40 mL

## 2020-01-22 MED ORDER — METOCLOPRAMIDE HCL 5 MG PO TABS: 5.0000 mg | ORAL_TABLET | Freq: Three times a day (TID) | ORAL | Status: DC | PRN

## 2020-01-22 MED ORDER — CEFAZOLIN SODIUM-DEXTROSE 2-4 GM/100ML-% IV SOLN
2.0000 g | INTRAVENOUS | Status: AC
  Administered 2020-01-22: 2 g via INTRAVENOUS
  Filled 2020-01-22: qty 100

## 2020-01-22 MED ORDER — ORAL CARE MOUTH RINSE: 15.0000 mL | Freq: Once | OROMUCOSAL | Status: AC

## 2020-01-22 SURGICAL SUPPLY — 23 items
BLADE SURG 21 STRL SS (BLADE) ×3 IMPLANT
BNDG COHESIVE 4X5 TAN STRL (GAUZE/BANDAGES/DRESSINGS) ×3 IMPLANT
COVER SURGICAL LIGHT HANDLE (MISCELLANEOUS) ×3 IMPLANT
DRAPE U-SHAPE 47X51 STRL (DRAPES) ×6 IMPLANT
DRESSING PEEL AND PLC PRVNA 13 (GAUZE/BANDAGES/DRESSINGS) ×1 IMPLANT
DRSG PEEL AND PLACE PREVENA 13 (GAUZE/BANDAGES/DRESSINGS) ×3
DURAPREP 26ML APPLICATOR (WOUND CARE) ×3 IMPLANT
ELECT REM PT RETURN 9FT ADLT (ELECTROSURGICAL) ×3
ELECTRODE REM PT RTRN 9FT ADLT (ELECTROSURGICAL) ×1 IMPLANT
GLOVE BIOGEL PI IND STRL 9 (GLOVE) ×1 IMPLANT
GLOVE BIOGEL PI INDICATOR 9 (GLOVE) ×2
GLOVE SURG ORTHO 9.0 STRL STRW (GLOVE) ×3 IMPLANT
GOWN STRL REUS W/ TWL XL LVL3 (GOWN DISPOSABLE) ×2 IMPLANT
GOWN STRL REUS W/TWL XL LVL3 (GOWN DISPOSABLE) ×6
KIT BASIN OR (CUSTOM PROCEDURE TRAY) ×3 IMPLANT
KIT DRSG PREVENA PLUS 7DAY 125 (MISCELLANEOUS) ×3 IMPLANT
KIT TURNOVER KIT B (KITS) ×3 IMPLANT
NS IRRIG 1000ML POUR BTL (IV SOLUTION) ×3 IMPLANT
PACK ORTHO EXTREMITY (CUSTOM PROCEDURE TRAY) ×3 IMPLANT
PAD ARMBOARD 7.5X6 YLW CONV (MISCELLANEOUS) ×6 IMPLANT
STOCKINETTE IMPERVIOUS LG (DRAPES) IMPLANT
SUT ETHILON 2 0 PSLX (SUTURE) ×3 IMPLANT
TOWEL GREEN STERILE (TOWEL DISPOSABLE) ×3 IMPLANT

## 2020-01-22 NOTE — Op Note (Signed)
01/22/2020  10:34 AM  PATIENT:  Cory Marks    PRE-OPERATIVE DIAGNOSIS:  Abscess, Osteomyelitis Right Foot  POST-OPERATIVE DIAGNOSIS:  Same  PROCEDURE:  RIGHT FOOT 5TH RAY AMPUTATION Local tissue rearrangement for wound closure 13 x 4 cm. Application of 13 cm Prevena wound VAC.  SURGEON:  Newt Minion, MD  PHYSICIAN ASSISTANT:None ANESTHESIA:   General  PREOPERATIVE INDICATIONS:  [name] is a  59 y.o. male with a diagnosis of Abscess, Osteomyelitis Right Foot who failed conservative measures and elected for surgical management.    The risks benefits and alternatives were discussed with the patient preoperatively including but not limited to the risks of infection, bleeding, nerve injury, cardiopulmonary complications, the need for revision surgery, among others, and the patient was willing to proceed.  OPERATIVE IMPLANTS: 13 cm Prevena wound VAC  @ENCIMAGES @  OPERATIVE FINDINGS: Abscess with destructive bony changes fifth metatarsal. Tissue sent for cultures  OPERATIVE PROCEDURE: Patient was brought the operating room after undergoing an ankle block.  After adequate levels anesthesia were obtained patient's right lower extremity was prepped using DuraPrep draped into a sterile field a timeout was called.  Elliptical incision was made around the ulcerative tissue this was carried sharply down to bone.  Necrotic bone abscess and necrotic tissue was sent in 1 block of tissue for cultures.  This left a wound that was 13 x 4 cm.  The entire fifth metatarsal was resected due to the extension of the infection.  The soft tissue was was sharply resected back to healthy viable tissue.  The wound was irrigated with normal saline.  Local tissue rearrangement was used to close the wound 13 x 4 cm.  A Prevena wound VAC was applied this had a good suction fit patient was taken the PACU in stable condition.   DISCHARGE PLANNING:  Antibiotic duration: Continue antibiotics with  vancomycin nasal swab positive for staph  Weightbearing: Nonweightbearing on the right  Pain medication: Opioid pathway  Dressing care/ Wound VAC: Continue wound VAC for 1 week  Ambulatory devices: Plan for walker  Discharge to: Overnight observation with discharge to home  Follow-up: In the office 1 week post operative.

## 2020-01-22 NOTE — Anesthesia Procedure Notes (Signed)
Anesthesia Regional Block: Ankle block   Pre-Anesthetic Checklist: ,, timeout performed, Correct Patient, Correct Site, Correct Laterality, Correct Procedure, Correct Position, site marked, Risks and benefits discussed,  Surgical consent,  Pre-op evaluation,  At surgeon's request and post-op pain management  Laterality: Right and Lower  Prep: chloraprep       Needles:  Injection technique: Single-shot  Needle Type: Quincke     Needle Length: 4cm  Needle Gauge: 25     Additional Needles:   (perineural infiltration)  Narrative:  Start time: 01/22/2020 9:38 AM End time: 01/22/2020 9:43 AM Injection made incrementally with aspirations every 5 mL.  Performed by: Personally  Anesthesiologist: Annye Asa, MD  Additional Notes: Pt identified in Holding room.  Monitors applied. Working IV access confirmed. Sterile prep R ankle.  #25ga Quincke perineural infiltration around deep and sup peroneal, saph, sural and post tib nerves.  Total 40cc 0.75% ropivacaine injected incrementally after negative test dose.  Patient asymptomatic, VSS, no heme aspirated, tolerated well.  Jenita Seashore, MD

## 2020-01-22 NOTE — Transfer of Care (Signed)
Immediate Anesthesia Transfer of Care Note  Patient: Cory Marks  Procedure(s) Performed: RIGHT FOOT 5TH RAY AMPUTATION (Right )  Patient Location: PACU  Anesthesia Type:MAC, regional  Level of Consciousness: awake, alert  and oriented  Airway & Oxygen Therapy: Patient Spontanous Breathing and Patient connected to face mask oxygen  Post-op Assessment: Report given to RN and Post -op Vital signs reviewed and stable  Post vital signs: Reviewed and stable  Last Vitals:  Vitals Value Taken Time  BP 97/76 01/22/20 1030  Temp    Pulse 45 01/22/20 1031  Resp 15 01/22/20 1031  SpO2 97 % 01/22/20 1031  Vitals shown include unvalidated device data.  Last Pain:  Vitals:   01/22/20 0950  TempSrc:   PainSc: 0-No pain         Complications: No complications documented.

## 2020-01-22 NOTE — Anesthesia Preprocedure Evaluation (Addendum)
Anesthesia Evaluation  Patient identified by MRN, date of birth, ID band Patient awake    Reviewed: Allergy & Precautions, NPO status , Patient's Chart, lab work & pertinent test results  History of Anesthesia Complications Negative for: history of anesthetic complications  Airway Mallampati: II  TM Distance: >3 FB Neck ROM: Full    Dental  (+) Edentulous Upper, Dental Advisory Given, Poor Dentition   Pulmonary neg pulmonary ROS,  01/20/2020 SARS coronavirus NEG   breath sounds clear to auscultation       Cardiovascular hypertension, Pt. on medications (-) angina+ CAD and + Peripheral Vascular Disease  + dysrhythmias (s/p ablation) Atrial Fibrillation  Rhythm:Regular Rate:Normal  '16 ECHO: EF 40- 45%. Mild diffuse hypokinesis.  - Aortic valve: There was mild regurgitation.  - Mitral valve: There was mild regurgitation    Neuro/Psych negative neurological ROS     GI/Hepatic negative GI ROS, Neg liver ROS,   Endo/Other  diabetes (no meds presently, glu 82)  Renal/GU Renal InsufficiencyRenal disease (creat 1.48)     Musculoskeletal   Abdominal   Peds  Hematology S/p stem cell transplant for mantle cell   Anesthesia Other Findings   Reproductive/Obstetrics                            Anesthesia Physical Anesthesia Plan  ASA: III  Anesthesia Plan: MAC and Regional   Post-op Pain Management:    Induction:   PONV Risk Score and Plan: 1 and Ondansetron  Airway Management Planned: Natural Airway and Simple Face Mask  Additional Equipment: None  Intra-op Plan:   Post-operative Plan:   Informed Consent: I have reviewed the patients History and Physical, chart, labs and discussed the procedure including the risks, benefits and alternatives for the proposed anesthesia with the patient or authorized representative who has indicated his/her understanding and acceptance.     Dental  advisory given  Plan Discussed with: CRNA and Surgeon  Anesthesia Plan Comments: (Plan routine monitors, ankle block with sedation)       Anesthesia Quick Evaluation

## 2020-01-22 NOTE — Plan of Care (Signed)
  Problem: Education: Goal: Knowledge of General Education information will improve Description: Including pain rating scale, medication(s)/side effects and non-pharmacologic comfort measures Outcome: Progressing   Problem: Clinical Measurements: Goal: Respiratory complications will improve Outcome: Progressing Note: Room air   Problem: Nutrition: Goal: Adequate nutrition will be maintained Outcome: Progressing   Problem: Coping: Goal: Level of anxiety will decrease Outcome: Progressing   Problem: Elimination: Goal: Will not experience complications related to urinary retention Outcome: Progressing   Problem: Pain Managment: Goal: General experience of comfort will improve Outcome: Progressing Note: Treated for pain in right foot once with PO dilaudid

## 2020-01-22 NOTE — Anesthesia Procedure Notes (Signed)
Procedure Name: MAC Date/Time: 01/22/2020 9:56 AM Performed by: Alain Marion, CRNA Pre-anesthesia Checklist: Patient identified, Emergency Drugs available, Suction available and Patient being monitored Patient Re-evaluated:Patient Re-evaluated prior to induction Oxygen Delivery Method: Simple face mask Placement Confirmation: positive ETCO2

## 2020-01-22 NOTE — H&P (Signed)
Cory Marks is an 59 y.o. male.   Chief Complaint: Right Foot Osteomyelitis HPI: The patient is a pleasant 59 year old gentleman who is 1 month status post right transmetatarsal amputation.  His surgical sutures were removed last week.  He was doing well but over the weekend developed 2 new areas of drainage on the lateral side of his foot.  He also noticed some skin maceration.  He denies any fever or chills.  He is on chronic penicillin for being status post a splenectomy  Past Medical History:  Diagnosis Date  . Arthritis   . Bell's palsy    left eye droops a little  . CAD (coronary artery disease)   . Cancer [facility]) 2012   Mantle Cell Cancer- S/P stem cell transplant  . CKD (chronic kidney disease) stage 3, GFR 30-59 ml/min    per lov with pcp,  last seen at [facility] january 2020 per patient, stable per patient   . Diabetes mellitus without complication (HCC)    hx of lost weight due to cancer not on medications since 2014   . Dysrhythmia    atrial fib and atrial flutter, no repeat of dysrhymia since ablation in 2018 ,   . Eye injury    left eye hit by a wood chip early june 2020, lov for f/u with opthamologist 6-17 , drained a pustule,  progress notes mention eye gradually improving with application of antibiotic  eye drops   . History of chemotherapy last tx 2014  . History of diabetes mellitus NONE SINCE 2012 CANCER  . History of splenectomy   . Hyperlipemia   . Hypertension   . Neuromuscular disorder ([place])    peripheral neuropathy  . Neuropathy    right leg due to football injury  . Polysubstance abuse ([place])   . Sinus bradycardia     Past Surgical History:  Procedure Laterality Date  . A-FLUTTER ABLATION N/A 09/12/2016   Procedure: A-Flutter Ablation;  Surgeon: Will Meredith Leeds, MD;  Location: [place] [facility];  Service: Cardiovascular;  Laterality: N/A;  . AMPUTATION Right 10/20/2014   Procedure: AMPUTATION RIGHT GREAT TOE;  Surgeon: Gaynelle Arabian, MD;   Location: [facility];  Service: Orthopedics;  Laterality: Right;  . AMPUTATION Right 12/25/2014   Procedure: RIGHT 2ND TOE AMPUTATION ;  Surgeon: Wylene Simmer, MD;  Location: [place];  Service: Orthopedics;  Laterality: Right;  . AMPUTATION Right 12/20/2019   Procedure: RIGHT FOOT AMPUTATION TOES;  Surgeon: Newt Minion, MD;  Location: [place];  Service: Orthopedics;  Laterality: Right;  . edentulation N/A y-2   prior to transplant  . EYE SURGERY Left    caratct  . REVERSE SHOULDER ARTHROPLASTY Right 01/31/2019   Procedure: REVERSE SHOULDER ARTHROPLASTY;  Surgeon: Justice Britain, MD;  Location: [facility];  Service: Orthopedics;  Laterality: Right;  . right knee surgery Right    nerve graft back of leg  . SPLENECTOMY, TOTAL     mantle cell lymphoma  . TENOLYSIS Right 12/25/2014   Procedure: RIGHT 3,4,5 FLEXOR TENDON RELEASE ;  Surgeon: Wylene Simmer, MD;  Location: [place];  Service: Orthopedics;  Laterality: Right;  . TOTAL KNEE ARTHROPLASTY Left 01/30/2017   Procedure: LEFT TOTAL KNEE ARTHROPLASTY;  Surgeon: Gaynelle Arabian, MD;  Location: [facility];  Service: Orthopedics;  Laterality: Left;    Family History  Problem Relation Age of Onset  . Hypertension Mother   . Diabetes Mother   . Cancer Father  pancreatic cancer  . Diabetes Father   . Heart disease Father   . Diabetes Sister    Social History:  reports that he has never smoked. He has never used smokeless tobacco. He reports current alcohol use of about 6.0 standard drinks of alcohol per week. He reports that he does not use drugs.  Allergies:  Allergies  Allergen Reactions  . Oxycodone Itching and Palpitations    Medications Prior to Admission  Medication Sig Dispense Refill  . acetaminophen (TYLENOL) 325 MG tablet Take 650-975 mg by mouth every 6 (six) hours as needed for mild pain or headache.    Marland Kitchen amLODipine (NORVASC) 10 MG tablet Take 1 tablet (10 mg total) by mouth every morning. (Patient  taking differently: Take 10 mg by mouth daily. ) 90 tablet 1  . aspirin EC 81 MG EC tablet Take 1 tablet (81 mg total) by mouth daily.    Marland Kitchen atorvastatin (LIPITOR) 10 MG tablet Take 10 mg by mouth daily.     . diclofenac Sodium (VOLTAREN) 1 % GEL Apply 2 g topically daily as needed (pain).     Marland Kitchen ELDERBERRY PO Take 1 tablet by mouth daily.    Marland Kitchen gabapentin (NEURONTIN) 300 MG capsule Take 600 mg by mouth 2 (two) times daily.     Marland Kitchen HYDROmorphone (DILAUDID) 2 MG tablet Take 2 mg by mouth at bedtime as needed for moderate pain or severe pain.     Marland Kitchen lisinopril (ZESTRIL) 5 MG tablet Take 5 mg by mouth daily.     . Multiple Vitamin (MULTIVITAMIN WITH MINERALS) TABS tablet Take 1 tablet by mouth daily.    . penicillin v potassium (VEETID) 500 MG tablet Take 500 mg by mouth 2 (two) times daily.     Marland Kitchen pyridOXINE (VITAMIN B-6) 100 MG tablet Take 100 mg by mouth daily.    . timolol (TIMOPTIC) 0.5 % ophthalmic solution Place 1 drop into the left eye 2 (two) times daily.     Marland Kitchen doxycycline (VIBRA-TABS) 100 MG tablet Take 1 tablet (100 mg total) by mouth 2 (two) times daily. 60 tablet 0    Results for orders placed or performed during the hospital encounter of 01/20/20 (from the past 48 hour(s))  SARS CORONAVIRUS 2 (TAT 6-24 HRS) Nasopharyngeal Nasopharyngeal Swab     Status: None   Collection Time: 01/20/20  3:10 PM   Specimen: Nasopharyngeal Swab  Result Value Ref Range   SARS Coronavirus 2 NEGATIVE NEGATIVE    Comment: (NOTE) SARS-CoV-2 target nucleic acids are NOT DETECTED.  The SARS-CoV-2 RNA is generally detectable in upper and lower respiratory specimens during the acute phase of infection. Negative results do not preclude SARS-CoV-2 infection, do not rule out co-infections with other pathogens, and should not be used as the sole basis for treatment or other patient management decisions. Negative results must be combined with clinical observations, patient history, and epidemiological information.  The expected result is Negative.  Fact Sheet for Patients: SugarRoll.be  Fact Sheet for Healthcare Providers: https://www.woods-mathews.com/  This test is not yet approved or cleared by the [place] FDA and  has been authorized for detection and/or diagnosis of SARS-CoV-2 by FDA under an Emergency Use Authorization (EUA). This EUA will remain  in effect (meaning this test can be used) for the duration of the COVID-19 declaration under Se ction 564(b)(1) of the Act, 21 U.S.C. section 360bbb-3(b)(1), unless the authorization is terminated or revoked sooner.  Performed at [facility], [place] [address]  [place], [place] [address]    XR Foot Complete Right  Result Date: 01/20/2020 X-rays of his right foot were completed today.  They demonstrate postsurgical changes consistent with transmetatarsal amputation.  He does have some lytic changes in the fifth metatarsal head consistent with osteomyelitis   Review of Systems  All other systems reviewed and are negative.   There were no vitals taken for this visit. Physical Exam  Patient is alert, oriented, no adenopathy, well-dressed, normal affect, normal respiratory effort. Focused examination of his right foot demonstrates moderate soft tissue swelling.  Area of transmetatarsal amputation has healed.  He does have a small pinpoint area to the far lateral and near the fifth distal metatarsal that is open with purulent drainage.  Along the lateral side of the foot there are 2 additional areas of drainage that probes deeply with necrotic tissue.  He has biphasic dorsalis pedis and posterior tibial tendon pulses were appreciated.  Lungs clear heart regular rate and rhythm Assessment/Plan Visit Diagnoses:  1. Acute hematogenous osteomyelitis of right foot [facility])     Plan: Patient was seen today by Dr. Sharol Given he has recommended a right fifth ray amputation.  Until then we will place him on  doxycycline plan for surgery in 2 days reviewed the risks of surgery with him he wishes to go forward.    Bevely Palmer Zethan Alfieri, PA 01/22/2020, 6:38 AM

## 2020-01-22 NOTE — Anesthesia Postprocedure Evaluation (Signed)
Anesthesia Post Note  Patient: Cory Marks  Procedure(s) Performed: RIGHT FOOT 5TH RAY AMPUTATION (Right )     Patient location during evaluation: PACU Anesthesia Type: Regional Level of consciousness: awake and alert, oriented and patient cooperative Pain management: pain level controlled Vital Signs Assessment: post-procedure vital signs reviewed and stable Respiratory status: spontaneous breathing, nonlabored ventilation and respiratory function stable Cardiovascular status: blood pressure returned to baseline and stable Postop Assessment: no apparent nausea or vomiting Anesthetic complications: no   No complications documented.  Last Vitals:  Vitals:   01/22/20 1126 01/22/20 1155  BP: (!) 127/94 (!) 126/98  Pulse: 75 (!) 41  Resp: 13 15  Temp: 36.7 C (!) 36.4 C  SpO2: 96% 100%    Last Pain:  Vitals:   01/22/20 1155  TempSrc: Axillary  PainSc:                  Lanyia Jewel,E. Alexanderjames Berg

## 2020-01-23 ENCOUNTER — Encounter (HOSPITAL_COMMUNITY): Payer: Self-pay | Admitting: Orthopedic Surgery

## 2020-01-23 DIAGNOSIS — E1169 Type 2 diabetes mellitus with other specified complication: Secondary | ICD-10-CM | POA: Diagnosis not present

## 2020-01-23 NOTE — Evaluation (Signed)
Physical Therapy Evaluation Patient Details Name: Cory Marks MRN: [id number] DOB: June 04, 1961 Today's Date: 01/23/2020   History of Present Illness  Pt is a 59 y.o. male with R foot osteoymelitis s/p R TMA 12/20/19, now readmitted 01/22/20 for R 5th ray amputation. PMH includes previous R toe amputations, CAD, CA, CKD III, DM2, HTN, neuropathy.    Clinical Impression  Pt presents with an overall decrease in functional mobility secondary to above. PTA, pt mod indep with RW since recent R foot sx 1 month ago; lives with mom who provides transportation. Educ on precautions, positioning, therex, and importance of mobility. Today, pt able to perform transfer and gait training with RW, progressing to supervision-level. Good ability to maintain RLE NWB precautions. From a mobility perspective, feel pt is safe for d/c home today. If to remain admitted, will continue to follow acutely.    Follow Up Recommendations Home health PT;Supervision - Intermittent (pt request)    Equipment Recommendations  None recommended by PT    Recommendations for Other Services       Precautions / Restrictions Precautions Precautions: Fall;Other (comment) Precaution Comments: R foot portable wound vac Restrictions Weight Bearing Restrictions: Yes RLE Weight Bearing: Non weight bearing      Mobility  Bed Mobility Overal bed mobility: Independent                Transfers Overall transfer level: Needs assistance Equipment used: Rolling walker (2 wheeled) Transfers: Sit to/from Stand Sit to Stand: Supervision         General transfer comment: Reliant on momentum to power into standing; good ability to maintain RLE NWB with sit<>stand  Ambulation/Gait Ambulation/Gait assistance: Min Gaffer (Feet): 20 Feet Assistive device: Rolling walker (2 wheeled)   Gait velocity: Decreased   General Gait Details: Slow, mostly steady gait with RW and intermittent min guard for  balance; good ability to maintain RLE NWB precautions with hop-to gait pattern  Stairs Stairs:  (Pt declined stair training; feels comfortable ascending 2 steps into home with RW, uses rail for steps up to kitchen)          Wheelchair Mobility    Modified Rankin (Stroke Patients Only)       Balance Overall balance assessment: Needs assistance   Sitting balance-Leahy Scale: Good       Standing balance-Leahy Scale: Poor Standing balance comment: Reliant on at least single UE support to maintain standing balance with RLE NWB                             Pertinent Vitals/Pain Pain Assessment: 0-10 Pain Score: 5  Pain Location: R foot Pain Descriptors / Indicators: Discomfort Pain Intervention(s): Monitored during session;Repositioned    Home Living Family/patient expects to be discharged to:: Private residence Living Arrangements: Parent Available Help at Discharge: Family Type of Home: House Home Access: Stairs to enter Entrance Stairs-Rails: None Entrance Stairs-Number of Steps: 2 Home Layout: Two level (kitchen is upstairs) Home Equipment: Environmental consultant - 2 wheels;Grab bars - tub/shower;Shower seat;Bedside commode;Wheelchair - manual Additional Comments: Plans to d/c to mother's home (67 y.o.) who does the driving. Home set-up info for her home    Prior Function Level of Independence: Independent with assistive device(s)         Comments: Has been mod indep with RW to maintain R foot NWB since sx 1 month ago. Has had no difficulty with stairs     Hand Dominance  Extremity/Trunk Assessment   Upper Extremity Assessment Upper Extremity Assessment: Overall WFL for tasks assessed (reports h/o rotator cuff repair causing wrist weakness)    Lower Extremity Assessment Lower Extremity Assessment: RLE deficits/detail RLE Deficits / Details: s/p R foot amputation; hip and knee functionally >3/5 throughout       Communication   Communication: No  difficulties  Cognition Arousal/Alertness: Awake/alert Behavior During Therapy: WFL for tasks assessed/performed Overall Cognitive Status: Within Functional Limits for tasks assessed                                 General Comments: WFL for simple tasks, not formally assessed      General Comments General comments (skin integrity, edema, etc.): Educ on edema control (elevation, ROM)    Exercises     Assessment/Plan    PT Assessment Patient needs continued PT services  PT Problem List Decreased activity tolerance;Decreased balance;Decreased mobility       PT Treatment Interventions DME instruction;Gait training;Stair training;Functional mobility training;Therapeutic activities;Therapeutic exercise;Balance training;Patient/family education    PT Goals (Current goals can be found in the Care Plan section)  Acute Rehab PT Goals Patient Stated Goal: Home today, would like follow-up with HHPT PT Goal Formulation: With patient Time For Goal Achievement: 02/06/20 Potential to Achieve Goals: Good    Frequency Min 3X/week   Barriers to discharge        Co-evaluation               AM-PAC PT "6 Clicks" Mobility  Outcome Measure Help needed turning from your back to your side while in a flat bed without using bedrails?: None Help needed moving from lying on your back to sitting on the side of a flat bed without using bedrails?: None Help needed moving to and from a bed to a chair (including a wheelchair)?: None Help needed standing up from a chair using your arms (e.g., wheelchair or bedside chair)?: None Help needed to walk in hospital room?: A Little Help needed climbing 3-5 steps with a railing? : A Little 6 Click Score: 22    End of Session   Activity Tolerance: Patient tolerated treatment well Patient left: in chair;with call bell/phone within reach;with chair alarm set Nurse Communication: Mobility status PT Visit Diagnosis: Other abnormalities of  gait and mobility (R26.89);Pain Pain - Right/Left: Right Pain - part of body: Ankle and joints of foot    Time: 0998-3382 PT Time Calculation (min) (ACUTE ONLY): 19 min   Charges:   PT Evaluation $PT Eval Low Complexity: [name], PT, DPT Acute Rehabilitation Services  Pager [phone] Office [place] 01/23/2020, 8:56 AM

## 2020-01-23 NOTE — Evaluation (Addendum)
Occupational Therapy Evaluation Patient Details Name: Cory Marks MRN: [id number] DOB: [birth year] Today's Date: 01/23/2020    History of Present Illness Pt is a 59 y.o. male with R foot osteoymelitis s/p R TMA 12/20/19, now readmitted 01/22/20 for R 5th ray amputation. PMH includes previous R toe amputations, CAD, CA, CKD III, DM2, HTN, neuropathy.   Clinical Impression   Prior to hospital admission, patient was living with family and was grossly Mod I with use of RW and NWB status on RLE from procedure listed above. Patient currently presents slightly below baseline level of function demonstrating set-up A overall for UB BADLs, Min guard to Min A for LB BADLs, and Min guard grossly for mobility and functional transfers with use of RW. Patient limited by decreased static/dynamic standing balance without UE support 2/2 NWB on RLE demonstrating increased risk of falls. Patient does not demonstrate need for acute skilled OT services. No follow-up OT recommended at this time. Please defer to PT note for additional recommendations.     Follow Up Recommendations  No OT follow up;Supervision/Assistance - 24 hour    Equipment Recommendations  None recommended by OT (Patient has all needed DME)    Recommendations for Other Services       Precautions / Restrictions Precautions Precautions: Fall;Other (comment) Precaution Comments: R foot portable wound vac Restrictions Weight Bearing Restrictions: Yes RLE Weight Bearing: Non weight bearing      Mobility Bed Mobility Overal bed mobility: Independent                Transfers Overall transfer level: Needs assistance Equipment used: Rolling walker (2 wheeled) Transfers: Sit to/from [facility] Sit to Stand: Supervision Stand pivot transfers: Supervision       General transfer comment: Reliant on momentum to power into standing; good ability to maintain RLE NWB with sit<>stand. Patient becomes off balance without  UE support on RW during tasks requiring bimanual manipulation.     Balance Overall balance assessment: Needs assistance Sitting-balance support: Feet supported (Single leg support) Sitting balance-Leahy Scale: Good       Standing balance-Leahy Scale: Poor Standing balance comment: Reliant on at least single UE support to maintain standing balance with RLE NWB                           ADL either performed or assessed with clinical judgement   ADL Overall ADL's : Needs assistance/impaired     Grooming: Min guard Grooming Details (indicate cue type and reason): Standing at sink w/ and w/o unilateral UE support on sink surface.          Upper Body Dressing : Set up Upper Body Dressing Details (indicate cue type and reason): To don UB clothing seated EOB Lower Body Dressing: Minimal assistance Lower Body Dressing Details (indicate cue type and reason): To maintain standing balance when hiking pants over hips without UE support Toilet Transfer: Min Psychiatric nurse Details (indicate cue type and reason): To BSC over toilet in bathroom Toileting- Clothing Manipulation and Hygiene: Minimal assistance Toileting - Clothing Manipulation Details (indicate cue type and reason): To maintain standing balance only     Functional mobility during ADLs: Min guard;Rolling walker       Vision Baseline Vision/History: Wears glasses Patient Visual Report: No change from baseline Vision Assessment?: No apparent visual deficits     Perception     Praxis      Pertinent Vitals/Pain Pain Assessment: 0-10 Pain  Score: 4  Pain Location: R foot Pain Descriptors / Indicators: Discomfort Pain Intervention(s): Monitored during session;Repositioned     Hand Dominance Right   Extremity/Trunk Assessment Upper Extremity Assessment Upper Extremity Assessment: Overall WFL for tasks assessed   Lower Extremity Assessment Lower Extremity Assessment: RLE deficits/detail RLE  Deficits / Details: s/p R foot amputation; hip and knee functionally >3/5 throughout       Communication Communication Communication: No difficulties   Cognition Arousal/Alertness: Awake/alert Behavior During Therapy: WFL for tasks assessed/performed Overall Cognitive Status: Impaired/Different from baseline Area of Impairment: Memory                     Memory: Decreased short-term memory         General Comments:  (Patient did not remember that RN have just given meds. )   General Comments  Education on assist from family to maintain standing balance with LB dressing tasks    Exercises     Shoulder Instructions      Home Living Family/patient expects to be discharged to:: Private residence Living Arrangements: Parent Available Help at Discharge: Family Type of Home: House Home Access: Stairs to enter Technical brewer of Steps: 2 Entrance Stairs-Rails: None Home Layout: Two level (kitchen is upstairs) Alternate Level Stairs-Number of Steps: 14 Alternate Level Stairs-Rails: Right Bathroom Shower/Tub: Teacher, early years/pre: Handicapped height (has BSC over toilet)     Home Equipment: Walker - 2 wheels;Grab bars - tub/shower;Shower seat;Bedside commode;Wheelchair - manual   Additional Comments: Plans to d/c to mother's home (30 y.o.) who does the driving. Home set-up info for her home. Sister in town for the week and able to assist.       Prior Functioning/Environment Level of Independence: Independent with assistive device(s)        Comments: Has been mod indep with RW to maintain R foot NWB since sx 1 month ago. Has had no difficulty with stairs        OT Problem List: Decreased strength;Impaired balance (sitting and/or standing)      OT Treatment/Interventions:      OT Goals(Current goals can be found in the care plan section) Acute Rehab OT Goals Patient Stated Goal: To return home  OT Frequency:     Barriers to D/C:             Co-evaluation              AM-PAC OT "6 Clicks" Daily Activity     Outcome Measure Help from another person eating meals?: None Help from another person taking care of personal grooming?: A Little Help from another person toileting, which includes using toliet, bedpan, or urinal?: A Little Help from another person bathing (including washing, rinsing, drying)?: A Little Help from another person to put on and taking off regular upper body clothing?: A Little Help from another person to put on and taking off regular lower body clothing?: A Little 6 Click Score: 19   End of Session Equipment Utilized During Treatment: Gait belt;Rolling walker  Activity Tolerance: Patient tolerated treatment well Patient left: in chair;with call bell/phone within reach;with chair alarm set  OT Visit Diagnosis: Unsteadiness on feet (R26.81);Repeated falls (R29.6)                Time: 2778-2423 OT Time Calculation (min): 23 min Charges:  OT General Charges $OT Visit: 1 Visit OT Evaluation $OT Eval Low Complexity: 1 Low  Kimbria Camposano H. OTR/L Supplemental OT, Department of  rehab services [phone]  Hartwell Vandiver R H.  01/23/2020, 10:20 AM

## 2020-01-23 NOTE — Progress Notes (Signed)
Patient is postop day 1 status post right foot fifth ray amputation he did have some difficulty with pain last night when his block wore off.  Otherwise he is doing well  Vital signs stable afebrile alert sitting in bed there is probably 25 cc in the [place].  Wound dressing is in place VAC is functioning  Discussed with patient today.  His sister from is here from another nurse but [place] and he is prepared to be discharged to her care today  Patient will need follow-up in 1 week in the office for wound VAC removal

## 2020-01-23 NOTE — Progress Notes (Signed)
Patient is restless in bed and keeps pulling out tubing from [place] and/or power cord.  Staff have to keep a sharp eye every time entering the room because patient is not aware of doing this.

## 2020-01-23 NOTE — Discharge Summary (Deleted)
Discharge Diagnoses:  Active Problems:   Subacute osteomyelitis, right ankle and foot ([place])   Acute hematogenous osteomyelitis of right foot ([place])   Surgeries: Procedure(s): RIGHT FOOT 5TH RAY AMPUTATION on 01/22/2020    Consultants:   Discharged Condition: Improved  Hospital Course: Cory Marks is an 59 y.o. male who was admitted 01/22/2020 with a chief complaint of right foot pain, with a final diagnosis of Abscess, Osteomyelitis Right Foot.  Patient was brought to the operating room on 01/22/2020 and underwent Procedure(s): RIGHT FOOT 5TH RAY AMPUTATION.    Patient was given perioperative antibiotics:  Anti-infectives (From admission, onward)   Start     Dose/Rate Route Frequency Ordered Stop   01/22/20 2200  vancomycin (VANCOCIN) IVPB 1000 mg/200 mL premix        1,000 mg 200 mL/hr over 60 Minutes Intravenous Every 12 hours 01/22/20 1134 01/22/20 2158   01/22/20 1300  penicillin v potassium (VEETID) tablet 500 mg     Discontinue     500 mg Oral 2 times daily 01/22/20 1134     01/22/20 0700  ceFAZolin (ANCEF) IVPB 2g/100 mL premix        2 g 200 mL/hr over 30 Minutes Intravenous On call to O.R. 01/22/20 0648 01/22/20 1001   01/22/20 0600  vancomycin (VANCOREADY) IVPB 1500 mg/300 mL        1,500 mg 150 mL/hr over 120 Minutes Intravenous 120 min pre-op 01/21/20 0823 01/22/20 1042    .  Patient was given sequential compression devices, early ambulation, and aspirin for DVT prophylaxis.  Recent vital signs:  Patient Vitals for the past 24 hrs:  BP Temp Temp src Pulse Resp SpO2  01/23/20 0510 93/73 97.7 F (36.5 C) Oral (!) 52 18 95 %  01/23/20 0000 96/75 98.7 F (37.1 C) Oral (!) 54 16 96 %  01/22/20 2048 115/86 98.7 F (37.1 C) Oral (!) 54 16 100 %  01/22/20 1650 104/80 98 F (36.7 C) Oral (!) 51 16 100 %  01/22/20 1155 (!) 126/98 (!) 97.5 F (36.4 C) Axillary (!) 41 15 100 %  01/22/20 1126 (!) 127/94 98 F (36.7 C) -- 75 13 96 %  01/22/20 1100 (!) 128/97 -- -- --  14 100 %  01/22/20 1045 133/84 -- -- (!) 49 16 100 %  01/22/20 1030 97/76 (!) 97.2 F (36.2 C) -- (!) 51 15 100 %  01/22/20 0950 114/77 -- -- (!) 45 12 98 %  01/22/20 0945 116/89 -- -- (!) 44 14 100 %  01/22/20 0940 134/89 -- -- (!) 46 12 100 %  .  Recent laboratory studies: No results found.  Discharge Medications:   Allergies as of 01/23/2020      Reactions   Oxycodone Itching, Palpitations      Medication List    STOP taking these medications   doxycycline 100 MG tablet Commonly known as: VIBRA-TABS     TAKE these medications   acetaminophen 325 MG tablet Commonly known as: TYLENOL Take 650-975 mg by mouth every 6 (six) hours as needed for mild pain or headache.   amLODipine 10 MG tablet Commonly known as: NORVASC Take 1 tablet (10 mg total) by mouth every morning. What changed: when to take this   aspirin 81 MG EC tablet Take 1 tablet (81 mg total) by mouth daily.   atorvastatin 10 MG tablet Commonly known as: LIPITOR Take 10 mg by mouth daily.   diclofenac Sodium 1 % Gel Commonly known as: VOLTAREN  Apply 2 g topically daily as needed (pain).   ELDERBERRY PO Take 1 tablet by mouth daily.   gabapentin 300 MG capsule Commonly known as: NEURONTIN Take 600 mg by mouth 2 (two) times daily.   HYDROmorphone 2 MG tablet Commonly known as: DILAUDID Take 2 mg by mouth at bedtime as needed for moderate pain or severe pain.   lisinopril 5 MG tablet Commonly known as: ZESTRIL Take 5 mg by mouth daily.   multivitamin with minerals Tabs tablet Take 1 tablet by mouth daily.   penicillin v potassium 500 MG tablet Commonly known as: VEETID Take 500 mg by mouth 2 (two) times daily.   pyridOXINE 100 MG tablet Commonly known as: VITAMIN B-6 Take 100 mg by mouth daily.   timolol 0.5 % ophthalmic solution Commonly known as: TIMOPTIC Place 1 drop into the left eye 2 (two) times daily.       Diagnostic Studies: XR Foot Complete Right  Result Date:  01/20/2020 X-rays of his right foot were completed today.  They demonstrate postsurgical changes consistent with transmetatarsal amputation.  He does have some lytic changes in the fifth metatarsal head consistent with osteomyelitis   Patient benefited maximally from their hospital stay and there were no complications.     Disposition: Discharge disposition: 01-Home or Self Care      Discharge Instructions    Call MD / Call 911   Complete by: As directed    If you experience chest pain or shortness of breath, CALL 911 and be transported to the hospital emergency room.  If you develope a fever above 101 F, pus (white drainage) or increased drainage or redness at the wound, or calf pain, call your surgeon's office.   Constipation Prevention   Complete by: As directed    Drink plenty of fluids.  Prune juice may be helpful.  You may use a stool softener, such as Colace (over the counter) 100 mg twice a day.  Use MiraLax (over the counter) for constipation as needed.   Diet - low sodium heart healthy   Complete by: As directed    Discharge instructions   Complete by: As directed    Keep Dressing Dry.   Nonweightbearing   Increase activity slowly as tolerated   Complete by: As directed    Negative Pressure Wound Therapy - Incisional   Complete by: As directed    Show patient how to attach prevena pump      Follow-up Information    Antony Sian, Bevely Palmer, PA In 1 week.   Specialty: Orthopedic Surgery Contact information: [address] [place] [address] [phone]                Signed: Bevely Palmer Amontae Ng 01/23/2020, 7:21 AM

## 2020-01-23 NOTE — Discharge Summary (Signed)
Discharge Diagnoses:  Active Problems:   Subacute osteomyelitis, right ankle and foot ([place])   Acute hematogenous osteomyelitis of right foot ([place])   Surgeries: Procedure(s): RIGHT FOOT 5TH RAY AMPUTATION on 01/22/2020    Consultants:   Discharged Condition: Improved  Hospital Course: Cory Marks is an 59 y.o. male who was admitted 01/22/2020 with a chief complaint of right foot osteomyelitis, with a final diagnosis of Abscess, Osteomyelitis Right Foot.  Patient was brought to the operating room on 01/22/2020 and underwent Procedure(s): RIGHT FOOT 5TH RAY AMPUTATION.    Patient was given perioperative antibiotics:  Anti-infectives (From admission, onward)   Start     Dose/Rate Route Frequency Ordered Stop   01/22/20 2200  vancomycin (VANCOCIN) IVPB 1000 mg/200 mL premix        1,000 mg 200 mL/hr over 60 Minutes Intravenous Every 12 hours 01/22/20 1134 01/22/20 2158   01/22/20 1300  penicillin v potassium (VEETID) tablet 500 mg     Discontinue     500 mg Oral 2 times daily 01/22/20 1134     01/22/20 0700  ceFAZolin (ANCEF) IVPB 2g/100 mL premix        2 g 200 mL/hr over 30 Minutes Intravenous On call to O.R. 01/22/20 0648 01/22/20 1001   01/22/20 0600  vancomycin (VANCOREADY) IVPB 1500 mg/300 mL        1,500 mg 150 mL/hr over 120 Minutes Intravenous 120 min pre-op 01/21/20 0823 01/22/20 1042    .  Patient was given sequential compression devices, early ambulation, and aspirin for DVT prophylaxis.  Recent vital signs:  Patient Vitals for the past 24 hrs:  BP Temp Temp src Pulse Resp SpO2  01/23/20 0510 93/73 97.7 F (36.5 C) Oral (!) 52 18 95 %  01/23/20 0000 96/75 98.7 F (37.1 C) Oral (!) 54 16 96 %  01/22/20 2048 115/86 98.7 F (37.1 C) Oral (!) 54 16 100 %  01/22/20 1650 104/80 98 F (36.7 C) Oral (!) 51 16 100 %  01/22/20 1155 (!) 126/98 (!) 97.5 F (36.4 C) Axillary (!) 41 15 100 %  01/22/20 1126 (!) 127/94 98 F (36.7 C) -- 75 13 96 %  01/22/20 1100 (!) 128/97  -- -- -- 14 100 %  01/22/20 1045 133/84 -- -- (!) 49 16 100 %  01/22/20 1030 97/76 (!) 97.2 F (36.2 C) -- (!) 51 15 100 %  01/22/20 0950 114/77 -- -- (!) 45 12 98 %  01/22/20 0945 116/89 -- -- (!) 44 14 100 %  01/22/20 0940 134/89 -- -- (!) 46 12 100 %  .  Recent laboratory studies: No results found.  Discharge Medications:   Allergies as of 01/23/2020      Reactions   Oxycodone Itching, Palpitations      Medication List    TAKE these medications   acetaminophen 325 MG tablet Commonly known as: TYLENOL Take 650-975 mg by mouth every 6 (six) hours as needed for mild pain or headache.   amLODipine 10 MG tablet Commonly known as: NORVASC Take 1 tablet (10 mg total) by mouth every morning. What changed: when to take this   aspirin 81 MG EC tablet Take 1 tablet (81 mg total) by mouth daily.   atorvastatin 10 MG tablet Commonly known as: LIPITOR Take 10 mg by mouth daily.   diclofenac Sodium 1 % Gel Commonly known as: VOLTAREN Apply 2 g topically daily as needed (pain).   doxycycline 100 MG tablet Commonly known as: VIBRA-TABS  Take 1 tablet (100 mg total) by mouth 2 (two) times daily.   ELDERBERRY PO Take 1 tablet by mouth daily.   gabapentin 300 MG capsule Commonly known as: NEURONTIN Take 600 mg by mouth 2 (two) times daily.   HYDROmorphone 2 MG tablet Commonly known as: DILAUDID Take 2 mg by mouth at bedtime as needed for moderate pain or severe pain.   lisinopril 5 MG tablet Commonly known as: ZESTRIL Take 5 mg by mouth daily.   multivitamin with minerals Tabs tablet Take 1 tablet by mouth daily.   penicillin v potassium 500 MG tablet Commonly known as: VEETID Take 500 mg by mouth 2 (two) times daily.   pyridOXINE 100 MG tablet Commonly known as: VITAMIN B-6 Take 100 mg by mouth daily.   timolol 0.5 % ophthalmic solution Commonly known as: TIMOPTIC Place 1 drop into the left eye 2 (two) times daily.       Diagnostic Studies: XR Foot Complete  Right  Result Date: 01/20/2020 X-rays of his right foot were completed today.  They demonstrate postsurgical changes consistent with transmetatarsal amputation.  He does have some lytic changes in the fifth metatarsal head consistent with osteomyelitis   Patient benefited maximally from their hospital stay and there were no complications.     Disposition: Discharge disposition: 01-Home or Self Care      Discharge Instructions    Call MD / Call 911   Complete by: As directed    If you experience chest pain or shortness of breath, CALL 911 and be transported to the hospital emergency room.  If you develope a fever above 101 F, pus (white drainage) or increased drainage or redness at the wound, or calf pain, call your surgeon's office.   Constipation Prevention   Complete by: As directed    Drink plenty of fluids.  Prune juice may be helpful.  You may use a stool softener, such as Colace (over the counter) 100 mg twice a day.  Use MiraLax (over the counter) for constipation as needed.   Diet - low sodium heart healthy   Complete by: As directed    Discharge instructions   Complete by: As directed    Keep Dressing Dry.   Nonweightbearing   Increase activity slowly as tolerated   Complete by: As directed    Negative Pressure Wound Therapy - Incisional   Complete by: As directed    Show patient how to attach prevena pump      Follow-up Information    Jaymes Hang, Bevely Palmer, PA In 1 week.   Specialty: Orthopedic Surgery Contact information: [address] [place] [address] [phone]                Signed: Bevely Palmer Kennia Vanvorst 01/23/2020, 7:59 AM

## 2020-01-23 NOTE — Progress Notes (Signed)
Discharge summary packet provided to pt. Explained and educated pt , and verbalized understanding of instructions. Questions/concerns answered satisfactorily. No complaints verbalized. Pt's sister is responsible of pt's transportation./

## 2020-01-23 NOTE — Plan of Care (Signed)
  Problem: Education: Goal: Knowledge of General Education information will improve Description: Including pain rating scale, medication(s)/side effects and non-pharmacologic comfort measures 01/23/2020 0813 by Rodell Perna, RN Outcome: Progressing 01/23/2020 0813 by Rodell Perna, RN Outcome: Progressing   Problem: Health Behavior/Discharge Planning: Goal: Ability to manage health-related needs will improve 01/23/2020 0813 by Rodell Perna, RN Outcome: Progressing 01/23/2020 0813 by Rodell Perna, RN Outcome: Progressing   Problem: Activity: Goal: Risk for activity intolerance will decrease 01/23/2020 0813 by Rodell Perna, RN Outcome: Progressing 01/23/2020 0813 by Rodell Perna, RN Outcome: Progressing   Problem: Pain Managment: Goal: General experience of comfort will improve 01/23/2020 0813 by Rodell Perna, RN Outcome: Progressing 01/23/2020 0813 by Rodell Perna, RN Outcome: Progressing

## 2020-01-27 LAB — AEROBIC/ANAEROBIC CULTURE W GRAM STAIN (SURGICAL/DEEP WOUND)

## 2020-01-27 NOTE — Progress Notes (Signed)
Called pt and lm on vm to advise. To call with questions.

## 2020-01-29 ENCOUNTER — Other Ambulatory Visit: Payer: Self-pay

## 2020-01-29 ENCOUNTER — Encounter: Payer: Self-pay | Admitting: Physician Assistant

## 2020-01-29 ENCOUNTER — Ambulatory Visit: Payer: Medicare HMO | Admitting: Physician Assistant

## 2020-01-29 ENCOUNTER — Ambulatory Visit (INDEPENDENT_AMBULATORY_CARE_PROVIDER_SITE_OTHER): Payer: Medicare HMO | Admitting: Physician Assistant

## 2020-01-29 VITALS — Ht 72.0 in | Wt 212.0 lb

## 2020-01-29 DIAGNOSIS — M869 Osteomyelitis, unspecified: Secondary | ICD-10-CM

## 2020-01-29 NOTE — Progress Notes (Signed)
Office Visit Note   Patient: Cory Marks           Date of Birth: [birth year]           MRN: [id number] Visit Date: 01/29/2020              Requested by: [name], MD [place],  [place] [id number] PCP: [name], MD  Chief Complaint  Patient presents with  . Right Foot - Routine Post Op    01/22/20 right foot 5th ray amputation       HPI: This is a pleasant 59 year old gentleman who is 1 week status post right fifth ray amputation status post transmetatarsal amputation.  His cultures grew out MRSA.  He is currently on doxycycline.  He states that 6 days ago when he first got home from the hospital the wound VAC began to beep it has been beeping since.  He has left it in place  Assessment & Plan: Visit Diagnoses: No diagnosis found.  Plan: Status post above procedure with massive amount of skin maceration.  This may be secondary to the wound VAC not functioning for 6 days and remaining in place.  Patient will begin Dial soap and water washing daily and applying a dry dressing.  We will continue on doxycycline.  He will follow-up with Dr. Sharol Given on Monday  Follow-Up Instructions: No follow-ups on file.   Ortho Exam  Patient is alert, oriented, no adenopathy, well-dressed, normal affect, normal respiratory effort. Focused examination of his right foot: Well approximated wound edges massive skin maceration with with foul smelling drainage on the top of his foot.  No frank necrosis.  He did have slightly muffled but biphasic pulses both dorsalis pedis and posterior tibial.  No cellulitis  Imaging: No results found. No images are attached to the encounter.  Labs: Lab Results  Component Value Date   HGBA1C 5.9 (H) 01/28/2019   HGBA1C 5.8 11/16/2017   HGBA1C 5.7 11/03/2016   ESRSEDRATE 8 12/19/2019   CRP 1.2 (H) 12/21/2019   CRP 1.5 (H) 12/20/2019   LABURIC 9.0 (H) 12/08/2010   LABURIC 10.2 (H) 12/02/2010   LABURIC 10.7 (H) 12/01/2010   REPTSTATUS  01/27/2020 FINAL 01/22/2020   GRAMSTAIN  01/22/2020    RARE WBC PRESENT,BOTH PMN AND MONONUCLEAR FEW GRAM POSITIVE COCCI IN PAIRS    CULT  01/22/2020    ABUNDANT METHICILLIN RESISTANT STAPHYLOCOCCUS AUREUS NO ANAEROBES ISOLATED Performed at [facility], [place] [address] [place], [place] [address]    LABORGA METHICILLIN RESISTANT STAPHYLOCOCCUS AUREUS 01/22/2020     Lab Results  Component Value Date   ALBUMIN 3.1 (L) 12/20/2019   ALBUMIN 3.7 12/19/2019   ALBUMIN 4.5 05/18/2018   LABURIC 9.0 (H) 12/08/2010   LABURIC 10.2 (H) 12/02/2010   LABURIC 10.7 (H) 12/01/2010    Lab Results  Component Value Date   MG 1.8 12/21/2019   MG 1.8 07/22/2014   No results found for: VD25OH  No results found for: PREALBUMIN CBC EXTENDED Latest Ref Rng & Units 01/22/2020 12/21/2019 12/20/2019  WBC 4.0 - 10.5 K/uL 8.9 11.3(H) 5.0  RBC 4.22 - 5.81 MIL/uL 4.25 4.37 4.45  HGB 13.0 - 17.0 g/dL 13.6 14.3 14.6  HCT 39 - 52 % 41.3 42.4 43.2  PLT 150 - 400 K/uL 334 308 282  NEUTROABS 1.7 - 7.7 K/uL - - -  LYMPHSABS 0.7 - 4.0 K/uL - - -     Body mass index is 28.75  kg/m.  Orders:  No orders of the defined types were placed in this encounter.  No orders of the defined types were placed in this encounter.    Procedures: No procedures performed  Clinical Data: No additional findings.  ROS:  All other systems negative, except as noted in the HPI. Review of Systems  Objective: Vital Signs: Ht 6' (1.829 m)   Wt 212 lb (96.2 kg)   BMI 28.75 kg/m   Specialty Comments:  No specialty comments available.  PMFS History: Patient Active Problem List   Diagnosis Date Noted  . Acute hematogenous osteomyelitis of right foot ([place]) 01/22/2020  . Cutaneous abscess of right foot   . S/P reverse total shoulder arthroplasty, right 01/31/2019  . Spinal stenosis of lumbar region 10/10/2018  . Oropharyngeal dysphagia 07/11/2018  . Pneumonia in aspergillosis ([place]) 07/11/2018  . Degeneration  of lumbar intervertebral disc 05/01/2018  . History of total knee replacement, left 03/21/2018  . Age-related nuclear cataract of right eye 01/01/2018  . Aphakia of eye, left 01/01/2018  . Ocular hypertension of left eye 01/01/2018  . Retained lens material following cataract surgery of left eye 09/22/2017  . OA (osteoarthritis) of knee 01/30/2017  . Right foot pain 11/04/2016  . Amputation, toe, traumatic, right, sequela ([place]) 11/04/2016  . Neuropathy 11/04/2016  . Typical atrial flutter ([place]) 09/12/2016  . Encounter for therapeutic drug monitoring 04/18/2016  . H/O asplenia 06/15/2015  . Chronic systolic congestive heart failure ([place]) 10/20/2014  . Subacute osteomyelitis, right ankle and foot ([place]) 10/19/2014  . Osteomyelitis of fifth toe of right foot ([place]) 10/17/2014  . Septic arthritis of interphalangeal joint of toe of right foot ([place]) 10/17/2014  . Atrial flutter ([place]) 10/17/2014  . History of lymphoma 05/30/2014  . CKD (chronic kidney disease) stage 3, GFR 30-59 ml/min 02/27/2014  . Hyperlipidemia 01/23/2014  . Foot ulcer, right ([place]) 01/23/2014  . Right foot drop 01/23/2014  . CAD in native artery 01/23/2014  . Metabolic syndrome 53/29/9242  . Encounter for screening colonoscopy 12/26/2013  . History of bradycardia 12/26/2013  . HTN (hypertension) 09/12/2012  . Lymphoma ([place]) 09/12/2012  . Diabetic peripheral neuropathy ([place]) 12/05/2011  . Erectile dysfunction 12/05/2011  . H/O autologous stem cell transplant ([place]) 05/30/2011  . Mantle cell lymphoma ([place]) 05/05/2011   Past Medical History:  Diagnosis Date  . Arthritis   . Bell's palsy    left eye droops a little  . CAD (coronary artery disease)   . Cancer [facility]) 2012   Mantle Cell Cancer- S/P stem cell transplant  . CKD (chronic kidney disease) stage 3, GFR 30-59 ml/min    per lov with pcp,  last seen at [facility] january 2020 per patient, stable per patient   . Diabetes mellitus without complication (HCC)      hx of lost weight due to cancer not on medications since 2014   . Dysrhythmia    atrial fib and atrial flutter, no repeat of dysrhymia since ablation in 2018 ,   . Eye injury    left eye hit by a wood chip early june 2020, lov for f/u with opthamologist 6-17 , drained a pustule,  progress notes mention eye gradually improving with application of antibiotic  eye drops   . History of chemotherapy last tx 2014  . History of diabetes mellitus NONE SINCE 2012 CANCER  . History of splenectomy   . Hyperlipemia   . Hypertension   . Neuromuscular disorder ([place])    peripheral neuropathy  .  Neuropathy    right leg due to football injury  . Polysubstance abuse ([place])   . Sinus bradycardia     Family History  Problem Relation Age of Onset  . Hypertension Mother   . Diabetes Mother   . Cancer Father        pancreatic cancer  . Diabetes Father   . Heart disease Father   . Diabetes Sister     Past Surgical History:  Procedure Laterality Date  . A-FLUTTER ABLATION N/A 09/12/2016   Procedure: A-Flutter Ablation;  Surgeon: Will Meredith Leeds, MD;  Location: [facility];  Service: Cardiovascular;  Laterality: N/A;  . AMPUTATION Right 10/20/2014   Procedure: AMPUTATION RIGHT GREAT TOE;  Surgeon: Gaynelle Arabian, MD;  [facility];  Service: Orthopedics;  Laterality: Right;  . AMPUTATION Right 12/25/2014   Procedure: RIGHT 2ND TOE AMPUTATION ;  Surgeon: Wylene Simmer, MD;  Location: [facility];  Service: Orthopedics;  Laterality: Right;  . AMPUTATION Right 12/20/2019   Procedure: RIGHT FOOT AMPUTATION TOES;  Surgeon: Newt Minion, MD;  Location: [facility];  Service: Orthopedics;  Laterality: Right;  . AMPUTATION Right 01/22/2020   Procedure: RIGHT FOOT 5TH RAY AMPUTATION;  Surgeon: Newt Minion, MD;  Location: [facility];  Service: Orthopedics;  Laterality: Right;  . edentulation N/A y-2   prior to transplant  . EYE SURGERY Left    caratct  . REVERSE SHOULDER ARTHROPLASTY Right  01/31/2019   Procedure: REVERSE SHOULDER ARTHROPLASTY;  Surgeon: Justice Britain, MD;  [facility];  Service: Orthopedics;  Laterality: Right;  . right knee surgery Right    nerve graft back of leg  . SPLENECTOMY, TOTAL     mantle cell lymphoma  . TENOLYSIS Right 12/25/2014   Procedure: RIGHT 3,4,5 FLEXOR TENDON RELEASE ;  Surgeon: Wylene Simmer, MD;  Location: [facility];  Service: Orthopedics;  Laterality: Right;  . TOTAL KNEE ARTHROPLASTY Left 01/30/2017   Procedure: LEFT TOTAL KNEE ARTHROPLASTY;  Surgeon: Gaynelle Arabian, MD;  [facility];  Service: Orthopedics;  Laterality: Left;   Social History   Occupational History  . Not on file  Tobacco Use  . Smoking status: Never Smoker  . Smokeless tobacco: Never Used  Vaping Use  . Vaping Use: Never used  Substance and Sexual Activity  . Alcohol use: Yes    Alcohol/week: 6.0 standard drinks    Types: 6 Cans of beer per week    Comment: occasional   . Drug use: No    Comment: last use few years ago per patient - used marijuana ; denies use today 01-28-2019  . Sexual activity: Yes    Partners: Female

## 2020-01-30 ENCOUNTER — Telehealth: Payer: Self-pay

## 2020-01-30 NOTE — Telephone Encounter (Signed)
[name], PT with [facility] called stating that patient's HR-50bpm, pain is 5 out of 10, and patient takes tylenol during the day and is in pain management.  Would like to know patient's WB status and verbal orders for nurse to do evaluation?  CB# [phone].  Please advise.  Thank you.

## 2020-01-31 ENCOUNTER — Telehealth: Payer: Self-pay

## 2020-01-31 NOTE — Telephone Encounter (Signed)
[name], PT with [facility] called back stating that he did receive VM and he is needing verbal orders for [facility] for wound management.  Cb# [phone].  Please advise.  Thank you.

## 2020-01-31 NOTE — Telephone Encounter (Signed)
Please advise on HHRN.

## 2020-01-31 NOTE — Telephone Encounter (Signed)
I left voicemail advising. ?

## 2020-01-31 NOTE — Telephone Encounter (Signed)
I left voicemail for Jim advising. 

## 2020-01-31 NOTE — Telephone Encounter (Signed)
Just needs a daily dry dressing change 4x4s and ace wrap. Can clense foot with dial soap and water. Do not soak

## 2020-01-31 NOTE — Telephone Encounter (Signed)
Please advise 

## 2020-01-31 NOTE — Telephone Encounter (Signed)
Non weightbearing. If medical issues or pain issues should contact pcp

## 2020-02-03 ENCOUNTER — Ambulatory Visit (INDEPENDENT_AMBULATORY_CARE_PROVIDER_SITE_OTHER): Payer: Medicare HMO | Admitting: Orthopedic Surgery

## 2020-02-03 ENCOUNTER — Other Ambulatory Visit: Payer: Self-pay

## 2020-02-03 ENCOUNTER — Encounter: Payer: Self-pay | Admitting: Orthopedic Surgery

## 2020-02-03 VITALS — Ht 72.0 in | Wt 212.0 lb

## 2020-02-03 DIAGNOSIS — M869 Osteomyelitis, unspecified: Secondary | ICD-10-CM

## 2020-02-03 NOTE — Progress Notes (Signed)
Office Visit Note   Patient: Cory Marks           Date of Birth: 1960/09/13           MRN: [id number] Visit Date: 02/03/2020              Requested by: [name], MD [place],  [place] [id number] PCP: [name], MD  Chief Complaint  Patient presents with  . Right Foot - Routine Post Op    01/22/20 right foot 5th ray amputation       HPI: Patient presents in follow-up status post fifth ray amputation right foot patient has been on doxycycline for cultures positive for MRSA.  Assessment & Plan: Visit Diagnoses:  1. Osteomyelitis of fifth toe of right foot ([place])     Plan: Continued protected weight-bearing,   Follow-Up Instructions: Return in about 1 week (around 02/10/2020).   Ortho Exam  Patient is alert, oriented, no adenopathy, well-dressed, normal affect, normal respiratory effort. Examination the wound edges are well approximated there is no redness no cellulitis no drainage no signs of infection.  Imaging: No results found. No images are attached to the encounter.  Labs: Lab Results  Component Value Date   HGBA1C 5.9 (H) 01/28/2019   HGBA1C 5.8 11/16/2017   HGBA1C 5.7 11/03/2016   ESRSEDRATE 8 12/19/2019   CRP 1.2 (H) 12/21/2019   CRP 1.5 (H) 12/20/2019   LABURIC 9.0 (H) 12/08/2010   LABURIC 10.2 (H) 12/02/2010   LABURIC 10.7 (H) 12/01/2010   REPTSTATUS 01/27/2020 FINAL 01/22/2020   GRAMSTAIN  01/22/2020    RARE WBC PRESENT,BOTH PMN AND MONONUCLEAR FEW GRAM POSITIVE COCCI IN PAIRS    CULT  01/22/2020    ABUNDANT METHICILLIN RESISTANT STAPHYLOCOCCUS AUREUS NO ANAEROBES ISOLATED Performed at [facility], [place] [address] [place], [place] [address]    LABORGA METHICILLIN RESISTANT STAPHYLOCOCCUS AUREUS 01/22/2020     Lab Results  Component Value Date   ALBUMIN 3.1 (L) 12/20/2019   ALBUMIN 3.7 12/19/2019   ALBUMIN 4.5 05/18/2018   LABURIC 9.0 (H) 12/08/2010   LABURIC 10.2 (H) 12/02/2010   LABURIC 10.7 (H) 12/01/2010      Lab Results  Component Value Date   MG 1.8 12/21/2019   MG 1.8 07/22/2014   No results found for: VD25OH  No results found for: PREALBUMIN CBC EXTENDED Latest Ref Rng & Units 01/22/2020 12/21/2019 12/20/2019  WBC 4.0 - 10.5 K/uL 8.9 11.3(H) 5.0  RBC 4.22 - 5.81 MIL/uL 4.25 4.37 4.45  HGB 13.0 - 17.0 g/dL 13.6 14.3 14.6  HCT 39 - 52 % 41.3 42.4 43.2  PLT 150 - 400 K/uL 334 308 282  NEUTROABS 1.7 - 7.7 K/uL - - -  LYMPHSABS 0.7 - 4.0 K/uL - - -     Body mass index is 28.75 kg/m.  Orders:  No orders of the defined types were placed in this encounter.  No orders of the defined types were placed in this encounter.    Procedures: No procedures performed  Clinical Data: No additional findings.  ROS:  All other systems negative, except as noted in the HPI. Review of Systems  Objective: Vital Signs: Ht 6' (1.829 m)   Wt 212 lb (96.2 kg)   BMI 28.75 kg/m   Specialty Comments:  No specialty comments available.  PMFS History: Patient Active Problem List   Diagnosis Date Noted  . Acute hematogenous osteomyelitis of right foot ([place]) 01/22/2020  . Cutaneous abscess  of right foot   . S/P reverse total shoulder arthroplasty, right 01/31/2019  . Spinal stenosis of lumbar region 10/10/2018  . Oropharyngeal dysphagia 07/11/2018  . Pneumonia in aspergillosis ([place]) 07/11/2018  . Degeneration of lumbar intervertebral disc 05/01/2018  . History of total knee replacement, left 03/21/2018  . Age-related nuclear cataract of right eye 01/01/2018  . Aphakia of eye, left 01/01/2018  . Ocular hypertension of left eye 01/01/2018  . Retained lens material following cataract surgery of left eye 09/22/2017  . OA (osteoarthritis) of knee 01/30/2017  . Right foot pain 11/04/2016  . Amputation, toe, traumatic, right, sequela ([place]) 11/04/2016  . Neuropathy 11/04/2016  . Typical atrial flutter ([place]) 09/12/2016  . Encounter for therapeutic drug monitoring 04/18/2016  . H/O asplenia  06/15/2015  . Chronic systolic congestive heart failure ([place]) 10/20/2014  . Subacute osteomyelitis, right ankle and foot ([place]) 10/19/2014  . Osteomyelitis of fifth toe of right foot ([place]) 10/17/2014  . Septic arthritis of interphalangeal joint of toe of right foot ([place]) 10/17/2014  . Atrial flutter ([place]) 10/17/2014  . History of lymphoma 05/30/2014  . CKD (chronic kidney disease) stage 3, GFR 30-59 ml/min 02/27/2014  . Hyperlipidemia 01/23/2014  . Foot ulcer, right ([place]) 01/23/2014  . Right foot drop 01/23/2014  . CAD in native artery 01/23/2014  . Metabolic syndrome 65/68/1275  . Encounter for screening colonoscopy 12/26/2013  . History of bradycardia 12/26/2013  . HTN (hypertension) 09/12/2012  . Lymphoma ([place]) 09/12/2012  . Diabetic peripheral neuropathy ([place]) 12/05/2011  . Erectile dysfunction 12/05/2011  . H/O autologous stem cell transplant ([place]) 05/30/2011  . Mantle cell lymphoma ([place]) 05/05/2011   Past Medical History:  Diagnosis Date  . Arthritis   . Bell's palsy    left eye droops a little  . CAD (coronary artery disease)   . Cancer [facility]) 2012   Mantle Cell Cancer- S/P stem cell transplant  . CKD (chronic kidney disease) stage 3, GFR 30-59 ml/min    per lov with pcp,  last seen at [facility] january 2020 per patient, stable per patient   . Diabetes mellitus without complication (HCC)    hx of lost weight due to cancer not on medications since 2014   . Dysrhythmia    atrial fib and atrial flutter, no repeat of dysrhymia since ablation in 2018 ,   . Eye injury    left eye hit by a wood chip early june 2020, lov for f/u with opthamologist 6-17 , drained a pustule,  progress notes mention eye gradually improving with application of antibiotic  eye drops   . History of chemotherapy last tx 2014  . History of diabetes mellitus NONE SINCE 2012 CANCER  . History of splenectomy   . Hyperlipemia   . Hypertension   . Neuromuscular disorder ([place])    peripheral  neuropathy  . Neuropathy    right leg due to football injury  . Polysubstance abuse ([place])   . Sinus bradycardia     Family History  Problem Relation Age of Onset  . Hypertension Mother   . Diabetes Mother   . Cancer Father        pancreatic cancer  . Diabetes Father   . Heart disease Father   . Diabetes Sister     Past Surgical History:  Procedure Laterality Date  . A-FLUTTER ABLATION N/A 09/12/2016   Procedure: A-Flutter Ablation;  Surgeon: Will Meredith Leeds, MD;  Location: [place] [facility];  Service: Cardiovascular;  Laterality: N/A;  .  AMPUTATION Right 10/20/2014   Procedure: AMPUTATION RIGHT GREAT TOE;  Surgeon: Gaynelle Arabian, MD;  [facility];  Service: Orthopedics;  Laterality: Right;  . AMPUTATION Right 12/25/2014   Procedure: RIGHT 2ND TOE AMPUTATION ;  Surgeon: Wylene Simmer, MD;  Location: [facility];  Service: Orthopedics;  Laterality: Right;  . AMPUTATION Right 12/20/2019   Procedure: RIGHT FOOT AMPUTATION TOES;  Surgeon: Newt Minion, MD;  Location: [facility];  Service: Orthopedics;  Laterality: Right;  . AMPUTATION Right 01/22/2020   Procedure: RIGHT FOOT 5TH RAY AMPUTATION;  Surgeon: Newt Minion, MD;  Location: [facility];  Service: Orthopedics;  Laterality: Right;  . edentulation N/A y-2   prior to transplant  . EYE SURGERY Left    caratct  . REVERSE SHOULDER ARTHROPLASTY Right 01/31/2019   Procedure: REVERSE SHOULDER ARTHROPLASTY;  Surgeon: Justice Britain, MD;  [facility];  Service: Orthopedics;  Laterality: Right;  . right knee surgery Right    nerve graft back of leg  . SPLENECTOMY, TOTAL     mantle cell lymphoma  . TENOLYSIS Right 12/25/2014   Procedure: RIGHT 3,4,5 FLEXOR TENDON RELEASE ;  Surgeon: Wylene Simmer, MD;  Location: [facility];  Service: Orthopedics;  Laterality: Right;  . TOTAL KNEE ARTHROPLASTY Left 01/30/2017   Procedure: LEFT TOTAL KNEE ARTHROPLASTY;  Surgeon: Gaynelle Arabian, MD;  [facility];  Service:  Orthopedics;  Laterality: Left;   Social History   Occupational History  . Not on file  Tobacco Use  . Smoking status: Never Smoker  . Smokeless tobacco: Never Used  Vaping Use  . Vaping Use: Never used  Substance and Sexual Activity  . Alcohol use: Yes    Alcohol/week: 6.0 standard drinks    Types: 6 Cans of beer per week    Comment: occasional   . Drug use: No    Comment: last use few years ago per patient - used marijuana ; denies use today 01-28-2019  . Sexual activity: Yes    Partners: Female

## 2020-02-03 NOTE — Progress Notes (Signed)
Office Visit Note   Patient: Cory Marks           Date of Birth: 1960/08/29           MRN: [id number] Visit Date: 02/03/2020              Requested by: [name], MD [place],  [place] [address] PCP: [name], MD  Chief Complaint  Patient presents with  . Right Foot - Routine Post Op    01/22/20 right foot 5th ray amputation       HPI: Patient is a 59 year old gentleman status post right foot fifth ray amputation.  He is currently on doxycycline cultures were positive for MRSA.  Patient has a family history of diabetes.  Assessment & Plan: Visit Diagnoses:  1. Osteomyelitis of fifth toe of right foot (HCC)     Plan: Tylenol so water and dry dressing change daily continue nonweightbearing.  Follow-up in 1 week to harvest the sutures.  Discussed the patient's last hemoglobin A1c was 5.9 he is a borderline diabetic and recommended eliminating carbohydrates from his diet and then have his primary care Dr. check his A1c in 3 months.  Follow-Up Instructions: Return in about 1 week (around 02/10/2020).   Ortho Exam  Patient is alert, oriented, no adenopathy, well-dressed, normal affect, normal respiratory effort. Examination the wound edges are well approximated there is no redness no cellulitis no drainage no signs of infection.  Imaging: No results found. No images are attached to the encounter.  Labs: Lab Results  Component Value Date   HGBA1C 5.9 (H) 01/28/2019   HGBA1C 5.8 11/16/2017   HGBA1C 5.7 11/03/2016   ESRSEDRATE 8 12/19/2019   CRP 1.2 (H) 12/21/2019   CRP 1.5 (H) 12/20/2019   LABURIC 9.0 (H) 12/08/2010   LABURIC 10.2 (H) 12/02/2010   LABURIC 10.7 (H) 12/01/2010   REPTSTATUS 01/27/2020 FINAL 01/22/2020   GRAMSTAIN  01/22/2020    RARE WBC PRESENT,BOTH PMN AND MONONUCLEAR FEW GRAM POSITIVE COCCI IN PAIRS    CULT  01/22/2020    ABUNDANT METHICILLIN RESISTANT STAPHYLOCOCCUS AUREUS NO ANAEROBES ISOLATED Performed at [facility], [place] [address], [place], [place] [address]    LABORGA METHICILLIN RESISTANT STAPHYLOCOCCUS AUREUS 01/22/2020     Lab Results  Component Value Date   ALBUMIN 3.1 (L) 12/20/2019   ALBUMIN 3.7 12/19/2019   ALBUMIN 4.5 05/18/2018   LABURIC 9.0 (H) 12/08/2010   LABURIC 10.2 (H) 12/02/2010   LABURIC 10.7 (H) 12/01/2010    Lab Results  Component Value Date   MG 1.8 12/21/2019   MG 1.8 07/22/2014   No results found for: VD25OH  No results found for: PREALBUMIN CBC EXTENDED Latest Ref Rng & Units 01/22/2020 12/21/2019 12/20/2019  WBC 4.0 - 10.5 K/uL 8.9 11.3(H) 5.0  RBC 4.22 - 5.81 MIL/uL 4.25 4.37 4.45  HGB 13.0 - 17.0 g/dL 13.6 14.3 14.6  HCT 39 - 52 % 41.3 42.4 43.2  PLT 150 - 400 K/uL 334 308 282  NEUTROABS 1.7 - 7.7 K/uL - - -  LYMPHSABS 0.7 - 4.0 K/uL - - -     Body mass index is 28.75 kg/m.  Orders:  No orders of the defined types were placed in this encounter.  No orders of the defined types were placed in this encounter.    Procedures: No procedures performed  Clinical Data: No additional findings.  ROS:  All other systems negative, except as noted in the HPI.  Review of Systems  Objective: Vital Signs: Ht 6' (1.829 m)   Wt 212 lb (96.2 kg)   BMI 28.75 kg/m   Specialty Comments:  No specialty comments available.  PMFS History: Patient Active Problem List   Diagnosis Date Noted  . Acute hematogenous osteomyelitis of right foot ([place]) 01/22/2020  . Cutaneous abscess of right foot   . S/P reverse total shoulder arthroplasty, right 01/31/2019  . Spinal stenosis of lumbar region 10/10/2018  . Oropharyngeal dysphagia 07/11/2018  . Pneumonia in aspergillosis ([place]) 07/11/2018  . Degeneration of lumbar intervertebral disc 05/01/2018  . History of total knee replacement, left 03/21/2018  . Age-related nuclear cataract of right eye 01/01/2018  . Aphakia of eye, left 01/01/2018  . Ocular hypertension of left eye 01/01/2018  . Retained lens  material following cataract surgery of left eye 09/22/2017  . OA (osteoarthritis) of knee 01/30/2017  . Right foot pain 11/04/2016  . Amputation, toe, traumatic, right, sequela ([place]) 11/04/2016  . Neuropathy 11/04/2016  . Typical atrial flutter ([place]) 09/12/2016  . Encounter for therapeutic drug monitoring 04/18/2016  . H/O asplenia 06/15/2015  . Chronic systolic congestive heart failure ([place]) 10/20/2014  . Subacute osteomyelitis, right ankle and foot ([place]) 10/19/2014  . Osteomyelitis of fifth toe of right foot ([place]) 10/17/2014  . Septic arthritis of interphalangeal joint of toe of right foot ([place]) 10/17/2014  . Atrial flutter ([place]) 10/17/2014  . History of lymphoma 05/30/2014  . CKD (chronic kidney disease) stage 3, GFR 30-59 ml/min 02/27/2014  . Hyperlipidemia 01/23/2014  . Foot ulcer, right ([place]) 01/23/2014  . Right foot drop 01/23/2014  . CAD in native artery 01/23/2014  . Metabolic syndrome 37/05/6268  . Encounter for screening colonoscopy 12/26/2013  . History of bradycardia 12/26/2013  . HTN (hypertension) 09/12/2012  . Lymphoma ([place]) 09/12/2012  . Diabetic peripheral neuropathy ([place]) 12/05/2011  . Erectile dysfunction 12/05/2011  . H/O autologous stem cell transplant ([place]) 05/30/2011  . Mantle cell lymphoma ([place]) 05/05/2011   Past Medical History:  Diagnosis Date  . Arthritis   . Bell's palsy    left eye droops a little  . CAD (coronary artery disease)   . Cancer [facility]) 2012   Mantle Cell Cancer- S/P stem cell transplant  . CKD (chronic kidney disease) stage 3, GFR 30-59 ml/min    per lov with pcp,  last seen at [facility] january 2020 per patient, stable per patient   . Diabetes mellitus without complication (HCC)    hx of lost weight due to cancer not on medications since 2014   . Dysrhythmia    atrial fib and atrial flutter, no repeat of dysrhymia since ablation in 2018 ,   . Eye injury    left eye hit by a wood chip early june 2020, lov for f/u with  opthamologist 6-17 , drained a pustule,  progress notes mention eye gradually improving with application of antibiotic  eye drops   . History of chemotherapy last tx 2014  . History of diabetes mellitus NONE SINCE 2012 CANCER  . History of splenectomy   . Hyperlipemia   . Hypertension   . Neuromuscular disorder ([place])    peripheral neuropathy  . Neuropathy    right leg due to football injury  . Polysubstance abuse ([place])   . Sinus bradycardia     Family History  Problem Relation Age of Onset  . Hypertension Mother   . Diabetes Mother   . Cancer Father  pancreatic cancer  . Diabetes Father   . Heart disease Father   . Diabetes Sister     Past Surgical History:  Procedure Laterality Date  . A-FLUTTER ABLATION N/A 09/12/2016   Procedure: A-Flutter Ablation;  Surgeon: Will Meredith Leeds, MD;  Location: [facility];  Service: Cardiovascular;  Laterality: N/A;  . AMPUTATION Right 10/20/2014   Procedure: AMPUTATION RIGHT GREAT TOE;  Surgeon: Gaynelle Arabian, MD;  [facility];  Service: Orthopedics;  Laterality: Right;  . AMPUTATION Right 12/25/2014   Procedure: RIGHT 2ND TOE AMPUTATION ;  Surgeon: Wylene Simmer, MD;  Location: [facility];  Service: Orthopedics;  Laterality: Right;  . AMPUTATION Right 12/20/2019   Procedure: RIGHT FOOT AMPUTATION TOES;  Surgeon: Newt Minion, MD;  Location: [facility];  Service: Orthopedics;  Laterality: Right;  . AMPUTATION Right 01/22/2020   Procedure: RIGHT FOOT 5TH RAY AMPUTATION;  Surgeon: Newt Minion, MD;  Location: [facility];  Service: Orthopedics;  Laterality: Right;  . edentulation N/A y-2   prior to transplant  . EYE SURGERY Left    caratct  . REVERSE SHOULDER ARTHROPLASTY Right 01/31/2019   Procedure: REVERSE SHOULDER ARTHROPLASTY;  Surgeon: Justice Britain, MD;  [facility];  Service: Orthopedics;  Laterality: Right;  . right knee surgery Right    nerve graft back of leg  . SPLENECTOMY, TOTAL     mantle cell  lymphoma  . TENOLYSIS Right 12/25/2014   Procedure: RIGHT 3,4,5 FLEXOR TENDON RELEASE ;  Surgeon: Wylene Simmer, MD;  Location: [facility];  Service: Orthopedics;  Laterality: Right;  . TOTAL KNEE ARTHROPLASTY Left 01/30/2017   Procedure: LEFT TOTAL KNEE ARTHROPLASTY;  Surgeon: Gaynelle Arabian, MD;  [facility];  Service: Orthopedics;  Laterality: Left;   Social History   Occupational History  . Not on file  Tobacco Use  . Smoking status: Never Smoker  . Smokeless tobacco: Never Used  Vaping Use  . Vaping Use: Never used  Substance and Sexual Activity  . Alcohol use: Yes    Alcohol/week: 6.0 standard drinks    Types: 6 Cans of beer per week    Comment: occasional   . Drug use: No    Comment: last use few years ago per patient - used marijuana ; denies use today 01-28-2019  . Sexual activity: Yes    Partners: Female

## 2020-02-05 ENCOUNTER — Telehealth: Payer: Self-pay | Admitting: Family

## 2020-02-05 NOTE — Telephone Encounter (Signed)
Heather from [facility] called.   She is requesting verbal orders to continue home care 1wk1 2wk2 1w3  Call back: [phone]

## 2020-02-05 NOTE — Telephone Encounter (Signed)
Called and gave verbal ok for orders as request below. Will call with any other questions.

## 2020-02-12 ENCOUNTER — Ambulatory Visit: Payer: Medicare HMO | Admitting: Podiatry

## 2020-02-12 ENCOUNTER — Ambulatory Visit: Payer: Medicare HMO | Admitting: Family

## 2020-02-14 ENCOUNTER — Ambulatory Visit (INDEPENDENT_AMBULATORY_CARE_PROVIDER_SITE_OTHER): Payer: Medicare HMO | Admitting: Family

## 2020-02-14 ENCOUNTER — Encounter: Payer: Self-pay | Admitting: Family

## 2020-02-14 ENCOUNTER — Other Ambulatory Visit: Payer: Self-pay

## 2020-02-14 VITALS — Ht 72.0 in | Wt 212.0 lb

## 2020-02-14 DIAGNOSIS — M869 Osteomyelitis, unspecified: Secondary | ICD-10-CM

## 2020-02-14 NOTE — Progress Notes (Signed)
Post-Op Visit Note   Patient: Cory Marks           Date of Birth: [birth year]           MRN: [id number] Visit Date: 02/14/2020 PCP: [name], MD  Chief Complaint:  Chief Complaint  Patient presents with  . Right Foot - Routine Post Op    01/22/20 right foot 5th ray amputation       HPI:  HPI The patient is a 59 year old Seen today status post fifth ray amputation on the right foot June 16. Sutures remain in place there is 1 area of dehiscence he is been having no drainage. No concerns voiced. Ortho Exam Incision is well-healed distally. Proximally there is an area about 4 cm in length but unfortunately gaped open about 3 mm this has 1 mm of depth is filled in with flat pink tissue. There is no active drainage no surrounding erythema or maceration  Visit Diagnoses:  1. Osteomyelitis of fifth toe of right foot ([place])     Plan: He will continue minimizing his weightbearing. Continue daily Dial soap cleansing and dry dressing changes. Follow-up in 2 weeks harvest sutures at that time  Follow-Up Instructions: Return in about 2 weeks (around 02/28/2020).   Imaging: No results found.  Orders:  No orders of the defined types were placed in this encounter.  No orders of the defined types were placed in this encounter.    PMFS History: Patient Active Problem List   Diagnosis Date Noted  . Acute hematogenous osteomyelitis of right foot ([place]) 01/22/2020  . Cutaneous abscess of right foot   . S/P reverse total shoulder arthroplasty, right 01/31/2019  . Spinal stenosis of lumbar region 10/10/2018  . Oropharyngeal dysphagia 07/11/2018  . Pneumonia in aspergillosis ([place]) 07/11/2018  . Degeneration of lumbar intervertebral disc 05/01/2018  . History of total knee replacement, left 03/21/2018  . Age-related nuclear cataract of right eye 01/01/2018  . Aphakia of eye, left 01/01/2018  . Ocular hypertension of left eye 01/01/2018  . Retained lens material following  cataract surgery of left eye 09/22/2017  . OA (osteoarthritis) of knee 01/30/2017  . Right foot pain 11/04/2016  . Amputation, toe, traumatic, right, sequela ([place]) 11/04/2016  . Neuropathy 11/04/2016  . Typical atrial flutter ([place]) 09/12/2016  . Encounter for therapeutic drug monitoring 04/18/2016  . H/O asplenia 06/15/2015  . Chronic systolic congestive heart failure ([place]) 10/20/2014  . Subacute osteomyelitis, right ankle and foot ([place]) 10/19/2014  . Osteomyelitis of fifth toe of right foot ([place]) 10/17/2014  . Septic arthritis of interphalangeal joint of toe of right foot ([place]) 10/17/2014  . Atrial flutter ([place]) 10/17/2014  . History of lymphoma 05/30/2014  . CKD (chronic kidney disease) stage 3, GFR 30-59 ml/min 02/27/2014  . Hyperlipidemia 01/23/2014  . Foot ulcer, right ([place]) 01/23/2014  . Right foot drop 01/23/2014  . CAD in native artery 01/23/2014  . Metabolic syndrome 08/16/3233  . Encounter for screening colonoscopy 12/26/2013  . History of bradycardia 12/26/2013  . HTN (hypertension) 09/12/2012  . Lymphoma ([place]) 09/12/2012  . Diabetic peripheral neuropathy ([place]) 12/05/2011  . Erectile dysfunction 12/05/2011  . H/O autologous stem cell transplant ([place]) 05/30/2011  . Mantle cell lymphoma ([place]) 05/05/2011   Past Medical History:  Diagnosis Date  . Arthritis   . Bell's palsy    left eye droops a little  . CAD (coronary artery disease)   . Cancer [facility]) 2012   Mantle Cell Cancer- S/P stem cell  transplant  . CKD (chronic kidney disease) stage 3, GFR 30-59 ml/min    per lov with pcp,  last seen at [facility] january 2020 per patient, stable per patient   . Diabetes mellitus without complication (HCC)    hx of lost weight due to cancer not on medications since 2014   . Dysrhythmia    atrial fib and atrial flutter, no repeat of dysrhymia since ablation in 2018 ,   . Eye injury    left eye hit by a wood chip early june 2020, lov for f/u with opthamologist 6-17 ,  drained a pustule,  progress notes mention eye gradually improving with application of antibiotic  eye drops   . History of chemotherapy last tx 2014  . History of diabetes mellitus NONE SINCE 2012 CANCER  . History of splenectomy   . Hyperlipemia   . Hypertension   . Neuromuscular disorder ([place])    peripheral neuropathy  . Neuropathy    right leg due to football injury  . Polysubstance abuse ([place])   . Sinus bradycardia     Family History  Problem Relation Age of Onset  . Hypertension Mother   . Diabetes Mother   . Cancer Father        pancreatic cancer  . Diabetes Father   . Heart disease Father   . Diabetes Sister     Past Surgical History:  Procedure Laterality Date  . A-FLUTTER ABLATION N/A 09/12/2016   Procedure: A-Flutter Ablation;  Surgeon: Will Meredith Leeds, MD;  Location: [facility];  Service: Cardiovascular;  Laterality: N/A;  . AMPUTATION Right 10/20/2014   Procedure: AMPUTATION RIGHT GREAT TOE;  Surgeon: Gaynelle Arabian, MD;  [facility];  Service: Orthopedics;  Laterality: Right;  . AMPUTATION Right 12/25/2014   Procedure: RIGHT 2ND TOE AMPUTATION ;  Surgeon: Wylene Simmer, MD;  Location: [place] [facility];  Service: Orthopedics;  Laterality: Right;  . AMPUTATION Right 12/20/2019   Procedure: RIGHT FOOT AMPUTATION TOES;  Surgeon: Newt Minion, MD;  Location: [facility];  Service: Orthopedics;  Laterality: Right;  . AMPUTATION Right 01/22/2020   Procedure: RIGHT FOOT 5TH RAY AMPUTATION;  Surgeon: Newt Minion, MD;  Location: [facility];  Service: Orthopedics;  Laterality: Right;  . edentulation N/A y-2   prior to transplant  . EYE SURGERY Left    caratct  . REVERSE SHOULDER ARTHROPLASTY Right 01/31/2019   Procedure: REVERSE SHOULDER ARTHROPLASTY;  Surgeon: Justice Britain, MD;  [facility];  Service: Orthopedics;  Laterality: Right;  . right knee surgery Right    nerve graft back of leg  . SPLENECTOMY, TOTAL     mantle cell lymphoma  . TENOLYSIS Right  12/25/2014   Procedure: RIGHT 3,4,5 FLEXOR TENDON RELEASE ;  Surgeon: Wylene Simmer, MD;  Location: [place];  Service: Orthopedics;  Laterality: Right;  . TOTAL KNEE ARTHROPLASTY Left 01/30/2017   Procedure: LEFT TOTAL KNEE ARTHROPLASTY;  Surgeon: Gaynelle Arabian, MD;  [facility];  Service: Orthopedics;  Laterality: Left;   Social History   Occupational History  . Not on file  Tobacco Use  . Smoking status: Never Smoker  . Smokeless tobacco: Never Used  Vaping Use  . Vaping Use: Never used  Substance and Sexual Activity  . Alcohol use: Yes    Alcohol/week: 6.0 standard drinks    Types: 6 Cans of beer per week    Comment: occasional   . Drug use: No    Comment:  last use few years ago per patient - used marijuana ; denies use today 01-28-2019  . Sexual activity: Yes    Partners: Female

## 2020-02-17 ENCOUNTER — Other Ambulatory Visit: Payer: Self-pay | Admitting: Physician Assistant

## 2020-02-17 ENCOUNTER — Telehealth: Payer: Self-pay | Admitting: Orthopedic Surgery

## 2020-02-17 MED ORDER — DOXYCYCLINE HYCLATE 100 MG PO TABS: 100.0000 mg | ORAL_TABLET | Freq: Two times a day (BID) | ORAL | 0 refills | Status: DC

## 2020-02-17 NOTE — Telephone Encounter (Signed)
Patient called requesting a refill on doxycycline. Patient asked if he still needs to take this medication. Please send to pharmacy on file and call patient to notify it's been call in. Patient phone number is [phone].

## 2020-02-17 NOTE — Telephone Encounter (Signed)
Please advise, patient has previously been prescribed medication for MRSA. Does he need a refill?

## 2020-02-17 NOTE — Telephone Encounter (Signed)
Patient called and lvm.

## 2020-02-17 NOTE — Telephone Encounter (Signed)
Doxy refilled

## 2020-02-19 ENCOUNTER — Other Ambulatory Visit: Payer: Self-pay

## 2020-02-19 ENCOUNTER — Ambulatory Visit (INDEPENDENT_AMBULATORY_CARE_PROVIDER_SITE_OTHER): Payer: Medicare HMO | Admitting: Podiatry

## 2020-02-19 DIAGNOSIS — E1149 Type 2 diabetes mellitus with other diabetic neurological complication: Secondary | ICD-10-CM

## 2020-02-19 DIAGNOSIS — M79675 Pain in left toe(s): Secondary | ICD-10-CM

## 2020-02-19 DIAGNOSIS — B351 Tinea unguium: Secondary | ICD-10-CM | POA: Diagnosis not present

## 2020-02-20 ENCOUNTER — Encounter: Payer: Self-pay | Admitting: Podiatry

## 2020-02-20 NOTE — Progress Notes (Signed)
°  Subjective:  Patient ID: Cory Marks, male    DOB: Apr 10, 1961,  MRN: [id number]  Chief Complaint  Patient presents with   Nail Problem    Nail trim 1-5 left foot.   Wound Check    Right foot wound check. Denies fever/chills/nausea/vomiting and states he has no concerns.   59 y.o. male returns for the above complaint.  Patient presents with thickened elongated dystrophic toenails to the left side 1 through 5.  Patient has a TMA on the right side with wound dehiscence that is done by Dr. Sharol Given.  This is also being primarily managed by him therefore I would not evaluated.  He is here today primarily to have his toenail debrided down he denies any other acute complaints.  He states is mildly painful to touch.  Objective:  There were no vitals filed for this visit. Podiatric Exam: Vascular: dorsalis pedis and posterior tibial pulses are palpable bilateral. Capillary return is immediate. Temperature gradient is WNL. Skin turgor WNL  Sensorium: Normal Semmes Weinstein monofilament test. Normal tactile sensation bilaterally. Nail Exam: Pt has thick disfigured discolored nails with subungual debris noted left entire nail hallux through fifth toenails.  Pain on palpation to the nails. Ulcer Exam: There is no evidence of ulcer or pre-ulcerative changes or infection. Orthopedic Exam: Muscle tone and strength are WNL. No limitations in general ROM. No crepitus or effusions noted.  Right TMA. Skin: No Porokeratosis. No infection or ulcers    Assessment & Plan:   1. Pain due to onychomycosis of toenails of both feet   2. Type II diabetes mellitus with neurological manifestations [facility])     Patient was evaluated and treated and all questions answered.  Right foot diabetic infection status post TMA with wound dehiscence -This is primarily being managed by Dr. Sharol Given.  At this point I will defer further care for the right side to him  Onychomycosis with pain  -Nails palliatively debrided as  below. -Educated on self-care  Procedure: Nail Debridement to the left side Rationale: pain  Type of Debridement: manual, sharp debridement. Instrumentation: Nail nipper, rotary burr. Number of Nails: 5  Procedures and Treatment: Consent by patient was obtained for treatment procedures. The patient understood the discussion of treatment and procedures well. All questions were answered thoroughly reviewed. Debridement of mycotic and hypertrophic toenails, 1 through 5 bilateral and clearing of subungual debris. No ulceration, no infection noted.  Return Visit-Office Procedure: Patient instructed to return to the office for a follow up visit 3 months for continued evaluation and treatment.  Boneta Lucks, DPM    No follow-ups on file.

## 2020-02-28 ENCOUNTER — Encounter: Payer: Self-pay | Admitting: Family

## 2020-02-28 ENCOUNTER — Other Ambulatory Visit: Payer: Self-pay

## 2020-02-28 ENCOUNTER — Ambulatory Visit (INDEPENDENT_AMBULATORY_CARE_PROVIDER_SITE_OTHER): Payer: Medicare HMO | Admitting: Family

## 2020-02-28 VITALS — Ht 72.0 in | Wt 212.0 lb

## 2020-02-28 DIAGNOSIS — M869 Osteomyelitis, unspecified: Secondary | ICD-10-CM

## 2020-02-28 DIAGNOSIS — Z89431 Acquired absence of right foot: Secondary | ICD-10-CM

## 2020-02-28 NOTE — Progress Notes (Signed)
Post-Op Visit Note   Patient: Cory Marks           Date of Birth: 1961-05-13           MRN: [id number] Visit Date: 02/28/2020 PCP: [name], MD  Chief Complaint:  Chief Complaint  Patient presents with  . Right Foot - Routine Post Op    01/22/20 right foot 5th ray amputation     HPI:  HPI The patient is a 59 year old gentleman seen today status post 5th ray amputation. Has been nonweight bearing in wheel chair. Doing dry dressing changes.   Ortho Exam Incision is nearly healed. Superficial granulation in bed. Few remaining sutures harvested today. No surrounding erythema, no drainage. No sign of infection.   Visit Diagnoses:  1. Osteomyelitis of fifth toe of right foot (HCC)   2. Partial nontraumatic amputation of foot, right ([place])     Plan: sutures harvested without incident. Follow up in 3 weeks. May advance weight bearing as tolerated. Discussed custom orthotic and extra depth shoes.  Follow-Up Instructions: Return in about 3 weeks (around 03/20/2020).   Imaging: No results found.  Orders:  No orders of the defined types were placed in this encounter.  No orders of the defined types were placed in this encounter.    PMFS History: Patient Active Problem List   Diagnosis Date Noted  . Acute hematogenous osteomyelitis of right foot ([place]) 01/22/2020  . Cutaneous abscess of right foot   . S/P reverse total shoulder arthroplasty, right 01/31/2019  . Spinal stenosis of lumbar region 10/10/2018  . Oropharyngeal dysphagia 07/11/2018  . Pneumonia in aspergillosis ([place]) 07/11/2018  . Degeneration of lumbar intervertebral disc 05/01/2018  . History of total knee replacement, left 03/21/2018  . Age-related nuclear cataract of right eye 01/01/2018  . Aphakia of eye, left 01/01/2018  . Ocular hypertension of left eye 01/01/2018  . Retained lens material following cataract surgery of left eye 09/22/2017  . OA (osteoarthritis) of knee 01/30/2017  . Right foot pain  11/04/2016  . Amputation, toe, traumatic, right, sequela ([place]) 11/04/2016  . Neuropathy 11/04/2016  . Typical atrial flutter ([place]) 09/12/2016  . Encounter for therapeutic drug monitoring 04/18/2016  . H/O asplenia 06/15/2015  . Chronic systolic congestive heart failure ([place]) 10/20/2014  . Subacute osteomyelitis, right ankle and foot ([place]) 10/19/2014  . Osteomyelitis of fifth toe of right foot ([place]) 10/17/2014  . Septic arthritis of interphalangeal joint of toe of right foot ([place]) 10/17/2014  . Atrial flutter ([place]) 10/17/2014  . History of lymphoma 05/30/2014  . CKD (chronic kidney disease) stage 3, GFR 30-59 ml/min 02/27/2014  . Hyperlipidemia 01/23/2014  . Foot ulcer, right ([place]) 01/23/2014  . Right foot drop 01/23/2014  . CAD in native artery 01/23/2014  . Metabolic syndrome 41/66/0630  . Encounter for screening colonoscopy 12/26/2013  . History of bradycardia 12/26/2013  . HTN (hypertension) 09/12/2012  . Lymphoma ([place]) 09/12/2012  . Diabetic peripheral neuropathy ([place]) 12/05/2011  . Erectile dysfunction 12/05/2011  . H/O autologous stem cell transplant ([place]) 05/30/2011  . Mantle cell lymphoma ([place]) 05/05/2011   Past Medical History:  Diagnosis Date  . Arthritis   . Bell's palsy    left eye droops a little  . CAD (coronary artery disease)   . Cancer [facility]) 2012   Mantle Cell Cancer- S/P stem cell transplant  . CKD (chronic kidney disease) stage 3, GFR 30-59 ml/min    per lov with pcp,  last seen at [place]  [facility] january 2020 per patient, stable per patient   . Diabetes mellitus without complication (HCC)    hx of lost weight due to cancer not on medications since 2014   . Dysrhythmia    atrial fib and atrial flutter, no repeat of dysrhymia since ablation in 2018 ,   . Eye injury    left eye hit by a wood chip early june 2020, lov for f/u with opthamologist 6-17 , drained a pustule,  progress notes mention eye gradually improving with application of antibiotic  eye drops    . History of chemotherapy last tx 2014  . History of diabetes mellitus NONE SINCE 2012 CANCER  . History of splenectomy   . Hyperlipemia   . Hypertension   . Neuromuscular disorder ([place])    peripheral neuropathy  . Neuropathy    right leg due to football injury  . Polysubstance abuse ([place])   . Sinus bradycardia     Family History  Problem Relation Age of Onset  . Hypertension Mother   . Diabetes Mother   . Cancer Father        pancreatic cancer  . Diabetes Father   . Heart disease Father   . Diabetes Sister     Past Surgical History:  Procedure Laterality Date  . A-FLUTTER ABLATION N/A 09/12/2016   Procedure: A-Flutter Ablation;  Surgeon: Will Meredith Leeds, MD;  Location: [facility];  Service: Cardiovascular;  Laterality: N/A;  . AMPUTATION Right 10/20/2014   Procedure: AMPUTATION RIGHT GREAT TOE;  Surgeon: Gaynelle Arabian, MD;  [facility];  Service: Orthopedics;  Laterality: Right;  . AMPUTATION Right 12/25/2014   Procedure: RIGHT 2ND TOE AMPUTATION ;  Surgeon: Wylene Simmer, MD;  Location: [facility];  Service: Orthopedics;  Laterality: Right;  . AMPUTATION Right 12/20/2019   Procedure: RIGHT FOOT AMPUTATION TOES;  Surgeon: Newt Minion, MD;  Location: [place];  Service: Orthopedics;  Laterality: Right;  . AMPUTATION Right 01/22/2020   Procedure: RIGHT FOOT 5TH RAY AMPUTATION;  Surgeon: Newt Minion, MD;  Location: [place];  Service: Orthopedics;  Laterality: Right;  . edentulation N/A y-2   prior to transplant  . EYE SURGERY Left    caratct  . REVERSE SHOULDER ARTHROPLASTY Right 01/31/2019   Procedure: REVERSE SHOULDER ARTHROPLASTY;  Surgeon: Justice Britain, MD;  [facility];  Service: Orthopedics;  Laterality: Right;  . right knee surgery Right    nerve graft back of leg  . SPLENECTOMY, TOTAL     mantle cell lymphoma  . TENOLYSIS Right 12/25/2014   Procedure: RIGHT 3,4,5 FLEXOR TENDON RELEASE ;  Surgeon: Wylene Simmer, MD;  Location: [place];  Service: Orthopedics;  Laterality: Right;  . TOTAL KNEE ARTHROPLASTY Left 01/30/2017   Procedure: LEFT TOTAL KNEE ARTHROPLASTY;  Surgeon: Gaynelle Arabian, MD;  [facility];  Service: Orthopedics;  Laterality: Left;   Social History   Occupational History  . Not on file  Tobacco Use  . Smoking status: Never Smoker  . Smokeless tobacco: Never Used  Vaping Use  . Vaping Use: Never used  Substance and Sexual Activity  . Alcohol use: Yes    Alcohol/week: 6.0 standard drinks    Types: 6 Cans of beer per week    Comment: occasional   . Drug use: No    Comment: last use few years ago per patient - used marijuana ; denies use today 01-28-2019  . Sexual activity: Yes    Partners:  Female

## 2020-03-13 ENCOUNTER — Encounter: Payer: Self-pay | Admitting: Family

## 2020-03-13 ENCOUNTER — Telehealth: Payer: Self-pay | Admitting: Podiatry

## 2020-03-13 ENCOUNTER — Ambulatory Visit (INDEPENDENT_AMBULATORY_CARE_PROVIDER_SITE_OTHER): Payer: Medicare HMO | Admitting: Family

## 2020-03-13 DIAGNOSIS — Z89431 Acquired absence of right foot: Secondary | ICD-10-CM

## 2020-03-13 NOTE — Telephone Encounter (Signed)
Received a referral for pt to get diabetic shoes and inserts.   Upon checking pt received them already this year. I spoke to pt and he stated he was missing 2 toes previously but is now missing all 5 could he get a new insert for that foot. I told pt I would check with [name] and let him know Monday. But I know the insurance would not cover another pair of shoes and the regular inserts.Marland KitchenMarland Kitchen

## 2020-03-13 NOTE — Progress Notes (Signed)
Post-Op Visit Note   Patient: Cory Marks           Date of Birth: 06-06-1961           MRN: [id number] Visit Date: 03/13/2020 PCP: [name], MD  Chief Complaint:  Chief Complaint  Patient presents with  . Right Foot - Follow-up    HPI:  HPI The patient is a 59 year old gentleman seen today status post 5th ray amputation. Has been full weight bearing in V[place] stockings in regular shoe wear.    Ortho Exam Incision is fully healed. Nonviable tissue debrided. No underlying open area. No drainage. no surrounding erythema. No sign of infection.   Visit Diagnoses:  No diagnosis found.  Plan: follow up as needed. May advance weight bearing as tolerated. Discussed custom orthotic and extra depth shoes. Would like to see rick at [facility]. Discussed double upright brace for gait issues. Would like to hold off.  Follow-Up Instructions: No follow-ups on file.   Imaging: No results found.  Orders:  No orders of the defined types were placed in this encounter.  No orders of the defined types were placed in this encounter.    PMFS History: Patient Active Problem List   Diagnosis Date Noted  . Acute hematogenous osteomyelitis of right foot ([place]) 01/22/2020  . Cutaneous abscess of right foot   . S/P reverse total shoulder arthroplasty, right 01/31/2019  . Spinal stenosis of lumbar region 10/10/2018  . Oropharyngeal dysphagia 07/11/2018  . Pneumonia in aspergillosis ([place]) 07/11/2018  . Degeneration of lumbar intervertebral disc 05/01/2018  . History of total knee replacement, left 03/21/2018  . Age-related nuclear cataract of right eye 01/01/2018  . Aphakia of eye, left 01/01/2018  . Ocular hypertension of left eye 01/01/2018  . Retained lens material following cataract surgery of left eye 09/22/2017  . OA (osteoarthritis) of knee 01/30/2017  . Right foot pain 11/04/2016  . Amputation, toe, traumatic, right, sequela ([place]) 11/04/2016  . Neuropathy 11/04/2016  .  Typical atrial flutter ([place]) 09/12/2016  . Encounter for therapeutic drug monitoring 04/18/2016  . H/O asplenia 06/15/2015  . Chronic systolic congestive heart failure ([place]) 10/20/2014  . Subacute osteomyelitis, right ankle and foot ([place]) 10/19/2014  . Osteomyelitis of fifth toe of right foot ([place]) 10/17/2014  . Septic arthritis of interphalangeal joint of toe of right foot ([place]) 10/17/2014  . Atrial flutter ([place]) 10/17/2014  . History of lymphoma 05/30/2014  . CKD (chronic kidney disease) stage 3, GFR 30-59 ml/min 02/27/2014  . Hyperlipidemia 01/23/2014  . Foot ulcer, right ([place]) 01/23/2014  . Right foot drop 01/23/2014  . CAD in native artery 01/23/2014  . Metabolic syndrome 35/00/9381  . Encounter for screening colonoscopy 12/26/2013  . History of bradycardia 12/26/2013  . HTN (hypertension) 09/12/2012  . Lymphoma ([place]) 09/12/2012  . Diabetic peripheral neuropathy ([place]) 12/05/2011  . Erectile dysfunction 12/05/2011  . H/O autologous stem cell transplant ([place]) 05/30/2011  . Mantle cell lymphoma ([place]) 05/05/2011   Past Medical History:  Diagnosis Date  . Arthritis   . Bell's palsy    left eye droops a little  . CAD (coronary artery disease)   . Cancer [facility]) 2012   Mantle Cell Cancer- S/P stem cell transplant  . CKD (chronic kidney disease) stage 3, GFR 30-59 ml/min    per lov with pcp,  last seen at [place] [facility] january 2020 per patient, stable per patient   . Diabetes mellitus without complication ([place])  hx of lost weight due to cancer not on medications since 2014   . Dysrhythmia    atrial fib and atrial flutter, no repeat of dysrhymia since ablation in 2018 ,   . Eye injury    left eye hit by a wood chip early june 2020, lov for f/u with opthamologist 6-17 , drained a pustule,  progress notes mention eye gradually improving with application of antibiotic  eye drops   . History of chemotherapy last tx 2014  . History of diabetes mellitus NONE SINCE 2012 CANCER  .  History of splenectomy   . Hyperlipemia   . Hypertension   . Neuromuscular disorder ([place])    peripheral neuropathy  . Neuropathy    right leg due to football injury  . Polysubstance abuse ([place])   . Sinus bradycardia     Family History  Problem Relation Age of Onset  . Hypertension Mother   . Diabetes Mother   . Cancer Father        pancreatic cancer  . Diabetes Father   . Heart disease Father   . Diabetes Sister     Past Surgical History:  Procedure Laterality Date  . A-FLUTTER ABLATION N/A 09/12/2016   Procedure: A-Flutter Ablation;  Surgeon: Will Meredith Leeds, MD;  Location: [facility];  Service: Cardiovascular;  Laterality: N/A;  . AMPUTATION Right 10/20/2014   Procedure: AMPUTATION RIGHT GREAT TOE;  Surgeon: Gaynelle Arabian, MD;  [facility];  Service: Orthopedics;  Laterality: Right;  . AMPUTATION Right 12/25/2014   Procedure: RIGHT 2ND TOE AMPUTATION ;  Surgeon: Wylene Simmer, MD;  Location: [place];  Service: Orthopedics;  Laterality: Right;  . AMPUTATION Right 12/20/2019   Procedure: RIGHT FOOT AMPUTATION TOES;  Surgeon: Newt Minion, MD;  Location: [place];  Service: Orthopedics;  Laterality: Right;  . AMPUTATION Right 01/22/2020   Procedure: RIGHT FOOT 5TH RAY AMPUTATION;  Surgeon: Newt Minion, MD;  Location: [place];  Service: Orthopedics;  Laterality: Right;  . edentulation N/A y-2   prior to transplant  . EYE SURGERY Left    caratct  . REVERSE SHOULDER ARTHROPLASTY Right 01/31/2019   Procedure: REVERSE SHOULDER ARTHROPLASTY;  Surgeon: Justice Britain, MD;  [facility];  Service: Orthopedics;  Laterality: Right;  . right knee surgery Right    nerve graft back of leg  . SPLENECTOMY, TOTAL     mantle cell lymphoma  . TENOLYSIS Right 12/25/2014   Procedure: RIGHT 3,4,5 FLEXOR TENDON RELEASE ;  Surgeon: Wylene Simmer, MD;  Location: [place];  Service: Orthopedics;  Laterality: Right;  . TOTAL KNEE ARTHROPLASTY Left 01/30/2017    Procedure: LEFT TOTAL KNEE ARTHROPLASTY;  Surgeon: Gaynelle Arabian, MD;  [facility];  Service: Orthopedics;  Laterality: Left;   Social History   Occupational History  . Not on file  Tobacco Use  . Smoking status: Never Smoker  . Smokeless tobacco: Never Used  Vaping Use  . Vaping Use: Never used  Substance and Sexual Activity  . Alcohol use: Yes    Alcohol/week: 6.0 standard drinks    Types: 6 Cans of beer per week    Comment: occasional   . Drug use: No    Comment: last use few years ago per patient - used marijuana ; denies use today 01-28-2019  . Sexual activity: Yes    Partners: Female

## 2020-03-16 NOTE — Telephone Encounter (Signed)
Per [employer] will not cover another partial foot insert as per medicare only covered 1 per yr . So pt would qualify as of 1.1.2022. The cpt code is the same if missing 1 toe verses transmet. Amputation and that is the reason.

## 2020-04-27 ENCOUNTER — Telehealth: Payer: Self-pay | Admitting: Podiatry

## 2020-04-27 NOTE — Telephone Encounter (Signed)
Pt called asking to talk to [facility] about a toefiller insert.

## 2020-04-28 NOTE — Telephone Encounter (Signed)
Left message to call me back re toe filler

## 2020-04-29 ENCOUNTER — Telehealth: Payer: Self-pay | Admitting: Podiatry

## 2020-04-29 NOTE — Telephone Encounter (Signed)
Pt left message and would like Rick to call him back please.

## 2020-05-20 ENCOUNTER — Telehealth: Payer: Self-pay | Admitting: *Deleted

## 2020-05-20 ENCOUNTER — Other Ambulatory Visit: Payer: Self-pay

## 2020-05-20 ENCOUNTER — Ambulatory Visit (INDEPENDENT_AMBULATORY_CARE_PROVIDER_SITE_OTHER): Payer: Medicare HMO | Admitting: Podiatry

## 2020-05-20 ENCOUNTER — Ambulatory Visit: Payer: Medicare HMO | Admitting: Podiatry

## 2020-05-20 DIAGNOSIS — E1149 Type 2 diabetes mellitus with other diabetic neurological complication: Secondary | ICD-10-CM | POA: Diagnosis not present

## 2020-05-20 DIAGNOSIS — M792 Neuralgia and neuritis, unspecified: Secondary | ICD-10-CM

## 2020-05-20 DIAGNOSIS — B351 Tinea unguium: Secondary | ICD-10-CM

## 2020-05-20 DIAGNOSIS — M79675 Pain in left toe(s): Secondary | ICD-10-CM

## 2020-05-20 NOTE — Telephone Encounter (Signed)
Patient missed his appointment and would like to reschedule(recently released from [facility] today) today if possible,please call at [phone].

## 2020-05-21 ENCOUNTER — Encounter: Payer: Self-pay | Admitting: Podiatry

## 2020-05-21 NOTE — Progress Notes (Signed)
  Subjective:  Patient ID: Cory Marks, male    DOB: September 23, 1960,  MRN: [id number]  Chief Complaint  Patient presents with  . routine foot care    nail trim    59 y.o. male returns for the above complaint.  Patient presents with thickened elongated dystrophic toenails to the left side 1 through 5.  He would like to have them debrided down as he is not able to do it himself.  The TMA has healed wonderfully on the right side.  He has secondary complaint of neuropathic pain and wanted to know/be educated on treatment options as well as suppository.  Objective:  There were no vitals filed for this visit. Podiatric Exam: Vascular: dorsalis pedis and posterior tibial pulses are palpable bilateral. Capillary return is immediate. Temperature gradient is WNL. Skin turgor WNL  Sensorium: Decreased Semmes Weinstein monofilament test.  Decreased tactile sensation bilaterally. Nail Exam: Pt has thick disfigured discolored nails with subungual debris noted left entire nail hallux through fifth toenails.  Pain on palpation to the nails. Ulcer Exam: There is no evidence of ulcer or pre-ulcerative changes or infection. Orthopedic Exam: Muscle tone and strength are WNL. No limitations in general ROM. No crepitus or effusions noted.  Right TMA. Skin: No Porokeratosis. No infection or ulcers    Assessment & Plan:   1. Neuropathic pain   2. Type II diabetes mellitus with neurological manifestations (HCC)   3. Pain due to onychomycosis of toenail of left foot     Patient was evaluated and treated and all questions answered.  Neuropathy -I explained to the patient the etiology of neuropathy and various treatment options were discussed.  Patient is already on gabapentin which seems to be helping somewhat.  I simply educated him on why the neuropathy happens and the importance of maintaining glucose control.  Patient states understanding and will continue to do so.  Onychomycosis with pain  -Nails  palliatively debrided as below. -Educated on self-care  Procedure: Nail Debridement to the left side Rationale: pain  Type of Debridement: manual, sharp debridement. Instrumentation: Nail nipper, rotary burr. Number of Nails: 5  Procedures and Treatment: Consent by patient was obtained for treatment procedures. The patient understood the discussion of treatment and procedures well. All questions were answered thoroughly reviewed. Debridement of mycotic and hypertrophic toenails, 1 through 5 bilateral and clearing of subungual debris. No ulceration, no infection noted.  Return Visit-Office Procedure: Patient instructed to return to the office for a follow up visit 3 months for continued evaluation and treatment.  Boneta Lucks, DPM    No follow-ups on file.

## 2020-08-06 ENCOUNTER — Encounter: Payer: Self-pay | Admitting: Nurse Practitioner

## 2020-08-21 ENCOUNTER — Ambulatory Visit: Payer: Medicare HMO | Admitting: Podiatry

## 2020-08-27 ENCOUNTER — Ambulatory Visit: Payer: Medicare HMO | Admitting: Nurse Practitioner

## 2020-09-16 ENCOUNTER — Other Ambulatory Visit: Payer: Self-pay

## 2020-09-16 ENCOUNTER — Encounter: Payer: Self-pay | Admitting: Podiatry

## 2020-09-16 ENCOUNTER — Ambulatory Visit: Payer: Medicare HMO | Admitting: Podiatry

## 2020-09-16 DIAGNOSIS — B351 Tinea unguium: Secondary | ICD-10-CM

## 2020-09-16 DIAGNOSIS — E1149 Type 2 diabetes mellitus with other diabetic neurological complication: Secondary | ICD-10-CM | POA: Diagnosis not present

## 2020-09-16 DIAGNOSIS — M79675 Pain in left toe(s): Secondary | ICD-10-CM

## 2020-09-16 NOTE — Progress Notes (Addendum)
  Subjective:  Patient ID: Cory Marks, male    DOB: [birth year],  MRN: [id number]  Chief Complaint  Patient presents with  . routine foot care    Nail trim    60 y.o. male returns for the above complaint.  Patient presents with thickened elongated dystrophic toenails to the left side 1 through 5.  He would like to have them debrided down as he is not able to do it himself.  The TMA has healed wonderfully on the right side.  He does not have any secondary complaints.  Objective:  There were no vitals filed for this visit. Podiatric Exam: Vascular: dorsalis pedis and posterior tibial pulses are palpable bilateral. Capillary return is immediate. Temperature gradient is WNL. Skin turgor WNL  Sensorium: Decreased Semmes Weinstein monofilament test.  Decreased tactile sensation bilaterally. Nail Exam: Pt has thick disfigured discolored nails with subungual debris noted left entire nail hallux through fifth toenails.  Pain on palpation to the nails. Ulcer Exam: There is no evidence of ulcer or pre-ulcerative changes or infection. Orthopedic Exam: Muscle tone and strength are WNL. No limitations in general ROM. No crepitus or effusions noted.  Right TMA.  Antalgic gait secondary to transmetatarsal amputation to the right side. Skin: No Porokeratosis. No infection or ulcers    Assessment & Plan:   1. Type II diabetes mellitus with neurological manifestations ([place])   2. Pain due to onychomycosis of toenail of left foot     Patient was evaluated and treated and all questions answered.  Neuropathy -I explained to the patient the etiology of neuropathy and various treatment options were discussed.  Patient is already on gabapentin which seems to be helping somewhat.  I simply educated him on why the neuropathy happens and the importance of maintaining glucose control.  Patient states understanding and will continue to do so.  History of right transmetatarsal amputation with underlying antalgic  gait -I explained the patient the etiology of transmetatarsal amputation and given that he has this amputation with the underlying antalgic gait of not being able to properly ambulate secondary to partial foot amputation he will benefit from diabetic shoes with insoles/custom toe filler.  This will prevent sliding/friction blisters within the shoe leading to worsening ulceration or further amputation.  Onychomycosis with pain  -Nails palliatively debrided as below. -Educated on self-care  Procedure: Nail Debridement to the left side Rationale: pain  Type of Debridement: manual, sharp debridement. Instrumentation: Nail nipper, rotary burr. Number of Nails: 5  Procedures and Treatment: Consent by patient was obtained for treatment procedures. The patient understood the discussion of treatment and procedures well. All questions were answered thoroughly reviewed. Debridement of mycotic and hypertrophic toenails, 1 through 5 bilateral and clearing of subungual debris. No ulceration, no infection noted.  Return Visit-Office Procedure: Patient instructed to return to the office for a follow up visit 3 months for continued evaluation and treatment.  Boneta Lucks, DPM    No follow-ups on file.

## 2020-09-17 ENCOUNTER — Ambulatory Visit: Payer: Medicare HMO | Admitting: Orthotics

## 2020-09-17 DIAGNOSIS — E1149 Type 2 diabetes mellitus with other diabetic neurological complication: Secondary | ICD-10-CM

## 2020-09-17 DIAGNOSIS — M792 Neuralgia and neuritis, unspecified: Secondary | ICD-10-CM

## 2020-09-17 DIAGNOSIS — E1142 Type 2 diabetes mellitus with diabetic polyneuropathy: Secondary | ICD-10-CM

## 2020-09-17 DIAGNOSIS — M779 Enthesopathy, unspecified: Secondary | ICD-10-CM

## 2020-09-17 NOTE — Progress Notes (Signed)
Cast Cory Marks today for F/O; however, he is being seen by NP at [facility].  Told him to get appoitmnemt with doc or see if doc will sign off on DBS for NP.

## 2020-10-05 ENCOUNTER — Telehealth: Payer: Self-pay | Admitting: Orthopedic Surgery

## 2020-10-05 NOTE — Telephone Encounter (Signed)
Received call from patient. He needs a note for his insurance company statig Dr. Durene Cal him to [facility] in June of 2021. please call patient [phone]

## 2020-10-07 ENCOUNTER — Other Ambulatory Visit: Payer: Self-pay

## 2020-10-07 DIAGNOSIS — Z9081 Acquired absence of spleen: Secondary | ICD-10-CM | POA: Insufficient documentation

## 2020-10-07 NOTE — Telephone Encounter (Signed)
I called pt and lm on vm to advise that letter is at the front desk for pick up. To call with any questions.

## 2020-10-26 ENCOUNTER — Telehealth: Payer: Self-pay | Admitting: Podiatry

## 2020-10-26 NOTE — Telephone Encounter (Signed)
Received voicemail from [place] @ [employer] clinical intake team for prior auth on L5000. None of the notes dictated that they received discussed pt needing the L5000 to improve pts gait or stability. Can you please call her and discuss so that we can get auth for the L5000. Her # is [phone].Marland Kitchen if not needs peer to peer in your box by 12pm tomorrow.

## 2020-10-27 NOTE — Telephone Encounter (Signed)
Hi Cory Marks,  Son made an addendum to my last note to mention the history of amputation and the need for diabetic shoes/insoles with toe filler.  I spoke with Cory Marks she just needs this last note faxed and then it should be able to clear it.  Let me know if you need anything else for me  Thank you  [name]

## 2020-10-30 ENCOUNTER — Telehealth: Payer: Self-pay | Admitting: Podiatry

## 2020-10-30 NOTE — Telephone Encounter (Signed)
Patient called in regards to diabetic shoes, currently waiting on prior authorization to return from patient medical insurance, Patient stated he will be changing insurance soon and wanted to have those covered under current plan. Patient was informed about the process and assured everything has been completed on our end and just waiting on response from [facility]

## 2020-11-05 ENCOUNTER — Telehealth: Payer: Self-pay | Admitting: Podiatry

## 2020-11-05 NOTE — Telephone Encounter (Signed)
Pt returned call when I was on the phone with pcp and is to stop by the office to look at the boot I am talking about to see if he would like it.

## 2020-11-05 NOTE — Telephone Encounter (Signed)
Left message for pt that I finally got the authorization from [place] [employer] for the L5000 but when ordering the shoe it is not available in his size. Asked for pt to call to discuss other options possibly same style shoe just in a tye up.

## 2020-11-05 NOTE — Telephone Encounter (Signed)
Pt came in and picked out orthofeet 489 boot size 13 w.

## 2020-11-08 DIAGNOSIS — H4052X3 Glaucoma secondary to other eye disorders, left eye, severe stage: Secondary | ICD-10-CM | POA: Insufficient documentation

## 2020-11-30 ENCOUNTER — Telehealth: Payer: Self-pay | Admitting: Podiatry

## 2020-11-30 NOTE — Telephone Encounter (Signed)
Received voicemail from [facility] health stating he received the diabetic shoe paperwork from [place] and believes it maybe a duplicate as they had signed it already..  I returned call and left message with [place] to let Ulice Dash know(He was with at pt) that we have not received the signed documents as of now and that is why it was faxed again. I asked her to have Ulice Dash call if any further questions.

## 2020-12-01 NOTE — Telephone Encounter (Signed)
Pt ft message asking if his diabetic shoes were in yet.   I returned call and explained we just got the documents yesterday and it usually takes a few weeks to get them in as they are making inserts now.

## 2020-12-16 ENCOUNTER — Ambulatory Visit: Payer: Medicare HMO | Admitting: Podiatry

## 2020-12-22 ENCOUNTER — Ambulatory Visit: Payer: Medicare (Managed Care) | Admitting: Podiatry

## 2020-12-29 ENCOUNTER — Other Ambulatory Visit: Payer: Self-pay

## 2020-12-29 ENCOUNTER — Ambulatory Visit (INDEPENDENT_AMBULATORY_CARE_PROVIDER_SITE_OTHER): Payer: Medicare (Managed Care) | Admitting: Podiatry

## 2020-12-29 DIAGNOSIS — E119 Type 2 diabetes mellitus without complications: Secondary | ICD-10-CM | POA: Diagnosis not present

## 2020-12-29 DIAGNOSIS — Z89411 Acquired absence of right great toe: Secondary | ICD-10-CM | POA: Diagnosis not present

## 2020-12-29 DIAGNOSIS — Z89421 Acquired absence of other right toe(s): Secondary | ICD-10-CM | POA: Diagnosis not present

## 2020-12-29 DIAGNOSIS — M779 Enthesopathy, unspecified: Secondary | ICD-10-CM

## 2020-12-29 DIAGNOSIS — E1142 Type 2 diabetes mellitus with diabetic polyneuropathy: Secondary | ICD-10-CM

## 2020-12-29 NOTE — Progress Notes (Signed)
The patient presented to the office today to pick up diabetic shoes and 3 pair diabetic custom inserts.  1 pair of inserts were put in the shoes and the shoes were fitted to the patient. The patient states they are comfortable and free of defect. He was satisfied with the fit of the shoe. Instructions for break in and wear were dispensed. The patient signed the delivery documentation and break in instruction form.  Patient is to contact the office if any concerns or questions arise

## 2021-02-22 DIAGNOSIS — G894 Chronic pain syndrome: Secondary | ICD-10-CM | POA: Insufficient documentation

## 2021-03-03 ENCOUNTER — Other Ambulatory Visit: Payer: Self-pay | Admitting: Family Medicine

## 2021-03-03 ENCOUNTER — Ambulatory Visit
Admission: RE | Admit: 2021-03-03 | Discharge: 2021-03-03 | Disposition: A | Discharge status: Home / Self Care | Payer: Medicare HMO | Source: Ambulatory Visit | Attending: Family Medicine | Admitting: Family Medicine

## 2021-03-03 ENCOUNTER — Other Ambulatory Visit: Payer: Self-pay

## 2021-03-03 DIAGNOSIS — R200 Anesthesia of skin: Secondary | ICD-10-CM

## 2021-03-03 DIAGNOSIS — R202 Paresthesia of skin: Secondary | ICD-10-CM

## 2021-05-07 ENCOUNTER — Encounter: Payer: Self-pay | Admitting: Cardiovascular Disease

## 2021-05-07 ENCOUNTER — Other Ambulatory Visit: Payer: Self-pay

## 2021-05-07 ENCOUNTER — Ambulatory Visit: Payer: Medicare (Managed Care) | Admitting: Cardiovascular Disease

## 2021-05-07 DIAGNOSIS — I4892 Unspecified atrial flutter: Secondary | ICD-10-CM

## 2021-05-07 DIAGNOSIS — Z01818 Encounter for other preprocedural examination: Secondary | ICD-10-CM | POA: Insufficient documentation

## 2021-05-07 DIAGNOSIS — I10 Essential (primary) hypertension: Secondary | ICD-10-CM

## 2021-05-07 DIAGNOSIS — E782 Mixed hyperlipidemia: Secondary | ICD-10-CM

## 2021-05-07 DIAGNOSIS — Z01810 Encounter for preprocedural cardiovascular examination: Secondary | ICD-10-CM

## 2021-05-07 NOTE — Assessment & Plan Note (Signed)
History of atrial flutter ablation by Dr. Curt Bears 09/12/2016 without recurrence.

## 2021-05-07 NOTE — Assessment & Plan Note (Signed)
History of hyperlipidemia on statin therapy with lipid profile performed 02/15/2018 revealing total cholesterol 184, LDL 72 and HDL 67.

## 2021-05-07 NOTE — Assessment & Plan Note (Signed)
History of essential hypertension blood pressure measured today 198/68.  He is on amlodipine and lisinopril.

## 2021-05-07 NOTE — Assessment & Plan Note (Signed)
[name] was referred by Dr. [name] for preoperative clearance before back surgery.  He does have history of treated hypertension and hyperlipidemia as well as a family history for heart disease.  I am getting a 2D echo and a Lexiscan Myoview to risk stratify.

## 2021-05-07 NOTE — Progress Notes (Signed)
05/07/2021 BRADEN CIMO   [birth year]  [id number]  Primary Physician Cipriano Mile, NP Primary Cardiologist: Lorretta Harp MD Lupe Carney, [place]  HPI:  Cory Marks is a 60 y.o. mildly overweight married African-American male father of 24, grandfather 1 grandchild referred by Dr. [name], orthopedic surgeon, for preoperative clearance before elective back surgery.  He is currently disabled because of mantle cell cancer.  His cardiac risk factor profile is notable for treated hypertension, hyperlipidemia.  He was diabetic in the past.  His father did have a myocardial infarction.  He is never had a heart attack or stroke.  He denies chest pain or shortness of breath.  He did have a flutter ablation by Dr. Curt Bears 09/12/2016.   Current Meds  Medication Sig   acetaminophen (TYLENOL) 325 MG tablet Take 650-975 mg by mouth every 6 (six) hours as needed for mild pain or headache.   amitriptyline (ELAVIL) 50 MG tablet amitriptyline 50 mg tablet  TAKE 1 TABLET BY MOUTH EVERY DAY AT BEDTIME   amLODipine (NORVASC) 10 MG tablet Take 1 tablet (10 mg total) by mouth every morning. (Patient taking differently: Take 10 mg by mouth daily.)   atorvastatin (LIPITOR) 10 MG tablet Take 10 mg by mouth daily.    dorzolamide (TRUSOPT) 2 % ophthalmic solution Place 1 drop into the left eye 2 times daily.   doxycycline (VIBRA-TABS) 100 MG tablet Take 1 tablet (100 mg total) by mouth 2 (two) times daily.   gabapentin (NEURONTIN) 300 MG capsule Take 600 mg by mouth 2 (two) times daily.    HYDROmorphone (DILAUDID) 4 MG tablet Take 4 mg by mouth 2 (two) times daily as needed.   lisinopril (ZESTRIL) 5 MG tablet Take 5 mg by mouth daily.    Multiple Vitamin (MULTIVITAMIN WITH MINERALS) TABS tablet Take 1 tablet by mouth daily.   penicillin v potassium (VEETID) 500 MG tablet Take 500 mg by mouth 2 (two) times daily.    pyridOXINE (VITAMIN B-6) 100 MG tablet Take 100 mg by mouth daily.   timolol (TIMOPTIC) 0.5  % ophthalmic solution Place 1 drop into the left eye 2 (two) times daily.    [DISCONTINUED] HYDROmorphone (DILAUDID) 2 MG tablet Take 2 mg by mouth at bedtime as needed for moderate pain or severe pain.      Allergies  Allergen Reactions   Oxycodone Itching and Palpitations    Social History   Socioeconomic History   Marital status: Married    Spouse name: Not on file   Number of children: Not on file   Years of education: Not on file   Highest education level: Not on file  Occupational History   Not on file  Tobacco Use   Smoking status: Never   Smokeless tobacco: Never  Vaping Use   Vaping Use: Never used  Substance and Sexual Activity   Alcohol use: Yes    Alcohol/week: 6.0 standard drinks    Types: 6 Cans of beer per week    Comment: occasional    Drug use: No    Comment: last use few years ago per patient - used marijuana ; denies use today 01-28-2019   Sexual activity: Yes    Partners: Female  Other Topics Concern   Not on file  Social History Narrative   Not on file   Social Determinants of Health   Financial Resource Strain: Not on file  Food Insecurity: Not on file  Transportation Needs: Not on file  Physical Activity: Not on file  Stress: Not on file  Social Connections: Not on file  Intimate Partner Violence: Not on file     Review of Systems: General: negative for chills, fever, night sweats or weight changes.  Cardiovascular: negative for chest pain, dyspnea on exertion, edema, orthopnea, palpitations, paroxysmal nocturnal dyspnea or shortness of breath Dermatological: negative for rash Respiratory: negative for cough or wheezing Urologic: negative for hematuria Abdominal: negative for nausea, vomiting, diarrhea, bright red blood per rectum, melena, or hematemesis Neurologic: negative for visual changes, syncope, or dizziness All other systems reviewed and are otherwise negative except as noted above.    Blood pressure 98/68, height 6' (1.829  m), weight 206 lb 9.6 oz (93.7 kg), SpO2 94 %.  General appearance: alert and no distress Neck: no adenopathy, no carotid bruit, no JVD, supple, symmetrical, trachea midline, and thyroid not enlarged, symmetric, no tenderness/mass/nodules Lungs: clear to auscultation bilaterally Heart: regular rate and rhythm, S1, S2 normal, no murmur, click, rub or gallop Extremities: extremities normal, atraumatic, no cyanosis or edema Pulses: 2+ and symmetric Skin: Skin color, texture, turgor normal. No rashes or lesions Neurologic: Grossly normal  EKG sinus bradycardia 56 with low limb voltage.  I personally reviewed this EKG.  ASSESSMENT AND PLAN:   HTN (hypertension) History of essential hypertension blood pressure measured today 198/68.  He is on amlodipine and lisinopril.  Hyperlipidemia History of hyperlipidemia on statin therapy with lipid profile performed 02/15/2018 revealing total cholesterol 184, LDL 72 and HDL 67.  Atrial flutter (HCC) History of atrial flutter ablation by Dr. Curt Bears 09/12/2016 without recurrence.  Preoperative clearance Mr. Fowlkes was referred by Dr. [name] for preoperative clearance before back surgery.  He does have history of treated hypertension and hyperlipidemia as well as a family history for heart disease.  I am getting a 2D echo and a Lexiscan Myoview to risk stratify.     Lorretta Harp MD FACP,FACC,FAHA, [facility] 05/07/2021 3:23 PM

## 2021-05-07 NOTE — Patient Instructions (Signed)
Medication Instructions:  Your physician recommends that you continue on your current medications as directed. Please refer to the Current Medication list given to you today.  *If you need a refill on your cardiac medications before your next appointment, please call your pharmacy*   Lab Work: Your physician recommends that you return for lab work in: next week or so for fasting lipid/liver profile.  If you have labs (blood work) drawn today and your tests are completely normal, you will receive your results only by: [place] (if you have MyChart) OR A paper copy in the mail If you have any lab test that is abnormal or we need to change your treatment, we will call you to review the results.   Testing/Procedures: Your physician has requested that you have an echocardiogram. Echocardiography is a painless test that uses sound waves to create images of your heart. It provides your doctor with information about the size and shape of your heart and how well your heart's chambers and valves are working. This procedure takes approximately one hour. There are no restrictions for this procedure. This procedure is done at [address] [place] physician has requested that you have a lexiscan myoview. For further information please visit [email or web address]. Please follow instruction sheet, as given. This will take place at [address], [address]  How to prepare for your Myocardial Perfusion Test: Do not eat or drink 3 hours prior to your test, except you may have water. Do not consume products containing caffeine (regular or decaffeinated) 12 hours prior to your test. (ex: coffee, chocolate, sodas, tea). Do bring a list of your current medications with you.  If not listed below, you may take your medications as normal. Do wear comfortable clothes (no dresses or overalls) and walking shoes, tennis shoes preferred (No heels or open toe shoes are allowed). Do NOT wear cologne, perfume,  aftershave, or lotions (deodorant is allowed). The test will take approximately 3 to 4 hours to complete If these instructions are not followed, your test will have to be rescheduled.   Follow-Up: At [facility], you and your health needs are our priority.  As part of our continuing mission to provide you with exceptional heart care, we have created designated Provider Care Teams.  These Care Teams include your primary Cardiologist (physician) and Advanced Practice Providers (APPs -  Physician Assistants and Nurse Practitioners) who all work together to provide you with the care you need, when you need it.  We recommend signing up for the patient portal called "MyChart".  Sign up information is provided on this After Visit Summary.  MyChart is used to connect with patients for Virtual Visits (Telemedicine).  Patients are able to view lab/test results, encounter notes, upcoming appointments, etc.  Non-urgent messages can be sent to your provider as well.   To learn more about what you can do with MyChart, go to [email or web address].    Your next appointment:   We will see you as needed. If you need to schedule an appointment please call our office.  Provider:   Quay Burow, MD

## 2021-05-11 ENCOUNTER — Ambulatory Visit: Payer: Medicare (Managed Care) | Admitting: Cardiology

## 2021-05-14 ENCOUNTER — Telehealth (HOSPITAL_COMMUNITY): Payer: Self-pay | Admitting: *Deleted

## 2021-05-14 NOTE — Telephone Encounter (Signed)
Patient given detailed instructions per Myocardial Perfusion Study Information Sheet for the test on 05/21/21 at 7:15. Patient notified to arrive 15 minutes early and that it is imperative to arrive on time for appointment to keep from having the test rescheduled.  If you need to cancel or reschedule your appointment, please call the office within 24 hours of your appointment. . Patient verbalized understanding.Cory Marks

## 2021-05-21 ENCOUNTER — Ambulatory Visit (HOSPITAL_COMMUNITY): Payer: Medicare (Managed Care) | Attending: Cardiology

## 2021-05-21 ENCOUNTER — Other Ambulatory Visit: Payer: Self-pay

## 2021-05-21 ENCOUNTER — Other Ambulatory Visit: Payer: Medicare (Managed Care) | Admitting: *Deleted

## 2021-05-21 ENCOUNTER — Ambulatory Visit (HOSPITAL_BASED_OUTPATIENT_CLINIC_OR_DEPARTMENT_OTHER): Payer: Medicare (Managed Care)

## 2021-05-21 VITALS — Ht 72.0 in | Wt 206.0 lb

## 2021-05-21 DIAGNOSIS — N189 Chronic kidney disease, unspecified: Secondary | ICD-10-CM | POA: Diagnosis not present

## 2021-05-21 DIAGNOSIS — I10 Essential (primary) hypertension: Secondary | ICD-10-CM

## 2021-05-21 DIAGNOSIS — R110 Nausea: Secondary | ICD-10-CM | POA: Diagnosis present

## 2021-05-21 DIAGNOSIS — Z8249 Family history of ischemic heart disease and other diseases of the circulatory system: Secondary | ICD-10-CM | POA: Insufficient documentation

## 2021-05-21 DIAGNOSIS — Z01818 Encounter for other preprocedural examination: Secondary | ICD-10-CM | POA: Diagnosis not present

## 2021-05-21 DIAGNOSIS — I5022 Chronic systolic (congestive) heart failure: Secondary | ICD-10-CM | POA: Diagnosis not present

## 2021-05-21 DIAGNOSIS — R001 Bradycardia, unspecified: Secondary | ICD-10-CM | POA: Diagnosis not present

## 2021-05-21 DIAGNOSIS — G510 Bell's palsy: Secondary | ICD-10-CM | POA: Insufficient documentation

## 2021-05-21 DIAGNOSIS — E1122 Type 2 diabetes mellitus with diabetic chronic kidney disease: Secondary | ICD-10-CM | POA: Insufficient documentation

## 2021-05-21 DIAGNOSIS — I351 Nonrheumatic aortic (valve) insufficiency: Secondary | ICD-10-CM | POA: Insufficient documentation

## 2021-05-21 DIAGNOSIS — C8590 Non-Hodgkin lymphoma, unspecified, unspecified site: Secondary | ICD-10-CM | POA: Diagnosis not present

## 2021-05-21 DIAGNOSIS — Z01810 Encounter for preprocedural cardiovascular examination: Secondary | ICD-10-CM

## 2021-05-21 DIAGNOSIS — I130 Hypertensive heart and chronic kidney disease with heart failure and stage 1 through stage 4 chronic kidney disease, or unspecified chronic kidney disease: Secondary | ICD-10-CM | POA: Insufficient documentation

## 2021-05-21 LAB — MYOCARDIAL PERFUSION IMAGING
LV dias vol: 121 mL (ref 62–150)
LV sys vol: 51 mL
Nuc Stress EF: 58 %
Peak HR: 63 {beats}/min
Rest HR: 41 {beats}/min
Rest Nuclear Isotope Dose: 10.6 mCi
SDS: 3
SRS: 0
SSS: 3
ST Depression (mm): 0 mm
Stress Nuclear Isotope Dose: 32.4 mCi
TID: 0.91

## 2021-05-21 LAB — ECHOCARDIOGRAM COMPLETE
Area-P 1/2: 3.27 cm2
Height: 72 in
P 1/2 time: 1511 msec
S' Lateral: 3.1 cm
Weight: 3296 oz

## 2021-05-21 LAB — LIPID PANEL
Chol/HDL Ratio: 3.6 ratio (ref 0.0–5.0)
Cholesterol, Total: 161 mg/dL (ref 100–199)
HDL: 45 mg/dL (ref >39)
LDL Chol Calc (NIH): 90 mg/dL (ref 0–99)
Triglycerides: 149 mg/dL (ref 0–149)
VLDL Cholesterol Cal: 26 mg/dL (ref 5–40)

## 2021-05-21 LAB — HEPATIC FUNCTION PANEL
ALT: 25 IU/L (ref 0–44)
AST: 20 IU/L (ref 0–40)
Albumin: 4.1 g/dL (ref 3.8–4.8)
Alkaline Phosphatase: 59 IU/L (ref 44–121)
Bilirubin Total: 0.5 mg/dL (ref 0.0–1.2)
Bilirubin, Direct: 0.14 mg/dL (ref 0.00–0.40)
Total Protein: 5.6 g/dL — ABNORMAL LOW (ref 6.0–8.5)

## 2021-05-21 MED ORDER — TECHNETIUM TC 99M TETROFOSMIN IV KIT
32.4000 | PACK | Freq: Once | INTRAVENOUS | Status: AC | PRN
  Administered 2021-05-21: 32.4 via INTRAVENOUS
  Filled 2021-05-21: qty 33

## 2021-05-21 MED ORDER — TECHNETIUM TC 99M TETROFOSMIN IV KIT
10.6000 | PACK | Freq: Once | INTRAVENOUS | Status: AC | PRN
  Administered 2021-05-21: 10.6 via INTRAVENOUS
  Filled 2021-05-21: qty 11

## 2021-05-21 MED ORDER — REGADENOSON 0.4 MG/5ML IV SOLN
0.4000 mg | Freq: Once | INTRAVENOUS | Status: AC
  Administered 2021-05-21: 0.4 mg via INTRAVENOUS

## 2021-05-21 MED ORDER — AMINOPHYLLINE 25 MG/ML IV SOLN
75.0000 mg | Freq: Once | INTRAVENOUS | Status: AC
Start: 2021-05-21 — End: 2021-05-21
  Administered 2021-05-21: 75 mg via INTRAVENOUS

## 2021-05-24 ENCOUNTER — Telehealth: Payer: Self-pay

## 2021-05-24 NOTE — Telephone Encounter (Signed)
   [facility] Pre-operative Risk Assessment    Patient Name: Cory Marks  DOB: 22-Oct-1960 MRN: [id number]  HEARTCARE STAFF:  - IMPORTANT!!!!!! Under Visit Info/Reason for Call, type in Other and utilize the format Clearance MM/DD/YY or Clearance TBD. Do not use dashes or single digits. - Please review there is not already an duplicate clearance open for this procedure. - If request is for dental extraction, please clarify the # of teeth to be extracted. - If the patient is currently at the dentist's office, call Pre-Op Callback Staff (MA/nurse) to input urgent request.  - If the patient is not currently in the dentist office, please route to the Pre-Op pool.  Request for surgical clearance:  What type of surgery is being performed? OLIF L4-5 with PSFI   When is this surgery scheduled? 06/16/21  What type of clearance is required (medical clearance vs. Pharmacy clearance to hold med vs. Both)? Medical clearance   Are there any medications that need to be held prior to surgery and how long? None listed   Practice name and name of physician performing surgery? [facility], Dr. Melina Schools   What is the office phone number? [phone]    7.   What is the office fax number? [phone]  8.   Anesthesia type (None, local, MAC, general) ? General    Jacqulynn Cadet 05/24/2021, 1:23 PM  _________________________________________________________________   (provider comments below)

## 2021-05-25 NOTE — Telephone Encounter (Signed)
    Patient Name: Cory Marks  DOB: 1961/01/30 MRN: [id number]  Primary Cardiologist: Will Meredith Leeds, MD  Chart reviewed as part of pre-operative protocol coverage. Given past medical history and time since last visit, based on ACC/AHA guidelines, ELLARD NAN would be at acceptable risk for the planned procedure without further cardiovascular testing.  Patient recently underwent a nuclear stress test which came back reassuring.  He has been cleared by Dr. Gwenlyn Found to proceed with surgery.  The patient was advised that if he develops new symptoms prior to surgery to contact our office to arrange for a follow-up visit, and he verbalized understanding.  I will route this recommendation to the requesting party via Epic fax function and remove from pre-op pool.  Please call with questions.  [place], [place] 05/25/2021, 6:41 PM

## 2021-05-31 ENCOUNTER — Ambulatory Visit: Payer: Self-pay | Admitting: Orthopedic Surgery

## 2021-05-31 DIAGNOSIS — Z01818 Encounter for other preprocedural examination: Secondary | ICD-10-CM

## 2021-06-03 ENCOUNTER — Other Ambulatory Visit: Payer: Self-pay

## 2021-06-07 ENCOUNTER — Ambulatory Visit: Payer: Self-pay | Admitting: Orthopedic Surgery

## 2021-06-07 NOTE — H&P (Signed)
Subjective:   CC: back pain and left radicular leg pain. He has had multiple injections, as well as formalized and self-directed physical therapy. Pain management with Dr. Nelva Bush on Hydromorphone. Despite these modalities his pain persists and he has very little quality-of-life. He is now dependent on the walker in order to maintain his stability. He has elected to move forward with surgical interventions. He is scheduled for OLIF L4-L5 W. PSFI at [facility] on 06/16/21 with Dr. [name].  Patient Active Problem List   Diagnosis Date Noted   Preoperative clearance 05/07/2021   Chronic pain syndrome 02/22/2021   Secondary open-angle glaucoma, left, severe stage 11/08/2020   H/O splenectomy 10/07/2020   Acute hematogenous osteomyelitis of right foot ([place]) 01/22/2020   Cutaneous abscess of right foot    S/P reverse total shoulder arthroplasty, right 01/31/2019   Spinal stenosis of lumbar region 10/10/2018   Oropharyngeal dysphagia 07/11/2018   Pneumonia in aspergillosis ([place]) 07/11/2018   Degeneration of lumbar intervertebral disc 05/01/2018   History of total knee replacement, left 03/21/2018   Age-related nuclear cataract of right eye 01/01/2018   Aphakia of eye, left 01/01/2018   Ocular hypertension of left eye 01/01/2018   Retained lens material following cataract surgery of left eye 09/22/2017   OA (osteoarthritis) of knee 01/30/2017   Right foot pain 11/04/2016   Amputation, toe, traumatic, right, sequela ([place]) 11/04/2016   Neuropathy 11/04/2016   Typical atrial flutter ([place]) 09/12/2016   Encounter for therapeutic drug monitoring 04/18/2016   H/O asplenia 06/15/2015   Bilateral right-sidedness sequence 81/44/8185   Chronic systolic congestive heart failure ([place]) 10/20/2014   Subacute osteomyelitis, right ankle and foot ([place]) 10/19/2014   Osteomyelitis of fifth toe of right foot ([place]) 10/17/2014   Septic arthritis of interphalangeal joint of toe of right foot ([employer]) 10/17/2014   Atrial  flutter ([place]) 10/17/2014   History of lymphoma 05/30/2014   CKD (chronic kidney disease) stage 3, GFR 30-59 ml/min (HCC) 02/27/2014   Hyperlipidemia 01/23/2014   Ulcer of right foot ([place]) 01/23/2014   Right foot drop 01/23/2014   CAD in native artery 63/14/9702   Metabolic syndrome 63/78/5885   Encounter for screening colonoscopy 12/26/2013   History of bradycardia 12/26/2013   HTN (hypertension) 09/12/2012   Lymphoma ([place]) 09/12/2012   Diabetic peripheral neuropathy ([place]) 12/05/2011   Erectile dysfunction 12/05/2011   H/O autologous stem cell transplant ([place]) 05/30/2011   Mantle cell lymphoma ([place]) 05/05/2011   Past Medical History:  Diagnosis Date   Arthritis    Bell's palsy    left eye droops a little   CAD (coronary artery disease)    Cancer ([place]) 2012   Mantle Cell Cancer- S/P stem cell transplant   CKD (chronic kidney disease) stage 3, GFR 30-59 ml/min ([place])    per lov with pcp,  last seen at [facility] january 2020 per patient, stable per patient    Diabetes mellitus without complication ([place])    hx of lost weight due to cancer not on medications since 2014    Dysrhythmia    atrial fib and atrial flutter, no repeat of dysrhymia since ablation in 2018 ,    Eye injury    left eye hit by a wood chip early june 2020, lov for f/u with opthamologist 6-17 , drained a pustule,  progress notes mention eye gradually improving with application of antibiotic  eye drops    History of chemotherapy last tx 2014   History of diabetes mellitus NONE SINCE 2012 CANCER  History of splenectomy    Hyperlipemia    Hypertension    Neuromuscular disorder ([place])    peripheral neuropathy   Neuropathy    right leg due to football injury   Polysubstance abuse ([place])    Sinus bradycardia     Past Surgical History:  Procedure Laterality Date   A-FLUTTER ABLATION N/A 09/12/2016   Procedure: A-Flutter Ablation;  Surgeon: Will Meredith Leeds, MD;  Location: [facility];  Service:  Cardiovascular;  Laterality: N/A;   AMPUTATION Right 10/20/2014   Procedure: AMPUTATION RIGHT GREAT TOE;  Surgeon: Gaynelle Arabian, MD;  Location: [facility];  Service: Orthopedics;  Laterality: Right;   AMPUTATION Right 12/25/2014   Procedure: RIGHT 2ND TOE AMPUTATION ;  Surgeon: Wylene Simmer, MD;  Location: [facility];  Service: Orthopedics;  Laterality: Right;   AMPUTATION Right 12/20/2019   Procedure: RIGHT FOOT AMPUTATION TOES;  Surgeon: Newt Minion, MD;  Location: [facility];  Service: Orthopedics;  Laterality: Right;   AMPUTATION Right 01/22/2020   Procedure: RIGHT FOOT 5TH RAY AMPUTATION;  Surgeon: Newt Minion, MD;  Location: [facility];  Service: Orthopedics;  Laterality: Right;   edentulation N/A y-2   prior to transplant   EYE SURGERY Left    caratct   REVERSE SHOULDER ARTHROPLASTY Right 01/31/2019   Procedure: REVERSE SHOULDER ARTHROPLASTY;  Surgeon: Justice Britain, MD;  [facility];  Service: Orthopedics;  Laterality: Right;   right knee surgery Right    nerve graft back of leg   SPLENECTOMY, TOTAL     mantle cell lymphoma   TENOLYSIS Right 12/25/2014   Procedure: RIGHT 3,4,5 FLEXOR TENDON RELEASE ;  Surgeon: Wylene Simmer, MD;  Location: [facility];  Service: Orthopedics;  Laterality: Right;   TOTAL KNEE ARTHROPLASTY Left 01/30/2017   Procedure: LEFT TOTAL KNEE ARTHROPLASTY;  Surgeon: Gaynelle Arabian, MD;  [facility];  Service: Orthopedics;  Laterality: Left;    Current Outpatient Medications  Medication Sig Dispense Refill Last Dose   acetaminophen (TYLENOL) 325 MG tablet Take 650-975 mg by mouth every 6 (six) hours as needed for mild pain or headache.      amitriptyline (ELAVIL) 50 MG tablet amitriptyline 50 mg tablet  TAKE 1 TABLET BY MOUTH EVERY DAY AT BEDTIME      amLODipine (NORVASC) 10 MG tablet Take 1 tablet (10 mg total) by mouth every morning. (Patient taking differently: Take 10 mg by mouth daily.) 90 tablet 1    aspirin EC 81 MG EC tablet  Take 1 tablet (81 mg total) by mouth daily. (Patient not taking: Reported on 05/07/2021)      atorvastatin (LIPITOR) 10 MG tablet Take 10 mg by mouth daily.       diclofenac Sodium (VOLTAREN) 1 % GEL Apply 2 g topically daily as needed (pain).  (Patient not taking: Reported on 05/07/2021)      dorzolamide (TRUSOPT) 2 % ophthalmic solution Place 1 drop into the left eye 2 times daily.      doxycycline (VIBRA-TABS) 100 MG tablet Take 1 tablet (100 mg total) by mouth 2 (two) times daily. 60 tablet 0    ELDERBERRY PO Take 1 tablet by mouth daily. (Patient not taking: Reported on 05/07/2021)      gabapentin (NEURONTIN) 300 MG capsule Take 600 mg by mouth 2 (two) times daily.       HYDROmorphone (DILAUDID) 4 MG tablet Take 4 mg by mouth 2 (two) times daily as needed.  lisinopril (ZESTRIL) 5 MG tablet Take 5 mg by mouth daily.       Multiple Vitamin (MULTIVITAMIN WITH MINERALS) TABS tablet Take 1 tablet by mouth daily.      pantoprazole (PROTONIX) 40 MG tablet Take 40 mg by mouth daily.      penicillin v potassium (VEETID) 500 MG tablet Take 500 mg by mouth 2 (two) times daily.       pyridOXINE (VITAMIN B-6) 100 MG tablet Take 100 mg by mouth daily.      timolol (TIMOPTIC) 0.5 % ophthalmic solution Place 1 drop into the left eye 2 (two) times daily.       valACYclovir (VALTREX) 1000 MG tablet valacyclovir 1 gram tablet (Patient not taking: Reported on 05/07/2021)      No current facility-administered medications for this visit.   Allergies  Allergen Reactions   Oxycodone Itching and Palpitations    Social History   Tobacco Use   Smoking status: Never   Smokeless tobacco: Never  Substance Use Topics   Alcohol use: Yes    Alcohol/week: 6.0 standard drinks    Types: 6 Cans of beer per week    Comment: occasional     Family History  Problem Relation Age of Onset   Hypertension Mother    Diabetes Mother    Cancer Father        pancreatic cancer   Diabetes Father    Heart disease Father     Diabetes Sister     Review of Systems Pertinent items are noted in HPI.  Objective:   Vitals: Ht: 5 ft 10 in 06/07/2021 08:16 am BP: 130/100 06/07/2021 08:21 am Pulse: 52 bpm 06/07/2021 08:22 am  Clinical exam: Cory Marks is a pleasant individual, who appears younger than their stated age. He is alert and orientated 3. No shortness of breath, chest pain.  Heart: RRR, no rubs, murmers, or gallops  Lungs:CTAB  Abdomen is soft and non-tender, negative loss of bowel and bladder control, no rebound tenderness. BSx4  Negative: skin lesions abrasions contusions Peripheral pulses: 2+ dorsalis pedis/posterior tibialis pulses. Compartment soft and nontender.  Gait pattern: Altered gait pattern due to significant back pain with neuropathic lower extremity pain left worse than the right. Assistive devices: Rolling walker. Patient has significant pain that prevents him from ambulating great distances without the walker.  Neuro: Chronic right foot drop 40 years. Left lower extremity: 3+/5 EHL/tibialis anterior strength. 4+/5 gastrocnemius strength. Negative straight leg raise test bilaterally. Negative Babinski test, no clonus, positive numbness and dysesthesias as well as radicular pain primarily in the left L5 dermatome. Chronic numbness into the right lower extremity diffusely from the knee to the foot. 5/5 motor strength hip/quad/hamstring.  Musculoskeletal: Significant back pain radiating predominantly into the left lower extremity. No SI joint pain.  Lumbar x-rays taken today in the office (AP/lateral/spot lateral) were reviewed: Demonstrate a grade 1 borderline 2 degenerative spondylolisthesis at L4-5 with moderate degenerative disc disease. No scoliosis. No significant degenerative disc disease at the remainder of the levels. No fracture is seen.  Lumbar MRI: completed on 03/20/21 was reviewed with the patient. It was completed at [facility]; I have independently reviewed the images as  well as the radiology report. Severe L4-5 spinal stenosis with severe bilateral recess stenosis affecting the traversing L5 nerve root as well as significant left foraminal stenosis affecting the exiting L4 nerve root. Grade 1 spondylolisthesis at L4-5 is noted. Severe bilateral neuroforaminal narrowing at L5-S1 more pronounced on the left with impingement on  the exiting L5 nerve root. No spondylolisthesis. All right anteriorly directed synovial cyst with thickening of the facet capsules noted.  Assessment:   Cory Marks is a very pleasant 60 year old gentleman who has had prolonged conservative care for his severe back pain and left radicular leg pain. He has had multiple injections, as well as formalized and self-directed physical therapy. Despite these modalities his pain persists and he has very little quality-of-life. He is now dependent on the walker in order to maintain his stability. At this point time of gone over his pathology and imaging studies in great detail. I believe the primary source of his problem is the L4-5 degenerative spondylolisthesis with severe stenosis. However, the foraminal disease at L5-S1 on the left side can also be contributing to his L5 radicular symptoms. At this point in order to address the instability and stenosis at L4-5 I have recommended an oblique lumbar interbody fusion with supplemental posterior screw fixation. In order to address the L5 foraminal disease he would need a foraminotomy. I am hesitant to recommend a 2 level fusion as the disc space at L5-S1 is not collapsed or degenerated significantly. I have explained to him that I believe the primary pathology is at L4-5 but they foraminal stenosis at L5-S1 could be contributing to his symptoms.  Plan:   At this point time I believe the best option is to address the L4-5 pathology.  I did indicate to him that in the future he may require a formal L5 foraminotomy. However, I believe the main issue is the lateral  recess stenosis and L5 nerve compression at L4-5. This can be addressed with the OLIF.  OLIF/XLIF risks, benefits of surgery were reviewed with the patient. These include: infection, bleeding, death, stroke, paralysis, ongoing or worse pain, need for additional surgery, injury to the lumbar plexus resulting in hip flexor weakness and difficulty walking without assistive devices. Adjacent segment degenerative disease, need for additional surgery including fusing other levels, leak of spinal fluid, Nonunion, hardware failure, breakage, or mal-position. Deep venous thrombosis (DVT) requiring additional treatment such as filter, and/or medications. Injury to abdominal contents, loss in bowel and bladder control.  We have obtained pre-op medical clearance from cardiologist. We have not yet recieved clearance from PCP, he is scheduled for CXR on Wednesday.  He has not heard from the vascular surgeons office yet. We will contact them.  I reviewed his medication list with him. He is not on any blood thinners. Not using any aspirin. Not using any NSAIDs. He will hold his vitamins and supplements.  We have also discussed the post-operative recovery period to include: bathing/showering restrictions, wound healing, activity (and driving) restrictions, medications/pain mangement. He is enrolled in pain management with Dr. Nelva Bush. He is on hydromorphone until I did indicate to him that after surgery his medications will be prescribed by Dr. Ellard Artis  We have also discussed post-operative redflags to include: signs and symptoms of postoperative infection, DVT/PE.  Follow-up: 2 weeks postop

## 2021-06-08 NOTE — Pre-Procedure Instructions (Signed)
Surgical Instructions    Your procedure is scheduled on Wednesday 06/16/21.   Report to [facility] Main Entrance "A" at 06:30 A.M., then check in with the Admitting office.  Call this number if you have problems the morning of surgery:  [phone]   If you have any questions prior to your surgery date call [phone]: Open Monday-Friday 8am-4pm    Remember:  Do not eat after midnight the night before your surgery  You may drink clear liquids until 05:30 A.M. the morning of your surgery.   Clear liquids allowed are: Water, Non-Citrus Juices (without pulp), Carbonated Beverages, Clear Tea, Black Coffee ONLY (NO MILK, CREAM OR POWDERED CREAMER of any kind), and Gatorade    Take these medicines the morning of surgery with A SIP OF WATER   amLODipine (NORVASC)   atorvastatin (LIPITOR)  dorzolamide (TRUSOPT)   gabapentin (NEURONTIN)  pantoprazole (PROTONIX)   timolol (TIMOPTIC)   Take these medicines if needed:   acetaminophen (TYLENOL)  HYDROmorphone (DILAUDID)  As of today, STOP taking any Aspirin (unless otherwise instructed by your surgeon) Aleve, Naproxen, Ibuprofen, Motrin, Advil, Goody's, BC's, all herbal medications, fish oil, and all vitamins.   WHAT DO I DO ABOUT MY DIABETES MEDICATION?   Do not take oral diabetes medicines (pills) the morning of surgery.  The day of surgery, do not take other diabetes injectables, including Byetta (exenatide), Bydureon (exenatide ER), Victoza (liraglutide), or Trulicity (dulaglutide).  HOW TO MANAGE YOUR DIABETES BEFORE AND AFTER SURGERY  Why is it important to control my blood sugar before and after surgery? Improving blood sugar levels before and after surgery helps healing and can limit problems. A way of improving blood sugar control is eating a healthy diet by:  Eating less sugar and carbohydrates  Increasing activity/exercise  Talking with your doctor about reaching your blood sugar goals High blood sugars (greater than  180 mg/dL) can raise your risk of infections and slow your recovery, so you will need to focus on controlling your diabetes during the weeks before surgery. Make sure that the doctor who takes care of your diabetes knows about your planned surgery including the date and location.  How do I manage my blood sugar before surgery? Check your blood sugar at least 4 times a day, starting 2 days before surgery, to make sure that the level is not too high or low.  Check your blood sugar the morning of your surgery when you wake up and every 2 hours until you get to the Short Stay unit.  If your blood sugar is less than 70 mg/dL, you will need to treat for low blood sugar: Do not take insulin. Treat a low blood sugar (less than 70 mg/dL) with  cup of clear juice (cranberry or apple), 4 glucose tablets, OR glucose gel. Recheck blood sugar in 15 minutes after treatment (to make sure it is greater than 70 mg/dL). If your blood sugar is not greater than 70 mg/dL on recheck, call [phone] for further instructions. Report your blood sugar to the short stay nurse when you get to Short Stay.  If you are admitted to the hospital after surgery: Your blood sugar will be checked by the staff and you will probably be given insulin after surgery (instead of oral diabetes medicines) to make sure you have good blood sugar levels. The goal for blood sugar control after surgery is 80-180 mg/dL.   After your COVID test   You are not required to quarantine however you are required  to wear a well-fitting mask when you are out and around people not in your household.  If your mask becomes wet or soiled, replace with a new one.  Wash your hands often with soap and water for 20 seconds or clean your hands with an alcohol-based hand sanitizer that contains at least 60% alcohol.  Do not share personal items.  Notify your provider: if you are in close contact with someone who has COVID  or if you develop a fever of  100.4 or greater, sneezing, cough, sore throat, shortness of breath or body aches.             Do not wear jewelry or makeup Do not wear lotions, powders, perfumes/colognes, or deodorant. Do not shave 48 hours prior to surgery.  Men may shave face and neck. Do not bring valuables to the hospital. DO Not wear nail polish, gel polish, artificial nails, or any other type of covering on natural nails including finger and toenails. If patients have artificial nails, gel coating, etc. that need to be removed by a nail salon, please have this removed prior to surgery or surgery may need to be canceled/delayed if the surgeon/ anesthesia feels like the patient is unable to be adequately monitored.             [facility] is not responsible for any belongings or valuables.  Do NOT Smoke (Tobacco/Vaping)  24 hours prior to your procedure  If you use a CPAP at night, you may bring your mask for your overnight stay.   Contacts, glasses, hearing aids, dentures or partials may not be worn into surgery, please bring cases for these belongings   For patients admitted to the hospital, discharge time will be determined by your treatment team.   Patients discharged the day of surgery will not be allowed to drive home, and someone needs to stay with them for 24 hours.  NO VISITORS WILL BE ALLOWED IN PRE-OP WHERE PATIENTS ARE PREPPED FOR SURGERY.  ONLY 1 SUPPORT PERSON MAY BE PRESENT IN THE WAITING ROOM WHILE YOU ARE IN SURGERY.  IF YOU ARE TO BE ADMITTED, ONCE YOU ARE IN YOUR ROOM YOU WILL BE ALLOWED TWO (2) VISITORS. 1 (ONE) VISITOR MAY STAY OVERNIGHT BUT MUST ARRIVE TO THE ROOM BY 8pm.  Minor children may have two parents present. Special consideration for safety and communication needs will be reviewed on a case by case basis.  Special instructions:    Oral Hygiene is also important to reduce your risk of infection.  Remember - BRUSH YOUR TEETH THE MORNING OF SURGERY WITH YOUR REGULAR TOOTHPASTE   [facility]- Preparing For Surgery  Before surgery, you can play an important role. Because skin is not sterile, your skin needs to be as free of germs as possible. You can reduce the number of germs on your skin by washing with CHG (chlorahexidine gluconate) Soap before surgery.  CHG is an antiseptic cleaner which kills germs and bonds with the skin to continue killing germs even after washing.     Please do not use if you have an allergy to CHG or antibacterial soaps. If your skin becomes reddened/irritated stop using the CHG.  Do not shave (including legs and underarms) for at least 48 hours prior to first CHG shower. It is OK to shave your face.  Please follow these instructions carefully.     Shower the NIGHT BEFORE SURGERY and the MORNING OF SURGERY with CHG Soap.   If you  chose to wash your hair, wash your hair first as usual with your normal shampoo. After you shampoo, rinse your hair and body thoroughly to remove the shampoo.  Then [employer] and genitals (private parts) with your normal soap and rinse thoroughly to remove soap.  After that Use CHG Soap as you would any other liquid soap. You can apply CHG directly to the skin and wash gently with a scrungie or a clean washcloth.   Apply the CHG Soap to your body ONLY FROM THE NECK DOWN.  Do not use on open wounds or open sores. Avoid contact with your eyes, ears, mouth and genitals (private parts). Wash Face and genitals (private parts)  with your normal soap.   Wash thoroughly, paying special attention to the area where your surgery will be performed.  Thoroughly rinse your body with warm water from the neck down.  DO NOT shower/wash with your normal soap after using and rinsing off the CHG Soap.  Pat yourself dry with a CLEAN TOWEL.  Wear CLEAN PAJAMAS to bed the night before surgery  Place CLEAN SHEETS on your bed the night before your surgery  DO NOT SLEEP WITH PETS.   Day of Surgery:  Take a shower with CHG soap. Wear  Clean/Comfortable clothing the morning of surgery Do not apply any deodorants/lotions.   Remember to brush your teeth WITH YOUR REGULAR TOOTHPASTE.   Please read over the following fact sheets that you were given.

## 2021-06-09 ENCOUNTER — Other Ambulatory Visit: Payer: Self-pay

## 2021-06-09 ENCOUNTER — Encounter (HOSPITAL_COMMUNITY): Payer: Self-pay

## 2021-06-09 ENCOUNTER — Encounter (HOSPITAL_COMMUNITY)
Admission: RE | Admit: 2021-06-09 | Discharge: 2021-06-09 | Disposition: A | Discharge status: Home / Self Care | Payer: Medicare (Managed Care) | Source: Ambulatory Visit | Attending: Orthopedic Surgery | Admitting: Orthopedic Surgery

## 2021-06-09 VITALS — BP 123/85 | HR 59 | Temp 98.2°F | Resp 18 | Ht 72.0 in | Wt 210.3 lb

## 2021-06-09 DIAGNOSIS — M5116 Intervertebral disc disorders with radiculopathy, lumbar region: Secondary | ICD-10-CM | POA: Diagnosis not present

## 2021-06-09 DIAGNOSIS — Z96612 Presence of left artificial shoulder joint: Secondary | ICD-10-CM | POA: Diagnosis not present

## 2021-06-09 DIAGNOSIS — Z96652 Presence of left artificial knee joint: Secondary | ICD-10-CM | POA: Insufficient documentation

## 2021-06-09 DIAGNOSIS — Z89411 Acquired absence of right great toe: Secondary | ICD-10-CM | POA: Diagnosis not present

## 2021-06-09 DIAGNOSIS — F1910 Other psychoactive substance abuse, uncomplicated: Secondary | ICD-10-CM | POA: Diagnosis not present

## 2021-06-09 DIAGNOSIS — Z9081 Acquired absence of spleen: Secondary | ICD-10-CM | POA: Insufficient documentation

## 2021-06-09 DIAGNOSIS — M1990 Unspecified osteoarthritis, unspecified site: Secondary | ICD-10-CM | POA: Insufficient documentation

## 2021-06-09 DIAGNOSIS — Z9221 Personal history of antineoplastic chemotherapy: Secondary | ICD-10-CM | POA: Insufficient documentation

## 2021-06-09 DIAGNOSIS — E785 Hyperlipidemia, unspecified: Secondary | ICD-10-CM | POA: Insufficient documentation

## 2021-06-09 DIAGNOSIS — I4891 Unspecified atrial fibrillation: Secondary | ICD-10-CM | POA: Diagnosis not present

## 2021-06-09 DIAGNOSIS — I2510 Atherosclerotic heart disease of native coronary artery without angina pectoris: Secondary | ICD-10-CM | POA: Insufficient documentation

## 2021-06-09 DIAGNOSIS — N183 Chronic kidney disease, stage 3 unspecified: Secondary | ICD-10-CM | POA: Insufficient documentation

## 2021-06-09 DIAGNOSIS — I1310 Hypertensive heart and chronic kidney disease without heart failure, with stage 1 through stage 4 chronic kidney disease, or unspecified chronic kidney disease: Secondary | ICD-10-CM | POA: Diagnosis not present

## 2021-06-09 DIAGNOSIS — I351 Nonrheumatic aortic (valve) insufficiency: Secondary | ICD-10-CM | POA: Insufficient documentation

## 2021-06-09 DIAGNOSIS — E1142 Type 2 diabetes mellitus with diabetic polyneuropathy: Secondary | ICD-10-CM | POA: Diagnosis not present

## 2021-06-09 DIAGNOSIS — Z01810 Encounter for preprocedural cardiovascular examination: Secondary | ICD-10-CM | POA: Insufficient documentation

## 2021-06-09 DIAGNOSIS — E1122 Type 2 diabetes mellitus with diabetic chronic kidney disease: Secondary | ICD-10-CM | POA: Diagnosis not present

## 2021-06-09 DIAGNOSIS — I739 Peripheral vascular disease, unspecified: Secondary | ICD-10-CM | POA: Insufficient documentation

## 2021-06-09 DIAGNOSIS — Z01818 Encounter for other preprocedural examination: Secondary | ICD-10-CM

## 2021-06-09 DIAGNOSIS — R001 Bradycardia, unspecified: Secondary | ICD-10-CM | POA: Diagnosis not present

## 2021-06-09 LAB — PROTIME-INR
INR: 1 (ref 0.8–1.2)
Prothrombin Time: 13.4 seconds (ref 11.4–15.2)

## 2021-06-09 LAB — COMPREHENSIVE METABOLIC PANEL
ALT: 35 U/L (ref 0–44)
AST: 31 U/L (ref 15–41)
Albumin: 4.1 g/dL (ref 3.5–5.0)
Alkaline Phosphatase: 50 U/L (ref 38–126)
Anion gap: 8 (ref 5–15)
BUN: 17 mg/dL (ref 8–23)
CO2: 27 mmol/L (ref 22–32)
Calcium: 10.1 mg/dL (ref 8.9–10.3)
Chloride: 102 mmol/L (ref 98–111)
Creatinine, Ser: 1.35 mg/dL — ABNORMAL HIGH (ref 0.61–1.24)
GFR, Estimated: 60 mL/min — ABNORMAL LOW (ref >60)
Glucose, Bld: 90 mg/dL (ref 70–99)
Potassium: 3.9 mmol/L (ref 3.5–5.1)
Sodium: 137 mmol/L (ref 135–145)
Total Bilirubin: 1.3 mg/dL — ABNORMAL HIGH (ref 0.3–1.2)
Total Protein: 6.2 g/dL — ABNORMAL LOW (ref 6.5–8.1)

## 2021-06-09 LAB — TYPE AND SCREEN
ABO/RH(D): A POS
Antibody Screen: NEGATIVE

## 2021-06-09 LAB — URINALYSIS, ROUTINE W REFLEX MICROSCOPIC
Bilirubin Urine: NEGATIVE
Glucose, UA: NEGATIVE mg/dL
Hgb urine dipstick: NEGATIVE
Ketones, ur: NEGATIVE mg/dL
Leukocytes,Ua: NEGATIVE
Nitrite: NEGATIVE
Protein, ur: NEGATIVE mg/dL
Specific Gravity, Urine: 1.024 (ref 1.005–1.030)
pH: 5 (ref 5.0–8.0)

## 2021-06-09 LAB — CBC
HCT: 46.4 % (ref 39.0–52.0)
Hemoglobin: 15.6 g/dL (ref 13.0–17.0)
MCH: 32.2 pg (ref 26.0–34.0)
MCHC: 33.6 g/dL (ref 30.0–36.0)
MCV: 95.9 fL (ref 80.0–100.0)
Platelets: 193 10*3/uL (ref 150–400)
RBC: 4.84 MIL/uL (ref 4.22–5.81)
RDW: 13.3 % (ref 11.5–15.5)
WBC: 5.7 10*3/uL (ref 4.0–10.5)
nRBC: 0 % (ref 0.0–0.2)

## 2021-06-09 LAB — SURGICAL PCR SCREEN
MRSA, PCR: NEGATIVE
Staphylococcus aureus: NEGATIVE

## 2021-06-09 LAB — APTT: aPTT: 28 seconds (ref 24–36)

## 2021-06-09 NOTE — Pre-Procedure Instructions (Signed)
Surgical Instructions    Your procedure is scheduled on Wednesday 06/16/21.   Report to [facility] Main Entrance "A" at 06:30 A.M., then check in with the Admitting office.  Call this number if you have problems the morning of surgery:  [phone]   If you have any questions prior to your surgery date call [phone]: Open Monday-Friday 8am-4pm    Remember:  Do not eat after midnight the night before your surgery  You may drink clear liquids until 05:30 A.M. the morning of your surgery.   Clear liquids allowed are: Water, Non-Citrus Juices (without pulp), Carbonated Beverages, Clear Tea, Black Coffee ONLY (NO MILK, CREAM OR POWDERED CREAMER of any kind), and Gatorade    Take these medicines the morning of surgery with A SIP OF WATER   amLODipine (NORVASC)   atorvastatin (LIPITOR)  dorzolamide (TRUSOPT)   gabapentin (NEURONTIN)  pantoprazole (PROTONIX)   timolol (TIMOPTIC)   Take these medicines if needed:   acetaminophen (TYLENOL)  HYDROmorphone (DILAUDID)  As of today, STOP taking any Aspirin (unless otherwise instructed by your surgeon) Aleve, Naproxen, Ibuprofen, Motrin, Advil, Goody's, BC's, all herbal medications, fish oil, and all vitamins.   WHAT DO I DO ABOUT MY DIABETES MEDICATION?   Do not take oral diabetes medicines (pills) the morning of surgery.  The day of surgery, do not take other diabetes injectables, including Byetta (exenatide), Bydureon (exenatide ER), Victoza (liraglutide), or Trulicity (dulaglutide).  HOW TO MANAGE YOUR DIABETES BEFORE AND AFTER SURGERY  Why is it important to control my blood sugar before and after surgery? Improving blood sugar levels before and after surgery helps healing and can limit problems. A way of improving blood sugar control is eating a healthy diet by:  Eating less sugar and carbohydrates  Increasing activity/exercise  Talking with your doctor about reaching your blood sugar goals High blood sugars (greater than  180 mg/dL) can raise your risk of infections and slow your recovery, so you will need to focus on controlling your diabetes during the weeks before surgery. Make sure that the doctor who takes care of your diabetes knows about your planned surgery including the date and location.  How do I manage my blood sugar before surgery? Check your blood sugar at least 4 times a day, starting 2 days before surgery, to make sure that the level is not too high or low.  Check your blood sugar the morning of your surgery when you wake up and every 2 hours until you get to the Short Stay unit.  If your blood sugar is less than 70 mg/dL, you will need to treat for low blood sugar: Do not take insulin. Treat a low blood sugar (less than 70 mg/dL) with  cup of clear juice (cranberry or apple), 4 glucose tablets, OR glucose gel. Recheck blood sugar in 15 minutes after treatment (to make sure it is greater than 70 mg/dL). If your blood sugar is not greater than 70 mg/dL on recheck, call [phone] for further instructions. Report your blood sugar to the short stay nurse when you get to Short Stay.  If you are admitted to the hospital after surgery: Your blood sugar will be checked by the staff and you will probably be given insulin after surgery (instead of oral diabetes medicines) to make sure you have good blood sugar levels. The goal for blood sugar control after surgery is 80-180 mg/dL.                 DAY OF  SURGERY: Do not wear jewelry  Do not wear lotions, powders, colognes, or deodorant. Men may shave face and neck. Do not bring valuables to the hospital.             [facility] is not responsible for any belongings or valuables.  Do NOT Smoke (Tobacco/Vaping)  24 hours prior to your procedure  If you use a CPAP at night, you may bring your mask for your overnight stay.   Contacts, glasses, hearing aids, dentures or partials may not be worn into surgery, please bring cases for these  belongings   For patients admitted to the hospital, discharge time will be determined by your treatment team.   Patients discharged the day of surgery will not be allowed to drive home, and someone needs to stay with them for 24 hours.  NO VISITORS WILL BE ALLOWED IN PRE-OP WHERE PATIENTS ARE PREPPED FOR SURGERY.  ONLY 1 SUPPORT PERSON MAY BE PRESENT IN THE WAITING ROOM WHILE YOU ARE IN SURGERY.  IF YOU ARE TO BE ADMITTED, ONCE YOU ARE IN YOUR ROOM YOU WILL BE ALLOWED TWO (2) VISITORS. 1 (ONE) VISITOR MAY STAY OVERNIGHT BUT MUST ARRIVE TO THE ROOM BY 8pm.  Minor children may have two parents present. Special consideration for safety and communication needs will be reviewed on a case by case basis.  Special instructions:    Oral Hygiene is also important to reduce your risk of infection.  Remember - BRUSH YOUR TEETH THE MORNING OF SURGERY WITH YOUR REGULAR TOOTHPASTE   [place]- Preparing For Surgery  Before surgery, you can play an important role. Because skin is not sterile, your skin needs to be as free of germs as possible. You can reduce the number of germs on your skin by washing with CHG (chlorahexidine gluconate) Soap before surgery.  CHG is an antiseptic cleaner which kills germs and bonds with the skin to continue killing germs even after washing.     Please do not use if you have an allergy to CHG or antibacterial soaps. If your skin becomes reddened/irritated stop using the CHG.  Do not shave (including legs and underarms) for at least 48 hours prior to first CHG shower. It is OK to shave your face.  Please follow these instructions carefully.     Shower the NIGHT BEFORE SURGERY and the MORNING OF SURGERY with CHG Soap.   If you chose to wash your hair, wash your hair first as usual with your normal shampoo. After you shampoo, rinse your hair and body thoroughly to remove the shampoo.  Then [employer] and genitals (private parts) with your normal soap and rinse thoroughly to  remove soap.  After that Use CHG Soap as you would any other liquid soap. You can apply CHG directly to the skin and wash gently with a scrungie or a clean washcloth.   Apply the CHG Soap to your body ONLY FROM THE NECK DOWN.  Do not use on open wounds or open sores. Avoid contact with your eyes, ears, mouth and genitals (private parts). Wash Face and genitals (private parts)  with your normal soap.   Wash thoroughly, paying special attention to the area where your surgery will be performed.  Thoroughly rinse your body with warm water from the neck down.  DO NOT shower/wash with your normal soap after using and rinsing off the CHG Soap.  Pat yourself dry with a CLEAN TOWEL.  Wear CLEAN PAJAMAS to bed the night before surgery  Place CLEAN SHEETS on your bed the night before your surgery  DO NOT SLEEP WITH PETS.   Day of Surgery:  Take a shower with CHG soap. Wear Clean/Comfortable clothing the morning of surgery Do not apply any deodorants/lotions.   Remember to brush your teeth WITH YOUR REGULAR TOOTHPASTE.   Please read over the following fact sheets that you were given.

## 2021-06-09 NOTE — Progress Notes (Signed)
PCP - Cipriano Mile Cardiologist - Dr. Quay Burow Patient received cardiac clearance - see note from 05/07/21  Chest x-ray - n/a EKG - 05/07/21 Stress Test - 05/21/21 ECHO - 05/21/21  History of diabetes.  Patient states he is no longer diabetic and has not been diabetic in about 10 years.   ERAS Protcol - yes, no drink ordered or given  COVID TEST- scheduled for 06/14/21 (surgery admit)   Anesthesia review: yes, heart history, per order from Dr. [name] Received cardiac clearance from Dr. Gwenlyn Found on 05/07/21  Patient denies shortness of breath, fever, cough and chest pain at PAT appointment   All instructions explained to the patient, with a verbal understanding of the material. Patient agrees to go over the instructions while at home for a better understanding. Patient also instructed to self quarantine after being tested for COVID-19. The opportunity to ask questions was provided.

## 2021-06-10 ENCOUNTER — Encounter (HOSPITAL_COMMUNITY): Payer: Self-pay

## 2021-06-10 NOTE — Progress Notes (Addendum)
Anesthesia Chart Review:  Case: [id number] Date/Time: 06/16/21 0815   Procedures:      OBLIQUE LUMBAR INTERBODY FUSION 1 LEVEL WITH PERCUTANEOUS SCREWS(OLIF L4-5 WITH POSTERIOR SPINAL FUSION INTERBODY) - tap block with exparel 3 C-Bed     ABDOMINAL EXPOSURE   Anesthesia type: General   Pre-op diagnosis: Grade 2 slip with radiculopathy and degenerative disc disease L4-5   Location: [facility] OR ROOM 04 / [place] OR   Surgeons: Cory Schools, MD; Cory Heck, MD       DISCUSSION: Patient is a 60 year old male scheduled for the above procedure.  History includes never smoker, HTN, CAD (coronary calcifications 08/24/11 CT), bradycardia, afib/flutter (s/p ablation 09/12/16), HLD, CKD (Stage III), DM2, peripheral neuropathy, PVD (right great toe amputation 10/20/14, right 2nd toe amputation 12/25/14, right transmetatarsal amputation 12/20/19, right foot 5th ray amputation 01/22/20), Mantle cell Cancer (diagnosed 2012, s/p chemotherapy, stem cell transplant 05/30/11), splenectomy (for rupture in setting of Mantle cell lymphoma 01/30/11), Bell's Palsy (left 1980's), arthritis (left TKA 01/30/17, right reverse shoulder arthroplasty 01/31/19), polysubstance abuse (marijuana; previous cocaine).  Preoperative cardiology input outlined on 05/26/2021 by Cory Marks, Cylinder, "Given past medical history and time since last visit, based on ACC/AHA guidelines, Cory Marks would be at acceptable risk for the planned procedure without further cardiovascular testing.  Patient recently underwent a nuclear stress test which came back reassuring.  He has been cleared by Dr. Gwenlyn Marks to proceed with surgery..." 05/21/21 echo showed LVEF 45% with global LV hypokinesis, ascending aorta 44 mm with one year follow-up recommended. Nuclear stress test was non-ischemic with EF 58%.    Presurgical COVID-19 testing is scheduled for 06/14/2021. His appointment with vascular surgeon Cory Martinez, MD is on 06/14/21. Anesthesia team to evaluate on  the day of surgery. (UPDATE 06/15/21 4:37 PM: Cory Marks from Dr. [name]' office called and asked anesthesia to review 06/09/21 CXR in [facility] showing markedly elevated right hemidiaphragm new from 2003 with mild compressive atelectasis. Reportedly ordered by PCP for preoperative purposes. If no acute respiratory or infectious symptoms, then CXR report in and of itself should not preclude surgery. Further evaluation by surgical and anesthesia teams on the day of surgery. In the interim, ortho PA Cory Marks is attempting to get additional details from primary care.)   VS: BP 123/85   Pulse (!) 59   Temp 36.8 C (Oral)   Resp 18   Ht 6' (1.829 m)   Wt 95.4 kg   SpO2 97%   BMI 28.52 kg/m    PROVIDERS: Cory Mile, NP is PCP [facility]) - Cory Sorrow, MD is HEM-ONC. 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  No evidence of clinical disease.  On PCN VK for splenectomy history.  Advised to complete his splenectomy vaccinations, received first set by notes. One year follow-up.   Cory Burow, MD is cardiologist.  Seen on 05/07/2021 for preoperative evaluation.  He was ultimately cleared following stress and echo.  Repeat echo in 12 months recommended given EF 45% and dilated ascending aorta of 44 mm. - Marks, Will, MD is EP cardiologist. Last visit 01/16/17.   LABS: Labs reviewed: Acceptable for surgery. (all labs ordered are listed, but only abnormal results are displayed)  Labs Reviewed  COMPREHENSIVE METABOLIC PANEL - Abnormal; Notable for the following components:      Result Value   Creatinine, Ser 1.35 (*)    Total Protein 6.2 (*)    Total Bilirubin 1.3 (*)  GFR, Estimated 60 (*)    All other components within normal limits  SURGICAL PCR SCREEN  CBC  PROTIME-INR  APTT  URINALYSIS, ROUTINE W REFLEX MICROSCOPIC  TYPE AND SCREEN    IMAGES:  CXR 06/09/21 ([facility]): FINDINGS: Right hemidiaphragm is markedly elevated, a new  finding since previous exam. There is mild compressive atelectasis adjacent the right hemidiaphragm. Lungs are otherwise clear. There are no effusions or pneumothoraces. Heart size is probably normal. No evidence of adenopathy or acute bony pathology.. IMPRESSION: Elevated right hemidiaphragm with mild adjacent compressive atelectasis.    EKG: 05/07/21: Sinus bradycardia 56 bpm Low voltage QRS   CV: Nuclear stress test 05/21/21:   The study is normal. The study is low risk.   No ST deviation was noted.   Left ventricular function is normal. Nuclear stress EF: 58 %. The left ventricular ejection fraction is normal (55-65%). End diastolic cavity size is normal. End systolic cavity size is normal.   Prior study not available for comparison. Normal stress nuclear with apical thinning but no ischemia; EF 58 with normal wall motion.   Echo 05/21/21: IMPRESSIONS   1. Left ventricular ejection fraction, by estimation, is 45%. The left  ventricle has mildly decreased function. The left ventricle demonstrates  global hypokinesis. There is mild concentric left ventricular hypertrophy.  Left ventricular diastolic  parameters are consistent with Grade I diastolic dysfunction (impaired  relaxation).   2. Study done in sinus rhythm.   3. The aortic valve is tricuspid. There is mild thickening of the aortic  valve. Aortic valve regurgitation is mild and central in nature. No aortic  stenosis is present.   4. Aortic dilatation noted. There is mild dilatation of the ascending  aorta, measuring 44 mm.   5. Right ventricular systolic function is normal. The right ventricular  size is normal. There is normal pulmonary artery systolic pressure.   6. The mitral valve is normal in structure. No evidence of mitral valve  regurgitation. No evidence of mitral stenosis.  - Comparison(s): A prior study was performed on 10/18/2014. Similar  regurgitation; ascending aorta not well visualized in 2016 study.    Past Medical History:  Diagnosis Date   Arthritis    Bell's palsy    left eye droops a little   CAD (coronary artery disease)    Cancer ([place]) 2012   Mantle Cell Cancer- S/P stem cell transplant   CKD (chronic kidney disease) stage 3, GFR 30-59 ml/min ([place])    per lov with pcp,  last seen at [facility] january 2020 per patient, stable per patient    Diabetes mellitus without complication ([place])    hx of lost weight due to cancer not on medications since 2014    Dysrhythmia    atrial fib and atrial flutter, no repeat of dysrhymia since ablation in 2018 ,    Eye injury    left eye hit by a wood chip early june 2020, lov for f/u with opthamologist 6-17 , drained a pustule,  progress notes mention eye gradually improving with application of antibiotic  eye drops    History of chemotherapy last tx 2014   History of diabetes mellitus NONE SINCE 2012 CANCER   History of splenectomy 01/30/2011   Hyperlipemia    Hypertension    Neuromuscular disorder ([place])    peripheral neuropathy   Neuropathy    right leg due to football injury   Polysubstance abuse ([place])    Sinus bradycardia     Past  Surgical History:  Procedure Laterality Date   A-FLUTTER ABLATION N/A 09/12/2016   Procedure: A-Flutter Ablation;  Surgeon: Cory Meredith Leeds, MD;  Location: [place] [facility];  Service: Cardiovascular;  Laterality: N/A;   AMPUTATION Right 10/20/2014   Procedure: AMPUTATION RIGHT GREAT TOE;  Surgeon: Gaynelle Arabian, MD;  Location: [facility];  Service: Orthopedics;  Laterality: Right;   AMPUTATION Right 12/25/2014   Procedure: RIGHT 2ND TOE AMPUTATION ;  Surgeon: Wylene Simmer, MD;  Location: [place];  Service: Orthopedics;  Laterality: Right;   AMPUTATION Right 12/20/2019   Procedure: RIGHT FOOT AMPUTATION TOES;  Surgeon: Newt Minion, MD;  Location: [place];  Service: Orthopedics;  Laterality: Right;   AMPUTATION Right 01/22/2020   Procedure: RIGHT FOOT 5TH RAY AMPUTATION;   Surgeon: Newt Minion, MD;  Location: [place];  Service: Orthopedics;  Laterality: Right;   edentulation N/A 2019   prior to transplant   [place] Right 01/31/2019   Procedure: REVERSE SHOULDER ARTHROPLASTY;  Surgeon: Justice Britain, MD;  Location: [facility];  Service: Orthopedics;  Laterality: Right;   right knee surgery Right    nerve graft back of leg   SPLENECTOMY, TOTAL  01/30/2011   for splenic rupture in settin of mantle cell lymphoma   TENOLYSIS Right 12/25/2014   Procedure: RIGHT 3,4,5 FLEXOR TENDON RELEASE ;  Surgeon: Wylene Simmer, MD;  Location: [place];  Service: Orthopedics;  Laterality: Right;   TOTAL KNEE ARTHROPLASTY Left 01/30/2017   Procedure: LEFT TOTAL KNEE ARTHROPLASTY;  Surgeon: Gaynelle Arabian, MD;  Location: [facility];  Service: Orthopedics;  Laterality: Left;    MEDICATIONS:  acetaminophen (TYLENOL) 325 MG tablet   amLODipine (NORVASC) 10 MG tablet   atorvastatin (LIPITOR) 20 MG tablet   diclofenac Sodium (VOLTAREN) 1 % GEL   dorzolamide (TRUSOPT) 2 % ophthalmic solution   gabapentin (NEURONTIN) 300 MG capsule   HYDROmorphone (DILAUDID) 4 MG tablet   lisinopril (ZESTRIL) 5 MG tablet   Nutritional Supplements (IMMUNE ENHANCE) TABS   pantoprazole (PROTONIX) 40 MG tablet   penicillin v potassium (VEETID) 500 MG tablet   pyridOXINE (VITAMIN B-6) 100 MG tablet   timolol (TIMOPTIC) 0.5 % ophthalmic solution   No current facility-administered medications for this encounter.    Myra Gianotti, PA-C Surgical Short Stay/Anesthesiology [facility] Phone [phone] [facility] Phone [phone] 06/10/2021 4:15 PM

## 2021-06-10 NOTE — Anesthesia Preprocedure Evaluation (Addendum)
Anesthesia Evaluation  Patient identified by MRN, date of birth, ID band Patient awake    Reviewed: Allergy & Precautions, NPO status , Patient's Chart, lab work & pertinent test results, reviewed documented beta blocker date and time   Airway Mallampati: III  TM Distance: >3 FB Neck ROM: Full    Dental  (+) Edentulous Upper, Poor Dentition, Dental Advisory Given, Missing   Pulmonary pneumonia, resolved,    Pulmonary exam normal breath sounds clear to auscultation       Cardiovascular hypertension, Pt. on medications + CAD and +CHF  Normal cardiovascular exam+ dysrhythmias Atrial Fibrillation  Rhythm:Regular Rate:Normal  Nuclear stress test 05/21/21: . The study is normal. The study is low risk. . No ST deviation was noted. . Left ventricular function is normal. Nuclear stress EF: 58 %. The left ventricular ejection fraction is normal (55-65%). End diastolic cavity size is normal. End systolic cavity size is normal. . Prior study not available for comparison. Normal stress nuclear with apical thinning but no ischemia; EF 58 with normal wall motion.   Echo 05/21/21: IMPRESSIONS  1. Left ventricular ejection fraction, by estimation, is 45%. The left ventricle has mildly decreased function. The left ventricle demonstrates global hypokinesis. There is mild concentric left ventricular hypertrophy.  Left ventricular diastolic parameters are consistent with Grade I diastolic dysfunction (impaired relaxation).  2. Study done in sinus rhythm.  3. The aortic valve is tricuspid. There is mild thickening of the aortic valve. Aortic valve regurgitation is mild and central in nature. No aortic stenosis is present.  4. Aortic dilatation noted. There is mild dilatation of the ascending aorta, measuring 44 mm.  5. Right ventricular systolic function is normal. The right ventricular size is normal. There is normal pulmonary artery systolic  pressure.  6. The mitral valve is normal in structure. No evidence of mitral valve regurgitation. No evidence of mitral stenosis.  - Comparison(s): A prior study was performed on 10/18/2014. Similar regurgitation; ascending aorta not well visualized in 2016 study.   Hx/o Atrial fib/flutter S/P ablation 2018   Neuro/Psych Peripheral neuropathy  Glaucoma OS- severe S/P trauma in 2020  Neuromuscular disease negative psych ROS   GI/Hepatic negative GI ROS,   Endo/Other  diabetes, Well Controlled, Type 2  Renal/GU Renal InsufficiencyRenal disease  negative genitourinary   Musculoskeletal  (+) Arthritis , Osteoarthritis,  Grade 2 SLIP radiculopathy, DDD L4-5   Abdominal   Peds  Hematology Hx/o mantle cell lymphoma S/P Stem cell transplant   Anesthesia Other Findings   Reproductive/Obstetrics ED                        Anesthesia Physical Anesthesia Plan  ASA: 3  Anesthesia Plan: General   Post-op Pain Management:  Regional for Post-op pain   Induction: Intravenous  PONV Risk Score and Plan: 4 or greater and Treatment may vary due to age or medical condition and Ondansetron  Airway Management Planned: Oral ETT  Additional Equipment:   Intra-op Plan:   Post-operative Plan: Extubation in OR  Informed Consent: I have reviewed the patients History and Physical, chart, labs and discussed the procedure including the risks, benefits and alternatives for the proposed anesthesia with the patient or authorized representative who has indicated his/her understanding and acceptance.     Dental advisory given  Plan Discussed with: CRNA and Anesthesiologist  Anesthesia Plan Comments: (PAT note written 06/10/2021 by Myra Gianotti, PA-C. )      Anesthesia Quick Evaluation

## 2021-06-14 ENCOUNTER — Other Ambulatory Visit (HOSPITAL_COMMUNITY): Admission: RE | Admit: 2021-06-14 | Payer: Medicare (Managed Care) | Source: Ambulatory Visit

## 2021-06-15 ENCOUNTER — Other Ambulatory Visit: Payer: Self-pay

## 2021-06-15 ENCOUNTER — Encounter: Payer: Self-pay | Admitting: Vascular Surgery

## 2021-06-15 ENCOUNTER — Encounter: Payer: Medicare (Managed Care) | Admitting: Vascular Surgery

## 2021-06-15 ENCOUNTER — Ambulatory Visit: Payer: Medicare (Managed Care) | Admitting: Vascular Surgery

## 2021-06-15 ENCOUNTER — Encounter (HOSPITAL_COMMUNITY): Payer: Self-pay | Admitting: Orthopedic Surgery

## 2021-06-15 VITALS — BP 122/82 | HR 50 | Temp 97.6°F | Resp 18 | Ht 72.0 in | Wt 205.0 lb

## 2021-06-15 DIAGNOSIS — M5416 Radiculopathy, lumbar region: Secondary | ICD-10-CM

## 2021-06-15 DIAGNOSIS — M4316 Spondylolisthesis, lumbar region: Secondary | ICD-10-CM | POA: Diagnosis not present

## 2021-06-15 DIAGNOSIS — M48062 Spinal stenosis, lumbar region with neurogenic claudication: Secondary | ICD-10-CM

## 2021-06-15 DIAGNOSIS — M48061 Spinal stenosis, lumbar region without neurogenic claudication: Secondary | ICD-10-CM

## 2021-06-15 NOTE — Progress Notes (Signed)
Patient name: Cory Marks MRN: [id number] DOB: 12/04/1960 Sex: male  REASON FOR CONSULT: Evaluate for L4-L5 OLIF  HPI: Cory Marks is a 60 y.o. male, with history of chronic kidney disease, hypertension, hyperlipidemia, diabetes, coronary artery disease, mantle cell lymphoma with history of splenectomy that presents for evaluation of L4-L5 OLIF.  Patient has had chronic severe lower back pain with left leg radiculopathy for greater than 40 years.  He has failed conservative management.  He has been evaluated by Dr. [name] who has recommended an OLIF at L4-L5.  Vascular surgery was asked to assist with abdominal exposure.  His only abdominal surgery is a splenectomy through a midline laparotomy.  He is here walking with a walker.  Past Medical History:  Diagnosis Date   Arthritis    Bell's palsy    left eye droops a little   CAD (coronary artery disease)    Cancer ([place]) 2012   Mantle Cell Cancer- S/P stem cell transplant   CKD (chronic kidney disease) stage 3, GFR 30-59 ml/min ([place])    per lov with pcp,  last seen at [facility] january 2020 per patient, stable per patient    Diabetes mellitus without complication ([place])    hx of lost weight due to cancer not on medications since 2014    Dysrhythmia    atrial fib and atrial flutter, no repeat of dysrhymia since ablation in 2018 ,    Eye injury    left eye hit by a wood chip early june 2020, lov for f/u with opthamologist 6-17 , drained a pustule,  progress notes mention eye gradually improving with application of antibiotic  eye drops    History of chemotherapy last tx 2014   History of diabetes mellitus NONE SINCE 2012 CANCER   History of splenectomy 01/30/2011   Hyperlipemia    Hypertension    Neuromuscular disorder ([place])    peripheral neuropathy   Neuropathy    right leg due to football injury   Polysubstance abuse ([place])    Sinus bradycardia     Past Surgical History:  Procedure Laterality Date   A-FLUTTER  ABLATION N/A 09/12/2016   Procedure: A-Flutter Ablation;  Surgeon: Will Meredith Leeds, MD;  Location: [facility];  Service: Cardiovascular;  Laterality: N/A;   AMPUTATION Right 10/20/2014   Procedure: AMPUTATION RIGHT GREAT TOE;  Surgeon: Gaynelle Arabian, MD;  Location: [facility];  Service: Orthopedics;  Laterality: Right;   AMPUTATION Right 12/25/2014   Procedure: RIGHT 2ND TOE AMPUTATION ;  Surgeon: Wylene Simmer, MD;  Location: [facility];  Service: Orthopedics;  Laterality: Right;   AMPUTATION Right 12/20/2019   Procedure: RIGHT FOOT AMPUTATION TOES;  Surgeon: Newt Minion, MD;  Location: [facility];  Service: Orthopedics;  Laterality: Right;   AMPUTATION Right 01/22/2020   Procedure: RIGHT FOOT 5TH RAY AMPUTATION;  Surgeon: Newt Minion, MD;  Location: [facility];  Service: Orthopedics;  Laterality: Right;   edentulation N/A 2019   prior to transplant   [place] Right 01/31/2019   Procedure: REVERSE SHOULDER ARTHROPLASTY;  Surgeon: Justice Britain, MD;  Location: [facility];  Service: Orthopedics;  Laterality: Right;   right knee surgery Right    nerve graft back of leg   SPLENECTOMY, TOTAL  01/30/2011   for splenic rupture in settin of mantle cell lymphoma   TENOLYSIS Right 12/25/2014   Procedure: RIGHT 3,4,5 FLEXOR TENDON RELEASE ;  Surgeon: Wylene Simmer, MD;  Location: [facility];  Service: Orthopedics;  Laterality: Right;   TOTAL KNEE ARTHROPLASTY Left 01/30/2017   Procedure: LEFT TOTAL KNEE ARTHROPLASTY;  Surgeon: Gaynelle Arabian, MD;  Location: [facility];  Service: Orthopedics;  Laterality: Left;    Family History  Problem Relation Age of Onset   Hypertension Mother    Diabetes Mother    Cancer Father        pancreatic cancer   Diabetes Father    Heart disease Father    Diabetes Sister     SOCIAL HISTORY: Social History   Socioeconomic History   Marital status: Married    Spouse name: Not on file    Number of children: Not on file   Years of education: Not on file   Highest education level: Not on file  Occupational History   Not on file  Tobacco Use   Smoking status: Never   Smokeless tobacco: Never  Vaping Use   Vaping Use: Never used  Substance and Sexual Activity   Alcohol use: Yes    Alcohol/week: 6.0 standard drinks    Types: 6 Cans of beer per week    Comment: occasional    Drug use: Yes    Frequency: 3.0 times per week    Types: Marijuana   Sexual activity: Yes    Partners: Female  Other Topics Concern   Not on file  Social History Narrative   Not on file   Social Determinants of Health   Financial Resource Strain: Not on file  Food Insecurity: Not on file  Transportation Needs: Not on file  Physical Activity: Not on file  Stress: Not on file  Social Connections: Not on file  Intimate Partner Violence: Not on file    Allergies  Allergen Reactions   Oxycodone Itching and Palpitations    Current Outpatient Medications  Medication Sig Dispense Refill   acetaminophen (TYLENOL) 325 MG tablet Take 650-975 mg by mouth every 6 (six) hours as needed for mild pain or headache.     amLODipine (NORVASC) 10 MG tablet Take 1 tablet (10 mg total) by mouth every morning. (Patient taking differently: Take 10 mg by mouth daily.) 90 tablet 1   atorvastatin (LIPITOR) 20 MG tablet Take 20 mg by mouth daily.     diclofenac Sodium (VOLTAREN) 1 % GEL Apply 2 g topically daily as needed (pain).     dorzolamide (TRUSOPT) 2 % ophthalmic solution Place 1 drop into the left eye 2 (two) times daily.     gabapentin (NEURONTIN) 300 MG capsule Take 300-600 mg by mouth See admin instructions. Taking 1 capsule (300mg ) in the Am & PM and 2 capsules ( 600mg ) at bedtime     HYDROmorphone (DILAUDID) 4 MG tablet Take 4 mg by mouth 2 (two) times daily as needed for moderate pain.     lisinopril (ZESTRIL) 5 MG tablet Take 5 mg by mouth daily.      Nutritional Supplements (IMMUNE ENHANCE) TABS  Take 1 tablet by mouth daily.     pantoprazole (PROTONIX) 40 MG tablet Take 40 mg by mouth daily.     penicillin v potassium (VEETID) 500 MG tablet Take 500 mg by mouth 2 (two) times daily.     pyridOXINE (VITAMIN B-6) 100 MG tablet Take 100 mg by mouth daily.     timolol (TIMOPTIC) 0.5 % ophthalmic solution Place 1 drop into the left eye 2 (two) times daily.      No current  facility-administered medications for this visit.    REVIEW OF SYSTEMS:  [X]  denotes positive finding, [ ]  denotes negative finding Cardiac  Comments:  Chest pain or chest pressure:    Shortness of breath upon exertion:    Short of breath when lying flat:    Irregular heart rhythm:        Vascular    Pain in calf, thigh, or hip brought on by ambulation:    Pain in feet at night that wakes you up from your sleep:  x   Blood clot in your veins:    Leg swelling:         Pulmonary    Oxygen at home:    Productive cough:     Wheezing:         Neurologic    Sudden weakness in arms or legs:     Sudden numbness in arms or legs:     Sudden onset of difficulty speaking or slurred speech:    Temporary loss of vision in one eye:     Problems with dizziness:         Gastrointestinal    Blood in stool:     Vomited blood:         Genitourinary    Burning when urinating:     Blood in urine:        Psychiatric    Major depression:         Hematologic    Bleeding problems:    Problems with blood clotting too easily:        Skin    Rashes or ulcers:        Constitutional    Fever or chills:      PHYSICAL EXAM: Vitals:   06/15/21 0820  BP: 122/82  Pulse: (!) 50  Resp: 18  Temp: 97.6 F (36.4 C)  TempSrc: Temporal  SpO2: 94%  Weight: 205 lb (93 kg)  Height: 6' (1.829 m)    GENERAL: The patient is a well-nourished male, in no acute distress. The vital signs are documented above. CARDIAC: There is a regular rate and rhythm.  VASCULAR:  Palpable femoral pulses bilaterally Right foot toes  amputated with palpable PT Left DP palpable PULMONARY: No respiratory distress. ABDOMEN: Soft and non-tender.  Midline laparotomy well-healed MUSCULOSKELETAL: There are no major deformities or cyanosis. NEUROLOGIC: No focal weakness or paresthesias are detected. SKIN: There are no ulcers or rashes noted. PSYCHIATRIC: The patient has a normal affect.     DATA:   MRI reviewed as pictured above  Assessment/Plan:  60 year old male with multiple comorbidities that presents with chronic lower back pain with left leg radiculopathy.  He has L4-L5 degenerative spondylolisthesis with severe stenosis.  Vascular surgery was asked to evaluate for exposure at L4-L5 for L4-L5 OLIF.  After review of the MRI, I think he would be a good candidate for OLIF.  He has had a previous midline laparotomy for splenectomy but I think we can stay away from this by doing an oblique exposure.  I discussed he would be in the right lateral position with an incision over the left oblique muscles at the L4-L5 disc space.  After performing muscle-sparing technique would enter the retroperitoneum to move the peritoneum and ureter to get to the L4-5 disc space and much more about moving the left psoas.  Some risk of vessel injury as well.  All questions answered.  Risk and benefits discussed.  Look forward to assisting Dr. [name] tomorrow.  Marty Heck, MD [facility] Office: [phone]

## 2021-06-16 ENCOUNTER — Encounter (HOSPITAL_COMMUNITY)
Admission: RE | Disposition: A | Discharge status: Home / Self Care | Payer: Self-pay | Source: Home / Self Care | Attending: Orthopedic Surgery

## 2021-06-16 ENCOUNTER — Inpatient Hospital Stay (HOSPITAL_COMMUNITY): Payer: Medicare (Managed Care) | Admitting: Certified Registered Nurse Anesthetist

## 2021-06-16 ENCOUNTER — Encounter (HOSPITAL_COMMUNITY): Payer: Self-pay | Admitting: Orthopedic Surgery

## 2021-06-16 ENCOUNTER — Inpatient Hospital Stay (HOSPITAL_COMMUNITY): Payer: Medicare (Managed Care)

## 2021-06-16 ENCOUNTER — Other Ambulatory Visit: Payer: Self-pay

## 2021-06-16 ENCOUNTER — Inpatient Hospital Stay (HOSPITAL_COMMUNITY): Payer: Medicare (Managed Care) | Admitting: Vascular Surgery

## 2021-06-16 ENCOUNTER — Inpatient Hospital Stay (HOSPITAL_COMMUNITY)
Admission: RE | Admit: 2021-06-16 | Discharge: 2021-06-17 | DRG: 454 | Disposition: A | Discharge status: Home / Self Care | Payer: Medicare (Managed Care) | Attending: Orthopedic Surgery | Admitting: Orthopedic Surgery

## 2021-06-16 DIAGNOSIS — I4891 Unspecified atrial fibrillation: Secondary | ICD-10-CM | POA: Diagnosis present

## 2021-06-16 DIAGNOSIS — Z89411 Acquired absence of right great toe: Secondary | ICD-10-CM

## 2021-06-16 DIAGNOSIS — Z79899 Other long term (current) drug therapy: Secondary | ICD-10-CM | POA: Diagnosis not present

## 2021-06-16 DIAGNOSIS — Z885 Allergy status to narcotic agent status: Secondary | ICD-10-CM | POA: Diagnosis not present

## 2021-06-16 DIAGNOSIS — K08109 Complete loss of teeth, unspecified cause, unspecified class: Secondary | ICD-10-CM | POA: Diagnosis present

## 2021-06-16 DIAGNOSIS — Z833 Family history of diabetes mellitus: Secondary | ICD-10-CM | POA: Diagnosis not present

## 2021-06-16 DIAGNOSIS — M48061 Spinal stenosis, lumbar region without neurogenic claudication: Principal | ICD-10-CM | POA: Diagnosis present

## 2021-06-16 DIAGNOSIS — I2510 Atherosclerotic heart disease of native coronary artery without angina pectoris: Secondary | ICD-10-CM | POA: Diagnosis present

## 2021-06-16 DIAGNOSIS — Z8572 Personal history of non-Hodgkin lymphomas: Secondary | ICD-10-CM | POA: Diagnosis not present

## 2021-06-16 DIAGNOSIS — N183 Chronic kidney disease, stage 3 unspecified: Secondary | ICD-10-CM | POA: Diagnosis present

## 2021-06-16 DIAGNOSIS — Z9081 Acquired absence of spleen: Secondary | ICD-10-CM | POA: Diagnosis not present

## 2021-06-16 DIAGNOSIS — M4316 Spondylolisthesis, lumbar region: Secondary | ICD-10-CM | POA: Diagnosis present

## 2021-06-16 DIAGNOSIS — I5022 Chronic systolic (congestive) heart failure: Secondary | ICD-10-CM | POA: Diagnosis present

## 2021-06-16 DIAGNOSIS — G8929 Other chronic pain: Secondary | ICD-10-CM

## 2021-06-16 DIAGNOSIS — Z8249 Family history of ischemic heart disease and other diseases of the circulatory system: Secondary | ICD-10-CM

## 2021-06-16 DIAGNOSIS — Z20822 Contact with and (suspected) exposure to covid-19: Secondary | ICD-10-CM | POA: Diagnosis present

## 2021-06-16 DIAGNOSIS — M5116 Intervertebral disc disorders with radiculopathy, lumbar region: Secondary | ICD-10-CM | POA: Diagnosis present

## 2021-06-16 DIAGNOSIS — E1142 Type 2 diabetes mellitus with diabetic polyneuropathy: Secondary | ICD-10-CM | POA: Diagnosis present

## 2021-06-16 DIAGNOSIS — Z419 Encounter for procedure for purposes other than remedying health state, unspecified: Secondary | ICD-10-CM

## 2021-06-16 DIAGNOSIS — I130 Hypertensive heart and chronic kidney disease with heart failure and stage 1 through stage 4 chronic kidney disease, or unspecified chronic kidney disease: Secondary | ICD-10-CM | POA: Diagnosis present

## 2021-06-16 DIAGNOSIS — E1122 Type 2 diabetes mellitus with diabetic chronic kidney disease: Secondary | ICD-10-CM | POA: Diagnosis present

## 2021-06-16 DIAGNOSIS — Z9221 Personal history of antineoplastic chemotherapy: Secondary | ICD-10-CM | POA: Diagnosis not present

## 2021-06-16 DIAGNOSIS — M1990 Unspecified osteoarthritis, unspecified site: Secondary | ICD-10-CM | POA: Diagnosis present

## 2021-06-16 DIAGNOSIS — Z981 Arthrodesis status: Secondary | ICD-10-CM

## 2021-06-16 DIAGNOSIS — M48062 Spinal stenosis, lumbar region with neurogenic claudication: Secondary | ICD-10-CM

## 2021-06-16 DIAGNOSIS — E785 Hyperlipidemia, unspecified: Secondary | ICD-10-CM | POA: Diagnosis present

## 2021-06-16 HISTORY — PX: ABDOMINAL EXPOSURE: SHX5708

## 2021-06-16 HISTORY — PX: OBLIQUE LUMBAR INTERBODY FUSION 1 LEVEL WITH PERCUTANEOUS SCREWS: SHX6890

## 2021-06-16 LAB — GLUCOSE, CAPILLARY
Glucose-Capillary: 78 mg/dL (ref 70–99)
Glucose-Capillary: 98 mg/dL (ref 70–99)
Glucose-Capillary: 140 mg/dL — ABNORMAL HIGH (ref 70–99)
Glucose-Capillary: 140 mg/dL — ABNORMAL HIGH (ref 70–99)

## 2021-06-16 LAB — SARS CORONAVIRUS 2 BY RT PCR (HOSPITAL ORDER, PERFORMED IN ~~LOC~~ HOSPITAL LAB): SARS Coronavirus 2: NEGATIVE

## 2021-06-16 SURGERY — OBLIQUE LUMBAR INTERBODY FUSION 1 LEVEL WITH PERCUTANEOUS SCREWS
Anesthesia: General | Site: Spine Lumbar

## 2021-06-16 MED ORDER — THROMBIN 20000 UNITS EX KIT
PACK | CUTANEOUS | Status: DC | PRN
  Administered 2021-06-16: 20 mL via TOPICAL

## 2021-06-16 MED ORDER — CEFAZOLIN SODIUM-DEXTROSE 1-4 GM/50ML-% IV SOLN
1.0000 g | Freq: Three times a day (TID) | INTRAVENOUS | Status: AC
  Administered 2021-06-16 (×2): 1 g via INTRAVENOUS
  Filled 2021-06-16 (×2): qty 50

## 2021-06-16 MED ORDER — MIDAZOLAM HCL 2 MG/2ML IJ SOLN
INTRAMUSCULAR | Status: AC
  Filled 2021-06-16: qty 2

## 2021-06-16 MED ORDER — DORZOLAMIDE HCL 2 % OP SOLN
1.0000 [drp] | Freq: Two times a day (BID) | OPHTHALMIC | Status: DC
  Administered 2021-06-16 – 2021-06-17 (×2): 1 [drp] via OPHTHALMIC
  Filled 2021-06-16 (×2): qty 10

## 2021-06-16 MED ORDER — LACTATED RINGERS IV SOLN: INTRAVENOUS | Status: DC

## 2021-06-16 MED ORDER — SUGAMMADEX SODIUM 200 MG/2ML IV SOLN
INTRAVENOUS | Status: DC | PRN
Start: 2021-06-16 — End: 2021-06-16
  Administered 2021-06-16: 200 mg via INTRAVENOUS

## 2021-06-16 MED ORDER — SODIUM CHLORIDE 0.9% FLUSH: 3.0000 mL | INTRAVENOUS | Status: DC | PRN

## 2021-06-16 MED ORDER — VANCOMYCIN HCL IN DEXTROSE 1-5 GM/200ML-% IV SOLN
1000.0000 mg | INTRAVENOUS | Status: AC
  Administered 2021-06-16: 1000 mg via INTRAVENOUS
  Filled 2021-06-16: qty 200

## 2021-06-16 MED ORDER — METHOCARBAMOL 500 MG PO TABS: 500.0000 mg | ORAL_TABLET | Freq: Three times a day (TID) | ORAL | 0 refills | Status: AC | PRN

## 2021-06-16 MED ORDER — BUPIVACAINE LIPOSOME 1.3 % IJ SUSP
INTRAMUSCULAR | Status: DC | PRN
  Administered 2021-06-16: 10 mL via PERINEURAL

## 2021-06-16 MED ORDER — SODIUM CHLORIDE 0.9% FLUSH
3.0000 mL | Freq: Two times a day (BID) | INTRAVENOUS | Status: DC
  Administered 2021-06-16 – 2021-06-17 (×3): 3 mL via INTRAVENOUS

## 2021-06-16 MED ORDER — CHLORHEXIDINE GLUCONATE CLOTH 2 % EX PADS: 6.0000 | MEDICATED_PAD | Freq: Once | CUTANEOUS | Status: DC

## 2021-06-16 MED ORDER — AMLODIPINE BESYLATE 5 MG PO TABS
10.0000 mg | ORAL_TABLET | Freq: Every day | ORAL | Status: DC
  Administered 2021-06-17: 10 mg via ORAL
  Filled 2021-06-16: qty 2

## 2021-06-16 MED ORDER — METHOCARBAMOL 1000 MG/10ML IJ SOLN
500.0000 mg | Freq: Four times a day (QID) | INTRAVENOUS | Status: DC | PRN
  Filled 2021-06-16: qty 5

## 2021-06-16 MED ORDER — DROPERIDOL 2.5 MG/ML IJ SOLN: 0.6250 mg | Freq: Once | INTRAMUSCULAR | Status: DC | PRN

## 2021-06-16 MED ORDER — ONDANSETRON HCL 4 MG PO TABS: 4.0000 mg | ORAL_TABLET | Freq: Three times a day (TID) | ORAL | 0 refills | Status: DC | PRN

## 2021-06-16 MED ORDER — BUPIVACAINE LIPOSOME 1.3 % IJ SUSP: INTRAMUSCULAR | Status: DC | PRN

## 2021-06-16 MED ORDER — 0.9 % SODIUM CHLORIDE (POUR BTL) OPTIME
TOPICAL | Status: DC | PRN
  Administered 2021-06-16: 1000 mL

## 2021-06-16 MED ORDER — DEXAMETHASONE SODIUM PHOSPHATE 10 MG/ML IJ SOLN
INTRAMUSCULAR | Status: AC
  Filled 2021-06-16: qty 1

## 2021-06-16 MED ORDER — THROMBIN 20000 UNITS EX SOLR
CUTANEOUS | Status: AC
  Filled 2021-06-16: qty 20000

## 2021-06-16 MED ORDER — PHENOL 1.4 % MT LIQD: 1.0000 | OROMUCOSAL | Status: DC | PRN

## 2021-06-16 MED ORDER — HYDROCODONE-ACETAMINOPHEN 10-325 MG PO TABS
2.0000 | ORAL_TABLET | ORAL | Status: DC | PRN
  Administered 2021-06-16 – 2021-06-17 (×4): 2 via ORAL
  Filled 2021-06-16 (×4): qty 2

## 2021-06-16 MED ORDER — CHLORHEXIDINE GLUCONATE 0.12 % MT SOLN
15.0000 mL | Freq: Once | OROMUCOSAL | Status: AC
  Administered 2021-06-16: 15 mL via OROMUCOSAL
  Filled 2021-06-16: qty 15

## 2021-06-16 MED ORDER — LISINOPRIL 10 MG PO TABS
5.0000 mg | ORAL_TABLET | Freq: Every day | ORAL | Status: DC
  Administered 2021-06-16 – 2021-06-17 (×2): 5 mg via ORAL
  Filled 2021-06-16 (×2): qty 1

## 2021-06-16 MED ORDER — PROPOFOL 10 MG/ML IV BOLUS
INTRAVENOUS | Status: AC
  Filled 2021-06-16: qty 20

## 2021-06-16 MED ORDER — HYDROMORPHONE HCL 1 MG/ML IJ SOLN
1.0000 mg | INTRAMUSCULAR | Status: AC | PRN
  Administered 2021-06-16: 1 mg via INTRAVENOUS
  Filled 2021-06-16: qty 1

## 2021-06-16 MED ORDER — SUCCINYLCHOLINE CHLORIDE 200 MG/10ML IV SOSY
PREFILLED_SYRINGE | INTRAVENOUS | Status: AC
  Filled 2021-06-16: qty 10

## 2021-06-16 MED ORDER — FENTANYL CITRATE (PF) 250 MCG/5ML IJ SOLN
INTRAMUSCULAR | Status: DC | PRN
  Administered 2021-06-16: 50 ug via INTRAVENOUS
  Administered 2021-06-16: 100 ug via INTRAVENOUS
  Administered 2021-06-16: 50 ug via INTRAVENOUS

## 2021-06-16 MED ORDER — PANTOPRAZOLE SODIUM 40 MG PO TBEC
40.0000 mg | DELAYED_RELEASE_TABLET | Freq: Every day | ORAL | Status: DC | PRN
  Administered 2021-06-17: 40 mg via ORAL
  Filled 2021-06-16: qty 1

## 2021-06-16 MED ORDER — HYDROMORPHONE HCL 1 MG/ML IJ SOLN
INTRAMUSCULAR | Status: AC
  Filled 2021-06-16: qty 1

## 2021-06-16 MED ORDER — FENTANYL CITRATE (PF) 250 MCG/5ML IJ SOLN
INTRAMUSCULAR | Status: AC
  Filled 2021-06-16: qty 5

## 2021-06-16 MED ORDER — EPHEDRINE 5 MG/ML INJ
INTRAVENOUS | Status: AC
  Filled 2021-06-16: qty 5

## 2021-06-16 MED ORDER — ATORVASTATIN CALCIUM 10 MG PO TABS
20.0000 mg | ORAL_TABLET | Freq: Every day | ORAL | Status: DC
  Administered 2021-06-17: 20 mg via ORAL
  Filled 2021-06-16: qty 2

## 2021-06-16 MED ORDER — OXYCODONE-ACETAMINOPHEN 10-325 MG PO TABS: 1.0000 | ORAL_TABLET | Freq: Four times a day (QID) | ORAL | 0 refills | Status: AC | PRN

## 2021-06-16 MED ORDER — HYDROMORPHONE HCL 1 MG/ML IJ SOLN
0.2500 mg | INTRAMUSCULAR | Status: DC | PRN
  Administered 2021-06-16: 0.25 mg via INTRAVENOUS

## 2021-06-16 MED ORDER — POLYETHYLENE GLYCOL 3350 17 G PO PACK
17.0000 g | PACK | Freq: Every day | ORAL | Status: DC | PRN
  Administered 2021-06-16: 17 g via ORAL
  Filled 2021-06-16: qty 1

## 2021-06-16 MED ORDER — METHOCARBAMOL 500 MG PO TABS
500.0000 mg | ORAL_TABLET | Freq: Four times a day (QID) | ORAL | Status: DC | PRN
  Administered 2021-06-16 – 2021-06-17 (×2): 500 mg via ORAL
  Filled 2021-06-16 (×2): qty 1

## 2021-06-16 MED ORDER — ONDANSETRON HCL 4 MG/2ML IJ SOLN: 4.0000 mg | Freq: Four times a day (QID) | INTRAMUSCULAR | Status: DC | PRN

## 2021-06-16 MED ORDER — ACETAMINOPHEN 650 MG RE SUPP: 650.0000 mg | RECTAL | Status: DC | PRN

## 2021-06-16 MED ORDER — SODIUM CHLORIDE 0.9 % IV SOLN: 250.0000 mL | INTRAVENOUS | Status: DC

## 2021-06-16 MED ORDER — FLEET ENEMA 7-19 GM/118ML RE ENEM: 1.0000 | ENEMA | Freq: Once | RECTAL | Status: DC | PRN

## 2021-06-16 MED ORDER — PHENYLEPHRINE HCL-NACL 20-0.9 MG/250ML-% IV SOLN
INTRAVENOUS | Status: DC | PRN
  Administered 2021-06-16: 50 ug/min via INTRAVENOUS

## 2021-06-16 MED ORDER — LIDOCAINE 2% (20 MG/ML) 5 ML SYRINGE
INTRAMUSCULAR | Status: AC
  Filled 2021-06-16: qty 5

## 2021-06-16 MED ORDER — PROPOFOL 500 MG/50ML IV EMUL
INTRAVENOUS | Status: DC | PRN
  Administered 2021-06-16: 75 ug/kg/min via INTRAVENOUS

## 2021-06-16 MED ORDER — DEXAMETHASONE SODIUM PHOSPHATE 10 MG/ML IJ SOLN
INTRAMUSCULAR | Status: DC | PRN
  Administered 2021-06-16: 10 mg via INTRAVENOUS

## 2021-06-16 MED ORDER — GLYCOPYRROLATE PF 0.2 MG/ML IJ SOSY
PREFILLED_SYRINGE | INTRAMUSCULAR | Status: AC
  Filled 2021-06-16: qty 1

## 2021-06-16 MED ORDER — MENTHOL 3 MG MT LOZG: 1.0000 | LOZENGE | OROMUCOSAL | Status: DC | PRN

## 2021-06-16 MED ORDER — HYDROCODONE-ACETAMINOPHEN 10-325 MG PO TABS: 1.0000 | ORAL_TABLET | ORAL | Status: DC | PRN

## 2021-06-16 MED ORDER — ONDANSETRON HCL 4 MG PO TABS: 4.0000 mg | ORAL_TABLET | Freq: Four times a day (QID) | ORAL | Status: DC | PRN

## 2021-06-16 MED ORDER — TIMOLOL MALEATE 0.5 % OP SOLN
1.0000 [drp] | Freq: Two times a day (BID) | OPHTHALMIC | Status: DC
  Administered 2021-06-16 – 2021-06-17 (×2): 1 [drp] via OPHTHALMIC
  Filled 2021-06-16 (×2): qty 5

## 2021-06-16 MED ORDER — LIDOCAINE 2% (20 MG/ML) 5 ML SYRINGE
INTRAMUSCULAR | Status: DC | PRN
  Administered 2021-06-16: 60 mg via INTRAVENOUS

## 2021-06-16 MED ORDER — ACETAMINOPHEN 325 MG PO TABS: 650.0000 mg | ORAL_TABLET | ORAL | Status: DC | PRN

## 2021-06-16 MED ORDER — ONDANSETRON HCL 4 MG/2ML IJ SOLN
INTRAMUSCULAR | Status: DC | PRN
  Administered 2021-06-16: 4 mg via INTRAVENOUS

## 2021-06-16 MED ORDER — GLYCOPYRROLATE 0.2 MG/ML IJ SOLN
INTRAMUSCULAR | Status: DC | PRN
  Administered 2021-06-16 (×2): .2 mg via INTRAVENOUS

## 2021-06-16 MED ORDER — EPHEDRINE SULFATE-NACL 50-0.9 MG/10ML-% IV SOSY
PREFILLED_SYRINGE | INTRAVENOUS | Status: DC | PRN
  Administered 2021-06-16: 10 mg via INTRAVENOUS
  Administered 2021-06-16: 5 mg via INTRAVENOUS
  Administered 2021-06-16: 10 mg via INTRAVENOUS

## 2021-06-16 MED ORDER — ROCURONIUM BROMIDE 10 MG/ML (PF) SYRINGE
PREFILLED_SYRINGE | INTRAVENOUS | Status: AC
  Filled 2021-06-16: qty 10

## 2021-06-16 MED ORDER — SURGIFLO WITH THROMBIN (HEMOSTATIC MATRIX KIT) OPTIME
TOPICAL | Status: DC | PRN
  Administered 2021-06-16: 1 via TOPICAL

## 2021-06-16 MED ORDER — ROCURONIUM BROMIDE 10 MG/ML (PF) SYRINGE
PREFILLED_SYRINGE | INTRAVENOUS | Status: DC | PRN
Start: 2021-06-16 — End: 2021-06-16
  Administered 2021-06-16: 70 mg via INTRAVENOUS
  Administered 2021-06-16: 30 mg via INTRAVENOUS

## 2021-06-16 MED ORDER — PROPOFOL 10 MG/ML IV BOLUS
INTRAVENOUS | Status: DC | PRN
  Administered 2021-06-16: 120 mg via INTRAVENOUS
  Administered 2021-06-16: 20 mg via INTRAVENOUS

## 2021-06-16 MED ORDER — ONDANSETRON HCL 4 MG/2ML IJ SOLN
INTRAMUSCULAR | Status: AC
  Filled 2021-06-16: qty 2

## 2021-06-16 MED ORDER — ORAL CARE MOUTH RINSE: 15.0000 mL | Freq: Once | OROMUCOSAL | Status: AC

## 2021-06-16 MED ORDER — ONDANSETRON HCL 4 MG/2ML IJ SOLN: 4.0000 mg | Freq: Once | INTRAMUSCULAR | Status: DC | PRN

## 2021-06-16 MED ORDER — BUPIVACAINE HCL (PF) 0.5 % IJ SOLN
INTRAMUSCULAR | Status: DC | PRN
  Administered 2021-06-16: 20 mL via PERINEURAL

## 2021-06-16 SURGICAL SUPPLY — 99 items
APPLIER CLIP 11 MED OPEN (CLIP) ×3
BAG COUNTER SPONGE SURGICOUNT (BAG) ×6 IMPLANT
BLADE CLIPPER SURG (BLADE) ×1 IMPLANT
BLADE ILLUMINATOR MIS (MISCELLANEOUS) ×1 IMPLANT
BLADE SURG 10 STRL SS (BLADE) ×3 IMPLANT
CABLE BIPOLOR RESECTION CORD (MISCELLANEOUS) ×3 IMPLANT
CATH FOLEY 2WAY SLVR  5CC 16FR (CATHETERS) ×3
CATH FOLEY 2WAY SLVR 5CC 16FR (CATHETERS) ×2 IMPLANT
CLIP APPLIE 11 MED OPEN (CLIP) ×4 IMPLANT
CLIP LIGATING EXTRA MED SLVR (CLIP) ×2 IMPLANT
CLIP NEUROVISION LG (CLIP) ×1 IMPLANT
CLSR STERI-STRIP ANTIMIC 1/2X4 (GAUZE/BANDAGES/DRESSINGS) ×1 IMPLANT
COVER SURGICAL LIGHT HANDLE (MISCELLANEOUS) ×3 IMPLANT
DISSECTOR BLUNT TIP ENDO 5MM (MISCELLANEOUS) ×6 IMPLANT
DRAIN CHANNEL 15F RND FF W/TCR (WOUND CARE) IMPLANT
DRAPE C-ARM 42X72 X-RAY (DRAPES) ×3 IMPLANT
DRAPE C-ARMOR (DRAPES) ×3 IMPLANT
DRAPE INCISE IOBAN 66X45 STRL (DRAPES) ×1 IMPLANT
DRSG OPSITE POSTOP 3X4 (GAUZE/BANDAGES/DRESSINGS) ×2 IMPLANT
DRSG OPSITE POSTOP 4X10 (GAUZE/BANDAGES/DRESSINGS) ×3 IMPLANT
DRSG OPSITE POSTOP 4X6 (GAUZE/BANDAGES/DRESSINGS) ×3 IMPLANT
DURAPREP 26ML APPLICATOR (WOUND CARE) ×4 IMPLANT
ELECT BLADE 4.0 EZ CLEAN MEGAD (MISCELLANEOUS) ×3
ELECT BLADE 6.5 EXT (BLADE) ×3 IMPLANT
ELECT CAUTERY BLADE 6.4 (BLADE) ×3 IMPLANT
ELECT PENCIL ROCKER SW 15FT (MISCELLANEOUS) ×3 IMPLANT
ELECT REM PT RETURN 9FT ADLT (ELECTROSURGICAL) ×3
ELECTRODE BLDE 4.0 EZ CLN MEGD (MISCELLANEOUS) ×2 IMPLANT
ELECTRODE REM PT RTRN 9FT ADLT (ELECTROSURGICAL) ×2 IMPLANT
GAUZE 4X4 16PLY ~~LOC~~+RFID DBL (SPONGE) IMPLANT
GLOVE SRG 8 PF TXTR STRL LF DI (GLOVE) ×2 IMPLANT
GLOVE SURG ENC MOIS LTX SZ6.5 (GLOVE) ×3 IMPLANT
GLOVE SURG ENC MOIS LTX SZ7.5 (GLOVE) ×3 IMPLANT
GLOVE SURG MICRO LTX SZ7.5 (GLOVE) ×3 IMPLANT
GLOVE SURG MICRO LTX SZ8.5 (GLOVE) ×3 IMPLANT
GLOVE SURG UNDER POLY LF SZ6.5 (GLOVE) ×3 IMPLANT
GLOVE SURG UNDER POLY LF SZ8 (GLOVE) ×3
GLOVE SURG UNDER POLY LF SZ8.5 (GLOVE) ×3 IMPLANT
GOWN STRL REUS W/ TWL LRG LVL3 (GOWN DISPOSABLE) ×4 IMPLANT
GOWN STRL REUS W/ TWL XL LVL3 (GOWN DISPOSABLE) ×4 IMPLANT
GOWN STRL REUS W/TWL 2XL LVL3 (GOWN DISPOSABLE) ×6 IMPLANT
GOWN STRL REUS W/TWL LRG LVL3 (GOWN DISPOSABLE) ×6
GOWN STRL REUS W/TWL XL LVL3 (GOWN DISPOSABLE) ×6
GUIDEWIRE NITINOL BEVEL TIP (WIRE) ×1 IMPLANT
INSERT FOGARTY 61MM (MISCELLANEOUS) IMPLANT
INSERT FOGARTY SM (MISCELLANEOUS) IMPLANT
KIT BASIN OR (CUSTOM PROCEDURE TRAY) ×3 IMPLANT
KIT TURNOVER KIT B (KITS) ×3 IMPLANT
MIX DBX 10CC 35% BONE (Bone Implant) ×1 IMPLANT
MODULE EMG NDL SSEP NVM5 (NEEDLE) IMPLANT
MODULE EMG NEEDLE SSEP NVM5 (NEEDLE) ×3 IMPLANT
MODULE NVM5 NEXT GEN EMG (NEEDLE) ×1 IMPLANT
NDL SPNL 18GX3.5 QUINCKE PK (NEEDLE) ×2 IMPLANT
NEEDLE 22X1 1/2 (OR ONLY) (NEEDLE) ×3 IMPLANT
NEEDLE SPNL 18GX3.5 QUINCKE PK (NEEDLE) ×3 IMPLANT
NS IRRIG 1000ML POUR BTL (IV SOLUTION) ×3 IMPLANT
PACK LAMINECTOMY ORTHO (CUSTOM PROCEDURE TRAY) ×3 IMPLANT
PACK UNIVERSAL I (CUSTOM PROCEDURE TRAY) ×3 IMPLANT
PAD ARMBOARD 7.5X6 YLW CONV (MISCELLANEOUS) ×12 IMPLANT
PROBE BALL TIP NVM5 SNG USE (BALLOONS) ×1 IMPLANT
ROD RELINE MAS TI LORD 5.5X40 (Rod) ×2 IMPLANT
SCREW LOCK RELINE 5.5 TULIP (Screw) ×4 IMPLANT
SCREW RELINE MAS POLY 6.5X40MM (Screw) ×2 IMPLANT
SCREW RELINE RED 6.5X45MM POLY (Screw) ×2 IMPLANT
SOL ANTI FOG 6CC (MISCELLANEOUS) IMPLANT
SOLUTION ANTI FOG 6CC (MISCELLANEOUS) ×1
SPACER RISEL 22X55 8-15MM 6D (Spacer) ×1 IMPLANT
SPONGE INTESTINAL PEANUT (DISPOSABLE) ×2 IMPLANT
SPONGE SURGIFOAM ABS GEL 100 (HEMOSTASIS) ×3 IMPLANT
SPONGE T-LAP 18X18 ~~LOC~~+RFID (SPONGE) ×5 IMPLANT
SPONGE T-LAP 4X18 ~~LOC~~+RFID (SPONGE) ×9 IMPLANT
STAPLER VISISTAT 35W (STAPLE) ×3 IMPLANT
STRIP CLOSURE SKIN 1/2X4 (GAUZE/BANDAGES/DRESSINGS) ×1 IMPLANT
SURGIFLO W/THROMBIN 8M KIT (HEMOSTASIS) ×1 IMPLANT
SUT BONE WAX W31G (SUTURE) IMPLANT
SUT MNCRL AB 3-0 PS2 18 (SUTURE) ×2 IMPLANT
SUT MNCRL AB 3-0 PS2 27 (SUTURE) ×5 IMPLANT
SUT PDS AB 1 CTX 36 (SUTURE) ×6 IMPLANT
SUT PROLENE 4 0 RB 1 (SUTURE)
SUT PROLENE 4-0 RB1 .5 CRCL 36 (SUTURE) IMPLANT
SUT PROLENE 5 0 C 1 24 (SUTURE) ×2 IMPLANT
SUT PROLENE 5 0 CC1 (SUTURE) IMPLANT
SUT PROLENE 6 0 C 1 30 (SUTURE) ×2 IMPLANT
SUT PROLENE 6 0 CC (SUTURE) IMPLANT
SUT SILK 0 TIES 10X30 (SUTURE) ×2 IMPLANT
SUT SILK 2 0 TIES 10X30 (SUTURE) ×5 IMPLANT
SUT SILK 2 0SH CR/8 30 (SUTURE) IMPLANT
SUT SILK 3 0 TIES 10X30 (SUTURE) ×2 IMPLANT
SUT SILK 3 0 TIES 17X18 (SUTURE)
SUT SILK 3 0SH CR/8 30 (SUTURE) IMPLANT
SUT SILK 3-0 18XBRD TIE BLK (SUTURE) ×2 IMPLANT
SUT VIC AB 1 CT1 18XCR BRD 8 (SUTURE) ×4 IMPLANT
SUT VIC AB 1 CT1 8-18 (SUTURE) ×3
SUT VIC AB 2-0 CT1 18 (SUTURE) ×7 IMPLANT
SYR BULB IRRIG 60ML STRL (SYRINGE) ×3 IMPLANT
TAPE CLOTH 4X10 WHT NS (GAUZE/BANDAGES/DRESSINGS) ×9 IMPLANT
TOWEL GREEN STERILE (TOWEL DISPOSABLE) ×6 IMPLANT
TOWEL GREEN STERILE FF (TOWEL DISPOSABLE) ×3 IMPLANT
WATER STERILE IRR 1000ML POUR (IV SOLUTION) ×2 IMPLANT

## 2021-06-16 NOTE — Transfer of Care (Signed)
Immediate Anesthesia Transfer of Care Note  Patient: TYRECE VANTERPOOL  Procedure(s) Performed: OBLIQUE LUMBAR INTERBODY FUSION 1 LEVEL WITH PERCUTANEOUS SCREWS(OLIF L4-5 WITH POSTERIOR SPINAL FUSION INTERBODY) (Spine Lumbar) ABDOMINAL EXPOSURE  Patient Location: PACU  Anesthesia Type:General  Level of Consciousness: drowsy and patient cooperative  Airway & Oxygen Therapy: Patient Spontanous Breathing and Patient connected to nasal cannula oxygen  Post-op Assessment: Report given to RN and Post -op Vital signs reviewed and stable  Post vital signs: Reviewed and stable  Last Vitals:  Vitals Value Taken Time  BP 114/88 06/16/21 1237  Temp    Pulse 55 06/16/21 1238  Resp 17 06/16/21 1238  SpO2 93 % 06/16/21 1238  Vitals shown include unvalidated device data.  Last Pain:  Vitals:   06/16/21 0651  TempSrc: Oral  PainSc:       Patients Stated Pain Goal: 2 (42/35/36 1443)  Complications: No notable events documented.

## 2021-06-16 NOTE — H&P (Signed)
Addendum H&P: There is been no change in the patient's clinical exam since his last office visit of 06/07/2021.  We have gone over the surgical procedure which is an L4-5 fusion.  Purpose of the surgery is to treat the patient's severe back buttock and radicular right leg pain.  I have gone over the surgery as well as the risks, benefits, and alternatives and the patient is expressed an understanding and willingness to move forward with surgery.

## 2021-06-16 NOTE — Brief Op Note (Signed)
06/16/2021  12:12 PM  PATIENT:  Cory Marks  60 y.o. male  PRE-OPERATIVE DIAGNOSIS:  Grade 2 slip with radiculopathy and degenerative disc disease L4-5  POST-OPERATIVE DIAGNOSIS:  Grade 2 slip with radiculopathy and degenerative disc disease L4-5  PROCEDURE:  Procedure(s) with comments: OBLIQUE LUMBAR INTERBODY FUSION 1 LEVEL WITH PERCUTANEOUS SCREWS(OLIF L4-5 WITH POSTERIOR SPINAL FUSION INTERBODY) (N/A) - tap block with exparel 3 C-Bed ABDOMINAL EXPOSURE (N/A)  SURGEON:  Surgeon(s) and Role: Panel 1:    Melina Schools, MD - Primary Panel 2:    Marty Heck, MD - Primary  PHYSICIAN ASSISTANT:   ASSISTANTS: [name], PA   ANESTHESIA:   general  EBL:  50 mL   BLOOD ADMINISTERED:none  DRAINS: none   LOCAL MEDICATIONS USED:  NONE  SPECIMEN:  No Specimen  DISPOSITION OF SPECIMEN:  N/A  COUNTS:  YES  TOURNIQUET:  * No tourniquets in log *  DICTATION: .Dragon Dictation  PLAN OF CARE: Admit to inpatient   PATIENT DISPOSITION:  PACU - hemodynamically stable.

## 2021-06-16 NOTE — Anesthesia Procedure Notes (Signed)
Procedure Name: Intubation Date/Time: 06/16/2021 8:43 AM Performed by: Lance Coon, CRNA Pre-anesthesia Checklist: Patient identified, Emergency Drugs available, Suction available, Patient being monitored and Timeout performed Patient Re-evaluated:Patient Re-evaluated prior to induction Oxygen Delivery Method: Circle system utilized Preoxygenation: Pre-oxygenation with 100% oxygen Induction Type: IV induction Ventilation: Mask ventilation without difficulty Laryngoscope Size: Miller and 3 Grade View: Grade I Tube type: Oral Tube size: 7.5 mm Number of attempts: 1 Airway Equipment and Method: Stylet Placement Confirmation: ETT inserted through vocal cords under direct vision, positive ETCO2 and breath sounds checked- equal and bilateral Secured at: 22 cm Tube secured with: Tape Dental Injury: Teeth and Oropharynx as per pre-operative assessment

## 2021-06-16 NOTE — Op Note (Signed)
Date: June 16, 2021  Preoperative diagnosis: Chronic lower back pain with left leg radiculopathy  Postoperative diagnosis: Same  Procedure: Oblique spine exposure at the L4-L5 disc space for L4-L5 OLIF  Surgeon: Dr. Marty Heck, MD  Co-surgeon: Dr. Melina Schools, MD  Assistant: [name], PA  Indications: Patient is a 60 year old male who has chronic severe lower back pain with left leg radiculopathy.  He has failed conservative management.  Dr. [name] recommended an L4-L5 approach.  Given previous laparotomy for splenectomy in the setting of mantle cell lymphoma we recommended an oblique retroperitoneal approach for L4-L5 OLIF.  He presents after risk benefits discussed.  Findings: Patient was rolled in the right lateral physician.  Incision was made over the left oblique muscles where the L4-L5 disc space was marked and a muscle-sparing technique was used to divide the layers of oblique muscle and entered into the retroperitoneum after the peritoneum and ureter were mobilized across midline and the left psoas was identified.  The left psoas was then mobilized lateral to the disc space and the L4-L5 disc space was exposed from on oblique approach and a fixed globus retractor was placed with 3 blades.  We confirmed we were at the correct level with a spinal needle in the disc space.  Anesthesia: General  Details: Patient was taken to the operating room after informed consent was obtained.  Placed on the operative table in the supine position.  General endotracheal anesthesia was induced.  Patient was then rolled in the right lateral position and all pressure points were padded.  Fluoroscopic C-arm was brought in and the L4-L5 disc space was marked over the left oblique muscles about 2 fingerbreadths off the iliac crest.  The abdomen and left flank and back were all prepped and draped in standard sterile fashion.  Timeout was performed and antibiotics were given.  Incision was made  over our preoperative mark and then dissected down through the subcutaneous tissue with Bovie cautery.  Cerebellar retractors were used for added visualization.  I then performed a muscle-sparing technique between the 3 layers of the oblique muscles and I identified the peritoneum.  This was then swept down away from the left psoas with the patient in the right lateral position including the left ureter.  I then used hand-held Wiley retractors to visualize the left psoas as well as the left iliac artery.  The psoas muscle was then mobilized lateral to the disc base bluntly with suction and a laparoscopic dissector.  Ultimately once I had good working room the fixed globus retractor was placed on the field.  160 reverse lip was placed medial to the disc base to retract the iliac vessels.  I then placed two 160 reverse lip retractor blades on the lateral side of the disc space to keep the psoas muscle mobilized away from the disc space.  There appeared to be good working room.  Needle was placed in the disc and we confirmed we were at the correct level withl fluoroscopy.  Case was turned over to Dr. [name]  Complication: None  Condition: Stable  Marty Heck, MD [facility] Office: [place]

## 2021-06-16 NOTE — Op Note (Signed)
OPERATIVE REPORT  DATE OF SURGERY: 06/16/2021  PATIENT NAME:  Cory Marks MRN: [id number] DOB: September 24, 1960  PCP: Cipriano Mile, NP  PRE-OPERATIVE DIAGNOSIS: Degenerative spondylolisthesis L4-5 with lumbar spinal stenosis and radiculopathy  POST-OPERATIVE DIAGNOSIS: Same  PROCEDURE:   OLIF L4/5 Posterior instrumentation fusion L4-5  SURGEON:  Melina Schools, MD  PHYSICIAN ASSISTANT: [name], PA  Approach surgeon: Dr. Monica Martinez  ANESTHESIA:   General  EBL: 50 ml   Complications: None  Implants: Globus rise L cage.  22 mm.  8 to 15 mm / 6 degree lordosis.  Expanded to 12.5 mm NuVasive MIS pedicle screws.  L5: 6.5 x 40 mm length.  L4: 6.5 x 45 mm length.  40 mm length rod  Graft: DBX mix  Neuro monitoring: No adverse EMG/SSEP activity throughout the case.  All 4 pedicle screws were directly stimulated and there was no adverse activity at greater than 40 mA  BRIEF HISTORY: Cory Marks is a 60 y.o. male who presents to my office with complaints of significant back buttock and radicular left leg pain imaging studies demonstrated degenerative spondylolisthesis contributing to lumbar spinal stenosis with nerve compression.  Despite appropriate conservative management his overall quality of life is continued to deteriorate as result of the failure of conservative management and elected to proceed with surgery.  All appropriate risks, benefits, and alternatives were discussed with the patient and consent was obtained  PROCEDURE DETAILS: Patient was brought into the operating room and was properly positioned on the operating room table.  After induction with general anesthesia the patient was endotracheally intubated.  A timeout was taken to confirm all important data: including patient, procedure, and the level. Teds, a Foley, and SCD's were applied.   The patient was placed in the decubitus position right side down and all bony prominences well-padded.  An axillary  roll was positioned in the left arm was placed into the arm holder.  The patient was then secured directly to the table with tape in such a way that we had accurate visualization of the L4-5 level.  The abdomen and posterior lumbar spine were prepped and draped in a standard fashion.  At this point time Dr. Carlis Abbott scrubbed in and performed a standard oblique retroperitoneal process to the lumbar spine. Please refer to his dictation for specifics on the approach.  Once the retractors were properly restrained Dr. Carlis Abbott scrubbed out and I scrubbed back in and proceeded with the surgery.  Needle was placed into the L4-5 level and an x-ray was taken confirming that we are at the appropriate level.  An annulotomy was then performed with a 10 blade scalpel and then I used pituitary rongeurs to remove the bulk of the disc material I then placed a Cobb elevator along the superior aspect of the L5 endplate and advanced across to the contralateral annulus.  I made sure to release the contralateral annulus and I made sure removing the cartilaginous endplate.  I then took a second elevator and released the disc from the L4 endplate.  Using pituitary rongeurs curettes and Kerrison rongeurs I removed all the disc material.  I then used fine curettes to continue my dissection posteriorly.  Ultimately using a fine nerve hook I was able to dissect through the posterior annulus and completely released the annulus from the posterior aspect of the L4 and L5 vertebral body.  I confirmed that my discectomy was adequate by imaging I was able to visualize and imaged in nerve hook behind the  vertebral body of L4 and L5 at the level of the pedicle on the lateral view in both the right and left corners.  I then used a rasp to ensure I had bleeding subchondral bone and then I put the trialing device.  I elected to use a 55 mm implant this provided the best overall surface.  I used a large 22 mm cage so I get maximum surface area.  The cage was  obtained and packed with the DBX mix and I gently inserted into the proper position.  I confirmed satisfactory position in both the AP and lateral planes.  I then expanded the cage to a proximal height of 12.5 mm.  The cage that was now well seated in the disc space and imaging studies demonstrate satisfactory position in both planes.  I remove the inserting handle and then placed additional bone graft with the backfilling device into the cage.  I also placed bone graft along the anterior aspect of the cage to facilitate a sentinel fusion.  After copiously irrigating with normal saline I confirmed that hemostasis with bipolar cautery and Floseal.  Retractors were sequentially removed and I closed the fascia of the external oblique with a running #1 PDS stitch.  Superficial was closed with interrupted 2-0 Vicryl suture, and a 3-0 Monocryl was used for the skin prior to closing the skin we did take final AP x-ray and this was read by the radiologist confirming no surgical instruments in the wound.  I then went to the posterior aspect of the lumbar spine and using fluoroscopy identified the lateral borders of the L4 and L5 pedicle and marked them on the skin surface.  Small stab incision was made and I advanced the Jamshidi needle through the incision percutaneously to the lateral aspect of the pedicle.  Using fluoroscopy and neuro monitoring I advanced the Jamshidi needle into the L4 pedicle I confirmed trajectory with the AP x-ray.  As I neared the medial wall of the pedicle I switched to the lateral view to confirm that was just beyond the posterior wall of the vertebral body.  In addition there is no adverse MG activity while stimulating the Jamshidi needle.  I advanced into the vertebral body and then placed the guidepin through the center of the Jamshidi needle to cannulate the pedicle.  Using this exact same technique I cannulated the L5 pedicle and the contralateral L4 and L5 pedicles.  Once all 4 pedicles  were cannulated I then measured and obtained the appropriate sized pedicle screw and placed it over the guidepin all 4 pedicles.  Had excellent purchase and were properly situated.  I then directly stimulated all 4 pedicle screws and there was no adverse activity at greater than 40 mA.  I then measured and obtained the 40 mm length rod.  Rod was then placed percutaneously and I confirmed that it was properly seated and in both pedicle screw heads.  I then placed the locking caps and secured all 4 locking caps.  All 4 locking caps were then tightened/torqued according manufacture standards.  The inserting tabs were broken off and removed and final x-rays were taken demonstrate satisfactory position of the intervertebral cage as well as the pedicle screw construct.  For posterior wounds were copiously irrigated with normal saline and closed in a layered fashion with interrupted #1 Vicryl suture, 2-0 Vicryl suture, and a 3-0 Monocryl.  Steri-Strips and dry dressings were applied and the patient was ultimately extubated and transferred to the PACU without  incident the end of the case all needle sponge counts were correct.  There were no adverse intraoperative events.  First Assistant: [name], PA.  She was instrumental in assisting with positioning the patient, retraction for visualization, wound closure, and assisted with implantation of the hardware.  Melina Schools, MD 06/16/2021 11:56 AM

## 2021-06-16 NOTE — Anesthesia Procedure Notes (Signed)
Anesthesia Regional Block: TAP block  Laterality: Left  Prep: chloraprep       Needles:  Injection technique: Single-shot  Needle Type: Echogenic Stimulator Needle     Needle Length: 10cm  Needle Gauge: 21   Needle insertion depth: 8 cm   Additional Needles:   Procedures:,,,, ultrasound used (permanent image in chart),,    Narrative:  Start time: 06/16/2021 8:05 AM End time: 06/16/2021 8:10 AM Injection made incrementally with aspirations every 5 mL.  Performed by: Personally  Anesthesiologist: Josephine Igo, MD  Additional Notes: Timeout performed. Patient sedated. Relevant anatomy ID'd using [place]. Incremental 2-95ml injection of LA with frequent aspiration. Patient tolerated procedure well.    Left TAP Block

## 2021-06-16 NOTE — OR Nursing (Signed)
No retains products or instruments seen on x-ray per radiology (Dr. Nyoka Cowden). MD aware.

## 2021-06-16 NOTE — Discharge Instructions (Signed)

## 2021-06-16 NOTE — Anesthesia Postprocedure Evaluation (Signed)
Anesthesia Post Note  Patient: Cory Marks  Procedure(s) Performed: OBLIQUE LUMBAR INTERBODY FUSION 1 LEVEL WITH PERCUTANEOUS SCREWS(OLIF L4-5 WITH POSTERIOR SPINAL FUSION INTERBODY) (Spine Lumbar) ABDOMINAL EXPOSURE     Patient location during evaluation: PACU Anesthesia Type: General Level of consciousness: awake and alert and oriented Pain management: pain level controlled Vital Signs Assessment: post-procedure vital signs reviewed and stable Respiratory status: spontaneous breathing, nonlabored ventilation and respiratory function stable Cardiovascular status: blood pressure returned to baseline and stable Postop Assessment: no apparent nausea or vomiting Anesthetic complications: no   No notable events documented.  Last Vitals:  Vitals:   06/16/21 1307 06/16/21 1322  BP: (!) 116/91 110/90  Pulse: (!) 46 (!) 44  Resp: 17 20  Temp:    SpO2: 100% 98%    Last Pain:  Vitals:   06/16/21 1322  TempSrc:   PainSc: Asleep                 Maribel Luis A.

## 2021-06-16 NOTE — H&P (Signed)
History and Physical Interval Note:  06/16/2021 8:12 AM  Cory Marks  has presented today for surgery, with the diagnosis of Grade 2 slip with radiculopathy and degenerative disc disease L4-5.  The various methods of treatment have been discussed with the patient and family. After consideration of risks, benefits and other options for treatment, the patient has consented to  Procedure(s) with comments: OBLIQUE LUMBAR INTERBODY FUSION 1 LEVEL WITH PERCUTANEOUS SCREWS(OLIF L4-5 WITH POSTERIOR SPINAL FUSION INTERBODY) (N/A) - tap block with exparel 3 C-Bed ABDOMINAL EXPOSURE (N/A) as a surgical intervention.  The patient's history has been reviewed, patient examined, no change in status, stable for surgery.  I have reviewed the patient's chart and labs.  Questions were answered to the patient's satisfaction.    L4-L5 OLIF.  Marty Heck  Patient name: Cory Marks          MRN: [id number]        DOB: January 17, 1961            Sex: male   REASON FOR CONSULT: Evaluate for L4-L5 OLIF   HPI: Cory Marks is a 60 y.o. male, with history of chronic kidney disease, hypertension, hyperlipidemia, diabetes, coronary artery disease, mantle cell lymphoma with history of splenectomy that presents for evaluation of L4-L5 OLIF.  Patient has had chronic severe lower back pain with left leg radiculopathy for greater than 40 years.  He has failed conservative management.  He has been evaluated by Dr. [name] who has recommended an OLIF at L4-L5.  Vascular surgery was asked to assist with abdominal exposure.  His only abdominal surgery is a splenectomy through a midline laparotomy.  He is here walking with a walker.       Past Medical History:  Diagnosis Date   Arthritis     Bell's palsy      left eye droops a little   CAD (coronary artery disease)     Cancer ([place]) 2012    Mantle Cell Cancer- S/P stem cell transplant   CKD (chronic kidney disease) stage 3, GFR 30-59 ml/min ([place])      per lov with  pcp,  last seen at [facility] january 2020 per patient, stable per patient    Diabetes mellitus without complication ([place])      hx of lost weight due to cancer not on medications since 2014    Dysrhythmia      atrial fib and atrial flutter, no repeat of dysrhymia since ablation in 2018 ,    Eye injury      left eye hit by a wood chip early june 2020, lov for f/u with opthamologist 6-17 , drained a pustule,  progress notes mention eye gradually improving with application of antibiotic  eye drops    History of chemotherapy last tx 2014   History of diabetes mellitus NONE SINCE 2012 CANCER   History of splenectomy 01/30/2011   Hyperlipemia     Hypertension     Neuromuscular disorder ([place])      peripheral neuropathy   Neuropathy      right leg due to football injury   Polysubstance abuse ([place])     Sinus bradycardia             Past Surgical History:  Procedure Laterality Date   A-FLUTTER ABLATION N/A 09/12/2016    Procedure: A-Flutter Ablation;  Surgeon: Will Meredith Leeds, MD;  Location: [facility];  Service: Cardiovascular;  Laterality: N/A;   AMPUTATION Right 10/20/2014  Procedure: AMPUTATION RIGHT GREAT TOE;  Surgeon: Gaynelle Arabian, MD;  Location: [facility];  Service: Orthopedics;  Laterality: Right;   AMPUTATION Right 12/25/2014    Procedure: RIGHT 2ND TOE AMPUTATION ;  Surgeon: Wylene Simmer, MD;  Location: [facility];  Service: Orthopedics;  Laterality: Right;   AMPUTATION Right 12/20/2019    Procedure: RIGHT FOOT AMPUTATION TOES;  Surgeon: Newt Minion, MD;  Location: [place];  Service: Orthopedics;  Laterality: Right;   AMPUTATION Right 01/22/2020    Procedure: RIGHT FOOT 5TH RAY AMPUTATION;  Surgeon: Newt Minion, MD;  Location: [place];  Service: Orthopedics;  Laterality: Right;   edentulation N/A 2019    prior to transplant   [place] Right 01/31/2019    Procedure: REVERSE SHOULDER  ARTHROPLASTY;  Surgeon: Justice Britain, MD;  Location: [facility];  Service: Orthopedics;  Laterality: Right;   right knee surgery Right      nerve graft back of leg   SPLENECTOMY, TOTAL   01/30/2011    for splenic rupture in settin of mantle cell lymphoma   TENOLYSIS Right 12/25/2014    Procedure: RIGHT 3,4,5 FLEXOR TENDON RELEASE ;  Surgeon: Wylene Simmer, MD;  Location: [facility];  Service: Orthopedics;  Laterality: Right;   TOTAL KNEE ARTHROPLASTY Left 01/30/2017    Procedure: LEFT TOTAL KNEE ARTHROPLASTY;  Surgeon: Gaynelle Arabian, MD;  Location: [facility];  Service: Orthopedics;  Laterality: Left;           Family History  Problem Relation Age of Onset   Hypertension Mother     Diabetes Mother     Cancer Father          pancreatic cancer   Diabetes Father     Heart disease Father     Diabetes Sister        SOCIAL HISTORY: Social History         Socioeconomic History   Marital status: Married      Spouse name: Not on file   Number of children: Not on file   Years of education: Not on file   Highest education level: Not on file  Occupational History   Not on file  Tobacco Use   Smoking status: Never   Smokeless tobacco: Never  Vaping Use   Vaping Use: Never used  Substance and Sexual Activity   Alcohol use: Yes      Alcohol/week: 6.0 standard drinks      Types: 6 Cans of beer per week      Comment: occasional    Drug use: Yes      Frequency: 3.0 times per week      Types: Marijuana   Sexual activity: Yes      Partners: Female  Other Topics Concern   Not on file  Social History Narrative   Not on file    Social Determinants of Health    Financial Resource Strain: Not on file  Food Insecurity: Not on file  Transportation Needs: Not on file  Physical Activity: Not on file  Stress: Not on file  Social Connections: Not on file  Intimate Partner Violence: Not on file          Allergies  Allergen Reactions   Oxycodone Itching and Palpitations             Current Outpatient Medications  Medication Sig Dispense Refill   acetaminophen (TYLENOL) 325 MG tablet  Take 650-975 mg by mouth every 6 (six) hours as needed for mild pain or headache.       amLODipine (NORVASC) 10 MG tablet Take 1 tablet (10 mg total) by mouth every morning. (Patient taking differently: Take 10 mg by mouth daily.) 90 tablet 1   atorvastatin (LIPITOR) 20 MG tablet Take 20 mg by mouth daily.       diclofenac Sodium (VOLTAREN) 1 % GEL Apply 2 g topically daily as needed (pain).       dorzolamide (TRUSOPT) 2 % ophthalmic solution Place 1 drop into the left eye 2 (two) times daily.       gabapentin (NEURONTIN) 300 MG capsule Take 300-600 mg by mouth See admin instructions. Taking 1 capsule (300mg ) in the Am & PM and 2 capsules ( 600mg ) at bedtime       HYDROmorphone (DILAUDID) 4 MG tablet Take 4 mg by mouth 2 (two) times daily as needed for moderate pain.       lisinopril (ZESTRIL) 5 MG tablet Take 5 mg by mouth daily.        Nutritional Supplements (IMMUNE ENHANCE) TABS Take 1 tablet by mouth daily.       pantoprazole (PROTONIX) 40 MG tablet Take 40 mg by mouth daily.       penicillin v potassium (VEETID) 500 MG tablet Take 500 mg by mouth 2 (two) times daily.       pyridOXINE (VITAMIN B-6) 100 MG tablet Take 100 mg by mouth daily.       timolol (TIMOPTIC) 0.5 % ophthalmic solution Place 1 drop into the left eye 2 (two) times daily.         No current facility-administered medications for this visit.      REVIEW OF SYSTEMS:  [X]  denotes positive finding, [ ]  denotes negative finding Cardiac   Comments:  Chest pain or chest pressure:      Shortness of breath upon exertion:      Short of breath when lying flat:      Irregular heart rhythm:             Vascular      Pain in calf, thigh, or hip brought on by ambulation:      Pain in feet at night that wakes you up from your sleep:  x    Blood clot in your veins:      Leg swelling:              Pulmonary       Oxygen at home:      Productive cough:       Wheezing:              Neurologic      Sudden weakness in arms or legs:       Sudden numbness in arms or legs:       Sudden onset of difficulty speaking or slurred speech:      Temporary loss of vision in one eye:       Problems with dizziness:              Gastrointestinal      Blood in stool:       Vomited blood:              Genitourinary      Burning when urinating:       Blood in urine:             Psychiatric  Major depression:              Hematologic      Bleeding problems:      Problems with blood clotting too easily:             Skin      Rashes or ulcers:             Constitutional      Fever or chills:          PHYSICAL EXAM:    Vitals:    06/15/21 0820  BP: 122/82  Pulse: (!) 50  Resp: 18  Temp: 97.6 F (36.4 C)  TempSrc: Temporal  SpO2: 94%  Weight: 205 lb (93 kg)  Height: 6' (1.829 m)      GENERAL: The patient is a well-nourished male, in no acute distress. The vital signs are documented above. CARDIAC: There is a regular rate and rhythm.  VASCULAR:  Palpable femoral pulses bilaterally Right foot toes amputated with palpable PT Left DP palpable PULMONARY: No respiratory distress. ABDOMEN: Soft and non-tender.  Midline laparotomy well-healed MUSCULOSKELETAL: There are no major deformities or cyanosis. NEUROLOGIC: No focal weakness or paresthesias are detected. SKIN: There are no ulcers or rashes noted. PSYCHIATRIC: The patient has a normal affect.       DATA:    MRI reviewed as pictured above   Assessment/Plan:   60 year old male with multiple comorbidities that presents with chronic lower back pain with left leg radiculopathy.  He has L4-L5 degenerative spondylolisthesis with severe stenosis.  Vascular surgery was asked to evaluate for exposure at L4-L5 for L4-L5 OLIF.  After review of the MRI, I think he would be a good candidate for OLIF.  He has had a previous midline laparotomy  for splenectomy but I think we can stay away from this by doing an oblique exposure.  I discussed he would be in the right lateral position with an incision over the left oblique muscles at the L4-L5 disc space.  After performing muscle-sparing technique would enter the retroperitoneum to move the peritoneum and ureter to get to the L4-5 disc space and much more about moving the left psoas.  Some risk of vessel injury as well.  All questions answered.  Risk and benefits discussed.  Look forward to assisting Dr. [name] tomorrow.     Marty Heck, MD [facility] Office: [phone]

## 2021-06-17 ENCOUNTER — Encounter (HOSPITAL_COMMUNITY): Payer: Self-pay | Admitting: Orthopedic Surgery

## 2021-06-17 DIAGNOSIS — Z9889 Other specified postprocedural states: Secondary | ICD-10-CM

## 2021-06-17 LAB — GLUCOSE, CAPILLARY: Glucose-Capillary: 109 mg/dL — ABNORMAL HIGH (ref 70–99)

## 2021-06-17 MED ORDER — SORBITOL 70 % SOLN
30.0000 mL | Freq: Every day | Status: DC | PRN
  Administered 2021-06-17: 30 mL via ORAL
  Filled 2021-06-17: qty 30

## 2021-06-17 NOTE — Progress Notes (Signed)
Discharge instructions explained to patient, questions answered satisfactorily and patient verbalized understanding. Discharge papers including scripts given to patient.  Patient discharge to home via wheelchair with his mother.

## 2021-06-17 NOTE — Progress Notes (Addendum)
[facility]   Assessment/Planning: POD # 1 Oblique spine exposure at the L4-L5 disc space for L4-L5 OLIF  Abdomin soft, incision dressing clean and dry without surrounding hematoma Stable from a vascular point of view    Subjective  - doing well over all   Objective (!) 121/93 (!) 48 98.2 F (36.8 C) (Oral) 18 97%  Intake/Output Summary (Last 24 hours) at 06/17/2021 0732 Last data filed at 06/17/2021 0447 Gross per 24 hour  Intake 2050 ml  Output 1200 ml  Net 850 ml   Abdomin soft, incision healing well without hematoma or active drainage Moving B LE, warm to touch sensation grossly intact Heart RRR Lungs non labored breathing    [name] 06/17/2021 7:32 AM --  Laboratory Lab Results: No results for input(s): WBC, HGB, HCT, PLT in the last 72 hours. BMET No results for input(s): NA, K, CL, CO2, GLUCOSE, BUN, CREATININE, CALCIUM in the last 72 hours.  COAG Lab Results  Component Value Date   INR 1.0 06/09/2021   INR 0.91 01/25/2017   INR 2.2 09/16/2016   No results found for: PTT  I have seen and evaluated the patient. I agree with the PA note as documented above.  Postop day 1 status post abdominal exposure for L4-L5 OLIF.  Incision looks good.  Appropriate postop incisional tenderness.  Palpable DP pulse in the left foot.  Tolerated liquids without any nausea or vomiting.  Overall looks good from a vascular standpoint.  Marty Heck, MD [facility] Office: [phone]

## 2021-06-17 NOTE — Progress Notes (Signed)
    Subjective: Procedure(s) (LRB): OBLIQUE LUMBAR INTERBODY FUSION 1 LEVEL WITH PERCUTANEOUS SCREWS(OLIF L4-5 WITH POSTERIOR SPINAL FUSION INTERBODY) (N/A) ABDOMINAL EXPOSURE (N/A) 1 Day Post-Op  Patient reports pain as 2 on 0-10 scale.  Reports decreased leg pain reports incisional back pain   Positive void Negative bowel movement Positive flatus Negative chest pain or shortness of breath  Objective: Vital signs in last 24 hours: Temp:  [97.6 F (36.4 C)-99.1 F (37.3 C)] 98.2 F (36.8 C) (11/10 0722) Pulse Rate:  [44-58] 48 (11/10 0722) Resp:  [16-21] 18 (11/10 0722) BP: (110-130)/(84-93) 121/93 (11/10 0722) SpO2:  [94 %-100 %] 97 % (11/10 0722)  Intake/Output from previous day: 11/09 0701 - 11/10 0700 In: 2050 [I.V.:2000] Out: 1200 [Urine:1150; Blood:50]  Labs: No results for input(s): WBC, RBC, HCT, PLT in the last 72 hours. No results for input(s): NA, K, CL, CO2, BUN, CREATININE, GLUCOSE, CALCIUM in the last 72 hours. No results for input(s): LABPT, INR in the last 72 hours.  Physical Exam: Neurologically intact ABD soft Intact pulses distally Dorsiflexion/Plantar flexion intact Incision: dressing C/D/I and no drainage Compartment soft Body mass index is 28.21 kg/m.   Assessment/Plan: Patient stable  Continue mobilization with physical therapy Continue care  Patient doing well overall.  He has been ambulating in the hall without difficulty.  Positive spontaneous voiding.  His pain is well controlled with oral medications. Plan on discharge to home later today.  Family member is going to bring his brace and I would like him to apply the brace to ensure that it does not irritate the wound while he is here in the hospital.  If he does well ambulating with the brace that he can be discharged home. Will follow up in 2 weeks for wound check.  Melina Schools, MD [facility] [phone]

## 2021-06-17 NOTE — Evaluation (Signed)
Occupational Therapy Evaluation Patient Details Name: Cory Marks MRN: [id number] DOB: 03-10-1961 Today's Date: 06/17/2021   History of Present Illness Pt is a 60 y/o M s/p OLIF and posterior fusion L4-5. PMH includes bells palsy, arthritis, DM, CKD, HTN, and R toe amputation (2021)   Clinical Impression   Pt independent at baseline with ADLs and uses RW for functional mobility. Pt plans to go to mother's house at d/c. Educated pt on back precautions, log rolling technique, and compensatory strategies for LE dressing, pt verbalized and demonstrated understanding. Pt has mild unsteadiness during transfers, requiring moderate cues for hand placement on walker. Performs all ADLs with min guard-min A, however unable to complete simulated lateral step tub transfer safely, is safely able to perform backing up with RW. Pt presenting with decreased activity tolerance, balance, ROM, and strength at this time. Will continue to follow acutely to address impairments listed. Pt to benefit from d/c home vs. HHOT pending progress.     Recommendations for follow up therapy are one component of a multi-disciplinary discharge planning process, led by the attending physician.  Recommendations may be updated based on patient status, additional functional criteria and insurance authorization.   Follow Up Recommendations  No OT follow up    Assistance Recommended at Discharge Intermittent Supervision/Assistance  Functional Status Assessment  Patient has had a recent decline in their functional status and demonstrates the ability to make significant improvements in function in a reasonable and predictable amount of time.  Equipment Recommendations       Recommendations for Other Services PT consult     Precautions / Restrictions Precautions Precautions: Back Required Braces or Orthoses: Other Brace (pt has brace, left at home, states parent will bring it later in the morning) Restrictions Weight Bearing  Restrictions: No Other Position/Activity Restrictions: back precautions      Mobility Bed Mobility Overal bed mobility: Needs Assistance Bed Mobility: Sidelying to Sit   Sidelying to sit: Supervision;HOB elevated       General bed mobility comments: educated pt on log rolling technique    Transfers Overall transfer level: Needs assistance Equipment used: Rolling walker (2 wheels) Transfers: Sit to/from Stand Sit to Stand: Min guard           General transfer comment: pt required moderate verbal cuing for hand placement on bed/walker during transfers      Balance Overall balance assessment: Mild deficits observed, not formally tested;Needs assistance Sitting-balance support: Feet supported Sitting balance-Leahy Scale: Good     Standing balance support: Bilateral upper extremity supported;During functional activity;Reliant on assistive device for balance Standing balance-Leahy Scale: Fair                             ADL either performed or assessed with clinical judgement   ADL Overall ADL's : Needs assistance/impaired Eating/Feeding: Set up;Sitting Eating/Feeding Details (indicate cue type and reason): eats sitting unsupported EOB Grooming: Oral care;Wash/dry face;Standing;Cueing for safety;Min guard Grooming Details (indicate cue type and reason): required v/cing to spit into cup to adhere to back precautions Upper Body Bathing: Min guard;Sitting   Lower Body Bathing: Minimal assistance;Min guard;Sitting/lateral leans   Upper Body Dressing : Supervision/safety;Sitting Upper Body Dressing Details (indicate cue type and reason): unable to educate pt on donning/doffing of brace, pt left brace at home Lower Body Dressing: Minimal assistance;Sit to/from stand Lower Body Dressing Details (indicate cue type and reason): simlated LE dressing using compensatory strategies of pivoting to prop  knee up on bed Toilet Transfer: Min guard;Rolling walker (2  wheels);Cueing for safety Toilet Transfer Details (indicate cue type and reason): pt requring moderate cuing to keep hands on RW during sitting/standing Toileting- Clothing Manipulation and Hygiene: Minimal assistance;Sit to/from stand   Tub/ Shower Transfer: Tub transfer;Rolling walker (2 wheels);Ambulation Tub/Shower Transfer Details (indicate cue type and reason): pt unable to safely step laterally over side of tub with wall support' Functional mobility during ADLs: Min guard;Rolling walker (2 wheels);Cueing for safety       Vision   Vision Assessment?: No apparent visual deficits     Perception     Praxis      Pertinent Vitals/Pain Pain Assessment: No/denies pain     Hand Dominance     Extremity/Trunk Assessment Upper Extremity Assessment Upper Extremity Assessment: Overall WFL for tasks assessed   Lower Extremity Assessment Lower Extremity Assessment: Defer to PT evaluation   Cervical / Trunk Assessment Cervical / Trunk Assessment: Back Surgery   Communication Communication Communication: No difficulties   Cognition Arousal/Alertness: Awake/alert Behavior During Therapy: Flat affect;WFL for tasks assessed/performed Overall Cognitive Status: Within Functional Limits for tasks assessed                                       General Comments       Exercises     Shoulder Instructions      Home Living Family/patient expects to be discharged to:: Private residence Living Arrangements: Alone;Other (Comment) (plans to d/c home with parent) Available Help at Discharge: Family (Has help from mom (39 y/o.), pt states she is in good health/mobility) Type of Home: House Home Access: Stairs to enter [employer] of Steps: 4   Home Layout: Two level;Able to live on main level with bedroom/bathroom     Bathroom Shower/Tub: Teacher, early years/pre: Standard     Home Equipment: Conservation officer, nature (2 wheels)   Additional Comments:  has shower chair      Prior Functioning/Environment Prior Level of Function : Independent/Modified Independent             Mobility Comments: uses RW at baseline ADLs Comments: states mom will be doing all the driving        OT Problem List: Decreased range of motion;Decreased activity tolerance;Decreased safety awareness;Decreased knowledge of use of DME or AE;Decreased strength;Impaired balance (sitting and/or standing)      OT Treatment/Interventions: Self-care/ADL training;DME and/or AE instruction;Patient/family education;Balance training    OT Goals(Current goals can be found in the care plan section) Acute Rehab OT Goals Patient Stated Goal: return home OT Goal Formulation: With patient Time For Goal Achievement: 07/01/21 Potential to Achieve Goals: Good  OT Frequency: Min 2X/week   Barriers to D/C: Decreased caregiver support          Co-evaluation              AM-PAC OT "6 Clicks" Daily Activity     Outcome Measure Help from another person eating meals?: None Help from another person taking care of personal grooming?: None Help from another person toileting, which includes using toliet, bedpan, or urinal?: A Little Help from another person bathing (including washing, rinsing, drying)?: A Little Help from another person to put on and taking off regular upper body clothing?: A Little Help from another person to put on and taking off regular lower body clothing?: A Lot 6 Click Score: 19  End of Session Equipment Utilized During Treatment: Gait belt;Rolling walker (2 wheels) Nurse Communication: Mobility status;Other (comment) (asked about pt's brace)  Activity Tolerance: Patient tolerated treatment well Patient left: in bed;with call bell/phone within reach (pt sitting EOB eating breakfast)  OT Visit Diagnosis: Unsteadiness on feet (R26.81);Other abnormalities of gait and mobility (R26.89);Pain;Muscle weakness (generalized) (M62.81)                 Time: 4451-4604 OT Time Calculation (min): 29 min Charges:  OT General Charges $OT Visit: 1 Visit OT Evaluation $OT Eval Low Complexity: 1 Low OT Treatments $Self Care/Home Management : 8-22 mins  Lynnda Child, OTD, OTR/L Acute Rehab [phone]) [phone] - [place] 06/17/2021, 9:14 AM

## 2021-06-17 NOTE — Evaluation (Signed)
Physical Therapy Evaluation  Patient Details Name: Cory Marks MRN: [id number] DOB: 12/02/1960 Today's Date: 06/17/2021  History of Present Illness  Pt is a 60 y/o M s/p OLIF and posterior fusion L4-5. PMH includes bells palsy, arthritis, DM, CKD, HTN, and R toe amputation (2021)   Clinical Impression  Pt admitted with above diagnosis. At the time of PT eval, pt was able to demonstrate transfers and ambulation with gross min guard assist to supervision for safety. Pt was educated on precautions, brace application/wearing schedule, appropriate activity progression, and car transfer. Brace adjusted for optimal fit. Pt currently with functional limitations due to the deficits listed below (see PT Problem List). Pt will benefit from skilled PT to increase their independence and safety with mobility to allow discharge to the venue listed below.         Recommendations for follow up therapy are one component of a multi-disciplinary discharge planning process, led by the attending physician.  Recommendations may be updated based on patient status, additional functional criteria and insurance authorization.  Follow Up Recommendations Home health PT    Assistance Recommended at Discharge PRN  Functional Status Assessment Patient has had a recent decline in their functional status and demonstrates the ability to make significant improvements in function in a reasonable and predictable amount of time.  Equipment Recommendations  None recommended by PT    Recommendations for Other Services       Precautions / Restrictions Precautions Precautions: Back Precaution Booklet Issued: Yes (comment) Precaution Comments: Reviewed handout and pt was cued for precautions during functional mobility. Required Braces or Orthoses: Spinal Brace Spinal Brace: Lumbar corset;Applied in sitting position Restrictions Weight Bearing Restrictions: No Other Position/Activity Restrictions: back precautions       Mobility  Bed Mobility Overal bed mobility: Needs Assistance Bed Mobility: Sidelying to Sit;Rolling Rolling: Supervision Sidelying to sit: Supervision       General bed mobility comments: Poor technique. VC's throughout. Encouraged pt to practice again however urgent need to use the bathroom so did not.    Transfers Overall transfer level: Needs assistance Equipment used: Rolling walker (2 wheels) Transfers: Sit to/from Stand Sit to Stand: Min guard           General transfer comment: VC's for hand placement on seated surface for safety. No assist required however pt effortful to complete transfer.    Ambulation/Gait Ambulation/Gait assistance: Min guard Gait Distance (Feet): 300 Feet Assistive device: Rolling walker (2 wheels) Gait Pattern/deviations: Step-through pattern;Decreased stride length;Trunk flexed Gait velocity: Decreased Gait velocity interpretation: <1.31 ft/sec, indicative of household ambulator   General Gait Details: VC's for improved posture, closer walker proximity and forward gaze. No gross unsteadiness or LOB noted however pt appears guarded throughout gait training.  Stairs Stairs: Yes Stairs assistance: Min guard Stair Management: One rail Right;Step to pattern;Forwards Number of Stairs: 4 General stair comments: VC's for sequencing and general safety. No assist required.  Wheelchair Mobility    Modified Rankin (Stroke Patients Only)       Balance Overall balance assessment: Mild deficits observed, not formally tested;Needs assistance Sitting-balance support: Feet supported Sitting balance-Leahy Scale: Good     Standing balance support: Bilateral upper extremity supported;During functional activity;Reliant on assistive device for balance Standing balance-Leahy Scale: Fair                               Pertinent Vitals/Pain Pain Assessment: No/denies pain    Home Living  Family/patient expects to be discharged to::  Private residence Living Arrangements: Alone;Other (Comment) (plans to d/c home with mother) Available Help at Discharge: Family (Has help from mom (56 y/o.), pt states she is in good health/mobility) Type of Home: House Home Access: Stairs to enter Entrance Stairs-Rails: None Entrance Stairs-Number of Steps: 4 Alternate Level Stairs-Number of Steps: 14 Home Layout: Two level;Able to live on main level with bedroom/bathroom Home Equipment: Conservation officer, nature (2 wheels);BSC/3in1;Shower seat Additional Comments: has shower chair    Prior Function Prior Level of Function : Independent/Modified Independent             Mobility Comments: uses RW at baseline ADLs Comments: states mom will be doing all the driving     Hand Dominance   Dominant Hand: Right    Extremity/Trunk Assessment   Upper Extremity Assessment Upper Extremity Assessment: Defer to OT evaluation    Lower Extremity Assessment Lower Extremity Assessment: Overall WFL for tasks assessed    Cervical / Trunk Assessment Cervical / Trunk Assessment: Back Surgery  Communication   Communication: No difficulties  Cognition Arousal/Alertness: Awake/alert Behavior During Therapy: Flat affect;WFL for tasks assessed/performed Overall Cognitive Status: Within Functional Limits for tasks assessed                                          General Comments      Exercises     Assessment/Plan    PT Assessment Patient needs continued PT services  PT Problem List Decreased strength;Decreased activity tolerance;Decreased balance;Decreased mobility;Decreased knowledge of use of DME;Decreased safety awareness;Decreased knowledge of precautions;Pain       PT Treatment Interventions DME instruction;Gait training;Stair training;Functional mobility training;Therapeutic activities;Therapeutic exercise;Neuromuscular re-education;Patient/family education    PT Goals (Current goals can be found in the Care Plan  section)  Acute Rehab PT Goals Patient Stated Goal: Home today to mother's house PT Goal Formulation: With patient/family Time For Goal Achievement: 06/24/21 Potential to Achieve Goals: Good    Frequency Min 5X/week   Barriers to discharge        Co-evaluation               AM-PAC PT "6 Clicks" Mobility  Outcome Measure Help needed turning from your back to your side while in a flat bed without using bedrails?: A Little Help needed moving from lying on your back to sitting on the side of a flat bed without using bedrails?: A Little Help needed moving to and from a bed to a chair (including a wheelchair)?: A Little Help needed standing up from a chair using your arms (e.g., wheelchair or bedside chair)?: A Little Help needed to walk in hospital room?: A Little Help needed climbing 3-5 steps with a railing? : A Little 6 Click Score: 18    End of Session Equipment Utilized During Treatment: Gait belt;Back brace Activity Tolerance: Patient tolerated treatment well Patient left: in bed;with call bell/phone within reach;with family/visitor present (Pt sitting EOB) Nurse Communication: Mobility status PT Visit Diagnosis: Unsteadiness on feet (R26.81);Pain Pain - part of body:  (bed)    Time: 4081-4481 PT Time Calculation (min) (ACUTE ONLY): 38 min   Charges:   PT Evaluation $PT Eval Low Complexity: 1 Low PT Treatments $Gait Training: 23-37 mins        Rolinda Roan, PT, DPT Acute Rehabilitation Services Pager: [phone] Office: [phone]   Thelma Comp 06/17/2021, 11:19 AM

## 2021-06-18 NOTE — Discharge Summary (Signed)
Patient ID: BLADEN UMAR MRN: [id number] DOB/AGE: 09/20/1960 60 y.o.  Admit date: 06/16/2021 Discharge date: 06/17/2021  Admission Diagnoses:  Active Problems:   S/P lumbar fusion   Discharge Diagnoses:  Active Problems:   S/P lumbar fusion  status post Procedure(s): OBLIQUE LUMBAR INTERBODY FUSION 1 LEVEL WITH PERCUTANEOUS SCREWS(OLIF L4-5 WITH POSTERIOR SPINAL FUSION INTERBODY) ABDOMINAL EXPOSURE  Past Medical History:  Diagnosis Date   Arthritis    Bell's palsy    left eye droops a little   CAD (coronary artery disease)    Cancer ([place]) 2012   Mantle Cell Cancer- S/P stem cell transplant   CKD (chronic kidney disease) stage 3, GFR 30-59 ml/min ([place])    per lov with pcp,  last seen at [facility] january 2020 per patient, stable per patient    Diabetes mellitus without complication ([place])    hx of lost weight due to cancer not on medications since 2014    Dysrhythmia    atrial fib and atrial flutter, no repeat of dysrhymia since ablation in 2018 ,    Eye injury    left eye hit by a wood chip early june 2020, lov for f/u with opthamologist 6-17 , drained a pustule,  progress notes mention eye gradually improving with application of antibiotic  eye drops    History of chemotherapy last tx 2014   History of diabetes mellitus NONE SINCE 2012 CANCER   History of splenectomy 01/30/2011   Hyperlipemia    Hypertension    Neuromuscular disorder ([place])    peripheral neuropathy   Neuropathy    right leg due to football injury   Polysubstance abuse ([place])    Sinus bradycardia     Surgeries: Procedure(s): OBLIQUE LUMBAR INTERBODY FUSION 1 LEVEL WITH PERCUTANEOUS SCREWS(OLIF L4-5 WITH POSTERIOR SPINAL FUSION INTERBODY) ABDOMINAL EXPOSURE on 06/16/2021   Consultants: Treatment Team:  Marty Heck, MD  Discharged Condition: Improved  Hospital Course: CODIE KROGH is an 60 y.o. male who was admitted 06/16/2021 for operative treatment of Degenerative  spondylolisthesis L4-5 with lumbar spinal stenosis and radiculopathy. Patient failed conservative treatments (please see the history and physical for the specifics) and had severe unremitting pain that affects sleep, daily activities and work/hobbies. After pre-op clearance, the patient was taken to the operating room on 06/16/2021 and underwent  Procedure(s): OBLIQUE LUMBAR INTERBODY FUSION 1 LEVEL WITH PERCUTANEOUS SCREWS(OLIF L4-5 WITH POSTERIOR SPINAL FUSION INTERBODY) ABDOMINAL EXPOSURE.    Patient was given perioperative antibiotics:  Anti-infectives (From admission, onward)    Start     Dose/Rate Route Frequency Ordered Stop   06/16/21 1600  ceFAZolin (ANCEF) IVPB 1 g/50 mL premix        1 g 100 mL/hr over 30 Minutes Intravenous Every 8 hours 06/16/21 1345 06/16/21 2307   06/16/21 0633  vancomycin (VANCOCIN) IVPB 1000 mg/200 mL premix        1,000 mg 200 mL/hr over 60 Minutes Intravenous 60 min pre-op 06/16/21 3419 06/16/21 0816        Patient was given sequential compression devices and early ambulation to prevent DVT.   Patient benefited maximally from hospital stay and there were no complications. At the time of discharge, the patient was urinating/moving their bowels without difficulty, tolerating a regular diet, pain is controlled with oral pain medications and they have been cleared by PT/OT.   Recent vital signs: No data found.   Recent laboratory studies: No results for input(s): WBC, HGB, HCT, PLT, NA, K, CL, CO2, BUN,  CREATININE, GLUCOSE, INR, CALCIUM in the last 72 hours.  Invalid input(s): PT, 2   Discharge Medications:   Allergies as of 06/17/2021       Reactions   Oxycodone Itching, Palpitations        Medication List     STOP taking these medications    acetaminophen 325 MG tablet Commonly known as: TYLENOL   diclofenac Sodium 1 % Gel Commonly known as: VOLTAREN   HYDROmorphone 4 MG tablet Commonly known as: DILAUDID   penicillin v potassium  500 MG tablet Commonly known as: VEETID       TAKE these medications    amLODipine 10 MG tablet Commonly known as: NORVASC Take 1 tablet (10 mg total) by mouth every morning. What changed: when to take this   atorvastatin 20 MG tablet Commonly known as: LIPITOR Take 20 mg by mouth daily.   dorzolamide 2 % ophthalmic solution Commonly known as: TRUSOPT Place 1 drop into the left eye 2 (two) times daily.   gabapentin 300 MG capsule Commonly known as: NEURONTIN Take 300-600 mg by mouth See admin instructions. Taking 1 capsule (300mg ) in the Am & PM and 2 capsules ( 600mg ) at bedtime   Immune Enhance Tabs Take 1 tablet by mouth daily.   lisinopril 5 MG tablet Commonly known as: ZESTRIL Take 5 mg by mouth daily.   methocarbamol 500 MG tablet Commonly known as: Robaxin Take 1 tablet (500 mg total) by mouth every 8 (eight) hours as needed for up to 5 days for muscle spasms.   ondansetron 4 MG tablet Commonly known as: Zofran Take 1 tablet (4 mg total) by mouth every 8 (eight) hours as needed for nausea or vomiting.   oxyCODONE-acetaminophen 10-325 MG tablet Commonly known as: Percocet Take 1 tablet by mouth every 6 (six) hours as needed for up to 5 days for pain.   pantoprazole 40 MG tablet Commonly known as: PROTONIX Take 40 mg by mouth daily.   pyridOXINE 100 MG tablet Commonly known as: VITAMIN B-6 Take 100 mg by mouth daily.   timolol 0.5 % ophthalmic solution Commonly known as: TIMOPTIC Place 1 drop into the left eye 2 (two) times daily.        Diagnostic Studies: DG Lumbar Spine 2-3 Views  Result Date: 06/16/2021 CLINICAL DATA:  L4-5 discectomy and fusion procedure EXAM: LUMBAR SPINE - 2-3 VIEW COMPARISON:  None FINDINGS: C-arm images show discectomy and fusion. Interbody spacer well positioned. Pedicle screws and posterior rods in place. There is fixed anterolisthesis of approximately 4-5 mm. IMPRESSION: Discectomy and fusion at L4-5 with fixed  anterolisthesis of 4-5 mm. Electronically Signed   By: [name] M.D.   On: 06/16/2021 13:32   DG C-Arm 1-60 Min-No Report  Result Date: 06/16/2021 Fluoroscopy was utilized by the requesting physician.  No radiographic interpretation.   DG C-Arm 1-60 Min-No Report  Result Date: 06/16/2021 Fluoroscopy was utilized by the requesting physician.  No radiographic interpretation.   DG C-Arm 1-60 Min-No Report  Result Date: 06/16/2021 Fluoroscopy was utilized by the requesting physician.  No radiographic interpretation.   DG C-Arm 1-60 Min-No Report  Result Date: 06/16/2021 Fluoroscopy was utilized by the requesting physician.  No radiographic interpretation.   MYOCARDIAL PERFUSION IMAGING  Result Date: 05/21/2021   The study is normal. The study is low risk.   No ST deviation was noted.   Left ventricular function is normal. Nuclear stress EF: 58 %. The left ventricular ejection fraction is normal (55-65%). End  diastolic cavity size is normal. End systolic cavity size is normal.   Prior study not available for comparison. Normal stress nuclear with apical thinning but no ischemia; EF 58 with normal wall motion.   ECHOCARDIOGRAM COMPLETE  Result Date: 05/21/2021    ECHOCARDIOGRAM REPORT   Patient Name:   WILLEY DUE Date of Exam: 05/21/2021 Medical Rec #:  [id number]       Height:       72.0 in Accession #:    [id number]      Weight:       206.0 lb Date of Birth:  [birth year]        BSA:          2.157 m Patient Age:    2 years        BP:           137/74 mmHg Patient Gender: M               HR:           44 bpm. Exam Location:  [facility] Procedure: 2D Echo, Cardiac Doppler and Color Doppler Indications:    H73.42 Chronic systolic (congestive) heart failure  History:        Patient has prior history of Echocardiogram examinations, most                 recent 10/18/2014. CAD, Arrythmias:Atrial Flutter; Risk                 Factors:Hypertension, Dyslipidemia and Diabetes. Bell's palsy.                  Chronic kidney disease. Lymphoma. Bradycardia.  Sonographer:    Diamond Nickel RCS Referring Phys: [place]  1. Left ventricular ejection fraction, by estimation, is 45%. The left ventricle has mildly decreased function. The left ventricle demonstrates global hypokinesis. There is mild concentric left ventricular hypertrophy. Left ventricular diastolic parameters are consistent with Grade I diastolic dysfunction (impaired relaxation).  2. Study done in sinus rhythm.  3. The aortic valve is tricuspid. There is mild thickening of the aortic valve. Aortic valve regurgitation is mild and central in nature. No aortic stenosis is present.  4. Aortic dilatation noted. There is mild dilatation of the ascending aorta, measuring 44 mm.  5. Right ventricular systolic function is normal. The right ventricular size is normal. There is normal pulmonary artery systolic pressure.  6. The mitral valve is normal in structure. No evidence of mitral valve regurgitation. No evidence of mitral stenosis. Comparison(s): A prior study was performed on 10/18/2014. Similar regurgitation; ascending aorta not well visualized in 2016 study. FINDINGS  Left Ventricle: Left ventricular ejection fraction, by estimation, is 45%. The left ventricle has mildly decreased function. The left ventricle demonstrates global hypokinesis. The left ventricular internal cavity size was normal in size. There is mild concentric left ventricular hypertrophy. Left ventricular diastolic parameters are consistent with Grade I diastolic dysfunction (impaired relaxation). Right Ventricle: The right ventricular size is normal. No increase in right ventricular wall thickness. Right ventricular systolic function is normal. There is normal pulmonary artery systolic pressure. The tricuspid regurgitant velocity is 2.45 m/s, and  with an assumed right atrial pressure of 3 mmHg, the estimated right ventricular systolic pressure is 87.6 mmHg.  Left Atrium: Left atrial size was normal in size. Right Atrium: Right atrial size was normal in size. Pericardium: There is no evidence of pericardial effusion. Mitral Valve: The mitral valve is normal  in structure. No evidence of mitral valve regurgitation. No evidence of mitral valve stenosis. Tricuspid Valve: The tricuspid valve is normal in structure. Tricuspid valve regurgitation is mild. Aortic Valve: The aortic valve is tricuspid. There is mild thickening of the aortic valve. Aortic valve regurgitation is mild. Aortic regurgitation PHT measures 1511 msec. No aortic stenosis is present. Pulmonic Valve: The pulmonic valve was normal in structure. Pulmonic valve regurgitation is trivial. No evidence of pulmonic stenosis. Aorta: The aortic root is normal in size and structure and aortic dilatation noted. There is mild dilatation of the ascending aorta, measuring 44 mm. IAS/Shunts: No atrial level shunt detected by color flow Doppler.  LEFT VENTRICLE PLAX 2D LVIDd:         4.90 cm   Diastology LVIDs:         3.10 cm   LV e' medial:    7.08 cm/s LV PW:         1.30 cm   LV E/e' medial:  9.7 LV IVS:        1.30 cm   LV e' lateral:   12.00 cm/s LVOT diam:     2.15 cm   LV E/e' lateral: 5.7 LV SV:         77 LV SV Index:   36 LVOT Area:     3.63 cm  RIGHT VENTRICLE RV Basal diam:  3.10 cm RV S prime:     12 cm/s TAPSE (M-mode): 2.3 cm RVSP:           27.0 mmHg LEFT ATRIUM             Index        RIGHT ATRIUM           Index LA diam:        4.50 cm 2.09 cm/m   RA Pressure: 3.00 mmHg LA Vol (A2C):   42.1 ml 19.51 ml/m  RA Area:     11.70 cm LA Vol (A4C):   54.4 ml 25.21 ml/m  RA Volume:   20.50 ml  9.50 ml/m LA Biplane Vol: 48.1 ml 22.29 ml/m  AORTIC VALVE LVOT Vmax:   98.20 cm/s LVOT Vmean:  58.500 cm/s LVOT VTI:    0.211 m AI PHT:      1511 msec  AORTA Ao Root diam: 3.80 cm MITRAL VALVE               TRICUSPID VALVE MV Area (PHT): 3.27 cm    TR Peak grad:   24.0 mmHg MV Decel Time: 232 msec    TR Vmax:         245.00 cm/s MV E velocity: 68.40 cm/s  Estimated RAP:  3.00 mmHg MV A velocity: 49.30 cm/s  RVSP:           27.0 mmHg MV E/A ratio:  1.39                            SHUNTS                            Systemic VTI:  0.21 m                            Systemic Diam: 2.15 cm Rudean Haskell MD Electronically signed by Rudean Haskell MD Signature Date/Time: 05/21/2021/11:27:22 AM    Final  DG OR LOCAL ABDOMEN  Result Date: 06/16/2021 CLINICAL DATA:  Final instrumentation count.  L4-5 surgical fusion. EXAM: OR LOCAL ABDOMEN COMPARISON:  November 30, 2010. FINDINGS: Status post surgical anterior fusion of L4-5. Surgical staples are seen over the L2 vertebral body and sacrum. No other radiopaque foreign body is noted. IMPRESSION: Status post surgical anterior fusion of L4-5. Surgical staples are noted. No other radiopaque foreign body is noted. These results were called by telephone at the time of interpretation on 06/16/2021 at 11:03 am to provider [facility] in OR 4, who verbally acknowledged these results. Electronically Signed   By: Marijo Conception M.D.   On: 06/16/2021 11:04    Discharge Instructions     Incentive spirometry RT   Complete by: As directed         Follow-up Information     Melina Schools, MD. Schedule an appointment as soon as possible for a visit in 2 week(s).   Specialty: Orthopedic Surgery Why: As needed, If symptoms worsen, For suture removal Contact information: [address] [place] [address] [phone]                 Discharge Plan:  discharge to home  Disposition: stable    Signed: Charlyne Petrin for [facility] PA-C [facility] [phone] 06/18/2021, 8:03 AM

## 2021-06-24 MED FILL — Sodium Chloride IV Soln 0.9%: INTRAVENOUS | Qty: 1000 | Status: AC

## 2021-06-24 MED FILL — Heparin Sodium (Porcine) Inj 1000 Unit/ML: INTRAMUSCULAR | Qty: 30 | Status: AC

## 2021-08-16 ENCOUNTER — Other Ambulatory Visit: Payer: Self-pay

## 2021-08-16 ENCOUNTER — Emergency Department (HOSPITAL_COMMUNITY): Payer: Medicare HMO

## 2021-08-16 ENCOUNTER — Emergency Department (HOSPITAL_COMMUNITY)
Admission: EM | Admit: 2021-08-16 | Discharge: 2021-08-16 | Discharge status: Against Medical Advice | Payer: Medicare HMO | Attending: Emergency Medicine | Admitting: Emergency Medicine

## 2021-08-16 DIAGNOSIS — Z5321 Procedure and treatment not carried out due to patient leaving prior to being seen by health care provider: Secondary | ICD-10-CM | POA: Diagnosis not present

## 2021-08-16 DIAGNOSIS — W01198A Fall on same level from slipping, tripping and stumbling with subsequent striking against other object, initial encounter: Secondary | ICD-10-CM | POA: Diagnosis not present

## 2021-08-16 DIAGNOSIS — R519 Headache, unspecified: Secondary | ICD-10-CM | POA: Diagnosis present

## 2021-08-16 NOTE — ED Notes (Signed)
Pt stated he was leaving facility due to the long wait time stated he was going to another ER.

## 2021-08-16 NOTE — ED Provider Triage Note (Signed)
Emergency Medicine Provider Triage Evaluation Note  Cory Marks , a 61 y.o. male  was evaluated in triage.  Pt complains of a fall.  Fell on Friday, struck head on cement.  No LOC.  States some ongoing headaches since then as well.  Had back surgery in November, has been doing well since that time.  No new numbness/weakness.  Review of Systems  Positive: headache Negative: fever  Physical Exam  BP 90/64 (BP Location: Left Arm)    Pulse 60    Temp 98.4 F (36.9 C)    Resp 17    Ht 6' (1.829 m)    Wt 97.5 kg    SpO2 93%    BMI 29.16 kg/m   Gen:   Awake, no distress   Resp:  Normal effort  MSK:   Moves extremities without difficulty  Other:  No open wound or hematoma noted to scalp  Medical Decision Making  Medically screening exam initiated at 4:29 AM.  Appropriate orders placed.  Cory Marks was informed that the remainder of the evaluation will be completed by another provider, this initial triage assessment does not replace that evaluation, and the importance of remaining in the ED until their evaluation is complete.  Fall w/head trauma.  Ongoing headaches.  AAOx3 currently without focal deficits.  Will check CT head/c-spine.   Larene Pickett, PA-C 08/16/21 0430

## 2021-08-16 NOTE — ED Triage Notes (Signed)
Pt reports he recently had back surgery and fell on Friday and hit his head on the cement. Pt denies LOC. Pt also reports a headache.

## 2021-10-17 ENCOUNTER — Emergency Department (HOSPITAL_COMMUNITY)
Admission: EM | Admit: 2021-10-17 | Discharge: 2021-10-17 | Disposition: A | Discharge status: Home / Self Care | Payer: Medicare HMO | Attending: Emergency Medicine | Admitting: Emergency Medicine

## 2021-10-17 ENCOUNTER — Emergency Department (HOSPITAL_COMMUNITY): Payer: Medicare HMO

## 2021-10-17 ENCOUNTER — Other Ambulatory Visit: Payer: Self-pay

## 2021-10-17 DIAGNOSIS — Z79899 Other long term (current) drug therapy: Secondary | ICD-10-CM | POA: Insufficient documentation

## 2021-10-17 DIAGNOSIS — W108XXA Fall (on) (from) other stairs and steps, initial encounter: Secondary | ICD-10-CM | POA: Insufficient documentation

## 2021-10-17 DIAGNOSIS — S0993XA Unspecified injury of face, initial encounter: Secondary | ICD-10-CM | POA: Diagnosis present

## 2021-10-17 DIAGNOSIS — W19XXXA Unspecified fall, initial encounter: Secondary | ICD-10-CM

## 2021-10-17 DIAGNOSIS — S0083XA Contusion of other part of head, initial encounter: Secondary | ICD-10-CM | POA: Diagnosis not present

## 2021-10-17 NOTE — ED Notes (Signed)
Pt dc'd with security back to pts room he was visiting prior to fall  ?

## 2021-10-17 NOTE — ED Provider Notes (Signed)
?[facility] ?Provider Note ? ? ?CSN: [id number] ?Arrival date & time: 10/17/21  1526 ? ?  ? ?History ? ?Chief Complaint  ?Patient presents with  ? Fall  ? ? ?Cory Marks is a 61 y.o. male. ? ? ?Fall ?Pertinent negatives include no abdominal pain and no shortness of breath. Patient presents after fall.  Was visiting the hospital because he is a friend.  [address] and hit his face.  No loss conscious.  Pain in left eye left jaw.  Swelling of lip.  No neck pain.  No other injury. ? ?  ? ?Home Medications ?Prior to Admission medications   ?Medication Sig Start Date End Date Taking? Authorizing Provider  ?amLODipine (NORVASC) 10 MG tablet Take 1 tablet (10 mg total) by mouth every morning. ?Patient taking differently: Take 10 mg by mouth daily. 07/04/17   Dorena Dew, FNP  ?atorvastatin (LIPITOR) 20 MG tablet Take 20 mg by mouth daily. 07/10/19   [provider]  ?dorzolamide (TRUSOPT) 2 % ophthalmic solution Place 1 drop into the left eye 2 (two) times daily. 03/03/21   [provider]  ?gabapentin (NEURONTIN) 300 MG capsule Take 300-600 mg by mouth See admin instructions. Taking 1 capsule ('300mg'$ ) in the Am & PM and 2 capsules ( '600mg'$ ) at bedtime 03/12/19   [provider]  ?lisinopril (ZESTRIL) 5 MG tablet Take 5 mg by mouth daily.  05/08/19   [provider]  ?Nutritional Supplements (IMMUNE ENHANCE) TABS Take 1 tablet by mouth daily.    [provider]  ?ondansetron (ZOFRAN) 4 MG tablet Take 1 tablet (4 mg total) by mouth every 8 (eight) hours as needed for nausea or vomiting. 06/16/21   Melina Schools, MD  ?pantoprazole (PROTONIX) 40 MG tablet Take 40 mg by mouth daily. 05/07/21   [provider]  ?pyridOXINE (VITAMIN B-6) 100 MG tablet Take 100 mg by mouth daily.    [provider]  ?timolol (TIMOPTIC) 0.5 % ophthalmic solution Place 1 drop into the left eye 2 (two) times daily.  11/08/19   [provider]  ?    ? ?Allergies    ?Oxycodone   ? ?Review of Systems   ?Review of Systems  ?Constitutional:  Negative for appetite change.  ?Respiratory:  Negative for shortness of breath.   ?Gastrointestinal:  Negative for abdominal pain.  ?Musculoskeletal:   ?     Face and jaw pain after fall.  ?Neurological:  Negative for weakness.  ? ?Physical Exam ?Updated Vital Signs ?BP (!) 131/93   Pulse (!) 53   Temp 98.4 ?F (36.9 ?C)   Resp 15   SpO2 100%  ?Physical Exam ?Vitals reviewed.  ?HENT:  ?   Head:  ?   Comments: Left-sided periorbital hematoma.  Eye movement intact.  Tenderness to left anterior mandible.  No definite deformity.  Teeth intact.  Some popping with opening closing of jaw. ?Musculoskeletal:  ?   Cervical back: No tenderness.  ?Skin: ?   General: Skin is warm.  ?Neurological:  ?   Mental Status: He is alert and oriented to person, place, and time.  ? ? ?ED Results / Procedures / Treatments   ?Labs ?(all labs ordered are listed, but only abnormal results are displayed) ?Labs Reviewed - No data to display ? ?EKG ?None ? ?Radiology ?CT HEAD WO CONTRAST (5MM) ? ?Result Date: 10/17/2021 ?CLINICAL DATA:  Mechanical fall, left facial pain. EXAM: CT HEAD WITHOUT CONTRAST CT MAXILLOFACIAL WITHOUT CONTRAST  TECHNIQUE: Multidetector CT imaging of the head and maxillofacial structures were performed using the standard protocol without intravenous contrast. Multiplanar CT image reconstructions of the maxillofacial structures were also generated. RADIATION DOSE REDUCTION: This exam was performed according to the departmental dose-optimization program which includes automated exposure control, adjustment of the mA and/or kV according to patient size and/or use of iterative reconstruction technique. COMPARISON:  08/16/2021 FINDINGS: CT HEAD FINDINGS Brain: Periventricular white matter and corona radiata hypodensities favor chronic ischemic microvascular white matter disease. Otherwise, the brainstem, cerebellum, cerebral peduncles,  thalamus, basal ganglia, basilar cisterns, and ventricular system appear within normal limits. No intracranial hemorrhage, mass lesion, or acute CVA. Vascular: Unremarkable Skull: Unremarkable Other: No supplemental non-categorized findings. CT MAXILLOFACIAL FINDINGS Osseous: Prominent tooth decay of the remaining right mandibular molars, including large cavities and periapical lucencies. No facial fracture identified. Orbits: Unremarkable Sinuses: Unremarkable Soft tissues: Left supraorbital and periorbital soft tissue swelling. Suspected swelling of the left lower lip, correlate with visual inspection. IMPRESSION: 1. No facial fracture or acute intracranial findings. 2. Left periorbital soft tissue swelling and left lower lip soft tissue swelling. 3. Periventricular white matter and corona radiata hypodensities favor chronic ischemic microvascular white matter disease. 4. Prominent tooth decay of the remaining right mandibular molars. Electronically Signed   By: Van Clines M.D.   On: 10/17/2021 16:27  ? ?CT Maxillofacial Wo Contrast ? ?Result Date: 10/17/2021 ?CLINICAL DATA:  Mechanical fall, left facial pain. EXAM: CT HEAD WITHOUT CONTRAST CT MAXILLOFACIAL WITHOUT CONTRAST TECHNIQUE: Multidetector CT imaging of the head and maxillofacial structures were performed using the standard protocol without intravenous contrast. Multiplanar CT image reconstructions of the maxillofacial structures were also generated. RADIATION DOSE REDUCTION: This exam was performed according to the departmental dose-optimization program which includes automated exposure control, adjustment of the mA and/or kV according to patient size and/or use of iterative reconstruction technique. COMPARISON:  08/16/2021 FINDINGS: CT HEAD FINDINGS Brain: Periventricular white matter and corona radiata hypodensities favor chronic ischemic microvascular white matter disease. Otherwise, the brainstem, cerebellum, cerebral peduncles, thalamus,  basal ganglia, basilar cisterns, and ventricular system appear within normal limits. No intracranial hemorrhage, mass lesion, or acute CVA. Vascular: Unremarkable Skull: Unremarkable Other: No supplemental non-categorized findings. CT MAXILLOFACIAL FINDINGS Osseous: Prominent tooth decay of the remaining right mandibular molars, including large cavities and periapical lucencies. No facial fracture identified. Orbits: Unremarkable Sinuses: Unremarkable Soft tissues: Left supraorbital and periorbital soft tissue swelling. Suspected swelling of the left lower lip, correlate with visual inspection. IMPRESSION: 1. No facial fracture or acute intracranial findings. 2. Left periorbital soft tissue swelling and left lower lip soft tissue swelling. 3. Periventricular white matter and corona radiata hypodensities favor chronic ischemic microvascular white matter disease. 4. Prominent tooth decay of the remaining right mandibular molars. Electronically Signed   By: Van Clines M.D.   On: 10/17/2021 16:27   ? ?Procedures ?Procedures  ? ? ?Medications Ordered in ED ?Medications - No data to display ? ?ED Course/ Medical Decision Making/ A&P ?  ?                        ?Medical Decision Making ?Amount and/or Complexity of Data Reviewed ?Radiology: ordered. ? ? ?Patient presents after mechanical fall with a head injury.  Differential diagnosis with head injury is broad and includes life-threatening conditions such as an cranial hemorrhage or facial fractures.  Loss of balance and tripped visiting someone in the hospital.  Hit head.  Hematoma over left face and  tenderness to left jaw.  CT head and CT maxillofacial done and both independently interpreted.  Reassuring without fracture.  Will discharge home.  Head injury instructions given.  Follow-up with PCP as needed ? ? ? ? ? ? ? ?Final Clinical Impression(s) / ED Diagnoses ?Final diagnoses:  ?Fall, initial encounter  ?Contusion of face, initial encounter  ? ? ?Rx / DC  Orders ?ED Discharge Orders   ? ? None  ? ?  ? ? ?  ?Davonna Belling, MD ?10/17/21 1650 ? ?

## 2021-10-17 NOTE — ED Triage Notes (Signed)
Pt c/o mechanical fall while visiting pt upstairs. -LOC, -thinners. C/o pain to L eye, lip, hematoma/swelling to both.  ?

## 2021-10-25 ENCOUNTER — Ambulatory Visit
Admission: RE | Admit: 2021-10-25 | Discharge: 2021-10-25 | Disposition: A | Discharge status: Home / Self Care | Payer: Medicare HMO | Source: Ambulatory Visit | Attending: Family Medicine | Admitting: Family Medicine

## 2021-10-25 ENCOUNTER — Other Ambulatory Visit: Payer: Self-pay | Admitting: Family Medicine

## 2021-10-25 DIAGNOSIS — R0781 Pleurodynia: Secondary | ICD-10-CM

## 2022-02-05 DIAGNOSIS — Z923 Personal history of irradiation: Secondary | ICD-10-CM

## 2022-02-05 HISTORY — DX: Personal history of irradiation: Z92.3

## 2022-03-25 ENCOUNTER — Other Ambulatory Visit: Payer: Self-pay | Admitting: Family Medicine

## 2022-03-25 DIAGNOSIS — R1909 Other intra-abdominal and pelvic swelling, mass and lump: Secondary | ICD-10-CM

## 2022-03-29 ENCOUNTER — Ambulatory Visit
Admission: RE | Admit: 2022-03-29 | Discharge: 2022-03-29 | Disposition: A | Discharge status: Home / Self Care | Payer: Medicare HMO | Source: Ambulatory Visit | Attending: Family Medicine | Admitting: Family Medicine

## 2022-03-29 DIAGNOSIS — R1909 Other intra-abdominal and pelvic swelling, mass and lump: Secondary | ICD-10-CM

## 2022-04-21 ENCOUNTER — Telehealth: Payer: Self-pay | Admitting: *Deleted

## 2022-04-21 ENCOUNTER — Ambulatory Visit: Payer: Self-pay | Admitting: Surgery

## 2022-04-21 NOTE — Telephone Encounter (Signed)
   Pre-operative Risk Assessment    Patient Name: Cory Marks  DOB: [birth year] MRN: [id number]      Request for Surgical Clearance    Procedure:   HERNIA REPAIR WITH MESH  Date of Surgery:  Clearance TBD                                 Surgeon:  DR. PAUL STECHSCHULTE Surgeon's Group or Practice Name:  [facility] Phone number:  [phone] Fax number:  [phone]   Type of Clearance Requested:   - Medical    Type of Anesthesia:  General    Additional requests/questions:    Astrid Divine   04/21/2022, 3:43 PM

## 2022-04-22 NOTE — Telephone Encounter (Signed)
Primary Cardiologist:Jonathan Gwenlyn Found, MD   Preoperative team, please contact this patient and set up a phone call appointment for further preoperative risk assessment. Please obtain consent and complete medication review. Thank you for your help.   I confirm that guidance regarding antiplatelet and oral anticoagulation therapy has been completed and, if necessary, noted below.   Emmaline Life, NP-C  04/22/2022, 2:45 PM [address], [address] Office [phone] Fax [phone]

## 2022-04-25 ENCOUNTER — Other Ambulatory Visit: Payer: Self-pay | Admitting: Family Medicine

## 2022-04-25 DIAGNOSIS — K4090 Unilateral inguinal hernia, without obstruction or gangrene, not specified as recurrent: Secondary | ICD-10-CM

## 2022-04-26 NOTE — Telephone Encounter (Signed)
I tried to call the pt and his answering machine came and I started to leave a message, when machine recording came on and said message cancelled and hung up the phone. Could not leave a message for the pt to call the office for a tele pre op appt.   I will update the requesting office as well that we are trying to reach the pt, in hopes they s/w the pt they can tell him to call our office to set up tele appt

## 2022-04-27 ENCOUNTER — Inpatient Hospital Stay: Admission: RE | Admit: 2022-04-27 | Payer: Medicare HMO | Source: Ambulatory Visit

## 2022-04-28 NOTE — Telephone Encounter (Signed)
Call placed to pt regarding need for a tele visit, asn't able to leave a message.

## 2022-04-29 ENCOUNTER — Encounter: Payer: Self-pay | Admitting: *Deleted

## 2022-04-29 NOTE — Telephone Encounter (Signed)
Tried to reach pt , however unable to leave message . Our office has tried x 3 to reach pt to schedule a tele pre op appt. Will update the requesting office.   Will remove from the pre op call back pool. Will send a letter to the pt to call the office for a tele pre op appt.

## 2022-06-07 ENCOUNTER — Ambulatory Visit: Payer: Medicare HMO | Attending: Cardiovascular Disease | Admitting: Cardiovascular Disease

## 2022-06-07 ENCOUNTER — Encounter: Payer: Self-pay | Admitting: Cardiovascular Disease

## 2022-06-07 VITALS — BP 92/70 | HR 47 | Ht 72.0 in | Wt 207.0 lb

## 2022-06-07 DIAGNOSIS — Z87898 Personal history of other specified conditions: Secondary | ICD-10-CM | POA: Diagnosis not present

## 2022-06-07 DIAGNOSIS — I4892 Unspecified atrial flutter: Secondary | ICD-10-CM | POA: Diagnosis not present

## 2022-06-07 DIAGNOSIS — E782 Mixed hyperlipidemia: Secondary | ICD-10-CM | POA: Diagnosis not present

## 2022-06-07 DIAGNOSIS — I10 Essential (primary) hypertension: Secondary | ICD-10-CM | POA: Diagnosis not present

## 2022-06-07 DIAGNOSIS — I428 Other cardiomyopathies: Secondary | ICD-10-CM | POA: Insufficient documentation

## 2022-06-07 NOTE — Patient Instructions (Signed)
Medication Instructions:  Your physician recommends that you continue on your current medications as directed. Please refer to the Current Medication list given to you today.  *If you need a refill on your cardiac medications before your next appointment, please call your pharmacy*   Follow-Up: At [facility], you and your health needs are our priority.  As part of our continuing mission to provide you with exceptional heart care, we have created designated Provider Care Teams.  These Care Teams include your primary Cardiologist (physician) and Advanced Practice Providers (APPs -  Physician Assistants and Nurse Practitioners) who all work together to provide you with the care you need, when you need it.  We recommend signing up for the patient portal called "MyChart".  Sign up information is provided on this After Visit Summary.  MyChart is used to connect with patients for Virtual Visits (Telemedicine).  Patients are able to view lab/test results, encounter notes, upcoming appointments, etc.  Non-urgent messages can be sent to your provider as well.   To learn more about what you can do with MyChart, go to [email or web address].    Your next appointment:   12 month(s)  The format for your next appointment:   In Person  Provider:   Jonathan Berry, MD   

## 2022-06-07 NOTE — Assessment & Plan Note (Signed)
History of essential hypertension blood pressure measured today at 92/70.  He is on amlodipine and lisinopril.  He does not feel symptomatic from the relatively low blood pressure and said that he blew late leaves yesterday without symptoms.

## 2022-06-07 NOTE — Assessment & Plan Note (Signed)
Ejection fraction is in the 45% range by 2D echo performed 05/21/2021.  This is similar to the EF seen on echo in 2016.  Patient has had chemotherapy in the past.  He had a negative Myoview stress test done for preoperative clearance for his orthopedic surgery 05/21/2021.  He is on an ACE inhibitor but not on a beta-blocker because of persistent bradycardia.  He is asymptomatic.

## 2022-06-07 NOTE — Progress Notes (Signed)
06/07/2022 [name]   [birth year]  [id number]  Primary Physician Cipriano Mile, NP Primary Cardiologist: Lorretta Harp MD Lupe Carney, [place]  HPI:  Cory Marks is a 61 y.o.  mildly overweight married African-American male father of 59, grandfather 1 grandchild referred by Dr. [name], orthopedic surgeon, for preoperative clearance before elective back surgery.  He is currently disabled because of mantle cell cancer.  I last saw him in the office 05/07/2021.  His cardiac risk factor profile is notable for treated hypertension, hyperlipidemia.  He was diabetic in the past.  His father did have a myocardial infarction.  He is never had a heart attack or stroke.  He denies chest pain or shortness of breath.  He did have a flutter ablation by Dr. Curt Bears 09/12/2016.  He had a Myoview stress test performed 05/21/2021 which was nonischemic and low risk and a 2D echo performed 05/21/2021 that revealed EF of 45% with mild global hypokinesia, normal valvular structure and function unchanged from the echo performed in 2016.  He unfortunately was diagnosed with recurrence of his mantle cell lymphoma and underwent radiation therapy.  He had his orthopedic surgery performed successfully by Dr. [name] 06/16/2021 and he has recovered from this.   Current Meds  Medication Sig   amLODipine (NORVASC) 10 MG tablet Take 1 tablet (10 mg total) by mouth every morning. (Patient taking differently: Take 10 mg by mouth daily.)   atorvastatin (LIPITOR) 20 MG tablet Take 20 mg by mouth daily.   dorzolamide (TRUSOPT) 2 % ophthalmic solution Place 1 drop into the left eye 2 (two) times daily.   gabapentin (NEURONTIN) 300 MG capsule Take 300-600 mg by mouth See admin instructions. Taking 1 capsule ('300mg'$ ) in the Am & PM and 2 capsules ( '600mg'$ ) at bedtime   lisinopril (ZESTRIL) 5 MG tablet Take 5 mg by mouth daily.    Nutritional Supplements (IMMUNE ENHANCE) TABS Take 1 tablet by mouth daily.   ondansetron  (ZOFRAN) 4 MG tablet Take 1 tablet (4 mg total) by mouth every 8 (eight) hours as needed for nausea or vomiting.   pantoprazole (PROTONIX) 40 MG tablet Take 40 mg by mouth daily.   pyridOXINE (VITAMIN B-6) 100 MG tablet Take 100 mg by mouth daily.   timolol (TIMOPTIC) 0.5 % ophthalmic solution Place 1 drop into the left eye 2 (two) times daily.      Allergies  Allergen Reactions   Oxycodone Itching and Palpitations    Social History   Socioeconomic History   Marital status: Married    Spouse name: Not on file   Number of children: Not on file   Years of education: Not on file   Highest education level: Not on file  Occupational History   Not on file  Tobacco Use   Smoking status: Never   Smokeless tobacco: Never  Vaping Use   Vaping Use: Never used  Substance and Sexual Activity   Alcohol use: Yes    Alcohol/week: 6.0 standard drinks of alcohol    Types: 6 Cans of beer per week    Comment: occasional    Drug use: Yes    Frequency: 3.0 times per week    Types: Marijuana   Sexual activity: Yes    Partners: Female  Other Topics Concern   Not on file  Social History Narrative   Not on file   Social Determinants of Health   Financial Resource Strain: Not on file  Food Insecurity: Not  on file  Transportation Needs: Not on file  Physical Activity: Not on file  Stress: Not on file  Social Connections: Not on file  Intimate Partner Violence: Not on file     Review of Systems: General: negative for chills, fever, night sweats or weight changes.  Cardiovascular: negative for chest pain, dyspnea on exertion, edema, orthopnea, palpitations, paroxysmal nocturnal dyspnea or shortness of breath Dermatological: negative for rash Respiratory: negative for cough or wheezing Urologic: negative for hematuria Abdominal: negative for nausea, vomiting, diarrhea, bright red blood per rectum, melena, or hematemesis Neurologic: negative for visual changes, syncope, or dizziness All  other systems reviewed and are otherwise negative except as noted above.    Blood pressure 92/70, pulse (!) 47, height 6' (1.829 m), weight 207 lb (93.9 kg).  General appearance: alert and no distress Neck: no adenopathy, no carotid bruit, no JVD, supple, symmetrical, trachea midline, and thyroid not enlarged, symmetric, no tenderness/mass/nodules Lungs: clear to auscultation bilaterally Heart: regular rate and rhythm, S1, S2 normal, no murmur, click, rub or gallop Extremities: extremities normal, atraumatic, no cyanosis or edema Pulses: 2+ and symmetric Skin: Skin color, texture, turgor normal. No rashes or lesions Neurologic: Grossly normal  EKG sinus bradycardia at 47 with first-degree AV block.  I personally reviewed this EKG.  ASSESSMENT AND PLAN:   HTN (hypertension) History of essential hypertension blood pressure measured today at 92/70.  He is on amlodipine and lisinopril.  He does not feel symptomatic from the relatively low blood pressure and said that he blew late leaves yesterday without symptoms.  History of bradycardia Chronic and asymptomatic.  He is not on any negative chronotropic drugs.  Hyperlipidemia History of hyperlipidemia on statin therapy with lipid profile performed 05/21/2021 revealing total cholesterol 161, LDL of 90 and HDL 45.  Atrial flutter (HCC) History of atrial flutter ablation by Dr. Curt Bears 09/12/2016.  He remains in sinus rhythm.  Nonischemic cardiomyopathy (HCC) Ejection fraction is in the 45% range by 2D echo performed 05/21/2021.  This is similar to the EF seen on echo in 2016.  Patient has had chemotherapy in the past.  He had a negative Myoview stress test done for preoperative clearance for his orthopedic surgery 05/21/2021.  He is on an ACE inhibitor but not on a beta-blocker because of persistent bradycardia.  He is asymptomatic.     Lorretta Harp MD FACP,FACC,FAHA, [facility] 06/07/2022 8:18 AM

## 2022-06-07 NOTE — Assessment & Plan Note (Signed)
History of atrial flutter ablation by Dr. Curt Bears 09/12/2016.  He remains in sinus rhythm.

## 2022-06-07 NOTE — Assessment & Plan Note (Signed)
Chronic and asymptomatic.  He is not on any negative chronotropic drugs.

## 2022-06-07 NOTE — Assessment & Plan Note (Signed)
History of hyperlipidemia on statin therapy with lipid profile performed 05/21/2021 revealing total cholesterol 161, LDL of 90 and HDL 45.

## 2022-06-10 ENCOUNTER — Ambulatory Visit: Payer: Self-pay | Admitting: Surgery

## 2022-06-10 NOTE — Progress Notes (Signed)
Surgical Instructions    Your procedure is scheduled on Friday, 06/17/22.  Report to [facility] Main Entrance "A" at 11:00 A.M., then check in with the Admitting office.  Call this number if you have problems the morning of surgery:  [phone]   If you have any questions prior to your surgery date call [phone]: Open Monday-Friday 8am-4pm If you experience any cold or flu symptoms such as cough, fever, chills, shortness of breath, etc. between now and your scheduled surgery, please notify us at the above number     Remember:  Do not eat after midnight the night before your surgery  You may drink clear liquids until 10:00am the morning of your surgery.   Clear liquids allowed are: Water, Non-Citrus Juices (without pulp), Carbonated Beverages, Clear Tea, Black Coffee ONLY (NO MILK, CREAM OR POWDERED CREAMER of any kind), and Gatorade    Take these medicines the morning of surgery with A SIP OF WATER:  amLODipine (NORVASC)  atorvastatin (LIPITOR)  dorzolamide (TRUSOPT)  gabapentin (NEURONTIN)  pantoprazole (PROTONIX)  timolol (TIMOPTIC)    As of today, STOP taking any Aspirin (unless otherwise instructed by your surgeon) Aleve, Naproxen, Ibuprofen, Motrin, Advil, Goody's, BC's, all herbal medications, fish oil, and all vitamins.    HOW TO MANAGE YOUR DIABETES BEFORE AND AFTER SURGERY  Why is it important to control my blood sugar before and after surgery? Improving blood sugar levels before and after surgery helps healing and can limit problems. A way of improving blood sugar control is eating a healthy diet by:  Eating less sugar and carbohydrates  Increasing activity/exercise  Talking with your doctor about reaching your blood sugar goals High blood sugars (greater than 180 mg/dL) can raise your risk of infections and slow your recovery, so you will need to focus on controlling your diabetes during the weeks before surgery. Make sure that the doctor who takes care of  your diabetes knows about your planned surgery including the date and location.  How do I manage my blood sugar before surgery? Check your blood sugar at least 4 times a day, starting 2 days before surgery, to make sure that the level is not too high or low.  Check your blood sugar the morning of your surgery when you wake up and every 2 hours until you get to the Short Stay unit.  If your blood sugar is less than 70 mg/dL, you will need to treat for low blood sugar: Do not take insulin. Treat a low blood sugar (less than 70 mg/dL) with  cup of clear juice (cranberry or apple), 4 glucose tablets, OR glucose gel. Recheck blood sugar in 15 minutes after treatment (to make sure it is greater than 70 mg/dL). If your blood sugar is not greater than 70 mg/dL on recheck, call [phone] for further instructions. Report your blood sugar to the short stay nurse when you get to Short Stay.  If you are admitted to the hospital after surgery: Your blood sugar will be checked by the staff and you will probably be given insulin after surgery (instead of oral diabetes medicines) to make sure you have good blood sugar levels. The goal for blood sugar control after surgery is 80-180 mg/dL.            Do not wear jewelry or makeup. Do not wear lotions, powders, cologne or deodorant. Do not shave 48 hours prior to surgery.   Do not bring valuables to the hospital. Do not wear nail polish, gel  polish, artificial nails, or any other type of covering on natural nails (fingers and toes) If you have artificial nails or gel coating that need to be removed by a nail salon, please have this removed prior to surgery. Artificial nails or gel coating may interfere with anesthesia's ability to adequately monitor your vital signs.  [facility] is not responsible for any belongings or valuables.    Do NOT Smoke (Tobacco/Vaping)  24 hours prior to your procedure  If you use a CPAP at night, you may bring your mask  for your overnight stay.   Contacts, glasses, hearing aids, dentures or partials may not be worn into surgery, please bring cases for these belongings   For patients admitted to the hospital, discharge time will be determined by your treatment team.   Patients discharged the day of surgery will not be allowed to drive home, and someone needs to stay with them for 24 hours.   SURGICAL WAITING ROOM VISITATION Patients having surgery or a procedure may have no more than 2 support people in the waiting area - these visitors may rotate.   Children under the age of 74 must have an adult with them who is not the patient. If the patient needs to stay at the hospital during part of their recovery, the visitor guidelines for inpatient rooms apply. Pre-op nurse will coordinate an appropriate time for 1 support person to accompany patient in pre-op.  This support person may not rotate.   Please refer to [email or web address] for the visitor guidelines for Inpatients (after your surgery is over and you are in a regular room).    Special instructions:    Oral Hygiene is also important to reduce your risk of infection.  Remember - BRUSH YOUR TEETH THE MORNING OF SURGERY WITH YOUR REGULAR TOOTHPASTE   [place]- Preparing For Surgery  Before surgery, you can play an important role. Because skin is not sterile, your skin needs to be as free of germs as possible. You can reduce the number of germs on your skin by washing with CHG (chlorahexidine gluconate) Soap before surgery.  CHG is an antiseptic cleaner which kills germs and bonds with the skin to continue killing germs even after washing.     Please do not use if you have an allergy to CHG or antibacterial soaps. If your skin becomes reddened/irritated stop using the CHG.  Do not shave (including legs and underarms) for at least 48 hours prior to first CHG shower. It is OK to shave your face.  Please  follow these instructions carefully.     Shower the NIGHT BEFORE SURGERY and the MORNING OF SURGERY with CHG Soap.   If you chose to wash your hair, wash your hair first as usual with your normal shampoo. After you shampoo, rinse your hair and body thoroughly to remove the shampoo.  Then [employer] and genitals (private parts) with your normal soap and rinse thoroughly to remove soap.  After that Use CHG Soap as you would any other liquid soap. You can apply CHG directly to the skin and wash gently with a scrungie or a clean washcloth.   Apply the CHG Soap to your body ONLY FROM THE NECK DOWN.  Do not use on open wounds or open sores. Avoid contact with your eyes, ears, mouth and genitals (private parts). Wash Face and genitals (private parts)  with your normal soap.   Wash thoroughly, paying special attention to the area where your surgery will be  performed.  Thoroughly rinse your body with warm water from the neck down.  DO NOT shower/wash with your normal soap after using and rinsing off the CHG Soap.  Pat yourself dry with a CLEAN TOWEL.  Wear CLEAN PAJAMAS to bed the night before surgery  Place CLEAN SHEETS on your bed the night before your surgery  DO NOT SLEEP WITH PETS.   Day of Surgery: Take a shower with CHG soap. Wear Clean/Comfortable clothing the morning of surgery Do not apply any deodorants/lotions.   Remember to brush your teeth WITH YOUR REGULAR TOOTHPASTE.    If you received a COVID test during your pre-op visit, it is requested that you wear a mask when out in public, stay away from anyone that may not be feeling well, and notify your surgeon if you develop symptoms. If you have been in contact with anyone that has tested positive in the last 10 days, please notify your surgeon.    Please read over the following fact sheets that you were given.

## 2022-06-13 ENCOUNTER — Encounter (HOSPITAL_COMMUNITY)
Admission: RE | Admit: 2022-06-13 | Discharge: 2022-06-13 | Disposition: A | Discharge status: Home / Self Care | Payer: Medicare HMO | Source: Ambulatory Visit | Attending: Surgery | Admitting: Surgery

## 2022-06-13 ENCOUNTER — Encounter (HOSPITAL_COMMUNITY): Payer: Self-pay

## 2022-06-13 ENCOUNTER — Other Ambulatory Visit: Payer: Self-pay

## 2022-06-13 VITALS — BP 144/70 | HR 70 | Temp 97.7°F | Resp 18 | Ht 72.0 in | Wt 202.0 lb

## 2022-06-13 DIAGNOSIS — Z01812 Encounter for preprocedural laboratory examination: Secondary | ICD-10-CM | POA: Diagnosis present

## 2022-06-13 DIAGNOSIS — Z9081 Acquired absence of spleen: Secondary | ICD-10-CM

## 2022-06-13 DIAGNOSIS — I10 Essential (primary) hypertension: Secondary | ICD-10-CM | POA: Diagnosis not present

## 2022-06-13 LAB — COMPREHENSIVE METABOLIC PANEL
ALT: 21 U/L (ref 0–44)
AST: 28 U/L (ref 15–41)
Albumin: 4 g/dL (ref 3.5–5.0)
Alkaline Phosphatase: 48 U/L (ref 38–126)
Anion gap: 7 (ref 5–15)
BUN: 19 mg/dL (ref 8–23)
CO2: 29 mmol/L (ref 22–32)
Calcium: 9.6 mg/dL (ref 8.9–10.3)
Chloride: 104 mmol/L (ref 98–111)
Creatinine, Ser: 1.45 mg/dL — ABNORMAL HIGH (ref 0.61–1.24)
GFR, Estimated: 54 mL/min — ABNORMAL LOW (ref >60)
Glucose, Bld: 95 mg/dL (ref 70–99)
Potassium: 4.3 mmol/L (ref 3.5–5.1)
Sodium: 140 mmol/L (ref 135–145)
Total Bilirubin: 0.6 mg/dL (ref 0.3–1.2)
Total Protein: 6.4 g/dL — ABNORMAL LOW (ref 6.5–8.1)

## 2022-06-13 LAB — CBC
HCT: 45.2 % (ref 39.0–52.0)
Hemoglobin: 15.1 g/dL (ref 13.0–17.0)
MCH: 32.4 pg (ref 26.0–34.0)
MCHC: 33.4 g/dL (ref 30.0–36.0)
MCV: 97 fL (ref 80.0–100.0)
Platelets: 176 10*3/uL (ref 150–400)
RBC: 4.66 MIL/uL (ref 4.22–5.81)
RDW: 14.5 % (ref 11.5–15.5)
WBC: 4.5 10*3/uL (ref 4.0–10.5)
nRBC: 0 % (ref 0.0–0.2)

## 2022-06-13 NOTE — Progress Notes (Signed)
PCP -  was Cipriano Mile, NP  / Dr. Brigitte Pulse with [facility] Cardiologist - Dr. Gwenlyn Found  PPM/ICD - Denies Device Orders -  Rep Notified -   Chest x-ray - NI EKG - 06/07/22 Stress Test - 05/21/21 ECHO - 05/21/21 Cardiac Cath - Denies  Sleep Study - No  No longer Diabetic  Blood Thinner Instructions:Denies Aspirin Instructions:Denies  ERAS Protcol -Yes   COVID TEST- NI   Anesthesia review: Yes cardiac history  Patient denies shortness of breath, fever, cough and chest pain at PAT appointment   All instructions explained to the patient, with a verbal understanding of the material. Patient agrees to go over the instructions while at home for a better understanding. The opportunity to ask questions was provided.

## 2022-06-14 NOTE — Anesthesia Preprocedure Evaluation (Addendum)
Anesthesia Evaluation  Patient identified by MRN, date of birth, ID band Patient awake    Reviewed: Allergy & Precautions, NPO status , Patient's Chart, lab work & pertinent test results  History of Anesthesia Complications Negative for: history of anesthetic complications  Airway Mallampati: III  TM Distance: >3 FB Neck ROM: Full    Dental  (+) Edentulous Upper,    Pulmonary neg shortness of breath, neg sleep apnea, neg COPD, Recent URI  (dry cough, clear rhinorrhea x2 weeks, no fever), Not current smoker   Pulmonary exam normal breath sounds clear to auscultation       Cardiovascular hypertension (amlodipine, lisinopril), Pt. 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  Left eye droop from Bell's palsy  Reproductive/Obstetrics                             Anesthesia Physical Anesthesia Plan  ASA: 3  Anesthesia Plan: General   Post-op Pain Management:    Induction: Intravenous  PONV Risk Score and Plan: 2 and Ondansetron and Dexamethasone  Airway Management Planned: Oral ETT  Additional Equipment:   Intra-op Plan:   Post-operative Plan: Extubation in OR  Informed Consent: I have reviewed the patients History and Physical, chart, labs and discussed the procedure including the risks, benefits and  alternatives for the proposed anesthesia with the patient or authorized representative who has indicated his/her understanding and acceptance.     Dental advisory given  Plan Discussed with:   Anesthesia Plan Comments: (PAT note by Karoline Caldwell, PA-C: Follows with cardiology for history of HTN, asymptomatic bradycardia, HLD, atrial flutter s/p ablation in 2018, nonischemic cardiomyopathy. He previously had a Myoview stress test performed 05/21/2021 which was nonischemic and low risk and a 2D echo performed 05/21/2021 that revealed EF of 45% with mild global hypokinesia, normal valvular structure and function unchanged from the echo performed in 2016.  Last seen by Dr. Gwenlyn Found 06/07/2022.  Noted to be stable at that time, no changes in management, 63-monthfollow-up recommended.  [facility].  He has previously undergone chemotherapy and autologous PBSCT in 2012.  He had a recurrence in March 2023 presenting with a hard palate mass.  He received x-ray therapy in June 2023.  Posttreatment PET/CT 05/04/2022 showed good response to treatment.  No evidence of systemic progression.  Oncologist Dr. VCassell Clementcommented on upcoming surgery in telephone encounter 05/10/2022 stating, "I called and talked to the patient. Conveyed to him that Dr. HYsidro Evertdoes not recommend further RT at this time. We will continue to monitor the area that was treated. If there is a recurrence in future, he may receive more RT to the same site in future. We will see him as scheduled in December for follow-up. Okay for him to proceed with hernia  surgery."  History of PVD (right great toe amputation 10/20/14, right 2nd toe amputation 12/25/14, right transmetatarsal amputation 12/20/19, right foot 5th ray amputation 01/22/20)   History of splenic rupture s/p emergent open splenectomy 2012.  Preop labs reviewed, creatinine mildly elevated 1.45, otherwise unremarkable.  EKG 06/07/2022: Sinus  bradycardia with first-degree AV block.  Rate 47.  Nuclear stress 05/21/2021:  The study is normal. The study is low risk.  No ST deviation was noted.  Left ventricular function is normal. Nuclear stress EF: 58 %. The left ventricular ejection fraction is normal (55-65%). End diastolic cavity size is normal. End systolic cavity size is normal.  Prior study not available for comparison.  Normal stress nuclear with apical thinning but no ischemia; EF 58 with normal wall motion.  TTE 05/21/2021:  The study is normal. The study is low risk.  No ST deviation was noted.  Left ventricular function is normal. Nuclear stress EF: 58 %. The left ventricular ejection fraction is normal (55-65%). End diastolic cavity size is normal. End systolic cavity size is normal.  Prior study not available for comparison.  Normal stress nuclear with apical thinning but no ischemia; EF 58 with normal wall motion.  Risks of general anesthesia discussed including, but not limited to, sore throat, hoarse voice, chipped/damaged teeth, injury to vocal cords, nausea and vomiting, allergic reactions, lung infection, heart attack, stroke, and death. All questions answered.   )        Anesthesia Quick Evaluation

## 2022-06-14 NOTE — Progress Notes (Signed)
Anesthesia Chart Review:  Follows with cardiology for history of HTN, asymptomatic bradycardia, HLD, atrial flutter s/p ablation in 2018, nonischemic cardiomyopathy. He previously had a Myoview stress test performed 05/21/2021 which was nonischemic and low risk and a 2D echo performed 05/21/2021 that revealed EF of 45% with mild global hypokinesia, normal valvular structure and function unchanged from the echo performed in 2016.  Last seen by Dr. Gwenlyn Found 06/07/2022.  Noted to be stable at that time, no changes in management, 33-monthfollow-up recommended.  [facility]  He has previously undergone chemotherapy and autologous PBSCT in 2012.  He had a recurrence in March 2023 presenting with a hard palate mass.  He received x-ray therapy in June 2023.  Posttreatment PET/CT 05/04/2022 showed good response to treatment.  No evidence of systemic progression.  Oncologist Dr. VCassell Clementcommented on upcoming surgery in telephone encounter 05/10/2022 stating, "I called and talked to the patient. Conveyed to him that Dr. HYsidro Evertdoes not recommend further RT at this time. We will continue to monitor the area that was treated. If there is a recurrence in future, he may receive more RT to the same site in future. We will see him as scheduled in December for follow-up. Okay for him to proceed with hernia surgery."  History of PVD (right great toe amputation 10/20/14, right 2nd toe amputation 12/25/14, right transmetatarsal amputation 12/20/19, right foot 5th ray amputation 01/22/20)   History of splenic rupture s/p emergent open splenectomy 2012.  Preop labs reviewed, creatinine mildly elevated 1.45, otherwise unremarkable.  EKG 06/07/2022: Sinus bradycardia with first-degree AV block.  Rate 47.  Nuclear stress 05/21/2021:   The study is normal. The study is low risk.   No ST deviation was noted.   Left ventricular function is normal. Nuclear stress EF: 58 %. The left  ventricular ejection fraction is normal (55-65%). End diastolic cavity size is normal. End systolic cavity size is normal.   Prior study not available for comparison.   Normal stress nuclear with apical thinning but no ischemia; EF 58 with normal wall motion.  TTE 05/21/2021:   The study is normal. The study is low risk.   No ST deviation was noted.   Left ventricular function is normal. Nuclear stress EF: 58 %. The left ventricular ejection fraction is normal (55-65%). End diastolic cavity size is normal. End systolic cavity size is normal.   Prior study not available for comparison.   Normal stress nuclear with apical thinning but no ischemia; EF 58 with normal wall motion.    Cory MustyMEdward White HospitalShort Stay Center/Anesthesiology Phone [phone]/02/2022 1:52 PM

## 2022-06-17 ENCOUNTER — Encounter (HOSPITAL_COMMUNITY): Payer: Self-pay | Admitting: Surgery

## 2022-06-17 ENCOUNTER — Ambulatory Visit (HOSPITAL_COMMUNITY): Payer: Medicare HMO | Admitting: Physician Assistant

## 2022-06-17 ENCOUNTER — Other Ambulatory Visit: Payer: Self-pay

## 2022-06-17 ENCOUNTER — Ambulatory Visit (HOSPITAL_COMMUNITY)
Admission: RE | Admit: 2022-06-17 | Discharge: 2022-06-17 | Disposition: A | Discharge status: Home / Self Care | Payer: Medicare HMO | Attending: Surgery | Admitting: Surgery

## 2022-06-17 ENCOUNTER — Encounter (HOSPITAL_COMMUNITY)
Admission: RE | Disposition: A | Discharge status: Home / Self Care | Payer: Self-pay | Source: Home / Self Care | Attending: Surgery

## 2022-06-17 ENCOUNTER — Ambulatory Visit (HOSPITAL_BASED_OUTPATIENT_CLINIC_OR_DEPARTMENT_OTHER): Payer: Medicare HMO | Admitting: Anesthesiology

## 2022-06-17 DIAGNOSIS — E1122 Type 2 diabetes mellitus with diabetic chronic kidney disease: Secondary | ICD-10-CM

## 2022-06-17 DIAGNOSIS — K219 Gastro-esophageal reflux disease without esophagitis: Secondary | ICD-10-CM | POA: Diagnosis not present

## 2022-06-17 DIAGNOSIS — N189 Chronic kidney disease, unspecified: Secondary | ICD-10-CM | POA: Insufficient documentation

## 2022-06-17 DIAGNOSIS — I130 Hypertensive heart and chronic kidney disease with heart failure and stage 1 through stage 4 chronic kidney disease, or unspecified chronic kidney disease: Secondary | ICD-10-CM | POA: Insufficient documentation

## 2022-06-17 DIAGNOSIS — I2510 Atherosclerotic heart disease of native coronary artery without angina pectoris: Secondary | ICD-10-CM

## 2022-06-17 DIAGNOSIS — K432 Incisional hernia without obstruction or gangrene: Secondary | ICD-10-CM | POA: Diagnosis not present

## 2022-06-17 DIAGNOSIS — K4090 Unilateral inguinal hernia, without obstruction or gangrene, not specified as recurrent: Secondary | ICD-10-CM | POA: Diagnosis present

## 2022-06-17 DIAGNOSIS — I4891 Unspecified atrial fibrillation: Secondary | ICD-10-CM | POA: Insufficient documentation

## 2022-06-17 DIAGNOSIS — Z8572 Personal history of non-Hodgkin lymphomas: Secondary | ICD-10-CM | POA: Diagnosis not present

## 2022-06-17 DIAGNOSIS — Z923 Personal history of irradiation: Secondary | ICD-10-CM | POA: Diagnosis not present

## 2022-06-17 DIAGNOSIS — I509 Heart failure, unspecified: Secondary | ICD-10-CM | POA: Diagnosis not present

## 2022-06-17 DIAGNOSIS — Z85819 Personal history of malignant neoplasm of unspecified site of lip, oral cavity, and pharynx: Secondary | ICD-10-CM | POA: Diagnosis not present

## 2022-06-17 DIAGNOSIS — Z9484 Stem cells transplant status: Secondary | ICD-10-CM | POA: Insufficient documentation

## 2022-06-17 HISTORY — PX: INSERTION OF MESH: SHX5868

## 2022-06-17 HISTORY — PX: INGUINAL HERNIA REPAIR: SHX194

## 2022-06-17 LAB — GLUCOSE, CAPILLARY
Glucose-Capillary: 77 mg/dL (ref 70–99)
Glucose-Capillary: 105 mg/dL — ABNORMAL HIGH (ref 70–99)

## 2022-06-17 SURGERY — REPAIR, HERNIA, INGUINAL, ADULT
Anesthesia: General | Site: Groin | Laterality: Left

## 2022-06-17 MED ORDER — SUGAMMADEX SODIUM 200 MG/2ML IV SOLN
INTRAVENOUS | Status: DC | PRN
  Administered 2022-06-17: 200 mg via INTRAVENOUS

## 2022-06-17 MED ORDER — ACETAMINOPHEN 500 MG PO TABS
1000.0000 mg | ORAL_TABLET | ORAL | Status: AC
  Administered 2022-06-17: 1000 mg via ORAL
  Filled 2022-06-17: qty 2

## 2022-06-17 MED ORDER — OXYCODONE HCL 5 MG/5ML PO SOLN
5.0000 mg | ORAL | Status: DC | PRN
  Administered 2022-06-17: 5 mg via ORAL

## 2022-06-17 MED ORDER — 0.9 % SODIUM CHLORIDE (POUR BTL) OPTIME
TOPICAL | Status: DC | PRN
  Administered 2022-06-17: 1000 mL

## 2022-06-17 MED ORDER — FENTANYL CITRATE (PF) 250 MCG/5ML IJ SOLN
INTRAMUSCULAR | Status: AC
  Filled 2022-06-17: qty 5

## 2022-06-17 MED ORDER — FENTANYL CITRATE (PF) 100 MCG/2ML IJ SOLN
INTRAMUSCULAR | Status: AC
  Filled 2022-06-17: qty 2

## 2022-06-17 MED ORDER — CHLORHEXIDINE GLUCONATE CLOTH 2 % EX PADS: 6.0000 | MEDICATED_PAD | Freq: Once | CUTANEOUS | Status: DC

## 2022-06-17 MED ORDER — GABAPENTIN 300 MG PO CAPS
300.0000 mg | ORAL_CAPSULE | ORAL | Status: DC
  Filled 2022-06-17: qty 1

## 2022-06-17 MED ORDER — TRAMADOL HCL 50 MG PO TABS: 50.0000 mg | ORAL_TABLET | Freq: Four times a day (QID) | ORAL | 0 refills | Status: AC | PRN

## 2022-06-17 MED ORDER — CHLORHEXIDINE GLUCONATE 0.12 % MT SOLN
15.0000 mL | Freq: Once | OROMUCOSAL | Status: AC
  Administered 2022-06-17: 15 mL via OROMUCOSAL
  Filled 2022-06-17: qty 15

## 2022-06-17 MED ORDER — EPHEDRINE SULFATE-NACL 50-0.9 MG/10ML-% IV SOSY
PREFILLED_SYRINGE | INTRAVENOUS | Status: DC | PRN
  Administered 2022-06-17: 5 mg via INTRAVENOUS
  Administered 2022-06-17: 7.5 mg via INTRAVENOUS

## 2022-06-17 MED ORDER — BUPIVACAINE-EPINEPHRINE (PF) 0.25% -1:200000 IJ SOLN
INTRAMUSCULAR | Status: AC
  Filled 2022-06-17: qty 30

## 2022-06-17 MED ORDER — MIDAZOLAM HCL 2 MG/2ML IJ SOLN
INTRAMUSCULAR | Status: AC
  Filled 2022-06-17: qty 2

## 2022-06-17 MED ORDER — PROPOFOL 10 MG/ML IV BOLUS
INTRAVENOUS | Status: DC | PRN
  Administered 2022-06-17: 180 mg via INTRAVENOUS

## 2022-06-17 MED ORDER — BUPIVACAINE LIPOSOME 1.3 % IJ SUSP
20.0000 mL | Freq: Once | INTRAMUSCULAR | Status: DC
  Filled 2022-06-17: qty 20

## 2022-06-17 MED ORDER — CEFAZOLIN SODIUM-DEXTROSE 2-4 GM/100ML-% IV SOLN
2.0000 g | INTRAVENOUS | Status: AC
  Administered 2022-06-17: 2 g via INTRAVENOUS
  Filled 2022-06-17: qty 100

## 2022-06-17 MED ORDER — FENTANYL CITRATE (PF) 250 MCG/5ML IJ SOLN
INTRAMUSCULAR | Status: DC | PRN
  Administered 2022-06-17: 100 ug via INTRAVENOUS

## 2022-06-17 MED ORDER — MIDAZOLAM HCL 5 MG/5ML IJ SOLN
INTRAMUSCULAR | Status: DC | PRN
  Administered 2022-06-17: 1 mg via INTRAVENOUS

## 2022-06-17 MED ORDER — INSULIN ASPART 100 UNIT/ML IJ SOLN: 0.0000 [IU] | INTRAMUSCULAR | Status: DC | PRN

## 2022-06-17 MED ORDER — LACTATED RINGERS IV SOLN: INTRAVENOUS | Status: DC

## 2022-06-17 MED ORDER — FENTANYL CITRATE (PF) 100 MCG/2ML IJ SOLN
25.0000 ug | INTRAMUSCULAR | Status: DC | PRN
  Administered 2022-06-17 (×3): 50 ug via INTRAVENOUS

## 2022-06-17 MED ORDER — BUPIVACAINE-EPINEPHRINE (PF) 0.25% -1:200000 IJ SOLN
INTRAMUSCULAR | Status: DC | PRN
  Administered 2022-06-17: 30 mL

## 2022-06-17 MED ORDER — ONDANSETRON HCL 4 MG/2ML IJ SOLN
INTRAMUSCULAR | Status: DC | PRN
  Administered 2022-06-17: 4 mg via INTRAVENOUS

## 2022-06-17 MED ORDER — ORAL CARE MOUTH RINSE: 15.0000 mL | Freq: Once | OROMUCOSAL | Status: AC

## 2022-06-17 MED ORDER — PROMETHAZINE HCL 25 MG/ML IJ SOLN: 6.2500 mg | INTRAMUSCULAR | Status: DC | PRN

## 2022-06-17 MED ORDER — ROCURONIUM BROMIDE 10 MG/ML (PF) SYRINGE
PREFILLED_SYRINGE | INTRAVENOUS | Status: DC | PRN
  Administered 2022-06-17: 50 mg via INTRAVENOUS
  Administered 2022-06-17: 20 mg via INTRAVENOUS

## 2022-06-17 MED ORDER — OXYCODONE HCL 5 MG/5ML PO SOLN
ORAL | Status: AC
  Filled 2022-06-17: qty 5

## 2022-06-17 MED ORDER — LIDOCAINE 2% (20 MG/ML) 5 ML SYRINGE
INTRAMUSCULAR | Status: DC | PRN
  Administered 2022-06-17: 100 mg via INTRAVENOUS

## 2022-06-17 SURGICAL SUPPLY — 27 items
BAG COUNTER SPONGE SURGICOUNT (BAG) IMPLANT
BLADE CLIPPER SURG (BLADE) IMPLANT
CHLORAPREP W/TINT 26 (MISCELLANEOUS) ×1 IMPLANT
COVER SURGICAL LIGHT HANDLE (MISCELLANEOUS) ×1 IMPLANT
DERMABOND ADVANCED .7 DNX12 (GAUZE/BANDAGES/DRESSINGS) ×1 IMPLANT
DRAPE LAPAROTOMY TRNSV 102X78 (DRAPES) ×1 IMPLANT
ELECT REM PT RETURN 9FT ADLT (ELECTROSURGICAL) ×1
ELECTRODE REM PT RTRN 9FT ADLT (ELECTROSURGICAL) ×1 IMPLANT
GLOVE SURG SIGNA 7.5 PF LTX (GLOVE) ×1 IMPLANT
GOWN STRL REUS W/ TWL LRG LVL3 (GOWN DISPOSABLE) ×1 IMPLANT
GOWN STRL REUS W/ TWL XL LVL3 (GOWN DISPOSABLE) ×1 IMPLANT
GOWN STRL REUS W/TWL LRG LVL3 (GOWN DISPOSABLE) ×1
GOWN STRL REUS W/TWL XL LVL3 (GOWN DISPOSABLE) ×1
KIT BASIN OR (CUSTOM PROCEDURE TRAY) ×1 IMPLANT
KIT TURNOVER KIT B (KITS) ×1 IMPLANT
MESH HERNIA 3X6 (Mesh General) IMPLANT
NDL 22X1.5 STRL (OR ONLY) (MISCELLANEOUS) ×1 IMPLANT
NEEDLE 22X1.5 STRL (OR ONLY) (MISCELLANEOUS) ×1 IMPLANT
NS IRRIG 1000ML POUR BTL (IV SOLUTION) ×1 IMPLANT
PACK GENERAL/GYN (CUSTOM PROCEDURE TRAY) ×1 IMPLANT
PAD ARMBOARD 7.5X6 YLW CONV (MISCELLANEOUS) ×1 IMPLANT
PENCIL SMOKE EVACUATOR (MISCELLANEOUS) ×1 IMPLANT
SUT MON AB 4-0 PC3 18 (SUTURE) ×1 IMPLANT
SUT NOVA NAB DX-16 0-1 5-0 T12 (SUTURE) IMPLANT
SUT VIC AB 2-0 SH 18 (SUTURE) ×1 IMPLANT
SYR CONTROL 10ML LL (SYRINGE) ×1 IMPLANT
TOWEL GREEN STERILE (TOWEL DISPOSABLE) ×1 IMPLANT

## 2022-06-17 NOTE — Progress Notes (Signed)
Allergy noted, but patient is ok taking oxycodone.

## 2022-06-17 NOTE — Anesthesia Procedure Notes (Signed)
Procedure Name: Intubation Date/Time: 06/17/2022 1:02 PM  Performed by: Georgia Duff, CRNAPre-anesthesia Checklist: Patient identified, Emergency Drugs available, Suction available and Patient being monitored Patient Re-evaluated:Patient Re-evaluated prior to induction Oxygen Delivery Method: Circle System Utilized Preoxygenation: Pre-oxygenation with 100% oxygen Induction Type: IV induction Ventilation: Mask ventilation without difficulty Laryngoscope Size: Miller and 2 Grade View: Grade I Tube type: Oral Tube size: 7.5 mm Number of attempts: 1 Airway Equipment and Method: Stylet and Oral airway Placement Confirmation: ETT inserted through vocal cords under direct vision, positive ETCO2 and breath sounds checked- equal and bilateral Secured at: 21 cm Tube secured with: Tape Dental Injury: Teeth and Oropharynx as per pre-operative assessment

## 2022-06-17 NOTE — Discharge Instructions (Signed)
 GROIN HERNIA REPAIR POST OPERATIVE INSTRUCTIONS  Thinking Clearly  The anesthesia may cause you to feel different for 1 or 2 days. Do not drive, drink alcohol, or make any big decisions for at least 2 days.  Nutrition When you wake up, you will be able to drink small amounts of liquid. If you do not feel sick, you can slowly advance your diet to regular foods. Continue to drink lots of fluids, usually about 8 to 10 glasses per day. Eat a high-fiber diet so you don't strain during bowel movements. High-Fiber Foods Foods high in fiber include beans, bran cereals and whole-grain breads, peas, dried fruit (figs, apricots, and dates), raspberries, blackberries, strawberries, sweet corn, broccoli, baked potatoes with skin, plums, pears, apples, greens, and nuts. Activity Slowly increase your activity. Be sure to get up and walk every hour or so to prevent blood clots. No heavy lifting or strenuous activity for 4 weeks following surgery to prevent hernias at your incision sites or recurrence of your hernia. It is normal to feel tired. You may need more sleep than usual.  Get your rest but make sure to get up and move around frequently to prevent blood clots and pneumonia.  Work and Return to School You can go back to work when you feel well enough. Discuss the timing with your surgeon. You can usually go back to school or work 1 week or less after an laparoscopic or an open repair. If your work requires heavy lifting or strenuous activity you need to be placed on light duty for 4 weeks following surgery. You can return to gym class, sports or other physical activities 4 weeks after surgery.  Wound Care You may experience significant bruising in the groin including into the scrotum in males.  Rest, elevating the groin and scrotum above the level of the heart, ice and compression with tight fitting underwear can help.  Always wash your hands before and after touching near your incision site. Do  not soak in a bathtub until cleared at your follow up appointment. You may take a shower 24 hours after surgery. A small amount of drainage from the incision is normal. If the drainage is thick and yellow or the site is red, you may have an infection, so call your surgeon. If you have a drain in one of your incisions, it will be taken out in office when the drainage stops. Steri-Strips will fall off in 7 to 10 days or they will be removed during your first office visit. If you have dermabond glue covering over the incision, allow the glue to flake off on its own. Protect the new skin, especially from the sun. The sun can burn and cause darker scarring. Your scar will heal in about 4 to 6 weeks and will become softer and continue to fade over the next year.  The cosmetic appearance of the incisions will improve over the course of the first year after surgery. Sensation around your incision will return in a few weeks or months.  Bowel Movements After intestinal surgery, you may have loose watery stools for several days. If watery diarrhea lasts longer than 3 days, contact your surgeon. Pain medication (narcotics) can cause constipation. Increase the fiber in your diet with high-fiber foods if you are constipated. You can take an over the counter stool softener like Colace to avoid constipation.  Additional over the counter medications can also be used if Colace isn't sufficient (for example, Milk of Magnesia or Miralax).    Pain The amount of pain is different for each person. Some people need only 1 to 3 doses of pain control medication, while others need more. Take alternating doses of tylenol and ibuprofen around the clock for the first five days following surgery.  This will provide a baseline of pain control and help with inflammation.  Take the narcotic pain medication in addition if needed for severe pain.  Contact Your Surgeon at [phone], if you have: Pain that will not go away Pain that  gets worse A fever of more than 101F (38.3C) Repeated vomiting Swelling, redness, bleeding, or bad-smelling drainage from your wound site Strong abdominal pain No bowel movement or unable to pass gas for 3 days Watery diarrhea lasting longer than 3 days  Pain Control The goal of pain control is to minimize pain, keep you moving and help you heal. Your surgical team will work with you on your pain plan. Most often a combination of therapies and medications are used to control your pain. You may also be given medication (local anesthetic) at the surgical site. This may help control your pain for several days. Extreme pain puts extra stress on your body at a time when your body needs to focus on healing. Do not wait until your pain has reached a level "10" or is unbearable before telling your doctor or nurse. It is much easier to control pain before it becomes severe. Following a laparoscopic procedure, pain is sometimes felt in the shoulder. This is due to the gas inserted into your abdomen during the procedure. Moving and walking helps to decrease the gas and the right shoulder pain.  Use the guide below for ways to manage your post-operative pain. Learn more by going to facs.org/safepaincontrol.  How Intense Is My Pain Common Therapies to Feel Better       I hardly notice my pain, and it does not interfere with my activities.  I notice my pain and it distracts me, but I can still do activities (sitting up, walking, standing).  Non-Medication Therapies  Ice (in a bag, applied over clothing at the surgical site), elevation, rest, meditation, massage, distraction (music, TV, play) walking and mild exercise Splinting the abdomen with pillows +  Non-Opioid Medications Acetaminophen (Tylenol) Non-steroidal anti-inflammatory drugs (NSAIDS) Aspirin, Ibuprofen (Motrin, Advil) Naproxen (Aleve) Take these as needed, when you feel pain. Both acetaminophen and NSAIDs help to decrease pain  and swelling (inflammation).      My pain is hard to ignore and is more noticeable even when I rest.  My pain interferes with my usual activities.  Non-Medication Therapies  +  Non-Opioid medications  Take on a regular schedule (around-the-clock) instead of as needed. (For example, Tylenol every 6 hours at 9:00 am, 3:00 pm, 9:00 pm, 3:00 am and Motrin every 6 hours at 12:00 am, 6:00 am, 12:00 pm, 6:00 pm)         I am focused on my pain, and I am not doing my daily activities.  I am groaning in pain, and I cannot sleep. I am unable to do anything.  My pain is as bad as it could be, and nothing else matters.  Non-Medication Therapies  +  Around-the-Clock Non-Opioid Medications  +  Short-acting opioids  Opioids should be used with other medications to manage severe pain. Opioids block pain and give a feeling of euphoria (feel high). Addiction, a serious side effect of opioids, is rare with short-term (a few days) use.  Examples of short-acting opioids   include: Tramadol (Ultram), Hydrocodone (Norco, Vicodin), Hydromorphone (Dilaudid), Oxycodone (Oxycontin)     The above directions have been adapted from the American College of [employer]geons Surgical Patient Education Program.  Please refer to the ACS website if needed: [email or web address]   Caral Whan, MD [facility] [address], [address], [place], [place]  [address] ?  [address] [address], [place], [place]   [address] [phone] ? [phone] ? FAX [phone] Web site: [email or web address]  

## 2022-06-17 NOTE — H&P (Signed)
Admitting Physician: Nickola Major Amilliana Hayworth  Service: General surgery  CC: Left inguinal hernia  Subjective   HPI: Cory Marks is an 61 y.o. male who is here for inguinal hernia repair.  Past Medical History:  Diagnosis Date   Arthritis    Bell's palsy    left eye droops a little   CAD (coronary artery disease)    Cancer ([place]) 2012   Mantle Cell Cancer- S/P stem cell transplant   CKD (chronic kidney disease) stage 3, GFR 30-59 ml/min ([place])    per lov with pcp,  last seen at [facility] january 2020 per patient, stable per patient    Diabetes mellitus without complication ([place])    hx of lost weight due to cancer not on medications since 2014    Dysrhythmia    atrial fib and atrial flutter, no repeat of dysrhymia since ablation in 2018 ,    Eye injury    left eye hit by a wood chip early june 2020, lov for f/u with opthamologist 6-17 , drained a pustule,  progress notes mention eye gradually improving with application of antibiotic  eye drops    History of chemotherapy last tx 2014   History of diabetes mellitus NONE SINCE 2012 CANCER   History of radiation therapy 02/2022   Mouth cancer   History of splenectomy 01/30/2011   Hyperlipemia    Hypertension    Neuromuscular disorder ([place])    peripheral neuropathy   Neuropathy    right leg due to football injury   Polysubstance abuse ([place])    Sinus bradycardia     Past Surgical History:  Procedure Laterality Date   A-FLUTTER ABLATION N/A 09/12/2016   Procedure: A-Flutter Ablation;  Surgeon: Will Meredith Leeds, MD;  Location: [facility];  Service: Cardiovascular;  Laterality: N/A;   ABDOMINAL EXPOSURE N/A 06/16/2021   Procedure: ABDOMINAL EXPOSURE;  Surgeon: Marty Heck, MD;  Location: [facility];  Service: Vascular;  Laterality: N/A;   AMPUTATION Right 10/20/2014   Procedure: AMPUTATION RIGHT GREAT TOE;  Surgeon: Gaynelle Arabian, MD;  Location: [facility];  Service: Orthopedics;  Laterality: Right;    AMPUTATION Right 12/25/2014   Procedure: RIGHT 2ND TOE AMPUTATION ;  Surgeon: Wylene Simmer, MD;  Location: [facility];  Service: Orthopedics;  Laterality: Right;   AMPUTATION Right 12/20/2019   Procedure: RIGHT FOOT AMPUTATION TOES;  Surgeon: Newt Minion, MD;  Location: [facility];  Service: Orthopedics;  Laterality: Right;   AMPUTATION Right 01/22/2020   Procedure: RIGHT FOOT 5TH RAY AMPUTATION;  Surgeon: Newt Minion, MD;  Location: [facility];  Service: Orthopedics;  Laterality: Right;   edentulation N/A 2019   prior to transplant   EYE SURGERY Left    caratct   OBLIQUE LUMBAR INTERBODY FUSION 1 LEVEL WITH PERCUTANEOUS SCREWS N/A 06/16/2021   Procedure: OBLIQUE LUMBAR INTERBODY FUSION 1 LEVEL WITH PERCUTANEOUS SCREWS(OLIF L4-5 WITH POSTERIOR SPINAL FUSION INTERBODY);  Surgeon: Melina Schools, MD;  Location: [facility];  Service: Orthopedics;  Laterality: N/A;  tap block with exparel 3 C-Bed   REVERSE SHOULDER ARTHROPLASTY Right 01/31/2019   Procedure: REVERSE SHOULDER ARTHROPLASTY;  Surgeon: Justice Britain, MD;  Location: [facility];  Service: Orthopedics;  Laterality: Right;   right knee surgery Right    nerve graft back of leg   SPLENECTOMY, TOTAL  01/30/2011   for splenic rupture in settin of mantle cell lymphoma   TENOLYSIS Right 12/25/2014   Procedure: RIGHT 3,4,5 FLEXOR TENDON RELEASE ;  Surgeon: Wylene Simmer, MD;  Location: [facility];  Service: Orthopedics;  Laterality: Right;   TOTAL KNEE ARTHROPLASTY Left 01/30/2017   Procedure: LEFT TOTAL KNEE ARTHROPLASTY;  Surgeon: Gaynelle Arabian, MD;  [facility];  Service: Orthopedics;  Laterality: Left;    Family History  Problem Relation Age of Onset   Hypertension Mother    Diabetes Mother    Cancer Father        pancreatic cancer   Diabetes Father    Heart disease Father    Diabetes Sister     Social:  reports that he has never smoked. He has never used smokeless tobacco. He reports current alcohol use of  about 6.0 standard drinks of alcohol per week. He reports current drug use. Frequency: 3.00 times per week. Drug: Marijuana.  Allergies:  Allergies  Allergen Reactions   Oxycodone Itching and Palpitations    Medications: Current Outpatient Medications  Medication Instructions   amLODipine (NORVASC) 10 mg, Oral, Every morning   atorvastatin (LIPITOR) 20 mg, Oral, Daily   dorzolamide (TRUSOPT) 2 % ophthalmic solution 1 drop, Left Eye, 2 times daily   gabapentin (NEURONTIN) 300-600 mg, Oral, See admin instructions, Taking 1 capsule ('300mg'$ ) in the Am & PM and 2 capsules ('600mg'$ ) at bedtime   lisinopril (ZESTRIL) 5 mg, Oral, Daily   Nutritional Supplements (IMMUNE ENHANCE) TABS 1 tablet, Oral, Daily   pantoprazole (PROTONIX) 40 mg, Oral, Daily   pyridOXINE (VITAMIN B6) 100 mg, Oral, Daily   timolol (TIMOPTIC) 0.5 % ophthalmic solution 1 drop, Left Eye, 2 times daily    ROS - all of the below systems have been reviewed with the patient and positives are indicated with bold text General: chills, fever or night sweats Eyes: blurry vision or double vision ENT: epistaxis or sore throat Allergy/Immunology: itchy/watery eyes or nasal congestion Hematologic/Lymphatic: bleeding problems, blood clots or swollen lymph nodes Endocrine: temperature intolerance or unexpected weight changes Breast: new or changing breast lumps or nipple discharge Resp: cough, shortness of breath, or wheezing CV: chest pain or dyspnea on exertion GI: as per HPI GU: dysuria, trouble voiding, or hematuria MSK: joint pain or joint stiffness Neuro: TIA or stroke symptoms Derm: pruritus and skin lesion changes Psych: anxiety and depression  Objective   PE There were no vitals taken for this visit. Constitutional: NAD; conversant; no deformities Eyes: Moist conjunctiva; no lid lag; anicteric; PERRL Neck: Trachea midline; no thyromegaly Lungs: Normal respiratory effort; no tactile fremitus CV: RRR; no palpable  thrills; no pitting edema GI: Abd midline laparotomy incision with small hernia, large left inguinal hernia MSK: Normal range of motion of extremities; no clubbing/cyanosis Psychiatric: Appropriate affect; alert and oriented x3 Lymphatic: No palpable cervical or axillary lymphadenopathy  No results found for this or any previous visit (from the past 24 hour(s)).  Imaging Orders  No imaging studies ordered today     Assessment and Plan   Mr. Hofbauer has a large symptomatic left inguinal hernia, and a small asymptomatic incisional hernia along his previous midline splenectomy incision.  We discussed possible repair of both the incisional hernia and inguinal hernia vs only the symptomatic inguinal hernia alone.  PET scan was recently completed and reviewed.  The incisional hernia defect is small and asymptomatic.  We decided to proceed with inguinal hernia repair alone.  With his previous surgeries, I recommended an open left inguinal hernia repair with mesh.  We discussed the procedure, its risks, benefits and alternatives and the patient granted consent to  proceed.  We will proceed as scheduled.    Felicie Morn, MD  [facility] Use [email or web address] to contact on call provider

## 2022-06-17 NOTE — Op Note (Addendum)
   Patient: Cory Marks (May 08, 1961, [id number])  Date of Surgery: 06/17/2022   Preoperative Diagnosis: LEFT INGUINAL HERNIA   Postoperative Diagnosis: LEFT INGUINAL HERNIA   Surgical Procedure: OPEN LEFT INGUINAL HERNIA REPAIR:  INSERTION OF MESH: 37342 (CPT)   Operative Team Members:  Surgeon(s) and Role:    * Paulyne Mooty, Nickola Major, MD - Primary  Don Perking, MD - [facility] Resident Assistant  Anesthesiologist: Nilda Simmer, MD; Lidia Collum, MD CRNA: Georgia Duff, CRNA   Anesthesia: General   Fluids:  Total I/O In: 1000 [A.J.:6811] Out: -   Complications: None   Drains:  none   Specimen: None  Disposition:  PACU - hemodynamically stable.  Plan of Care: Discharge to home after PACU    Indications for Procedure: [name] is a 61 y.o. male who presented with a left  inguinal hernia.  I recommended open repair.  The procedure, its risks, benefits and alternatives were discussed and the patient granted consent to proceed.  Findings: Technique: Lichtenstein Hernia Location: indirect left inguinal hernia containing mostly fatty tissue Mesh Size &Type:  7.5 cm x 15 cm Bard Soft Mesh Mesh Fixation: 0-Novafil  suture  Infection status: Patient: Private Patient Elective Case Case: Elective Infection Present At Time Of Surgery (PATOS): None   Description of Procedure:  The patient was positioned supine, padded and secured to the bed, with both arms tucked.  The abdomen was widely prepped and draped.  A time out procedure was performed.    An oblique incision was made overlying the left external inguinal ring and dissection was carried down through the subcutaneous tissue until the aponeurosis of the external abdominal oblique was identified.  The aponeurosis was incised along it's fibers, and the inguinal canal was entered.  The ilioinguinal nerve was identified and resected.  The spermatic cord was encircled with a Penrose drain. The cord  was inspected. There was an indirect hernia containing mostly fatty tissue.  The indirect hernia sac was dissected off the cord and delivered back into the preperitoneal space.  Multiple vicryl sutures were used to close the internal inguinal ring connecting the conjoint tendon to the shelving edge of the inguinal ligament.   A piece of Bard Soft mesh was opened, trimmed to 7.5 cm x 15 cm and a slit was placed down the middle, lengthwise.  One end of the mesh was rounded off and sutured directly to the pubic bone with 0 Novafil.  The same suture was used, interrupted, to fixate the lateral aspect of the mesh to the shelving edge of the inguinal ligament.  Medially, the mesh was positioned below the external oblique aponeurosis.  No fixation was placed superior to the opening of the internal ring.  The limbs of the mesh were placed around the spermatic cord, tucked up under the external oblique aponeurosis, and fixated laterally to the shelving edge of the inguinal ligament.  The mesh space was irrigated with saline.  The external abdominal oblique fascia was closed with a running Vicryl suture, and the external ring was reconstructed.  Scarpa's fascia was closed with Vicryl suture. The skin closed with 4-0 Monocryl subcuticular suture and skin glue.   Louanna Raw, MD General, Bariatric, & Minimally Invasive Surgery [facility] [place]

## 2022-06-17 NOTE — Transfer of Care (Signed)
Immediate Anesthesia Transfer of Care Note  Patient: Cory Marks  Procedure(s) Performed: OPEN LEFT INGUINAL HERNIA REPAIR (Left: Groin) INSERTION OF MESH (Left: Groin)  Patient Location: PACU  Anesthesia Type:General  Level of Consciousness: drowsy  Airway & Oxygen Therapy: Patient Spontanous Breathing  Post-op Assessment: Report given to RN and Post -op Vital signs reviewed and stable  Post vital signs: Reviewed and stable  Last Vitals:  Vitals Value Taken Time  BP 116/89 06/17/22 1615  Temp 36.9 C 06/17/22 1446  Pulse 55 06/17/22 1620  Resp 15 06/17/22 1619  SpO2 95 % 06/17/22 1620  Vitals shown include unvalidated device data.  Last Pain:  Vitals:   06/17/22 1446  TempSrc:   PainSc: 8       Patients Stated Pain Goal: 3 (29/52/84 1324)  Complications: No notable events documented.

## 2022-06-18 NOTE — Anesthesia Postprocedure Evaluation (Signed)
Anesthesia Post Note  Patient: Cory Marks  Procedure(s) Performed: OPEN LEFT INGUINAL HERNIA REPAIR (Left: Groin) INSERTION OF MESH (Left: Groin)     Patient location during evaluation: PACU Anesthesia Type: General Level of consciousness: awake and alert Pain management: pain level controlled Vital Signs Assessment: post-procedure vital signs reviewed and stable Respiratory status: spontaneous breathing, nonlabored ventilation and respiratory function stable Cardiovascular status: blood pressure returned to baseline and stable Postop Assessment: no apparent nausea or vomiting Anesthetic complications: no   No notable events documented.  Last Vitals:  Vitals:   06/17/22 1600 06/17/22 1615  BP: (!) 107/55 116/89  Pulse: (!) 55 (!) 55  Resp: 15 16  Temp:  (!) 36.2 C  SpO2: 97% 91%    Last Pain:  Vitals:   06/17/22 1615  TempSrc:   PainSc: 5                  Lidia Collum

## 2022-06-20 ENCOUNTER — Encounter (HOSPITAL_COMMUNITY): Payer: Self-pay | Admitting: Surgery

## 2023-04-19 ENCOUNTER — Ambulatory Visit: Payer: Medicare HMO | Admitting: Podiatry

## 2023-06-02 ENCOUNTER — Emergency Department (HOSPITAL_COMMUNITY)
Admission: EM | Admit: 2023-06-02 | Discharge: 2023-06-02 | Disposition: A | Discharge status: Home / Self Care | Payer: Medicare HMO | Attending: Emergency Medicine | Admitting: Emergency Medicine

## 2023-06-02 ENCOUNTER — Emergency Department (HOSPITAL_BASED_OUTPATIENT_CLINIC_OR_DEPARTMENT_OTHER): Payer: Medicare HMO

## 2023-06-02 ENCOUNTER — Other Ambulatory Visit: Payer: Self-pay

## 2023-06-02 ENCOUNTER — Encounter (HOSPITAL_COMMUNITY): Payer: Self-pay

## 2023-06-02 ENCOUNTER — Other Ambulatory Visit (HOSPITAL_COMMUNITY): Payer: Self-pay

## 2023-06-02 DIAGNOSIS — M7989 Other specified soft tissue disorders: Secondary | ICD-10-CM

## 2023-06-02 DIAGNOSIS — I82401 Acute embolism and thrombosis of unspecified deep veins of right lower extremity: Secondary | ICD-10-CM | POA: Insufficient documentation

## 2023-06-02 DIAGNOSIS — Z79899 Other long term (current) drug therapy: Secondary | ICD-10-CM | POA: Diagnosis not present

## 2023-06-02 DIAGNOSIS — I824Y1 Acute embolism and thrombosis of unspecified deep veins of right proximal lower extremity: Secondary | ICD-10-CM

## 2023-06-02 DIAGNOSIS — Z7901 Long term (current) use of anticoagulants: Secondary | ICD-10-CM | POA: Insufficient documentation

## 2023-06-02 DIAGNOSIS — M79661 Pain in right lower leg: Secondary | ICD-10-CM

## 2023-06-02 MED ORDER — APIXABAN 5 MG PO TABS
10.0000 mg | ORAL_TABLET | Freq: Once | ORAL | Status: AC
  Administered 2023-06-02: 10 mg via ORAL
  Filled 2023-06-02: qty 2

## 2023-06-02 MED ORDER — APIXABAN (ELIQUIS) VTE STARTER PACK (10MG AND 5MG)
ORAL_TABLET | ORAL | 0 refills | Status: AC
  Filled 2023-06-02: qty 74, 30d supply, fill #0

## 2023-06-02 NOTE — ED Triage Notes (Signed)
Pt c/o right leg swelling. Pt states he has had multiple problems with his leg in the past and has recently started having swelling in right calf with warmth. Pt saw PCP yesterday and was told to come to ED for [place] to check for DVT.

## 2023-06-02 NOTE — ED Provider Notes (Signed)
[facility] EMERGENCY DEPARTMENT AT [facility] Provider Note   CSN: [id number] Arrival date & time: 06/02/23  1041     History  Chief Complaint  Patient presents with   Leg Swelling    Cory Marks is a 62 y.o. male with past medical history of stage IV mantle cell lymphoma presents for concern for DVT of the right lower extremity.  Patient states that he has had chronic right lower extremity and left lower extremity swelling for several years.  Usually the right lower extremity is more swollen than the left.  He has had a prior surgery to the right knee.  He has no history of blood clots.  He denies lower extremity pain.  He saw his PCP yesterday who recommended he come to the ED for an ultrasound to rule out DVT.  Not had any chest pain, shortness of breath, hemoptysis.  No fevers or chills.  He injured the right lower extremity mildly after scraping his shin on some pallets, but did not fall or hit his head.  No difficulty ambulating.  Was on a flight to and from [facility] 1 month ago but did not notice the swelling changing after this.      Home Medications Prior to Admission medications   Medication Sig Start Date End Date Taking? Authorizing Provider  APIXABAN (ELIQUIS) VTE STARTER PACK (10MG  AND 5MG ) Take as directed on package: start with two-5mg  tablets twice daily for 7 days. On day 8, switch to one-5mg  tablet twice daily. 06/02/23  Yes Karmen Stabs, MD  amLODipine (NORVASC) 10 MG tablet Take 1 tablet (10 mg total) by mouth every morning. Patient taking differently: Take 10 mg by mouth daily. 07/04/17   Massie Maroon, FNP  atorvastatin (LIPITOR) 20 MG tablet Take 20 mg by mouth daily. 07/10/19   [provider]  dorzolamide (TRUSOPT) 2 % ophthalmic solution Place 1 drop into the left eye 2 (two) times daily. 03/03/21   [provider]  gabapentin (NEURONTIN) 300 MG capsule Take 300-600 mg by mouth See admin instructions. Taking 1 capsule  (300mg ) in the Am & PM and 2 capsules (600mg ) at bedtime 03/12/19   [provider]  lisinopril (ZESTRIL) 5 MG tablet Take 5 mg by mouth daily.  05/08/19   [provider]  Nutritional Supplements (IMMUNE ENHANCE) TABS Take 1 tablet by mouth daily.    [provider]  pantoprazole (PROTONIX) 40 MG tablet Take 40 mg by mouth daily. 05/07/21   [provider]  pyridOXINE (VITAMIN B-6) 100 MG tablet Take 100 mg by mouth daily.    [provider]  timolol (TIMOPTIC) 0.5 % ophthalmic solution Place 1 drop into the left eye 2 (two) times daily.  11/08/19   [provider]  traMADol (ULTRAM) 50 MG tablet Take 1 tablet (50 mg total) by mouth every 6 (six) hours as needed. 06/17/22 06/17/23  Stechschulte, Hyman Hopes, MD      Allergies    Oxycodone    Review of Systems   Review of Systems as per HPI above  Physical Exam Updated Vital Signs BP 112/86   Pulse 70   Temp 97.9 F (36.6 C)   Resp 18   Ht 6' (1.829 m)   Wt 94 kg   SpO2 100%   BMI 28.11 kg/m  Physical Exam Constitutional:      Appearance: Normal appearance.  HENT:     Head: Normocephalic and atraumatic.     Nose: Nose  normal.     Mouth/Throat:     Mouth: Mucous membranes are moist.  Eyes:     Extraocular Movements: Extraocular movements intact.     Pupils: Pupils are equal, round, and reactive to light.  Cardiovascular:     Rate and Rhythm: Normal rate and regular rhythm.     Pulses: Normal pulses.     Heart sounds: Normal heart sounds.  Pulmonary:     Effort: Pulmonary effort is normal.     Breath sounds: Normal breath sounds.  Abdominal:     General: There is no distension.     Palpations: Abdomen is soft.     Tenderness: There is no abdominal tenderness. There is no guarding.  Musculoskeletal:        General: No tenderness.     Cervical back: Neck supple.     Right lower leg: Edema present.     Left lower leg: Edema present.     Comments: Abrasion to the anterior R mid  shin. No cellulitic changes. Nontender. See photo  Skin:    General: Skin is warm and dry.     Capillary Refill: Capillary refill takes less than 2 seconds.  Neurological:     General: No focal deficit present.     Mental Status: He is alert.     Cranial Nerves: No cranial nerve deficit.     Sensory: No sensory deficit.     Motor: No weakness.     Gait: Gait normal.     ED Results / Procedures / Treatments   Labs (all labs ordered are listed, but only abnormal results are displayed) Labs Reviewed - No data to display  EKG None  Radiology VAS [place] LOWER EXTREMITY VENOUS (DVT) (ONLY [facility])  Result Date: 06/02/2023  Lower Venous DVT Study Patient Name:  Cory Marks  Date of Exam:   06/02/2023 Medical Rec #: [id number]        Accession #:    [id number] Date of Birth: [birth year]         Patient Gender: M Patient Age:   40 years Exam Location:  [facility] Procedure:      VAS [place] LOWER EXTREMITY VENOUS (DVT) Referring Phys: Duwayne Heck RAY --------------------------------------------------------------------------------  Indications: Swelling, and Pain.  Comparison Study: No prior Performing Technologist: Fernande Bras  Examination Guidelines: A complete evaluation includes B-mode imaging, spectral Doppler, color Doppler, and power Doppler as needed of all accessible portions of each vessel. Bilateral testing is considered an integral part of a complete examination. Limited examinations for reoccurring indications may be performed as noted. The reflux portion of the exam is performed with the patient in reverse Trendelenburg.  +---------+---------------+---------+-----------+----------+-----------------+ RIGHT    CompressibilityPhasicitySpontaneityPropertiesThrombus Aging    +---------+---------------+---------+-----------+----------+-----------------+ CFV      Full           Yes      Yes                                     +---------+---------------+---------+-----------+----------+-----------------+ SFJ      Full                                                           +---------+---------------+---------+-----------+----------+-----------------+ FV Prox  None           No                            Age Indeterminate +---------+---------------+---------+-----------+----------+-----------------+ FV Mid   None           No                            Age Indeterminate +---------+---------------+---------+-----------+----------+-----------------+ FV DistalNone           No                            Age Indeterminate +---------+---------------+---------+-----------+----------+-----------------+ PFV      Full                                                           +---------+---------------+---------+-----------+----------+-----------------+ POP      None           No                            Age Indeterminate +---------+---------------+---------+-----------+----------+-----------------+ PTV      Partial                                                        +---------+---------------+---------+-----------+----------+-----------------+ PERO     None                                                           +---------+---------------+---------+-----------+----------+-----------------+ Gastroc  None                                                           +---------+---------------+---------+-----------+----------+-----------------+   +----+---------------+---------+-----------+----------+--------------+ LEFTCompressibilityPhasicitySpontaneityPropertiesThrombus Aging +----+---------------+---------+-----------+----------+--------------+ CFV Full           Yes      Yes                                 +----+---------------+---------+-----------+----------+--------------+ SFJ Full                                                         +----+---------------+---------+-----------+----------+--------------+     Summary: RIGHT: - Findings consistent with acute deep vein thrombosis involving the right femoral vein, right popliteal vein, right posterior tibial veins, and right peroneal veins.  Findings consistent with age indeterminate intramuscular thrombus involving the right gastrocnemius veins. - No cystic structure found  in the popliteal fossa.  LEFT: - No evidence of common femoral vein obstruction.   *See table(s) above for measurements and observations. Electronically signed by Gerarda Fraction on 06/02/2023 at 5:04:34 PM.    Final     Procedures Procedures    Medications Ordered in ED Medications  apixaban (ELIQUIS) tablet 10 mg (10 mg Oral Given 06/02/23 1700)    ED Course/ Medical Decision Making/ A&P                               Medical Decision Making Amount and/or Complexity of Data Reviewed External Data Reviewed: notes. Radiology: ordered and independent interpretation performed. Decision-making details documented in ED Course.  Risk Prescription drug management. Decision regarding hospitalization.   63 year old male with mantle cell lymphoma presenting for right lower extremity cute on chronic swelling.  DDX considered includes DVT, chronic lymphedema, cellulitis or abscess, compartment syndrome, HF exacerbation.  On arrival vitals are stable he is afebrile.  Overall presentation not consistent with acute hypervolemia or CHF exacerbation.  Presentation not consistent with compartment syndrome given normal neurovascular exam and soft compartments. No signs of cellulitis, abscess on exam though he has a small abrasion from a minor injury as above. DVT ultrasound obtained and shows DVT in the right lower extremity. There is the possibility of a concurrent pulmonary embolism given that this is common in patients with DVT. He is however on room air and without chest pain or shortness of breath; even if he had  clinically insignificant pulmonary emboli it would not change management plan of DOAC and discharge.  I had a risk-benefit discussion of further PE a testing including CTA PE with the patient and through shared decision making he declined further workup for this.  I reviewed his most recent labs from earlier this month at [facility].  He does have renal impairment but his EGFR is appropriate for regular dose Eliquis and this was confirmed with pharmacy. No contraindication for anticoagulation. No evidence of phlegmasia to warrant lytics or thrombectomy. He was prescribed Eliquis at discharge. Will f/u with PCP in 1 week for re-evaluation. Strict return precautions discussed.  Discharged ambulatory in stable condition.          Final Clinical Impression(s) / ED Diagnoses Final diagnoses:  Leg swelling  Deep vein thrombosis (DVT) of proximal vein of right lower extremity, unspecified chronicity (HCC)    Rx / DC Orders ED Discharge Orders          Ordered    APIXABAN (ELIQUIS) VTE STARTER PACK (10MG  AND 5MG )       Note to Pharmacy: If starter pack unavailable, substitute with seventy-four 5 mg apixaban tabs following the above SIG directions.   06/02/23 1638              Karmen Stabs, MD 06/02/23 1712    Glyn Ade, MD 06/02/23 2336

## 2023-06-02 NOTE — Progress Notes (Signed)
Right lower extremity venous duplex completed. Results in Epic. Preliminary given to DR. Chase.   [facility], RVT.

## 2023-06-02 NOTE — Discharge Instructions (Signed)
RETURN TO THE ED IF YOU HAVE CHEST PAIN, PASSING OUT, SHORTNESS OF BREATH, OR ANY NEW OR CONCERNING SYMPTOMS

## 2023-06-06 ENCOUNTER — Ambulatory Visit (HOSPITAL_COMMUNITY)
Admission: EM | Admit: 2023-06-06 | Discharge: 2023-06-06 | Disposition: A | Discharge status: Home / Self Care | Payer: Medicare HMO | Attending: Internal Medicine | Admitting: Internal Medicine

## 2023-06-06 ENCOUNTER — Other Ambulatory Visit: Payer: Self-pay

## 2023-06-06 ENCOUNTER — Encounter (HOSPITAL_COMMUNITY): Payer: Self-pay | Admitting: Emergency Medicine

## 2023-06-06 ENCOUNTER — Ambulatory Visit (INDEPENDENT_AMBULATORY_CARE_PROVIDER_SITE_OTHER): Payer: Medicare HMO

## 2023-06-06 DIAGNOSIS — M25531 Pain in right wrist: Secondary | ICD-10-CM

## 2023-06-06 DIAGNOSIS — M79641 Pain in right hand: Secondary | ICD-10-CM

## 2023-06-06 NOTE — ED Provider Notes (Signed)
[facility]    CSN: [id number] Arrival date & time: 06/06/23  1106      History   Chief Complaint Chief Complaint  Patient presents with   Wrist Pain    HPI Cory Marks is a 62 y.o. male.   The history is provided by the patient.  Wrist Pain  Right hand, fifth finger and right wrist pain.  Has chronic wrist pain since having a shoulder replacement 5 years ago however his pain is exacerbated today.  States was parked on an incline and he attempted to stop his truck door from closing.  Unclear to specific mechanism. He is right-handed. Denies paresthesias, weakness, other injury  Past Medical History:  Diagnosis Date   Arthritis    Bell's palsy    left eye droops a little   CAD (coronary artery disease)    Cancer (HCC) 2012   Mantle Cell Cancer- S/P stem cell transplant   CKD (chronic kidney disease) stage 3, GFR 30-59 ml/min (HCC)    per lov with pcp,  last seen at [facility] january 2020 per patient, stable per patient    Diabetes mellitus without complication (HCC)    hx of lost weight due to cancer not on medications since 2014    Dysrhythmia    atrial fib and atrial flutter, no repeat of dysrhymia since ablation in 2018 ,    Eye injury    left eye hit by a wood chip early june 2020, lov for f/u with opthamologist 6-17 , drained a pustule,  progress notes mention eye gradually improving with application of antibiotic  eye drops    History of chemotherapy last tx 2014   History of diabetes mellitus NONE SINCE 2012 CANCER   History of radiation therapy 02/2022   Mouth cancer   History of splenectomy 01/30/2011   Hyperlipemia    Hypertension    Neuromuscular disorder (HCC)    peripheral neuropathy   Neuropathy    right leg due to football injury   Polysubstance abuse (HCC)    Sinus bradycardia     Patient Active Problem List   Diagnosis Date Noted   Nonischemic cardiomyopathy (HCC) 06/07/2022   S/P lumbar fusion 06/16/2021   Preoperative  clearance 05/07/2021   Chronic pain syndrome 02/22/2021   Secondary open-angle glaucoma, left, severe stage 11/08/2020   H/O splenectomy 10/07/2020   Acute hematogenous osteomyelitis of right foot (HCC) 01/22/2020   Cutaneous abscess of right foot    S/P reverse total shoulder arthroplasty, right 01/31/2019   Spinal stenosis of lumbar region 10/10/2018   Oropharyngeal dysphagia 07/11/2018   Pneumonia in aspergillosis (HCC) 07/11/2018   Degeneration of lumbar intervertebral disc 05/01/2018   History of total knee replacement, left 03/21/2018   Age-related nuclear cataract of right eye 01/01/2018   Aphakia of eye, left 01/01/2018   Ocular hypertension of left eye 01/01/2018   Retained lens material following cataract surgery of left eye 09/22/2017   OA (osteoarthritis) of knee 01/30/2017   Right foot pain 11/04/2016   Amputation, toe, traumatic, right, sequela (HCC) 11/04/2016   Neuropathy 11/04/2016   Typical atrial flutter (HCC) 09/12/2016   Encounter for therapeutic drug monitoring 04/18/2016   H/O asplenia 06/15/2015   Bilateral right-sidedness sequence 06/15/2015   Chronic systolic congestive heart failure (HCC) 10/20/2014   Subacute osteomyelitis, right ankle and foot (HCC) 10/19/2014   Osteomyelitis of fifth toe of right foot (HCC) 10/17/2014   Septic arthritis of interphalangeal joint of toe of right foot (  HCC) 10/17/2014   Atrial flutter (HCC) 10/17/2014   History of lymphoma 05/30/2014   CKD (chronic kidney disease) stage 3, GFR 30-59 ml/min (HCC) 02/27/2014   Hyperlipidemia 01/23/2014   Ulcer of right foot (HCC) 01/23/2014   Right foot drop 01/23/2014   CAD in native artery 01/23/2014   Metabolic syndrome 01/23/2014   Encounter for screening colonoscopy 12/26/2013   History of bradycardia 12/26/2013   HTN (hypertension) 09/12/2012   Lymphoma (HCC) 09/12/2012   Diabetic peripheral neuropathy (HCC) 12/05/2011   Erectile dysfunction 12/05/2011   H/O autologous stem  cell transplant (HCC) 05/30/2011   Mantle cell lymphoma (HCC) 05/05/2011    Past Surgical History:  Procedure Laterality Date   A-FLUTTER ABLATION N/A 09/12/2016   Procedure: A-Flutter Ablation;  Surgeon: Will Jorja Loa, MD;  Location: [facility];  Service: Cardiovascular;  Laterality: N/A;   ABDOMINAL EXPOSURE N/A 06/16/2021   Procedure: ABDOMINAL EXPOSURE;  Surgeon: Cephus Shelling, MD;  Location: [facility] OR;  Service: Vascular;  Laterality: N/A;   AMPUTATION Right 10/20/2014   Procedure: AMPUTATION RIGHT GREAT TOE;  Surgeon: Ollen Gross, MD;  Location: [facility];  Service: Orthopedics;  Laterality: Right;   AMPUTATION Right 12/25/2014   Procedure: RIGHT 2ND TOE AMPUTATION ;  Surgeon: Toni Arthurs, MD;  Location: [facility];  Service: Orthopedics;  Laterality: Right;   AMPUTATION Right 12/20/2019   Procedure: RIGHT FOOT AMPUTATION TOES;  Surgeon: Nadara Mustard, MD;  Location: [facility] OR;  Service: Orthopedics;  Laterality: Right;   AMPUTATION Right 01/22/2020   Procedure: RIGHT FOOT 5TH RAY AMPUTATION;  Surgeon: Nadara Mustard, MD;  Location: [facility] OR;  Service: Orthopedics;  Laterality: Right;   edentulation N/A 2019   prior to transplant   EYE SURGERY Left    caratct   INGUINAL HERNIA REPAIR Left 06/17/2022   Procedure: OPEN LEFT INGUINAL HERNIA REPAIR;  Surgeon: Quentin Ore, MD;  Location: [facility] OR;  Service: General;  Laterality: Left;   INSERTION OF MESH Left 06/17/2022   Procedure: INSERTION OF MESH;  Surgeon: Quentin Ore, MD;  Location: [facility] OR;  Service: General;  Laterality: Left;   OBLIQUE LUMBAR INTERBODY FUSION 1 LEVEL WITH PERCUTANEOUS SCREWS N/A 06/16/2021   Procedure: OBLIQUE LUMBAR INTERBODY FUSION 1 LEVEL WITH PERCUTANEOUS SCREWS(OLIF L4-5 WITH POSTERIOR SPINAL FUSION INTERBODY);  Surgeon: Venita Lick, MD;  Location: [facility] OR;  Service: Orthopedics;  Laterality: N/A;  tap block with exparel 3 C-Bed   REVERSE SHOULDER ARTHROPLASTY Right  01/31/2019   Procedure: REVERSE SHOULDER ARTHROPLASTY;  Surgeon: Francena Hanly, MD;  Location: [facility];  Service: Orthopedics;  Laterality: Right;   right knee surgery Right    nerve graft back of leg   SPLENECTOMY, TOTAL  01/30/2011   for splenic rupture in settin of mantle cell lymphoma   TENOLYSIS Right 12/25/2014   Procedure: RIGHT 3,4,5 FLEXOR TENDON RELEASE ;  Surgeon: Toni Arthurs, MD;  Location: [facility];  Service: Orthopedics;  Laterality: Right;   TOTAL KNEE ARTHROPLASTY Left 01/30/2017   Procedure: LEFT TOTAL KNEE ARTHROPLASTY;  Surgeon: Ollen Gross, MD;  Location: [facility];  Service: Orthopedics;  Laterality: Left;       Home Medications    Prior to Admission medications   Medication Sig Start Date End Date Taking? Authorizing Provider  amLODipine (NORVASC) 10 MG tablet Take 1 tablet (10 mg total) by mouth every morning. Patient taking differently: Take 10 mg by mouth daily. 07/04/17   Massie Maroon, FNP  APIXABAN (ELIQUIS) VTE STARTER PACK (10MG  AND 5MG ) Take as directed on package: start with two-5mg  tablets twice daily for 7 days. On day 8, switch to one-5mg  tablet twice daily. 06/02/23   Karmen Stabs, MD  atorvastatin (LIPITOR) 20 MG tablet Take 20 mg by mouth daily. 07/10/19   [provider]  dorzolamide (TRUSOPT) 2 % ophthalmic solution Place 1 drop into the left eye 2 (two) times daily. 03/03/21   [provider]  gabapentin (NEURONTIN) 300 MG capsule Take 300-600 mg by mouth See admin instructions. Taking 1 capsule (300mg ) in the Am & PM and 2 capsules (600mg ) at bedtime 03/12/19   [provider]  lisinopril (ZESTRIL) 5 MG tablet Take 5 mg by mouth daily.  05/08/19   [provider]  Nutritional Supplements (IMMUNE ENHANCE) TABS Take 1 tablet by mouth daily.    [provider]  pantoprazole (PROTONIX) 40 MG tablet Take 40 mg by mouth daily. 05/07/21   [provider]  pyridOXINE (VITAMIN B-6) 100 MG  tablet Take 100 mg by mouth daily.    [provider]  timolol (TIMOPTIC) 0.5 % ophthalmic solution Place 1 drop into the left eye 2 (two) times daily.  11/08/19   [provider]  traMADol (ULTRAM) 50 MG tablet Take 1 tablet (50 mg total) by mouth every 6 (six) hours as needed. Patient not taking: Reported on 06/06/2023 06/17/22 06/17/23  Stechschulte, Hyman Hopes, MD    Family History Family History  Problem Relation Age of Onset   Hypertension Mother    Diabetes Mother    Cancer Father        pancreatic cancer   Diabetes Father    Heart disease Father    Diabetes Sister     Social History Social History   Tobacco Use   Smoking status: Never   Smokeless tobacco: Never  Vaping Use   Vaping status: Never Used  Substance Use Topics   Alcohol use: Yes    Alcohol/week: 6.0 standard drinks of alcohol    Types: 6 Cans of beer per week    Comment: occasional    Drug use: Yes    Frequency: 3.0 times per week    Types: Marijuana     Allergies   Oxycodone   Review of Systems Review of Systems   Physical Exam Triage Vital Signs ED Triage Vitals  Encounter Vitals Group     BP 06/06/23 1124 (!) 99/58     Systolic BP Percentile --      Diastolic BP Percentile --      Pulse Rate 06/06/23 1124 (!) 58     Resp 06/06/23 1124 20     Temp 06/06/23 1124 98.1 F (36.7 C)     Temp Source 06/06/23 1124 Oral     SpO2 06/06/23 1124 95 %     Weight --      Height --      Head Circumference --      Peak Flow --      Pain Score 06/06/23 1121 7     Pain Loc --      Pain Education --      Exclude from Growth Chart --    No data found.  Updated Vital Signs BP (!) 99/58 (BP Location: Right Arm)   Pulse (!) 58   Temp 98.1 F (36.7 C) (Oral)   Resp 20   SpO2 95%   Visual Acuity Right Eye Distance:   Left Eye Distance:  Bilateral Distance:    Right Eye Near:   Left Eye Near:    Bilateral Near:     Physical Exam HENT:     Head: Normocephalic.   Pulmonary:     Effort: Pulmonary effort is normal.  Musculoskeletal:     Comments: Right hand tender along fifth digit laterally, mild swelling, no bruising or deformity, neurovascular intact.  Right wrist tender ulnar aspect without visible swelling bruising or deformity, good radial pulse.  Limited range of motion secondary to pain.  Skin:    General: Skin is warm and dry.     Findings: No bruising or erythema.  Neurological:     Mental Status: He is oriented to person, place, and time.  Psychiatric:        Mood and Affect: Mood normal.      UC Treatments / Results  Labs (all labs ordered are listed, but only abnormal results are displayed) Labs Reviewed - No data to display  EKG   Radiology No results found.  Procedures Procedures (including critical care time)  Medications Ordered in UC Medications - No data to display  Initial Impression / Assessment and Plan / UC Course  I have reviewed the triage vital signs and the nursing notes.  Pertinent labs & imaging results that were available during my care of the patient were reviewed by me and considered in my medical decision making (see chart for details).     62 year old male with right wrist and hand pain after attempting to stop by swinging truck door.  Unclear to specific mechanism.  Complaining of pain in the hand and the wrist.  Has mild tenderness on exam without deformity.  Right hand and wrist x-rays independently viewed by me degenerative changes no fractures. Recommend Tylenol, ice, elevation, follow-up with [facility] care provider in 1 week if no improvement Final Clinical Impressions(s) / UC Diagnoses   Final diagnoses:  None   Discharge Instructions   None    ED Prescriptions   None    PDMP not reviewed this encounter.   Meliton Rattan, [place] 06/06/23 1218

## 2023-06-06 NOTE — Discharge Instructions (Signed)
Your x-ray does not show any obvious fracture however there are some chronic changes consistent with arthritis. Recommend over-the-counter extra strength Tylenol as directed on the package, can apply ice to affected areas for 10 minutes every 2 hours to reduce pain and swelling The x-ray reading we discussed is preliminary. Your x-ray will be read by a radiologist in next few hours. If there is a discrepancy, you will be contacted, and instructed on a new plan for you care.  See your doctor in 1 week if no improvement in symptoms

## 2023-06-06 NOTE — ED Triage Notes (Signed)
Patient reports trying to stop his truck door and reports pain in little finger has pain, wrist pain.  Had a total right shoulder replacement 5 years ago and continues to have issues per patient.  Reports wrist was painful post shoulder surgery, but since trying to grab a truck door this morning, wrist has been very painful.    Denies jamming hand or little finger or getting any part of hand /wrist caught in closing of door.

## 2023-06-20 ENCOUNTER — Encounter (HOSPITAL_BASED_OUTPATIENT_CLINIC_OR_DEPARTMENT_OTHER): Payer: Self-pay

## 2023-06-20 ENCOUNTER — Emergency Department (HOSPITAL_BASED_OUTPATIENT_CLINIC_OR_DEPARTMENT_OTHER): Payer: Medicare HMO

## 2023-06-20 ENCOUNTER — Other Ambulatory Visit (HOSPITAL_BASED_OUTPATIENT_CLINIC_OR_DEPARTMENT_OTHER): Payer: Self-pay

## 2023-06-20 ENCOUNTER — Other Ambulatory Visit: Payer: Self-pay

## 2023-06-20 ENCOUNTER — Emergency Department (HOSPITAL_BASED_OUTPATIENT_CLINIC_OR_DEPARTMENT_OTHER)
Admission: EM | Admit: 2023-06-20 | Discharge: 2023-06-20 | Disposition: A | Discharge status: Home / Self Care | Payer: Medicare HMO | Attending: Emergency Medicine | Admitting: Emergency Medicine

## 2023-06-20 DIAGNOSIS — Z79899 Other long term (current) drug therapy: Secondary | ICD-10-CM | POA: Diagnosis not present

## 2023-06-20 DIAGNOSIS — I2510 Atherosclerotic heart disease of native coronary artery without angina pectoris: Secondary | ICD-10-CM | POA: Diagnosis not present

## 2023-06-20 DIAGNOSIS — Z7901 Long term (current) use of anticoagulants: Secondary | ICD-10-CM | POA: Diagnosis not present

## 2023-06-20 DIAGNOSIS — I82531 Chronic embolism and thrombosis of right popliteal vein: Secondary | ICD-10-CM | POA: Diagnosis not present

## 2023-06-20 DIAGNOSIS — I129 Hypertensive chronic kidney disease with stage 1 through stage 4 chronic kidney disease, or unspecified chronic kidney disease: Secondary | ICD-10-CM | POA: Diagnosis not present

## 2023-06-20 DIAGNOSIS — N189 Chronic kidney disease, unspecified: Secondary | ICD-10-CM | POA: Insufficient documentation

## 2023-06-20 DIAGNOSIS — E1122 Type 2 diabetes mellitus with diabetic chronic kidney disease: Secondary | ICD-10-CM | POA: Diagnosis not present

## 2023-06-20 DIAGNOSIS — R2241 Localized swelling, mass and lump, right lower limb: Secondary | ICD-10-CM | POA: Diagnosis present

## 2023-06-20 NOTE — ED Notes (Signed)
Patient transported to ultrasound via wheelchair. No acute distress at this time.

## 2023-06-20 NOTE — Discharge Instructions (Addendum)
It was a pleasure caring for you today.  Please continue to take your Eliquis as prescribed.  I have sent an ambulatory referral to the DVT clinic.  Please call [phone] if they do not reach out to you in the next 48 hours.  Please also follow-up with your primary care provider.  Seek emergency care if experiencing any new or worsening symptoms.

## 2023-06-20 NOTE — ED Notes (Signed)
PA at bedside.

## 2023-06-20 NOTE — ED Provider Notes (Signed)
[place] EMERGENCY DEPARTMENT AT [facility] Provider Note   CSN: [id number] Arrival date & time: 06/20/23  0944     History  Chief Complaint  Patient presents with   Leg Swelling    Cory Marks is a 62 y.o. male with PMHx splenectomy, arthritis, CAD, CKD, DM, HLD, HTN, polysubstance use disorder, mantle cell lymphoma who presents to ED concerned with right leg swelling. Patient ambulatory in ED. Patient diagnosed with DVT 1 week ago and has been complaint with Eliquis since. Patient has not been experiencing pain with the DVT given 40 year hx of neuropathy in this right leg after trauma. Patient is concerned because leg swelling has not been resolving appropriately. Patient's PCP referred him to ED for [place] reevaluation.  Denies fever, chest pain, dyspnea, cough, nausea, vomiting, diarrhea.   HPI     Home Medications Prior to Admission medications   Medication Sig Start Date End Date Taking? Authorizing Provider  ELIQUIS 5 MG TABS tablet Take 5 mg by mouth 2 (two) times daily. 06/19/23  Yes [provider]  furosemide (LASIX) 20 MG tablet Take 20 mg by mouth daily. 06/02/23  Yes [provider]  rosuvastatin (CRESTOR) 40 MG tablet Take 40 mg by mouth daily. 02/10/22  Yes [provider]  amLODipine (NORVASC) 10 MG tablet Take 1 tablet (10 mg total) by mouth every morning. Patient taking differently: Take 10 mg by mouth daily. 07/04/17   Massie Maroon, FNP  APIXABAN Everlene Balls) VTE STARTER PACK (10MG  AND 5MG ) Take as directed on package: start with two-5mg  tablets twice daily for 7 days. On day 8, switch to one-5mg  tablet twice daily. 06/02/23   Karmen Stabs, MD  atorvastatin (LIPITOR) 20 MG tablet Take 20 mg by mouth daily. 07/10/19   [provider]  dorzolamide (TRUSOPT) 2 % ophthalmic solution Place 1 drop into the left eye 2 (two) times daily. 03/03/21   [provider]  gabapentin (NEURONTIN) 300 MG capsule Take 300-600  mg by mouth See admin instructions. Taking 1 capsule (300mg ) in the Am & PM and 2 capsules (600mg ) at bedtime 03/12/19   [provider]  lisinopril (ZESTRIL) 5 MG tablet Take 5 mg by mouth daily.  05/08/19   [provider]  Nutritional Supplements (IMMUNE ENHANCE) TABS Take 1 tablet by mouth daily.    [provider]  pantoprazole (PROTONIX) 40 MG tablet Take 40 mg by mouth daily. 05/07/21   [provider]  penicillin v potassium (VEETID) 500 MG tablet Take 500 mg by mouth 2 (two) times daily.    [provider]  pyridOXINE (VITAMIN B-6) 100 MG tablet Take 100 mg by mouth daily.    [provider]  timolol (TIMOPTIC) 0.5 % ophthalmic solution Place 1 drop into the left eye 2 (two) times daily.  11/08/19   [provider]      Allergies    Oxycodone    Review of Systems   Review of Systems  Cardiovascular:  Positive for leg swelling.    Physical Exam Updated Vital Signs BP (!) 128/96   Pulse 93   Temp 97.6 F (36.4 C) (Oral)   Resp 18   Ht 6' (1.829 m)   Wt 94 kg   SpO2 94%   BMI 28.11 kg/m  Physical Exam Vitals and nursing note reviewed.  Constitutional:      General: He is not in acute distress.    Appearance: He is not ill-appearing or toxic-appearing.  HENT:  Head: Normocephalic and atraumatic.     Mouth/Throat:     Mouth: Mucous membranes are moist.  Eyes:     General: No scleral icterus.       Right eye: No discharge.        Left eye: No discharge.     Conjunctiva/sclera: Conjunctivae normal.  Cardiovascular:     Rate and Rhythm: Normal rate and regular rhythm.     Pulses: Normal pulses.     Heart sounds: Normal heart sounds. No murmur heard. Pulmonary:     Effort: Pulmonary effort is normal. No respiratory distress.     Breath sounds: Normal breath sounds. No wheezing, rhonchi or rales.  Abdominal:     General: Abdomen is flat. Bowel sounds are normal. There is no distension.     Palpations:  Abdomen is soft. There is no mass.     Tenderness: There is no abdominal tenderness.  Musculoskeletal:     Right lower leg: No edema.     Left lower leg: No edema.     Comments: Darkening of distal LE consistent with venous insufficiency with scattered small areas of skin breakdown. Very mild increased swelling of right calf when compared to left. No tenderness to palpation. Equal +1 pedal pulse of BL legs. Some sensation to light touch intact. No increased warmth or surrounding erythema.  Skin:    General: Skin is warm and dry.     Findings: No rash.  Neurological:     General: No focal deficit present.     Mental Status: He is alert. Mental status is at baseline.  Psychiatric:        Mood and Affect: Mood normal.        Behavior: Behavior normal.     ED Results / Procedures / Treatments   Labs (all labs ordered are listed, but only abnormal results are displayed) Labs Reviewed - No data to display  EKG None  Radiology US Venous Img Lower Right (DVT Study)  Result Date: 06/20/2023 CLINICAL DATA:  Left lower extremity pain and edema. Recent diagnosis of pulmonary embolism. Evaluate for acute or chronic DVT. EXAM: LEFT LOWER EXTREMITY VENOUS DOPPLER ULTRASOUND TECHNIQUE: Gray-scale sonography with graded compression, as well as color Doppler and duplex ultrasound were performed to evaluate the lower extremity deep venous systems from the level of the common femoral vein and including the common femoral, femoral, profunda femoral, popliteal and calf veins including the posterior tibial, peroneal and gastrocnemius veins when visible. The superficial great saphenous vein was also interrogated. Spectral Doppler was utilized to evaluate flow at rest and with distal augmentation maneuvers in the common femoral, femoral and popliteal veins. COMPARISON:  None Available. FINDINGS: Contralateral Common Femoral Vein: Respiratory phasicity is normal and symmetric with the symptomatic side. No  evidence of thrombus. Normal compressibility. Common Femoral Vein: No evidence of thrombus. Normal compressibility, respiratory phasicity and response to augmentation. Saphenofemoral Junction: No evidence of thrombus. Normal compressibility and flow on color Doppler imaging. Profunda Femoral Vein: No evidence of thrombus. Normal compressibility and flow on color Doppler imaging. Femoral Vein: There is mixed echogenic occlusive thrombus involving the proximal (image 21), mid (image 25) and distal (image 29) aspects of the right femoral vein. Echogenic/Calcified thrombus is noted within mid aspect of the right femoral vein (image 23 Popliteal Vein: There is mixed echogenic partially calcified occlusive thrombus within the right popliteal vein (images 32). Calf Veins: No evidence of thrombus. Normal compressibility and flow on color Doppler imaging. Superficial Great Saphenous  Vein: No evidence of thrombus. Normal compressibility. Other Findings:  None. IMPRESSION: Examination is positive for occlusive DVT extending from the proximal femoral vein through the imaged popliteal vein. While the majority of the thrombus is favored to be chronic in etiology (given associated echogenic/calcified thrombus), in the absence of prior examinations, an acute on chronic process is not excluded. Clinical correlation is advised. Electronically Signed   By: Simonne Come M.D.   On: 06/20/2023 12:21    Procedures Procedures    Medications Ordered in ED Medications - No data to display  ED Course/ Medical Decision Making/ A&P                                 Medical Decision Making  This patient presents to the ED for concern of DVT, this involves an extensive number of treatment options, and is a complaint that carries with it a high risk of complications and morbidity.  The differential diagnosis includes cellulitis/abscess, DVT, PE, sepsis   Co morbidities that complicate the patient evaluation  splenectomy,  arthritis, CAD, CKD, DM, HLD, HTN, polysubstance use disorder, mantle cell lymphoma    Additional history obtained:  [place] PCP   Imaging Studies ordered:  I ordered imaging studies including -DVT US: to assess patient's DVT  I independently visualized and interpreted imaging I agree with the radiologist interpretation   Problem List / ED Course / Critical interventions / Medication management  Patient presents to ED concern for right lower leg swelling.  Patient diagnosed with DVT 1 week ago and endorses compliance with Eliquis.  Patient stating that the swelling has not been resolving.  Patient denying any infectious/respiratory symptoms today.  Patient afebrile with stable vitals.  Patient overall well-appearing.  Patient ambulatory in ED. DVT US showing acute on chronic appearing DVT.  Educated patient to continue taking his Eliquis and I will refer him to the DVT clinic.  Also recommended following up with PCP.  Patient verbalized understanding of plan. Staffed with Dr. Rhunette Croft I have reviewed the patients home medicines and have made adjustments as needed Patient afebrile with stable vitals.  Provided with return precautions.  Discharged in good condition.   Social Determinants of Health:  none          Final Clinical Impression(s) / ED Diagnoses Final diagnoses:  Chronic deep vein thrombosis (DVT) of popliteal vein of right lower extremity (HCC)    Rx / DC Orders ED Discharge Orders          Ordered    AMB Referral to Deep Vein Thrombosis Clinic       Comments: Please call [phone] with any questions.   06/20/23 1254              Dorthy Cooler, PA-C 06/20/23 1309    Derwood Kaplan, MD 06/21/23 [phone]

## 2023-06-20 NOTE — ED Notes (Signed)
Patient returned from ultrasound.

## 2023-06-20 NOTE — ED Notes (Signed)
Patient ambulatory to the bathroom without assistance.

## 2023-06-20 NOTE — ED Triage Notes (Signed)
In for eval of right leg pain and swelling. Known DVT and started blood thinners approx 1 week ago. PCP sent for another ultrasound.

## 2023-06-27 ENCOUNTER — Other Ambulatory Visit: Payer: Self-pay | Admitting: Family Medicine

## 2023-06-27 DIAGNOSIS — I82409 Acute embolism and thrombosis of unspecified deep veins of unspecified lower extremity: Secondary | ICD-10-CM

## 2023-08-15 ENCOUNTER — Ambulatory Visit: Payer: Medicare HMO | Admitting: Podiatry

## 2023-08-15 ENCOUNTER — Encounter: Payer: Self-pay | Admitting: Podiatry

## 2023-08-15 DIAGNOSIS — E1142 Type 2 diabetes mellitus with diabetic polyneuropathy: Secondary | ICD-10-CM | POA: Diagnosis not present

## 2023-08-15 DIAGNOSIS — Z89439 Acquired absence of unspecified foot: Secondary | ICD-10-CM

## 2023-08-15 DIAGNOSIS — E119 Type 2 diabetes mellitus without complications: Secondary | ICD-10-CM

## 2023-08-15 DIAGNOSIS — L84 Corns and callosities: Secondary | ICD-10-CM | POA: Diagnosis not present

## 2023-08-15 DIAGNOSIS — B351 Tinea unguium: Secondary | ICD-10-CM

## 2023-08-15 DIAGNOSIS — Z0189 Encounter for other specified special examinations: Secondary | ICD-10-CM

## 2023-08-15 NOTE — Progress Notes (Signed)
 ANNUAL DIABETIC FOOT EXAM  Subjective: Cory Marks presents today for annual diabetic foot exam.  Chief Complaint  Patient presents with   Nail Problem    Patient states he saw his PCP a couple months ago.   Patient confirms h/o diabetes.  Patient has h/o amputation(s): Transmetatarsal amputation right foot.  Patient has been diagnosed with neuropathy.  Campbell Reynolds, NP is patient's PCP.  Past Medical History:  Diagnosis Date   Arthritis    Bell's palsy    left eye droops a little   CAD (coronary artery disease)    Cancer (HCC) 2012   Mantle Cell Cancer- S/P stem cell transplant   CKD (chronic kidney disease) stage 3, GFR 30-59 ml/min (HCC)    per lov with pcp,  last seen at [facility] january 2020 per patient, stable per patient    Diabetes mellitus without complication (HCC)    hx of lost weight due to cancer not on medications since 2014    Dysrhythmia    atrial fib and atrial flutter, no repeat of dysrhymia since ablation in 2018 ,    Eye injury    left eye hit by a wood chip early june 2020, lov for f/u with opthamologist 6-17 , drained a pustule,  progress notes mention eye gradually improving with application of antibiotic  eye drops    History of chemotherapy last tx 2014   History of diabetes mellitus NONE SINCE 2012 CANCER   History of radiation therapy 02/2022   Mouth cancer   History of splenectomy 01/30/2011   Hyperlipemia    Hypertension    Neuromuscular disorder (HCC)    peripheral neuropathy   Neuropathy    right leg due to football injury   Polysubstance abuse (HCC)    Sinus bradycardia    Patient Active Problem List   Diagnosis Date Noted   Nonischemic cardiomyopathy (HCC) 06/07/2022   S/P lumbar fusion 06/16/2021   Preoperative clearance 05/07/2021   Chronic pain syndrome 02/22/2021   Secondary open-angle glaucoma, left, severe stage 11/08/2020   H/O splenectomy 10/07/2020   Acute hematogenous osteomyelitis of right foot (HCC)  01/22/2020   Cutaneous abscess of right foot    S/P reverse total shoulder arthroplasty, right 01/31/2019   Spinal stenosis of lumbar region 10/10/2018   Oropharyngeal dysphagia 07/11/2018   Pneumonia in aspergillosis (HCC) 07/11/2018   Degeneration of lumbar intervertebral disc 05/01/2018   History of total knee replacement, left 03/21/2018   Age-related nuclear cataract of right eye 01/01/2018   Aphakia of eye, left 01/01/2018   Ocular hypertension of left eye 01/01/2018   Retained lens material following cataract surgery of left eye 09/22/2017   OA (osteoarthritis) of knee 01/30/2017   Right foot pain 11/04/2016   Amputation, toe, traumatic, right, sequela (HCC) 11/04/2016   Neuropathy 11/04/2016   Typical atrial flutter (HCC) 09/12/2016   Encounter for therapeutic drug monitoring 04/18/2016   H/O asplenia 06/15/2015   Bilateral right-sidedness sequence 06/15/2015   Chronic systolic congestive heart failure (HCC) 10/20/2014   Subacute osteomyelitis, right ankle and foot (HCC) 10/19/2014   Osteomyelitis of fifth toe of right foot (HCC) 10/17/2014   Septic arthritis of interphalangeal joint of toe of right foot (HCC) 10/17/2014   Atrial flutter (HCC) 10/17/2014   History of lymphoma 05/30/2014   CKD (chronic kidney disease) stage 3, GFR 30-59 ml/min (HCC) 02/27/2014   Hyperlipidemia 01/23/2014   Ulcer of right foot (HCC) 01/23/2014   Right foot drop 01/23/2014   CAD  in native artery 01/23/2014   Metabolic syndrome 01/23/2014   Encounter for screening colonoscopy 12/26/2013   History of bradycardia 12/26/2013   HTN (hypertension) 09/12/2012   Lymphoma (HCC) 09/12/2012   Diabetic peripheral neuropathy (HCC) 12/05/2011   Erectile dysfunction 12/05/2011   H/O autologous stem cell transplant (HCC) 05/30/2011   Mantle cell lymphoma (HCC) 05/05/2011   Past Surgical History:  Procedure Laterality Date   A-FLUTTER ABLATION N/A 09/12/2016   Procedure: A-Flutter Ablation;  Surgeon:  Will Gladis Norton, MD;  Location: [facility];  Service: Cardiovascular;  Laterality: N/A;   ABDOMINAL EXPOSURE N/A 06/16/2021   Procedure: ABDOMINAL EXPOSURE;  Surgeon: Gretta Lonni PARAS, MD;  Location: [facility] OR;  Service: Vascular;  Laterality: N/A;   AMPUTATION Right 10/20/2014   Procedure: AMPUTATION RIGHT GREAT TOE;  Surgeon: Dempsey Moan, MD;  Location: [facility] ORS;  Service: Orthopedics;  Laterality: Right;   AMPUTATION Right 12/25/2014   Procedure: RIGHT 2ND TOE AMPUTATION ;  Surgeon: Norleen Armor, MD;  Location: [facility];  Service: Orthopedics;  Laterality: Right;   AMPUTATION Right 12/20/2019   Procedure: RIGHT FOOT AMPUTATION TOES;  Surgeon: Harden Jerona GAILS, MD;  Location: [facility] OR;  Service: Orthopedics;  Laterality: Right;   AMPUTATION Right 01/22/2020   Procedure: RIGHT FOOT 5TH RAY AMPUTATION;  Surgeon: Harden Jerona GAILS, MD;  Location: [facility] OR;  Service: Orthopedics;  Laterality: Right;   edentulation N/A 2019   prior to transplant   EYE SURGERY Left    caratct   INGUINAL HERNIA REPAIR Left 06/17/2022   Procedure: OPEN LEFT INGUINAL HERNIA REPAIR;  Surgeon: Lyndel Deward PARAS, MD;  Location: [facility] OR;  Service: General;  Laterality: Left;   INSERTION OF MESH Left 06/17/2022   Procedure: INSERTION OF MESH;  Surgeon: Lyndel Deward PARAS, MD;  Location: [facility] OR;  Service: General;  Laterality: Left;   OBLIQUE LUMBAR INTERBODY FUSION 1 LEVEL WITH PERCUTANEOUS SCREWS N/A 06/16/2021   Procedure: OBLIQUE LUMBAR INTERBODY FUSION 1 LEVEL WITH PERCUTANEOUS SCREWS(OLIF L4-5 WITH POSTERIOR SPINAL FUSION INTERBODY);  Surgeon: Burnetta Aures, MD;  Location: [facility] OR;  Service: Orthopedics;  Laterality: N/A;  tap block with exparel  3 C-Bed   REVERSE SHOULDER ARTHROPLASTY Right 01/31/2019   Procedure: REVERSE SHOULDER ARTHROPLASTY;  Surgeon: Melita Drivers, MD;  Location: [facility];  Service: Orthopedics;  Laterality: Right;   right knee surgery Right    nerve graft back of leg    SPLENECTOMY, TOTAL  01/30/2011   for splenic rupture in settin of mantle cell lymphoma   TENOLYSIS Right 12/25/2014   Procedure: RIGHT 3,4,5 FLEXOR TENDON RELEASE ;  Surgeon: Norleen Armor, MD;  Location: [facility];  Service: Orthopedics;  Laterality: Right;   TOTAL KNEE ARTHROPLASTY Left 01/30/2017   Procedure: LEFT TOTAL KNEE ARTHROPLASTY;  Surgeon: Moan Dempsey, MD;  Location: [facility];  Service: Orthopedics;  Laterality: Left;   Current Outpatient Medications on File Prior to Visit  Medication Sig Dispense Refill   amLODipine  (NORVASC ) 10 MG tablet Take 1 tablet (10 mg total) by mouth every morning. (Patient taking differently: Take 10 mg by mouth daily.) 90 tablet 1   APIXABAN  (ELIQUIS ) VTE STARTER PACK (10MG  AND 5MG ) Take as directed on package: start with two-5mg  tablets twice daily for 7 days. On day 8, switch to one-5mg  tablet twice daily. 74 each 0   atorvastatin  (LIPITOR) 20 MG tablet Take 20 mg by mouth daily.     dorzolamide  (TRUSOPT ) 2 % ophthalmic solution Place 1 drop into the  left eye 2 (two) times daily.     ELIQUIS  5 MG TABS tablet Take 5 mg by mouth 2 (two) times daily.     furosemide (LASIX) 20 MG tablet Take 20 mg by mouth daily.     gabapentin  (NEURONTIN ) 300 MG capsule Take 300-600 mg by mouth See admin instructions. Taking 1 capsule (300mg ) in the Am & PM and 2 capsules (600mg ) at bedtime     lisinopril  (ZESTRIL ) 5 MG tablet Take 5 mg by mouth daily.      Nutritional Supplements (IMMUNE ENHANCE) TABS Take 1 tablet by mouth daily.     pantoprazole  (PROTONIX ) 40 MG tablet Take 40 mg by mouth daily.     penicillin  v potassium (VEETID) 500 MG tablet Take 500 mg by mouth 2 (two) times daily.     pyridOXINE  (VITAMIN B-6) 100 MG tablet Take 100 mg by mouth daily.     rosuvastatin  (CRESTOR ) 40 MG tablet Take 40 mg by mouth daily.     timolol  (TIMOPTIC ) 0.5 % ophthalmic solution Place 1 drop into the left eye 2 (two) times daily.      No current  facility-administered medications on file prior to visit.    Allergies  Allergen Reactions   Oxycodone  Itching and Palpitations   Social History   Occupational History   Not on file  Tobacco Use   Smoking status: Never   Smokeless tobacco: Never  Vaping Use   Vaping status: Never Used  Substance and Sexual Activity   Alcohol use: Yes    Alcohol/week: 6.0 standard drinks of alcohol    Types: 6 Cans of beer per week    Comment: occasional    Drug use: Yes    Frequency: 3.0 times per week    Types: Marijuana   Sexual activity: Yes    Partners: Female   Family History  Problem Relation Age of Onset   Hypertension Mother    Diabetes Mother    Cancer Father        pancreatic cancer   Diabetes Father    Heart disease Father    Diabetes Sister    Immunization History  Administered Date(s) Administered   DTaP 05/30/2013   HIB (PRP-T) 10/07/2020   Influenza Whole 05/09/2019   Influenza,inj,Quad PF,6+ Mos 05/29/2013, 05/30/2014, 06/15/2015, 03/29/2016, 05/08/2017, 05/18/2018   Influenza,inj,quad, With Preservative 05/09/2019   Influenza,trivalent, recombinat, inj, PF 05/30/2012   Influenza-Unspecified 06/06/2014   MMR 05/29/2013   Meningococcal B, OMV 10/07/2020, 12/03/2020   Meningococcal Conjugate 10/07/2020, 10/07/2020, 12/03/2020, 12/03/2020   Pneumococcal Conjugate-13 12/11/2014, 05/18/2018   Pneumococcal Polysaccharide-23 11/30/2011, 05/29/2013, 10/07/2020   Pneumococcal-Unspecified 05/30/2013   Tdap 05/30/2012, 05/30/2013     Review of Systems: Negative except as noted in the HPI.   Objective: There were no vitals filed for this visit.  Cory Marks is a pleasant 63 y.o. male in NAD. AAO X 3.  Title   Diabetic Foot Exam - detailed Date & Time: 08/15/2023  9:15 AM Diabetic Foot exam was performed with the following findings: Yes  Visual Foot Exam completed.: Yes  Is there a history of foot ulcer?: Yes Is there a foot ulcer now?: No Is there swelling?:  Yes Is there elevated skin temperature?: No Is there abnormal foot shape?: Yes Is there a claw toe deformity?: No Are the toenails long?: Yes Are the toenails thick?: Yes Are the toenails ingrown?: No Is the skin thin, fragile, shiny and hairless?: No Normal Range of Motion?: Yes Is there foot or  ankle muscle weakness?: No Do you have pain in calf while walking?: No Are the shoes appropriate in style and fit?: Yes Can the patient see the bottom of their feet?: No Pulse Foot Exam completed.: Yes   Right Posterior Tibialis: Present Left posterior Tibialis: Present   Right Dorsalis Pedis: Present Left Dorsalis Pedis: Present     Sensory Foot Exam Completed.: Yes Semmes-Weinstein Monofilament Test + means has sensation and - means no sensation  R Foot Test Control: Neg L Foot Test Control: Neg   R Site 1-Great Toe: Neg L Site 1-Great Toe: Neg   R Site 4: Neg L Site 4: Neg   R site 5: Neg L Site 5: Neg  R Site 6: Neg L Site 6: Neg     Image components are not supported.   Image components are not supported. Image components are not supported.  T[place] Right vibratory: present Left vibratory: present  Comments Transmetatarsal amputation noted RLE.  Edema present RLE secondary to DVT currently being addressed.     Lab Results  Component Value Date   HGBA1C 5.9 (H) 01/28/2019   ADA Risk Categorization: High Risk  Patient has one or more of the following: Loss of protective sensation Absent pedal pulses Severe Foot deformity History of foot ulcer  Assessment: 1. Onychomycosis   2. Callus   3. History of transmetatarsal amputation of foot (HCC)   4. Diabetic peripheral neuropathy associated with type 2 diabetes mellitus (HCC)   5. Encounter for diabetic foot exam (HCC)     Plan: Orders Placed This Encounter  Procedures   For home use only DME Other see comment    Dispense one pair extra depth shoes. 3 total contact insoles for left foot. Offload  callus submet head 5 left foot. Toe filler for right foot. Offload callus submet head 5 right foot.    Length of Need:   12 Months   FOR HOME USE ONLY DME OTHER SEE COMMENT  -Patient was evaluated today. All questions/concerns addressed on today's visit. -Diabetic foot examination performed today. -Continue diabetic foot care principles: inspect feet daily, monitor glucose as recommended by PCP and/or Endocrinologist, and follow prescribed diet per PCP, Endocrinologist and/or dietician. -Patient to schedule appointment with Pedorthist for diabetic shoe measurements. -Toenails were debrided in length and girth 1-5 left foot with sterile nail nippers and dremel without iatrogenic bleeding.  -Callus(es) submet head 5 b/l pared utilizing sterile scalpel blade without complication or incident. Total number debrided =2. -Patient/POA to call should there be question/concern in the interim. Return in about 10 weeks (around 10/24/2023).  Delon LITTIE Merlin, DPM      [place] LOCATION: [address] [place] [address]                   Office [phone]   [facility] LOCATION: [address] [place], [place] [address] Office [phone]

## 2023-08-31 ENCOUNTER — Emergency Department (HOSPITAL_COMMUNITY): Payer: Medicare HMO

## 2023-08-31 ENCOUNTER — Emergency Department (HOSPITAL_COMMUNITY)
Admission: EM | Admit: 2023-08-31 | Discharge: 2023-08-31 | Disposition: A | Discharge status: Home / Self Care | Payer: Medicare HMO | Attending: Emergency Medicine | Admitting: Emergency Medicine

## 2023-08-31 ENCOUNTER — Encounter (HOSPITAL_COMMUNITY): Payer: Self-pay

## 2023-08-31 ENCOUNTER — Other Ambulatory Visit: Payer: Self-pay

## 2023-08-31 DIAGNOSIS — M25461 Effusion, right knee: Secondary | ICD-10-CM | POA: Diagnosis not present

## 2023-08-31 DIAGNOSIS — E1140 Type 2 diabetes mellitus with diabetic neuropathy, unspecified: Secondary | ICD-10-CM | POA: Diagnosis not present

## 2023-08-31 DIAGNOSIS — M1711 Unilateral primary osteoarthritis, right knee: Secondary | ICD-10-CM | POA: Diagnosis not present

## 2023-08-31 DIAGNOSIS — L03115 Cellulitis of right lower limb: Secondary | ICD-10-CM | POA: Insufficient documentation

## 2023-08-31 DIAGNOSIS — C8310 Mantle cell lymphoma, unspecified site: Secondary | ICD-10-CM | POA: Diagnosis not present

## 2023-08-31 DIAGNOSIS — M25561 Pain in right knee: Secondary | ICD-10-CM | POA: Diagnosis present

## 2023-08-31 DIAGNOSIS — E663 Overweight: Secondary | ICD-10-CM | POA: Diagnosis not present

## 2023-08-31 DIAGNOSIS — E1122 Type 2 diabetes mellitus with diabetic chronic kidney disease: Secondary | ICD-10-CM | POA: Diagnosis not present

## 2023-08-31 DIAGNOSIS — F1021 Alcohol dependence, in remission: Secondary | ICD-10-CM | POA: Diagnosis not present

## 2023-08-31 DIAGNOSIS — D8481 Immunodeficiency due to conditions classified elsewhere: Secondary | ICD-10-CM | POA: Diagnosis not present

## 2023-08-31 DIAGNOSIS — H269 Unspecified cataract: Secondary | ICD-10-CM | POA: Diagnosis not present

## 2023-08-31 DIAGNOSIS — R609 Edema, unspecified: Secondary | ICD-10-CM | POA: Diagnosis not present

## 2023-08-31 DIAGNOSIS — Z0001 Encounter for general adult medical examination with abnormal findings: Secondary | ICD-10-CM | POA: Diagnosis not present

## 2023-08-31 DIAGNOSIS — Z7901 Long term (current) use of anticoagulants: Secondary | ICD-10-CM | POA: Diagnosis not present

## 2023-08-31 DIAGNOSIS — L0390 Cellulitis, unspecified: Secondary | ICD-10-CM | POA: Diagnosis not present

## 2023-08-31 DIAGNOSIS — Z89421 Acquired absence of other right toe(s): Secondary | ICD-10-CM | POA: Diagnosis not present

## 2023-08-31 LAB — COMPREHENSIVE METABOLIC PANEL
ALT: 19 U/L (ref 0–44)
AST: 17 U/L (ref 15–41)
Albumin: 3.8 g/dL (ref 3.5–5.0)
Alkaline Phosphatase: 45 U/L (ref 38–126)
Anion gap: 9 (ref 5–15)
BUN: 22 mg/dL (ref 8–23)
CO2: 25 mmol/L (ref 22–32)
Calcium: 9.7 mg/dL (ref 8.9–10.3)
Chloride: 103 mmol/L (ref 98–111)
Creatinine, Ser: 1.21 mg/dL (ref 0.61–1.24)
GFR, Estimated: >60 mL/min (ref >60)
Glucose, Bld: 104 mg/dL — ABNORMAL HIGH (ref 70–99)
Potassium: 4.6 mmol/L (ref 3.5–5.1)
Sodium: 137 mmol/L (ref 135–145)
Total Bilirubin: 1 mg/dL (ref 0.0–1.2)
Total Protein: 6.7 g/dL (ref 6.5–8.1)

## 2023-08-31 LAB — CBC WITH DIFFERENTIAL/PLATELET
Abs Immature Granulocytes: 0.06 10*3/uL (ref 0.00–0.07)
Basophils Absolute: 0 10*3/uL (ref 0.0–0.1)
Basophils Relative: 0 %
Eosinophils Absolute: 0.1 10*3/uL (ref 0.0–0.5)
Eosinophils Relative: 1 %
HCT: 46.8 % (ref 39.0–52.0)
Hemoglobin: 15.3 g/dL (ref 13.0–17.0)
Immature Granulocytes: 1 %
Lymphocytes Relative: 18 %
Lymphs Abs: 1.7 10*3/uL (ref 0.7–4.0)
MCH: 31.9 pg (ref 26.0–34.0)
MCHC: 32.7 g/dL (ref 30.0–36.0)
MCV: 97.5 fL (ref 80.0–100.0)
Monocytes Absolute: 0.9 10*3/uL (ref 0.1–1.0)
Monocytes Relative: 10 %
Neutro Abs: 6.4 10*3/uL (ref 1.7–7.7)
Neutrophils Relative %: 70 %
Platelets: 197 10*3/uL (ref 150–400)
RBC: 4.8 MIL/uL (ref 4.22–5.81)
RDW: 14.6 % (ref 11.5–15.5)
WBC: 9.1 10*3/uL (ref 4.0–10.5)
nRBC: 0 % (ref 0.0–0.2)

## 2023-08-31 LAB — I-STAT CG4 LACTIC ACID, ED: Lactic Acid, Venous: 0.9 mmol/L (ref 0.5–1.9)

## 2023-08-31 MED ORDER — DOXYCYCLINE HYCLATE 100 MG PO CAPS: 100.0000 mg | ORAL_CAPSULE | Freq: Two times a day (BID) | ORAL | 0 refills | Status: AC

## 2023-08-31 MED ORDER — DOXYCYCLINE HYCLATE 100 MG PO TABS
100.0000 mg | ORAL_TABLET | Freq: Once | ORAL | Status: AC
  Administered 2023-08-31: 100 mg via ORAL
  Filled 2023-08-31: qty 1

## 2023-08-31 MED ORDER — MORPHINE SULFATE (PF) 4 MG/ML IV SOLN
4.0000 mg | Freq: Once | INTRAVENOUS | Status: AC
  Administered 2023-08-31: 4 mg via INTRAVENOUS
  Filled 2023-08-31: qty 1

## 2023-08-31 MED ORDER — HYDROCODONE-ACETAMINOPHEN 5-325 MG PO TABS: 1.0000 | ORAL_TABLET | ORAL | 0 refills | Status: AC | PRN

## 2023-08-31 NOTE — ED Provider Notes (Signed)
[place] EMERGENCY DEPARTMENT AT [facility] Provider Note   CSN: [id number] Arrival date & time: 08/31/23  1020     History  Chief Complaint  Patient presents with   Knee Pain    Cory Marks is a 63 y.o. male.  HPI 63 year old male presents with right knee redness.  Patient fell last week and scraped his right knee.  He has been having right knee pain ever since.  Went to his doctor today where it was noticed that his leg was red and he was sent here for evaluation.  He has not had any fevers but his leg has been warm and painful.  He has no decreased range of motion of his knee.  He used to be on Eliquis but has not taken it for about a month.  He has some chronic swelling to that right leg due to a prior DVT but it is unchanged how swollen it is currently.  Has chronic neuropathy in his right foot but this is unchanged from baseline.  Patient also endorses that when he fell he injured his right shoulder but it is more just sore like a toothache than particularly painful.  Home Medications Prior to Admission medications   Medication Sig Start Date End Date Taking? Authorizing Provider  doxycycline (VIBRAMYCIN) 100 MG capsule Take 1 capsule (100 mg total) by mouth 2 (two) times daily. One po bid x 7 days 08/31/23  Yes Pricilla Loveless, MD  HYDROcodone-acetaminophen (NORCO) 5-325 MG tablet Take 1 tablet by mouth every 4 (four) hours as needed. 08/31/23  Yes Pricilla Loveless, MD  amLODipine (NORVASC) 10 MG tablet Take 1 tablet (10 mg total) by mouth every morning. Patient taking differently: Take 10 mg by mouth daily. 07/04/17   Massie Maroon, FNP  APIXABAN Everlene Balls) VTE STARTER PACK (10MG  AND 5MG ) Take as directed on package: start with two-5mg  tablets twice daily for 7 days. On day 8, switch to one-5mg  tablet twice daily. 06/02/23   Karmen Stabs, MD  atorvastatin (LIPITOR) 20 MG tablet Take 20 mg by mouth daily. 07/10/19   [provider]  dorzolamide  (TRUSOPT) 2 % ophthalmic solution Place 1 drop into the left eye 2 (two) times daily. 03/03/21   [provider]  ELIQUIS 5 MG TABS tablet Take 5 mg by mouth 2 (two) times daily. 06/19/23   [provider]  furosemide (LASIX) 20 MG tablet Take 20 mg by mouth daily. 06/02/23   [provider]  gabapentin (NEURONTIN) 300 MG capsule Take 300-600 mg by mouth See admin instructions. Taking 1 capsule (300mg ) in the Am & PM and 2 capsules (600mg ) at bedtime 03/12/19   [provider]  lisinopril (ZESTRIL) 5 MG tablet Take 5 mg by mouth daily.  05/08/19   [provider]  Nutritional Supplements (IMMUNE ENHANCE) TABS Take 1 tablet by mouth daily.    [provider]  pantoprazole (PROTONIX) 40 MG tablet Take 40 mg by mouth daily. 05/07/21   [provider]  penicillin v potassium (VEETID) 500 MG tablet Take 500 mg by mouth 2 (two) times daily.    [provider]  pyridOXINE (VITAMIN B-6) 100 MG tablet Take 100 mg by mouth daily.    [provider]  rosuvastatin (CRESTOR) 40 MG tablet Take 40 mg by mouth daily. 02/10/22   [provider]  timolol (TIMOPTIC) 0.5 % ophthalmic solution Place 1 drop into the left eye 2 (two) times daily.  11/08/19   [provider]      Allergies    Oxycodone    Review of Systems   Review of Systems  Constitutional:  Negative for fever.  Musculoskeletal:  Positive for arthralgias and joint swelling.  Skin:  Positive for color change.    Physical Exam Updated Vital Signs BP (!) 118/96 (BP Location: Right Arm)   Pulse 80   Temp 99 F (37.2 C) (Oral)   Resp 17   SpO2 96%  Physical Exam Vitals and nursing note reviewed.  Constitutional:      Appearance: He is well-developed.  HENT:     Head: Normocephalic and atraumatic.  Cardiovascular:     Rate and Rhythm: Normal rate and regular rhythm.     Pulses:          Radial pulses are 2+ on the right side.       Dorsalis pedis  pulses are 2+ on the right side.  Pulmonary:     Effort: Pulmonary effort is normal.  Musculoskeletal:     Right shoulder: No tenderness. Normal range of motion.     Right knee: Erythema present. No effusion. Normal range of motion. Tenderness present.     Right lower leg: Swelling present. No tenderness.     Comments: See picture. There is a superfical wound just below the right knee. The right anterior knee is diffusely erythematous and warm, without obvious effusion.   Skin:    General: Skin is warm and dry.  Neurological:     Mental Status: He is alert.     ED Results / Procedures / Treatments   Labs (all labs ordered are listed, but only abnormal results are displayed) Labs Reviewed  COMPREHENSIVE METABOLIC PANEL - Abnormal; Notable for the following components:      Result Value   Glucose, Bld 104 (*)    All other components within normal limits  CULTURE, BLOOD (ROUTINE X 2)  CULTURE, BLOOD (ROUTINE X 2)  CBC WITH DIFFERENTIAL/PLATELET  I-STAT CG4 LACTIC ACID, ED    EKG None  Radiology DG Knee Complete 4 Views Right Result Date: 08/31/2023 CLINICAL DATA:  Right knee cellulitis. EXAM: RIGHT KNEE - COMPLETE 4+ VIEW COMPARISON:  Lower leg radiographs 04/09/2010. FINDINGS: The mineralization and alignment are normal. There is no evidence of acute fracture or dislocation. Stable remote postsurgical changes with metallic staples laterally in the distal femur, proximal tibia and proximal fibula. There are progressive tricompartmental degenerative changes with joint space narrowing, osteophytes and meniscal chondrocalcinosis. There is a small knee joint effusion with increased prepatellar soft tissue swelling. No soft tissue emphysema or new foreign body identified. No evidence of osteomyelitis. IMPRESSION: 1. Increased prepatellar soft tissue swelling and small knee joint effusion. No evidence of osteomyelitis or soft tissue emphysema. 2. Progressive tricompartmental degenerative  changes with meniscal chondrocalcinosis. Electronically Signed   By: Carey Bullocks M.D.   On: 08/31/2023 12:21    Procedures Procedures    Medications Ordered in ED Medications  morphine (PF) 4 MG/ML injection 4 mg (4 mg Intravenous Given 08/31/23 1233)  doxycycline (VIBRA-TABS) tablet 100 mg (100 mg Oral Given 08/31/23 1357)    ED Course/ Medical Decision Making/ A&P                                 Medical Decision Making Amount and/or Complexity of Data Reviewed Labs: ordered.    Details: Normal WBC, normal lactate. Radiology: ordered and independent  interpretation performed.    Details: No fracture  Risk Prescription drug management.   Patient presents with what appears to be a soft tissue infection over his knee.  He has full range of motion of his knee and has no systemic symptoms and is well-appearing.  My suspicion of a septic joint is quite low.  He has known arthritis in that knee.  There appears to be a potential small joint effusion but again, clinically I think septic arthritis is quite unlikely.  I think this is all superficial on the skin.  His PCP had already prescribed him doxycycline (he thinks this was sent in) but have also referred him here for evaluation.  I think doxycycline is reasonable, but at this point with unremarkable vitals, no signs or symptoms of sepsis, I do not think IV antibiotics are needed.  Will discharge home with return precautions.        Final Clinical Impression(s) / ED Diagnoses Final diagnoses:  Cellulitis of right knee    Rx / DC Orders ED Discharge Orders          Ordered    HYDROcodone-acetaminophen (NORCO) 5-325 MG tablet  Every 4 hours PRN        08/31/23 1349    doxycycline (VIBRAMYCIN) 100 MG capsule  2 times daily        08/31/23 1349              Pricilla Loveless, MD 08/31/23 1541

## 2023-08-31 NOTE — ED Triage Notes (Signed)
Biba from UC. Larey Seat a week ago. R knee a swollen, stiff. UC thought he may have cellulitis. Sent to be evaluated. On Eliquis

## 2023-08-31 NOTE — Discharge Instructions (Signed)
We are treating you for a skin infection with antibiotics.  We are also giving you hydrocodone to be used if you have severe pain.  You can otherwise use Tylenol for pain.  You can apply ice and elevate your leg to help with swelling.  If you develop fever, new or worsening pain, pain or trouble bending and straightening your leg, or any other new/concerning symptoms then return to the ER or call 911.  Otherwise, follow-up closely with your primary care physician to get the area rechecked early next week.

## 2023-09-05 LAB — CULTURE, BLOOD (ROUTINE X 2)
Culture: NO GROWTH
Culture: NO GROWTH

## 2023-10-16 ENCOUNTER — Ambulatory Visit: Payer: Medicare HMO

## 2023-10-16 DIAGNOSIS — L84 Corns and callosities: Secondary | ICD-10-CM

## 2023-10-16 DIAGNOSIS — E1142 Type 2 diabetes mellitus with diabetic polyneuropathy: Secondary | ICD-10-CM

## 2023-10-16 DIAGNOSIS — M2141 Flat foot [pes planus] (acquired), right foot: Secondary | ICD-10-CM

## 2023-10-16 DIAGNOSIS — Z89439 Acquired absence of unspecified foot: Secondary | ICD-10-CM

## 2023-10-17 ENCOUNTER — Other Ambulatory Visit (HOSPITAL_COMMUNITY): Payer: Self-pay

## 2023-10-17 NOTE — Progress Notes (Signed)
 Patient presents to the office today for diabetic shoe and insole measuring.  Patient was measured with brannock device to determine size and width for 1 pair of extra depth shoes and foam casted for 3 pair of insoles.   Documentation of medical necessity will be sent to patient's treating diabetic doctor to verify and sign.   Patient's diabetic provider: Sharlett Iles MD  Shoes and insoles will be ordered at that time and patient will be notified for an appointment for fitting when they arrive.   Shoe size (per patient): 13 Brannock measurement: 13W Shoe choice:   484 / [id number] / [id number] Shoe size ordered: 13WD  Needs to Signed ABN and ppw

## 2023-10-18 ENCOUNTER — Other Ambulatory Visit (HOSPITAL_COMMUNITY): Payer: Self-pay

## 2023-10-18 DIAGNOSIS — Z961 Presence of intraocular lens: Secondary | ICD-10-CM | POA: Diagnosis not present

## 2023-10-18 DIAGNOSIS — H4052X3 Glaucoma secondary to other eye disorders, left eye, severe stage: Secondary | ICD-10-CM | POA: Diagnosis not present

## 2023-10-18 DIAGNOSIS — H2511 Age-related nuclear cataract, right eye: Secondary | ICD-10-CM | POA: Diagnosis not present

## 2023-10-19 DIAGNOSIS — E1122 Type 2 diabetes mellitus with diabetic chronic kidney disease: Secondary | ICD-10-CM | POA: Diagnosis not present

## 2023-10-19 DIAGNOSIS — E663 Overweight: Secondary | ICD-10-CM | POA: Diagnosis not present

## 2023-10-19 DIAGNOSIS — S46001S Unspecified injury of muscle(s) and tendon(s) of the rotator cuff of right shoulder, sequela: Secondary | ICD-10-CM | POA: Diagnosis not present

## 2023-10-19 DIAGNOSIS — Z1159 Encounter for screening for other viral diseases: Secondary | ICD-10-CM | POA: Diagnosis not present

## 2023-10-19 DIAGNOSIS — E1140 Type 2 diabetes mellitus with diabetic neuropathy, unspecified: Secondary | ICD-10-CM | POA: Diagnosis not present

## 2023-10-19 DIAGNOSIS — E785 Hyperlipidemia, unspecified: Secondary | ICD-10-CM | POA: Diagnosis not present

## 2023-10-19 DIAGNOSIS — F1290 Cannabis use, unspecified, uncomplicated: Secondary | ICD-10-CM | POA: Diagnosis not present

## 2023-10-19 DIAGNOSIS — I130 Hypertensive heart and chronic kidney disease with heart failure and stage 1 through stage 4 chronic kidney disease, or unspecified chronic kidney disease: Secondary | ICD-10-CM | POA: Diagnosis not present

## 2023-10-19 DIAGNOSIS — M19111 Post-traumatic osteoarthritis, right shoulder: Secondary | ICD-10-CM | POA: Diagnosis not present

## 2023-10-19 DIAGNOSIS — Z79899 Other long term (current) drug therapy: Secondary | ICD-10-CM | POA: Diagnosis not present

## 2023-10-19 DIAGNOSIS — F320 Major depressive disorder, single episode, mild: Secondary | ICD-10-CM | POA: Diagnosis not present

## 2023-12-06 ENCOUNTER — Ambulatory Visit (INDEPENDENT_AMBULATORY_CARE_PROVIDER_SITE_OTHER): Payer: Medicare HMO | Admitting: Podiatry

## 2023-12-06 ENCOUNTER — Encounter: Payer: Self-pay | Admitting: Podiatry

## 2023-12-06 DIAGNOSIS — B351 Tinea unguium: Secondary | ICD-10-CM | POA: Diagnosis not present

## 2023-12-06 DIAGNOSIS — E1142 Type 2 diabetes mellitus with diabetic polyneuropathy: Secondary | ICD-10-CM | POA: Diagnosis not present

## 2023-12-06 DIAGNOSIS — L84 Corns and callosities: Secondary | ICD-10-CM

## 2023-12-06 DIAGNOSIS — Z89439 Acquired absence of unspecified foot: Secondary | ICD-10-CM | POA: Diagnosis not present

## 2023-12-11 NOTE — Progress Notes (Signed)
  Subjective:  Patient ID: Cory Marks, male    DOB: 12/05/1960,  MRN: [id number]  Cory Marks presents to clinic today for at risk foot care. Patient has h/o NIDDM, neuropathy with amputation of Transmetatarsal amputation right foot  Chief Complaint  Patient presents with   ROUTINE FOOT CARE    RM 17/ diabetic/ diet control/ Dr. Bernetta Brilliant last visit/ left foot amputation 1-5   New problem(s): None.   PCP is Maryellen Snare, NP.  Allergies  Allergen Reactions   Oxycodone  Itching and Palpitations    Review of Systems: Negative except as noted in the HPI.  Objective: No changes noted in today's physical examination. There were no vitals filed for this visit. Cory Marks is a pleasant 63 y.o. male WD, WN in NAD. AAO x 3.  Vascular Examination: Capillary refill time immediate b/l. Vascular status intact b/l with palpable pedal pulses. Pedal hair absent b/l. No pain with calf compression b/l. Skin temperature gradient WNL b/l. No cyanosis or clubbing b/l. No ischemia or gangrene noted b/l. No edema noted b/l LE.  Neurological Examination: Pt has subjective symptoms of neuropathy. Protective sensation diminished with 10g monofilament b/l.  Dermatological Examination: Pedal skin with normal turgor, texture and tone b/l.  No open wounds. No interdigital macerations.   Toenails 1-5 left thick, discolored, elongated with subungual debris and pain on dorsal palpation.   Hyperkeratotic lesion(s) submet head 5 b/l.  No erythema, no edema, no drainage, no fluctuance.  Musculoskeletal Examination: Muscle strength 5/5 to all lower extremity muscle groups bilaterally. Lower extremity amputation(s): Transmetatarsal amputation right foot.  Radiographs: None  Assessment/Plan: 1. Onychomycosis   2. Callus   3. History of transmetatarsal amputation of foot (HCC)   4. Diabetic peripheral neuropathy associated with type 2 diabetes mellitus (HCC)    -Patient was evaluated today. All  questions/concerns addressed on today's visit. -Patient to continue soft, supportive shoe gear daily. -Toenails were debrided in length and girth 1-5 left foot with sterile nail nippers and dremel without iatrogenic bleeding.  -Callus(es) submet head 5 b/l pared utilizing sterile scalpel blade without complication or incident. Total number debrided =2. -Patient/POA to call should there be question/concern in the interim.   Return in about 3 months (around 03/06/2024).  [name], DPM      [place] LOCATION: [address], [place] [address]                   Office [phone]   [facility] LOCATION: [address] [place], [place] [address] Office [phone]

## 2024-02-14 ENCOUNTER — Ambulatory Visit

## 2024-02-14 DIAGNOSIS — Z89439 Acquired absence of unspecified foot: Secondary | ICD-10-CM

## 2024-02-14 DIAGNOSIS — M2141 Flat foot [pes planus] (acquired), right foot: Secondary | ICD-10-CM | POA: Diagnosis not present

## 2024-02-14 DIAGNOSIS — E1142 Type 2 diabetes mellitus with diabetic polyneuropathy: Secondary | ICD-10-CM

## 2024-02-14 DIAGNOSIS — L84 Corns and callosities: Secondary | ICD-10-CM

## 2024-02-14 NOTE — Progress Notes (Addendum)
 Patient presents today to pick up diabetic shoes and insoles.  Patient was dispensed Dm shoes and Right toefiller and 3 custom inserts for left foot  Inserts provide support and protection to plantar area of foot and accommodate BIL Foot deformity  Toe fill will help prevent shoe breakdown as well as help to restore balance and symmetry  Patient will be talking to Dr May next visit in August about us  making him an AFO brace for Left ankle instability   Fit was satisfactory. Instructions for break-in and wear was reviewed and a copy was given to the patient.   Re-appointment for regularly scheduled diabetic foot care visits or if they should experience any trouble with the shoes or insoles.  Lolita Schultze Cped CFo CFm Auth request sent to [facility] for [employer] / 1ea

## 2024-03-12 ENCOUNTER — Ambulatory Visit (INDEPENDENT_AMBULATORY_CARE_PROVIDER_SITE_OTHER): Admitting: Podiatry

## 2024-03-12 DIAGNOSIS — M79674 Pain in right toe(s): Secondary | ICD-10-CM

## 2024-03-12 DIAGNOSIS — M79675 Pain in left toe(s): Secondary | ICD-10-CM | POA: Diagnosis not present

## 2024-03-12 DIAGNOSIS — Z89439 Acquired absence of unspecified foot: Secondary | ICD-10-CM

## 2024-03-12 DIAGNOSIS — M21371 Foot drop, right foot: Secondary | ICD-10-CM | POA: Diagnosis not present

## 2024-03-12 DIAGNOSIS — M2142 Flat foot [pes planus] (acquired), left foot: Secondary | ICD-10-CM

## 2024-03-12 DIAGNOSIS — M2141 Flat foot [pes planus] (acquired), right foot: Secondary | ICD-10-CM

## 2024-03-12 DIAGNOSIS — B351 Tinea unguium: Secondary | ICD-10-CM | POA: Diagnosis not present

## 2024-03-12 DIAGNOSIS — Z91198 Patient's noncompliance with other medical treatment and regimen for other reason: Secondary | ICD-10-CM

## 2024-03-12 DIAGNOSIS — E1142 Type 2 diabetes mellitus with diabetic polyneuropathy: Secondary | ICD-10-CM

## 2024-03-12 NOTE — Progress Notes (Signed)
 Subjective: 63 year old male presents the office today for concerns of thick, elongated nails is not able to trim himself.  No open lesions he reports.  He is asked me to place on the right side given dropfoot.  He has a brace previously but not anytime recently.  He is not currently on any blood thinners.   Objective: AAO x3, NAD DP/PT pulses palpable bilaterally, CRT less than 3 seconds Sensation decreased to [facility] monofilament. Dropfoot present on the right side. No ulcerations of the right foot.  Transmetatarsal amputation on the right. Nails are hypertrophic, dystrophic, brittle, discolored, elongated 45. No surrounding redness or drainage. Tenderness nails 1-5 on the left. No open lesions or pre-ulcerative lesions are identified today. Flatfoot is present  No pain with calf compression, swelling, warmth, erythema  Assessment: 63 year old male symptomatic onychosis, right dropfoot  Plan: Symptomatic onychomycosis - Sharply debrided toenails x 5 on left  Right dropfoot -I do think he benefit from an AFO brace.  Will have him follow-up with Sueanne, pedorthist for this.   Return for Dropfoot brace with Lolita, Dr. Gaynel in 3 months for nail trim/foot exam .  Cory Marks DPM

## 2024-03-13 NOTE — Progress Notes (Signed)
 1. Failure to attend appointment with reason given    Appt canceled by clinic. Power outage in office.

## 2024-03-22 ENCOUNTER — Ambulatory Visit

## 2024-03-26 ENCOUNTER — Other Ambulatory Visit

## 2024-04-05 NOTE — Progress Notes (Signed)
 Patient was present and casted for Right foot balance brace  Brace will help increase toe off / propulsion with Right Dropfoot, will help control ankle in sagittal plane increasing support and balance helping to reduce risk of fall Patient will return for fitting once in  Cast to be scanned and order sent to [place]  for [place] balance brace / color sand   Lolita Schultze Cped, CFo, CFm

## 2024-05-02 ENCOUNTER — Ambulatory Visit (INDEPENDENT_AMBULATORY_CARE_PROVIDER_SITE_OTHER)

## 2024-05-02 DIAGNOSIS — M21371 Foot drop, right foot: Secondary | ICD-10-CM

## 2024-05-02 DIAGNOSIS — M2141 Flat foot [pes planus] (acquired), right foot: Secondary | ICD-10-CM | POA: Diagnosis not present

## 2024-05-02 DIAGNOSIS — Z89439 Acquired absence of unspecified foot: Secondary | ICD-10-CM

## 2024-05-02 NOTE — Progress Notes (Signed)
 Patient presents today to pick up custom molded Balance, diagnosed with Right Foot Drop and isntability  Orthosis was dispensed and fit was good patient was very happy with function. Brace adds support and stability to ankle and will also help to slow progression of ankle deformity and increase balance to help reduce falls  Patient reviewed with me instructions for break-in and wear. Written instructions given to patient.  Patient will follow up as needed.   Lolita Schultze Cped, CFo, CFm

## 2024-05-20 ENCOUNTER — Other Ambulatory Visit: Payer: Self-pay | Admitting: Family Medicine

## 2024-05-20 DIAGNOSIS — L0211 Cutaneous abscess of neck: Secondary | ICD-10-CM

## 2024-05-23 ENCOUNTER — Ambulatory Visit
Admission: RE | Admit: 2024-05-23 | Discharge: 2024-05-23 | Disposition: A | Discharge status: Home / Self Care | Source: Ambulatory Visit | Attending: Family Medicine | Admitting: Family Medicine

## 2024-05-23 DIAGNOSIS — L0211 Cutaneous abscess of neck: Secondary | ICD-10-CM

## 2024-06-18 ENCOUNTER — Telehealth: Payer: Self-pay

## 2024-06-18 NOTE — Telephone Encounter (Signed)
 Patient called in to say that the Brace that was fitted on 03/22/2024. Saying brace is causing irritation and pain. Is this a brace that a nurse/cma can adjust or would the patient need an appointment with you?

## 2024-06-20 ENCOUNTER — Ambulatory Visit

## 2024-06-20 NOTE — Progress Notes (Signed)
 Patient was in with edema in Right LE severe, skin broke open and is seeping approx 2 below knee cap, Brace height is approx 3: below wound  Patient states this has been present for about a month now, I added antibiotic cream and dressed the wound told patient to call PCP and try to get in asap as he has hx of amputation.  Advised not to wear brace until healed  Also showed him website for circaid compression wraps to help reduce edema as he cannot tolerate compression stockings   [email or web address]  Lolita Schultze CPed, CFo, CFm

## 2024-06-26 ENCOUNTER — Ambulatory Visit (INDEPENDENT_AMBULATORY_CARE_PROVIDER_SITE_OTHER): Admitting: Podiatry

## 2024-06-26 ENCOUNTER — Encounter: Payer: Self-pay | Admitting: Podiatry

## 2024-06-26 DIAGNOSIS — Z89439 Acquired absence of unspecified foot: Secondary | ICD-10-CM | POA: Diagnosis not present

## 2024-06-26 DIAGNOSIS — E1142 Type 2 diabetes mellitus with diabetic polyneuropathy: Secondary | ICD-10-CM

## 2024-06-26 DIAGNOSIS — B351 Tinea unguium: Secondary | ICD-10-CM

## 2024-07-05 NOTE — Progress Notes (Signed)
  Subjective:  Patient ID: Cory Marks, male    DOB: 1961-03-11,  MRN: [id number]  Cory Marks presents to clinic today for at risk foot care. Patient has h/o NIDDM, neuropathy with amputation of Transmetatarsal amputation right foot and painful, discolored, thick toenails which interfere with daily activities  Chief Complaint  Patient presents with   RFC     RFC Non diabetic toenail trim. LOV 06/24/24.   New problem(s): None.   PCP is Campbell Reynolds, NP.  Allergies  Allergen Reactions   Oxycodone  Itching and Palpitations    Review of Systems: Negative except as noted in the HPI.  Objective: No changes noted in today's physical examination. There were no vitals filed for this visit. Cory Marks is a pleasant 63 y.o. male WD, WN in NAD. AAO x 3.  Vascular Examination: Capillary refill time immediate b/l. Vascular status intact b/l with palpable pedal pulses. Pedal hair absent b/l. No pain with calf compression b/l. Skin temperature gradient WNL b/l. No cyanosis or clubbing b/l. No ischemia or gangrene noted b/l. No edema noted b/l LE.  Neurological Examination: Pt has subjective symptoms of neuropathy. Protective sensation diminished with 10g monofilament b/l.  Dermatological Examination: Pedal skin with normal turgor, texture and tone b/l.  No open wounds. No interdigital macerations.   Toenails 1-5 left thick, discolored, elongated with subungual debris and pain on dorsal palpation.   Hyperkeratotic lesion(s) submet head 5 b/l.  No erythema, no edema, no drainage, no fluctuance.  Musculoskeletal Examination: Muscle strength 5/5 to all lower extremity muscle groups bilaterally. Lower extremity amputation(s): Transmetatarsal amputation right foot.  Radiographs: None  Assessment/Plan: 1. Onychomycosis   2. History of transmetatarsal amputation of foot (HCC)   3. Diabetic peripheral neuropathy associated with type 2 diabetes mellitus [facility])   Consent given for  treatment. Patient examined. All patient's and/or POA's questions/concerns addressed on today's visit. Toenails 1-5 left foot debrided in length and girth without incident. Treatment was provided by assistant Andrez Manchester under my supervision. Continue foot and shoe inspections daily. Monitor blood glucose per PCP/Endocrinologist's recommendations. Continue soft, supportive shoe gear daily. Report any pedal injuries to medical professional. Call office if there are any questions/concerns.  Return in about 3 months (around 09/26/2024).  Delon LITTIE Merlin, DPM      [place] LOCATION: [address], [place] [address]                   Office [phone]   [facility] LOCATION: [address] [place], [place] [address] Office [phone]

## 2024-09-13 ENCOUNTER — Ambulatory Visit: Admission: RE | Admit: 2024-09-13 | Source: Ambulatory Visit

## 2024-09-13 ENCOUNTER — Other Ambulatory Visit: Payer: Self-pay | Admitting: Family Medicine

## 2024-09-13 DIAGNOSIS — R296 Repeated falls: Secondary | ICD-10-CM

## 2024-10-09 ENCOUNTER — Ambulatory Visit: Admitting: Podiatry
